# Patient Record
Sex: Male | Born: 1945 | Race: Black or African American | Hispanic: No | Marital: Single | Smoking: Never smoker
Health system: Southern US, Community
[De-identification: ages and names within clinical notes are randomized; demographics above are authoritative.]

## PROBLEM LIST (undated history)

## (undated) DIAGNOSIS — K922 Gastrointestinal hemorrhage, unspecified: Secondary | ICD-10-CM

## (undated) DIAGNOSIS — F0390 Unspecified dementia without behavioral disturbance: Secondary | ICD-10-CM

## (undated) DIAGNOSIS — E785 Hyperlipidemia, unspecified: Secondary | ICD-10-CM

## (undated) DIAGNOSIS — R4701 Aphasia: Secondary | ICD-10-CM

## (undated) DIAGNOSIS — N400 Enlarged prostate without lower urinary tract symptoms: Secondary | ICD-10-CM

## (undated) DIAGNOSIS — G8190 Hemiplegia, unspecified affecting unspecified side: Secondary | ICD-10-CM

## (undated) DIAGNOSIS — I429 Cardiomyopathy, unspecified: Secondary | ICD-10-CM

## (undated) DIAGNOSIS — I639 Cerebral infarction, unspecified: Secondary | ICD-10-CM

## (undated) DIAGNOSIS — D649 Anemia, unspecified: Secondary | ICD-10-CM

## (undated) DIAGNOSIS — R4702 Dysphasia: Secondary | ICD-10-CM

## (undated) DIAGNOSIS — I10 Essential (primary) hypertension: Secondary | ICD-10-CM

## (undated) DIAGNOSIS — K209 Esophagitis, unspecified without bleeding: Secondary | ICD-10-CM

## (undated) DIAGNOSIS — K219 Gastro-esophageal reflux disease without esophagitis: Secondary | ICD-10-CM

## (undated) DIAGNOSIS — Z978 Presence of other specified devices: Secondary | ICD-10-CM

## (undated) DIAGNOSIS — N1330 Unspecified hydronephrosis: Secondary | ICD-10-CM

## (undated) DIAGNOSIS — I509 Heart failure, unspecified: Secondary | ICD-10-CM

## (undated) DIAGNOSIS — Z8719 Personal history of other diseases of the digestive system: Secondary | ICD-10-CM

## (undated) DIAGNOSIS — E119 Type 2 diabetes mellitus without complications: Secondary | ICD-10-CM

## (undated) DIAGNOSIS — I499 Cardiac arrhythmia, unspecified: Secondary | ICD-10-CM

## (undated) DIAGNOSIS — Z960 Presence of urogenital implants: Secondary | ICD-10-CM

## (undated) HISTORY — PX: PEG PLACEMENT: SHX5437

## (undated) HISTORY — PX: OTHER SURGICAL HISTORY: SHX169

---

## 2004-07-20 ENCOUNTER — Emergency Department (HOSPITAL_COMMUNITY): Admission: EM | Admit: 2004-07-20 | Discharge: 2004-07-21 | Payer: Self-pay | Admitting: Emergency Medicine

## 2004-07-22 ENCOUNTER — Inpatient Hospital Stay (HOSPITAL_COMMUNITY): Admission: EM | Admit: 2004-07-22 | Discharge: 2004-08-02 | Payer: Self-pay | Admitting: Emergency Medicine

## 2006-01-24 ENCOUNTER — Ambulatory Visit: Admission: RE | Admit: 2006-01-24 | Discharge: 2006-01-24 | Payer: Self-pay | Admitting: Internal Medicine

## 2006-01-24 ENCOUNTER — Encounter: Payer: Self-pay | Admitting: Vascular Surgery

## 2006-02-25 ENCOUNTER — Encounter: Payer: Self-pay | Admitting: Vascular Surgery

## 2006-02-25 ENCOUNTER — Ambulatory Visit (HOSPITAL_COMMUNITY): Admission: RE | Admit: 2006-02-25 | Discharge: 2006-02-25 | Payer: Self-pay | Admitting: *Deleted

## 2007-10-06 ENCOUNTER — Emergency Department (HOSPITAL_COMMUNITY): Admission: EM | Admit: 2007-10-06 | Discharge: 2007-10-06 | Payer: Self-pay | Admitting: Emergency Medicine

## 2007-10-10 ENCOUNTER — Inpatient Hospital Stay (HOSPITAL_COMMUNITY): Admission: EM | Admit: 2007-10-10 | Discharge: 2007-10-24 | Payer: Self-pay | Admitting: Emergency Medicine

## 2007-10-20 ENCOUNTER — Ambulatory Visit: Payer: Self-pay | Admitting: Internal Medicine

## 2009-12-10 ENCOUNTER — Emergency Department (HOSPITAL_COMMUNITY): Admission: EM | Admit: 2009-12-10 | Discharge: 2009-12-10 | Payer: Self-pay | Admitting: Emergency Medicine

## 2010-01-09 ENCOUNTER — Observation Stay (HOSPITAL_COMMUNITY): Admission: EM | Admit: 2010-01-09 | Discharge: 2010-01-17 | Payer: Self-pay | Admitting: Emergency Medicine

## 2010-01-15 ENCOUNTER — Encounter (INDEPENDENT_AMBULATORY_CARE_PROVIDER_SITE_OTHER): Payer: Self-pay | Admitting: Internal Medicine

## 2010-01-16 ENCOUNTER — Encounter (INDEPENDENT_AMBULATORY_CARE_PROVIDER_SITE_OTHER): Payer: Self-pay | Admitting: Internal Medicine

## 2010-12-04 LAB — CBC
HCT: 24.3 % — ABNORMAL LOW (ref 39.0–52.0)
HCT: 22.8 % — ABNORMAL LOW (ref 39.0–52.0)
HCT: 23.4 % — ABNORMAL LOW (ref 39.0–52.0)
HCT: 24.5 % — ABNORMAL LOW (ref 39.0–52.0)
HCT: 28.3 % — ABNORMAL LOW (ref 39.0–52.0)
Hemoglobin: 7.6 g/dL — ABNORMAL LOW (ref 13.0–17.0)
MCHC: 32.3 g/dL (ref 30.0–36.0)
MCHC: 32.5 g/dL (ref 30.0–36.0)
MCHC: 31.8 g/dL (ref 30.0–36.0)
MCHC: 31.9 g/dL (ref 30.0–36.0)
MCHC: 32.3 g/dL (ref 30.0–36.0)
MCV: 70.8 fL — ABNORMAL LOW (ref 78.0–100.0)
MCV: 68.8 fL — ABNORMAL LOW (ref 78.0–100.0)
MCV: 68.9 fL — ABNORMAL LOW (ref 78.0–100.0)
MCV: 68.9 fL — ABNORMAL LOW (ref 78.0–100.0)
MCV: 69 fL — ABNORMAL LOW (ref 78.0–100.0)
MCV: 69.1 fL — ABNORMAL LOW (ref 78.0–100.0)
MCV: 69.2 fL — ABNORMAL LOW (ref 78.0–100.0)
MCV: 71 fL — ABNORMAL LOW (ref 78.0–100.0)
Platelets: 235 10*3/uL (ref 150–400)
Platelets: 247 10*3/uL (ref 150–400)
Platelets: 264 10*3/uL (ref 150–400)
Platelets: 235 10*3/uL (ref 150–400)
Platelets: 235 10*3/uL (ref 150–400)
RBC: 3.84 MIL/uL — ABNORMAL LOW (ref 4.22–5.81)
RBC: 3.4 MIL/uL — ABNORMAL LOW (ref 4.22–5.81)
RBC: 3.54 MIL/uL — ABNORMAL LOW (ref 4.22–5.81)
RBC: 3.61 MIL/uL — ABNORMAL LOW (ref 4.22–5.81)
RBC: 3.76 MIL/uL — ABNORMAL LOW (ref 4.22–5.81)
RBC: 3.81 MIL/uL — ABNORMAL LOW (ref 4.22–5.81)
RBC: 3.84 MIL/uL — ABNORMAL LOW (ref 4.22–5.81)
RBC: 4.1 MIL/uL — ABNORMAL LOW (ref 4.22–5.81)
RDW: 17.8 % — ABNORMAL HIGH (ref 11.5–15.5)
RDW: 18.5 % — ABNORMAL HIGH (ref 11.5–15.5)
RDW: 15.7 % — ABNORMAL HIGH (ref 11.5–15.5)
RDW: 15.9 % — ABNORMAL HIGH (ref 11.5–15.5)
WBC: 6.4 10*3/uL (ref 4.0–10.5)
WBC: 5.6 10*3/uL (ref 4.0–10.5)
WBC: 5.7 10*3/uL (ref 4.0–10.5)
WBC: 6.1 10*3/uL (ref 4.0–10.5)
WBC: 7.4 10*3/uL (ref 4.0–10.5)
WBC: 9.3 10*3/uL (ref 4.0–10.5)

## 2010-12-04 LAB — BASIC METABOLIC PANEL
BUN: 10 mg/dL (ref 6–23)
CO2: 23 mEq/L (ref 19–32)
CO2: 26 mEq/L (ref 19–32)
Calcium: 8.3 mg/dL — ABNORMAL LOW (ref 8.4–10.5)
Calcium: 8.6 mg/dL (ref 8.4–10.5)
Chloride: 111 mEq/L (ref 96–112)
Creatinine, Ser: 1.11 mg/dL (ref 0.4–1.5)
GFR calc non Af Amer: >60 mL/min (ref >60)
Glucose, Bld: 105 mg/dL — ABNORMAL HIGH (ref 70–99)
Glucose, Bld: 111 mg/dL — ABNORMAL HIGH (ref 70–99)
Potassium: 3.9 mEq/L (ref 3.5–5.1)
Sodium: 141 mEq/L (ref 135–145)

## 2010-12-04 LAB — CROSSMATCH

## 2010-12-04 LAB — ABO/RH: ABO/RH(D): B POS

## 2010-12-04 LAB — COMPREHENSIVE METABOLIC PANEL
ALT: 10 U/L (ref 0–53)
CO2: 23 mEq/L (ref 19–32)
Calcium: 8.9 mg/dL (ref 8.4–10.5)
Chloride: 109 mEq/L (ref 96–112)
Creatinine, Ser: 1.35 mg/dL (ref 0.4–1.5)
GFR calc non Af Amer: 53 mL/min — ABNORMAL LOW (ref >60)
Glucose, Bld: 124 mg/dL — ABNORMAL HIGH (ref 70–99)
Total Bilirubin: 0.3 mg/dL (ref 0.3–1.2)

## 2010-12-04 LAB — DIFFERENTIAL
Basophils Absolute: 0 10*3/uL (ref 0.0–0.1)
Eosinophils Absolute: 0.1 10*3/uL (ref 0.0–0.7)
Lymphocytes Relative: 8 % — ABNORMAL LOW (ref 12–46)
Lymphs Abs: 0.7 10*3/uL (ref 0.7–4.0)
Monocytes Relative: 8 % (ref 3–12)
Neutro Abs: 7.8 10*3/uL — ABNORMAL HIGH (ref 1.7–7.7)

## 2010-12-04 LAB — TYPE AND SCREEN: ABO/RH(D): B POS

## 2010-12-09 LAB — URINALYSIS, ROUTINE W REFLEX MICROSCOPIC
Bilirubin Urine: NEGATIVE
Glucose, UA: NEGATIVE mg/dL
Ketones, ur: NEGATIVE mg/dL
Leukocytes, UA: NEGATIVE
Protein, ur: NEGATIVE mg/dL

## 2010-12-09 LAB — DIFFERENTIAL
Lymphs Abs: 0.9 10*3/uL (ref 0.7–4.0)
Monocytes Relative: 9 % (ref 3–12)
Neutro Abs: 7 10*3/uL (ref 1.7–7.7)
Neutrophils Relative %: 78 % — ABNORMAL HIGH (ref 43–77)

## 2010-12-09 LAB — COMPREHENSIVE METABOLIC PANEL
BUN: 23 mg/dL (ref 6–23)
Calcium: 8.9 mg/dL (ref 8.4–10.5)
Creatinine, Ser: 1.2 mg/dL (ref 0.4–1.5)
GFR calc non Af Amer: >60 mL/min (ref >60)
Glucose, Bld: 115 mg/dL — ABNORMAL HIGH (ref 70–99)
Total Protein: 7.5 g/dL (ref 6.0–8.3)

## 2010-12-09 LAB — CBC
HCT: 34.3 % — ABNORMAL LOW (ref 39.0–52.0)
Hemoglobin: 11.1 g/dL — ABNORMAL LOW (ref 13.0–17.0)
MCHC: 32.3 g/dL (ref 30.0–36.0)
MCV: 71.3 fL — ABNORMAL LOW (ref 78.0–100.0)
RDW: 15 % (ref 11.5–15.5)

## 2010-12-09 LAB — LIPASE, BLOOD: Lipase: 36 U/L (ref 11–59)

## 2011-01-15 ENCOUNTER — Emergency Department (HOSPITAL_COMMUNITY): Payer: Medicare Other

## 2011-01-15 ENCOUNTER — Emergency Department (HOSPITAL_COMMUNITY)
Admission: EM | Admit: 2011-01-15 | Discharge: 2011-01-15 | Disposition: A | Discharge status: Home / Self Care | Payer: Medicare Other | Attending: Emergency Medicine | Admitting: Emergency Medicine

## 2011-01-15 DIAGNOSIS — Z79899 Other long term (current) drug therapy: Secondary | ICD-10-CM | POA: Insufficient documentation

## 2011-01-15 DIAGNOSIS — E785 Hyperlipidemia, unspecified: Secondary | ICD-10-CM | POA: Insufficient documentation

## 2011-01-15 DIAGNOSIS — F068 Other specified mental disorders due to known physiological condition: Secondary | ICD-10-CM | POA: Insufficient documentation

## 2011-01-15 DIAGNOSIS — K219 Gastro-esophageal reflux disease without esophagitis: Secondary | ICD-10-CM | POA: Insufficient documentation

## 2011-01-15 DIAGNOSIS — R1915 Other abnormal bowel sounds: Secondary | ICD-10-CM | POA: Insufficient documentation

## 2011-01-15 DIAGNOSIS — Z8673 Personal history of transient ischemic attack (TIA), and cerebral infarction without residual deficits: Secondary | ICD-10-CM | POA: Insufficient documentation

## 2011-01-15 DIAGNOSIS — R112 Nausea with vomiting, unspecified: Secondary | ICD-10-CM | POA: Insufficient documentation

## 2011-01-15 DIAGNOSIS — Z7982 Long term (current) use of aspirin: Secondary | ICD-10-CM | POA: Insufficient documentation

## 2011-01-15 DIAGNOSIS — R109 Unspecified abdominal pain: Secondary | ICD-10-CM | POA: Insufficient documentation

## 2011-01-15 LAB — URINALYSIS, ROUTINE W REFLEX MICROSCOPIC
Bilirubin Urine: NEGATIVE
Glucose, UA: NEGATIVE mg/dL
Hgb urine dipstick: NEGATIVE
Ketones, ur: NEGATIVE mg/dL
Protein, ur: NEGATIVE mg/dL
pH: 6 (ref 5.0–8.0)

## 2011-01-15 LAB — COMPREHENSIVE METABOLIC PANEL WITH GFR
ALT: 16 U/L (ref 0–53)
AST: 15 U/L (ref 0–37)
Albumin: 3.5 g/dL (ref 3.5–5.2)
Alkaline Phosphatase: 51 U/L (ref 39–117)
BUN: 17 mg/dL (ref 6–23)
CO2: 19 meq/L (ref 19–32)
Calcium: 9.4 mg/dL (ref 8.4–10.5)
Chloride: 110 meq/L (ref 96–112)
Creatinine, Ser: 1.46 mg/dL (ref 0.4–1.5)
GFR calc non Af Amer: 48 mL/min — ABNORMAL LOW
Glucose, Bld: 126 mg/dL — ABNORMAL HIGH (ref 70–99)
Potassium: 4.3 meq/L (ref 3.5–5.1)
Sodium: 137 meq/L (ref 135–145)
Total Bilirubin: 0.3 mg/dL (ref 0.3–1.2)
Total Protein: 7 g/dL (ref 6.0–8.3)

## 2011-01-15 LAB — CBC
HCT: 39.7 % (ref 39.0–52.0)
Hemoglobin: 12.8 g/dL — ABNORMAL LOW (ref 13.0–17.0)
RDW: 17.7 % — ABNORMAL HIGH (ref 11.5–15.5)
WBC: 7.1 10*3/uL (ref 4.0–10.5)

## 2011-01-15 LAB — OCCULT BLOOD, POC DEVICE: Fecal Occult Bld: NEGATIVE

## 2011-01-29 NOTE — Discharge Summary (Signed)
NAME:  [name], [name]              ACCOUNT NO.:  [id number]   MEDICAL RECORD NO.:  [id number]          PATIENT TYPE:  INP   LOCATION:  5526                         FACILITY:  [facility]   PHYSICIAN:  Lonia Blood, M.D.       DATE OF BIRTH:  June 05, 1946   DATE OF ADMISSION:  10/09/2007  DATE OF DISCHARGE:                               DISCHARGE SUMMARY   INTERIM DISCHARGE SUMMARY   PRIMARY CARE PHYSICIAN:  Dr. Jacalyn Lefevre   DISCHARGE DIAGNOSES:  1. Acute pyelonephritis with Proteus mirabilis and Enterococcus      faecalis.  2. Chronic left hydronephrosis.  3. Nausea and vomiting, recurrent and intractable, unclear etiology.  4. Ileus.  5. Hypertension.  6. History of cerebrovascular accident with chronic right hemiparesis.  7. Hypertension.  8. Hyperlipidemia.  9. Gastroesophageal reflux disease.  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  11.Bilateral lower lobe atelectasis.   DISCHARGE MEDICATIONS AND DISPOSITION:  Will be dictated at the time of  the actual discharge of the patient.   PROCEDURES DURING THIS ADMISSION:  1. October 09, 2007, the patient underwent CT scan of abdomen and      pelvis, findings of left-sided hydronephrosis and left renal      atrophy, bladder outlet obstruction with thickening of the bladder      wall.  2. October 11, 2007, abdominal x-ray, findings of nonspecific bowel-      gas pattern.  3. October 12, 2007, a nuclear medicine gastric emptying scan, mildly      delayed.  4. October 13, 2007, abdominal x-ray with findings of severely dilated      stomach.  5. October 15, 2007, placement of a nasogastric tube with relief of      the gastric distention.  6. October 16, 2007, upper endoscopy by Dr. Virginia Rochester, report pending.   CONSULTATIONS DURING THIS ADMISSION:  The patient was seen in  consultation by Dr. Virginia Rochester from gastroenterology and Dr. Marcine Matar  from urology.   HISTORY AND PHYSICAL:  Refer to  the dictated H&P which was done by Dr.  Christella Noa October 09, 2007.   HOSPITAL COURSE:  1. Acute pyelonephritis in a patient with chronic left hydronephrosis.      Mr. Tyler had urine culture which was positive for Proteus as      well as Enterococcus.  The patient was treated initially      empirically with vancomycin and Rocephin and later, once the      results of the urine culture studies were available, he was      switched to Levaquin intravenously.  The patient is still receiving      this antibiotic at the time of my dictation.  His total planned      treatment for intravenous antibiotics is at least 10 days, out of      which he already has completed 6.  2. Recurrent abdominal distention, nausea, vomiting and gastric      distention.  This has been unclear in etiology.  The patient's  potassium has been always within normal limits.  The patient does      not seem to have a bowel obstruction.  The patient underwent upper      endoscopy which did not reveal any pyloric stenosis.  Currently,      Mr. Curro is on Reglan intravenously around-the-clock and we are      awaiting resolution of his symptoms.  If he does not improve, he      will probably require PEJ placement.  3. History of stroke.  This is a chronic problem for Mr. Lewellyn for      which he is in a skilled nursing facility.  4. Bladder outlet obstruction.  Mr. Keetch had a Foley catheter      placed and Dr. Retta Diones is contemplating the right timing of a      TURP procedure.  The patient's PSA level is pending at the time of      my dictation.      Lonia Blood, M.D.  Electronically Signed     SL/MEDQ  D:  10/16/2007  T:  10/17/2007  Job:  [id number]   cc:   [facility]  Georgiana Spinner, M.D.  Bertram Millard. Dahlstedt, M.D.

## 2011-01-29 NOTE — Op Note (Signed)
NAME:  [name], [name] NO.:  [id number]   MEDICAL RECORD NO.:  [id number]          PATIENT TYPE:  INP   LOCATION:  5526                         FACILITY:  [facility]   PHYSICIAN:  Georgiana Spinner, M.D.    DATE OF BIRTH:  1946-08-19   DATE OF PROCEDURE:  10/16/2007  DATE OF DISCHARGE:                               OPERATIVE REPORT   PROCEDURE:  Upper endoscopy.   INDICATIONS:  Vomiting, question of gastroparesis versus gastric outlet  obstruction.   ANESTHESIA:  Fentanyl 75 mcg, Versed 7.5 mg.   PROCEDURE IN DETAIL:  With the patient mildly sedated in the left  lateral decubitus position the Pentax videoscopic endoscope was inserted  in the mouth, passed under direct vision into the esophagus which  appeared normal, into the stomach which appeared dilated.  The fundus,  body and antrum appeared normal as did the prepyloric area.  We were  able then to advance through the pylorus into the duodenal bulb and  second portion of the duodenum, again both which appeared normal.  From  this point the endoscope was then slowly withdrawn taking  circumferential views of duodenal mucosa until the endoscope had been  pulled back into the stomach, placed in retroflexion to view the stomach  from below.  The endoscope was straightened and withdrawn putting  pressure on the NG tube to remain in place as we withdrew the endoscope.  The patient's vital signs, pulse oximeter remained stable.  The patient  tolerated the procedure well without apparent complication.   FINDINGS:  Negative examination indicating that the appearance of  gastric outlet obstruction is most likely due to gastroparesis.   PLAN:  Will get an x-ray to make sure NG tube remains in good position.  Increase dose of metoclopramide and consider further therapies depending  on the patient's clinical response.           ______________________________  Georgiana Spinner, M.D.     GMO/MEDQ  D:  10/16/2007  T:   10/16/2007  Job:  [id number]

## 2011-01-29 NOTE — Discharge Summary (Signed)
NAME:  [name], [name]              ACCOUNT NO.:  [id number]   MEDICAL RECORD NO.:  [id number]          PATIENT TYPE:  INP   LOCATION:  5526                         FACILITY:  [facility]   PHYSICIAN:  Lonia Blood, M.D.      DATE OF BIRTH:  1946/03/12   DATE OF ADMISSION:  10/09/2007  DATE OF DISCHARGE:  10/24/2007                               DISCHARGE SUMMARY   ADDENDUM   PRIMARY CARE PHYSICIAN:  Immunologist Care, Dr. Hyacinth Meeker.   Please refer to dictated discharge summary by Dr. Lonia Blood on October 16, 2007.   DISCHARGE MEDICATIONS:  1. Aspirin 81 mg daily.  2. Chlorpromazine 25 mg p.o. t.i.d.  3. Norvasc 10 mg daily.  4. Labetalol 200 mg p.o. b.i.d.  5. Niacin 500 mg p.o. q.h.s.  6. Resource nutritional supplement 240 mg t.i.d.  7. Zantac 150 mg p.o. b.i.d.   DISPOSITION:  The patient will be transferred to skilled facility today.  He is tolerating diet well.  He has some baseline stool incontinence but  that seems to be chronic.  He is otherwise medically stable and no  complaints.  His ileus was persistent and the patient was deemed to have  been discharged 2 days ago but continued to have food intolerance.  Today, however, he is tolerating a mechanical soft diet which he should  continue with in the nursing home.      Lonia Blood, M.D.  Electronically Signed     LG/MEDQ  D:  10/24/2007  T:  10/24/2007  Job:  [id number]

## 2011-01-29 NOTE — H&P (Signed)
NAME:  [name], [name] NO.:  [id number]   MEDICAL RECORD NO.:  [id number]          PATIENT TYPE:  INP   LOCATION:  5524                         FACILITY:  [facility]   PHYSICIAN:  Herbie Saxon, MDDATE OF BIRTH:  1945/11/03   DATE OF ADMISSION:  10/09/2007  DATE OF DISCHARGE:                              HISTORY & PHYSICAL   PRIMARY CARE PHYSICIAN:  Dr. Jacalyn Lefevre   NURSING HOME PHYSICIAN:  Dr. Corine Shelter   GASTROENTEROLOGIST:  Dr. Virginia Rochester   UROLOGIST:  Dr. Retta Diones   PRIMARY CAREGIVER:  Meriam Sprague  phone number [phone].   The patient is a full code.   PRESENTING COMPLAINT:  Nausea and vomiting 3 days.   HISTORY OF PRESENTING COMPLAINT:  This is a 65 year old African American  male who is a nursing home resident at [facility] who has been  noticing to be having continuous nausea and vomiting in the last 3 days  associated with cough productive of yellowish-greenish phlegm.  The  patient has a complaint of mild urinary discomfort.  He denies any  hematuria but does have dysuria and frequent urination.  The patient  also has lower abdominal pain discomfort.  He denies any diarrhea.  He  is chronically constipated.  No fever or chills.  He has dysarthria  status post his stroke.  The patient denies any shortness of breath or  wheezing or palpitation.  No seizure activity.  No melena stool or  hematemesis.   PAST MEDICAL HISTORY:  1. Cerebrovascular accident.  2. Bowel ileus.  3. Urinary tract infection.  4. Staphylococcus aureus septicemia.  5. Renal insufficiency.  6. Left kidney hydronephrosis.  7. Hypertension.  8. Dyslipidemia.  9. Gastroparesis.  10.Gastroesophageal reflux disease.   FAMILY HISTORY:  Hypertension.   PAST MEDICAL HISTORY:  As stated above.   SOCIAL HISTORY:  Resides at a nursing home.  No history of alcohol or  tobacco abuse.   TWELVE-SYSTEM REVIEW:  Pertinent positives as earlier stated.    MEDICATIONS:  1. Prilosec 20 mg daily.  2. Norvasc 10 mg daily.  3. Aspirin 81 mg daily.  4. Mobic one tablet b.i.d.  5. Lactulose 30 mL b.i.d.  6. Labetalol 200 mg b.i.d.  7. Reglan 10 mg b.i.d.  8. Flomax 0.4 mg q.h.s.  9. Niaspan 1000 mg q.h.s.  10.Chlorhexidine 15 mL rinses t.i.d.  11.Tylenol 650 mg q.6h. p.r.n.   No known drug allergies.   EXAMINATION:  He is an elderly man, chronically-ill-looking.  Temperature is 98, initial temperature was 100.8.  Pulse is 112,  respiratory rate 20, blood pressure 140/90.  Pupils equal, reactive to  light and accommodation.  NECK:  Supple.  HEAD:  Atraumatic, normocephalic.  He has dysarthria.  He has right  hemiplegia.  CHEST:  He has bibasilar rales.  Heart sounds 1 and 2, tachycardic.  ABDOMEN:  Distended.  Suprapubic tenderness, which is mild.  He is alert, oriented x2.  Peripheral pulses present, no edema.   Labs reveal WBC of 6, hematocrit 41, platelet count 204.  Chemistry:  Sodium is 145, potassium 3.8, chloride 114, BUN is  25, creatinine is  1.7.  Urinalysis shows small leukocyte esterase, many bacteria.  The CT  of abdomen and pelvis shows left-sided hydroureteronephrosis with left  renal atrophy, bibasal collapse and consolidation in lower lobes, stable  small pericardial effusion.  The left ureter is dilated into the  anatomic pelvis.  There is marked bladder wall thickening and  accompanying bladder outlet obstruction.  Slightly prominent prostate  gland.   ASSESSMENT:  1. Urinary tract infection.  2. Rule out underlying bibasilar pneumonia.  3. Mild renal insufficiency.  4. Bladder outlet obstruction.   The patient is to be admitted to the medical-surgical floor.  Will get  blood cultures, urine cultures.  Obtain a chest x-ray and EKG,  coagulation parameters.  Obtain a urology evaluation in the morning.  IV  Rocephin and Zithromax.  Continue his home medications.  Reduce his  Niaspan dose to 500 mg daily.  Put him  on Lovenox 30 mg subcu daily and  Protonix 40 mg IV daily with Phenergan 12.5 mg IV q.8h. p.r.n.  alternating with Reglan 10 mg IV q.6h. p.r.n.  IV fluids D5-half normal saline at 40 mL an hour.  Will get PT, speech  therapy and occupational therapy to evaluate him.   Clarify his code status with his primary caregiver, Meriam Sprague, in a.m.      Herbie Saxon, MD  Electronically Signed     MIO/MEDQ  D:  10/09/2007  T:  10/10/2007  Job:  [id number]   cc:   Georgiana Spinner, M.D.  Bertram Millard. Dahlstedt, M.D.  Mina Marble, M.D.  Bertram Millard. Hyacinth Meeker, M.D.

## 2011-02-01 NOTE — Consult Note (Signed)
[name], [name]              ACCOUNT NO.:  [id number]   MEDICAL RECORD NO.:  [id number]          PATIENT TYPE:  INP   LOCATION:  0463                         FACILITY:  [facility]   PHYSICIAN:  Bertram Millard. Dahlstedt, M.D.DATE OF BIRTH:  07/31/1944   DATE OF CONSULTATION:  07/23/2004  DATE OF DISCHARGE:                                   CONSULTATION   UROLOGY CONSULTATION:   REASON FOR CONSULTATION:  Left hydronephrosis.   BRIEF HISTORY:  [age]-year-old male who was admitted a couple of days ago  with abdominal pain and nausea and vomiting.  He presented to the emergency  room with the aforementioned symptoms.  This gentleman is hard to interview  as he has dysarthria secondary to a left CVA experienced several years ago.   According to the patient, as much history as he can give, he denies any  prior urologic history.  He has questionable incontinence at home.  He seems  to insinuate that he does wear a diaper.  He denies having to use a catheter  that much.   In the emergency room evaluation included a urinalysis which revealed  infection.  At this point his culture has grown out Staphylococcus aureus  which is fairly sensitive.  He has an indwelling Foley.   CT abdomen and pelvis was performed without IV contrast, as the patient had  an elevated creatinine.  This showed a normal appearing right kidney but  left hydronephrosis.  He had a hydroureter down to the pelvis.  No pelvic  mass or stone was seen.  The bladder was quite thick walled in a uniform  distribution.  A Foley catheter was in place.   Urologic consultation is requested at this point.   PHYSICAL EXAMINATION:  Pleasant elderly male.  He had difficulty with  speech.  He was in no acute distress.  His abdomen was flat and somewhat  muscular.  He had no masses or megaly.  He had mild left lower quadrant and  left CVA tenderness.  There was no rebound or guarding.  He had no flank  masses.  His bladder was  nonpalpable.  His phallus was circumcised.  There  were no penile plaques.  There were no fibrotic areas along his penis.  His  glans was normal.  Foley catheter was present at his urethral meatus.  His  scrotal skin was unremarkable.  Testicles were atrophic.  They were  descended bilaterally and nontender.  He had normal anal sphincter tone.  Glan was 2+, symmetrical.  He had no nodules or tenderness.  There were no  rectal masses.  Seminal vesicles were nonpalpable.   IMPRESSION:  1.  Staphylococcus aureus urinary tract infection.  Patient is on Zosyn but      his bug is sensitive to most oral antibiotics (i.e., Cipro, Levaquin).  2.  Left hydronephrosis.  He has no evident ureteral stones.  Etiologies      could include a neurogenic, high pressure bladder with resultant      decreased left ureteral draining.  It also could include bladder tumor  or an extra vesicle obstruction.  3.  Acute renal insufficiency, resolving.   RECOMMENDATIONS:  1.  At this point would continue antibiotics for at least 10 days.  I think      it would be appropriate to switch to an oral antibiotic when he can      adequately take p.o. substances.  2.  Patient will need cystoscopy and possible retrograde pyelogram.  I would      not attempt to do this until he has been on his antibiotic several days.      In a perfect world, I would like to wait at least a week, as long as the      patient is responding to antibiotics and not having spiking fevers.  If      he does not get better quickly, I think a percutaneous drainage of that      left renal unit would be appropriate.  3.  I will continue to follow [name] during his hospitalization.  If      he gets better quickly, I think it is worthwhile to let him go home on      oral antibiotic and have him come back for an outpatient evaluation.      SMD/MEDQ  D:  07/23/2004  T:  07/23/2004  Job:  [id number]   cc:   Eino Farber., M.D.  [address] [place]  [place] [address]  Fax: [phone]

## 2011-02-01 NOTE — Discharge Summary (Signed)
NAME:  [name]              ACCOUNT NO.:  [id number]   MEDICAL RECORD NO.:  [id number]          PATIENT TYPE:  INP   LOCATION:  0463                         [facility]ACILITY[facility]  [facility]   PHYSICIAN:  Eino Farber., M.D.DATE OF BIRTH:  07/31/1944   DATE OF ADMISSION:  07/22/2004  DATE OF DISCHARGE:  08/02/2004                                 DISCHARGE SUMMARY   DISCHARGE DIAGNOSES:  1.  Bowel ileus with uncontrolled nausea and vomiting and abdominal pain.  2.  Urinary tract infection with Staphylococcus aureus sensitive to      cephalosporins.  3.  Septicemia with Staphylococcus aureus.  4.  Renal insufficiency, moderate, treated, improved.  5.  Hydronephrosis of the left kidney, etiology undetermined, improved with      treatment for urinary tract infection, evaluation still in progress.  6.  Status post infarct cerebrovascular accident with right weakness.  7.  Hypertensive heart disease requiring medication for control.  8.  Dyslipidemia with severely low HDL of 18.  9.  Dehydration.  10. Gastroparesis.   BRIEF HISTORY:  [name] initially was brought  to the [facility] Emergency Department with the  complaint of recurrent nausea and vomiting with increasing weakness.  He  also had mid and epigastric abdominal pain.  The patient had been declining  in health over approximately five days.  The patient was found in the  emergency department to have elevation in his renal function values as well  as markedly abnormal urinalysis.  His BUN was elevated to 58 mg percent with  a serum creatinine of 2.9 mg percent and his urinalysis revealed several  leukocytes and bacteria with his WBC count in the urine being too numerous  to count with WBC comps.  The patient was admitted for further evaluation.  He has a long history of infarct cerebrovascular accident from over 10 years  ago with right-sided hemiparesis.   PAST MEDICAL  HISTORY:  1.  The patient having the old infarct cerebrovascular accident with right-      sided weakness.  2.  Long history of hypertension.   FAMILY HISTORY:  The patient has a family history also of hypertension and  he had been living with a brother and sister-in-law, but at this time his  condition became too weak for them to care for him.   SOCIAL HISTORY:  As noted above.   REVIEW OF SYSTEMS:  Revealed the head, eyes, ears, nose and throat,  cardiopulmonary, neurovascular system to be as noted.   PERTINENT FINDINGS ON ADMISSION:  Revealed the patient to have a temperature  of 98.2, pulse 119 and regular, respirations 20, and blood pressure 158/92.  Examination of the head, eyes, ears, nose and throat revealed the patient to  be appear weak and dehydrated.  The patients speech is slurred with  dysarthria.  NECK:  Supple with no masses.  LUNGS:  Clear.  HEART:  Regular rhythm with no rubs.  ABDOMEN:  Revealed moderate epigastric tenderness with some left-sided CVA  tenderness.  EXTREMITIES:  Revealed marked right-sided weakness with spasticity.  LABORATORY DATA:  The patients laboratory data on admission was as noted.  The patient received IV fluids and after hydration over several days his BUN  came down to 20 mg percent with a serum creatinine of 1.8 mg percent still  remaining elevated.  He did have a slightly elevated serum glucose of 130 mg  percent, but he is receiving IV fluids with some dextrose.  His renal  failure and comprehensive metabolic status will continue to be monitored as  an outpatient.  The patients urine culture revealed Staphylococcus aureus  sensitive to cephalosporin.  The patient had x-ray findings with a CT scan  that revealed bowel ileus that ended up being more a colonic ileus.  The  patient also was found to have a left hydronephrosis, which slowly resolved.  He had an attempted ultrasound-guided left renal needle aspiration and tube  placement  for a nephrostomy, but the hydronephrosis had improved and was not  able to establish a nephrostomy tube.  His Foley catheter is being continued  and maintained to continue to drain the urine.  This was evaluated by the  Urologist Patsi Sears and will be followed up as an outpatient.  The  patient was also seen on consult by the Gastroenterologist Sabino Gasser, M.D.  for his persistent slowly resolving bowel ileus, and this was managed  conservatively by observation and a rectal tube which has gradually  improved.  He also has some fecal impaction that is improving on medication.  The patient clinically also has gastroparesis that improved with IV Reglan.  The patients nausea and vomiting gradually cleared with the IV Reglan.  The  patient was placed on Zosyn initially as an antibiotic which was also  effective, but after the staph aureus was found to be sensitive to be  cephalosporin, he was changed to Ancef 1 gram IV q.8 hours with continued  improvement in his leukocytosis and the repeat urine culture was sterile.  The patient is being discharged to [facility]  under my care Corine Shelter, Montez Hageman., M.D. for further treatment.  He will  also have a followup as an outpatient with Patsi Sears, M.D. Urologist.  Please schedule an appointment with the Urology Center for further  evaluation in approximately two weeks.  The patient also received deep  venous thrombosis prophylaxis with Lovenox 40 mg subcu daily.  The patient  is presently more mobile and is up in a bedside chair daily.   DISCHARGE MEDICATIONS:  1.  The patient will be restarted on his aspirin 81 mg p.o. daily.  2.  Protonix 40 mg p.o. continued daily.  3.  Lactulose 30 mL q. a.m. and q.h.s. unless loose stools.  4.  Labetalol 200 mg p.o. b.i.d.  5.  Norvasc 10 mg p.o. daily, but hold if systolic BP is below 110.  6.  Reglan 10 mg p.o. q. a.m. and q.h.s. 7.  Keflex 500 mg p.o. q.8 hours  to continue for two weeks.  8.  Tylenol 650 mg p.o. q.4 hours p.r.n. for pain or headache.  9.  Niaspan 500 mg tablet 1 p.o. q.h.s. with applesauce.  The patient did receive his flu vaccine before discharge.  The chest x-ray  revealed some  subsegmental atelectasis during this hospital course in the  bases, but no evidence of active infection.      GRK/MEDQ  D:  08/02/2004  T:  08/02/2004  Job:  [id number]   cc:   Bertram Millard. Dahlstedt, M.D.  [address]  [place]  [place] [address]  Fax: [phone]

## 2011-06-07 LAB — BASIC METABOLIC PANEL
BUN: 14
BUN: 20
BUN: 7
CO2: 21
CO2: 23
CO2: 24
CO2: 22
Calcium: 8 — ABNORMAL LOW
Calcium: 8.5
Calcium: 8.9
Chloride: 109
Chloride: 112
Chloride: 114 — ABNORMAL HIGH
Creatinine, Ser: 1.1
Creatinine, Ser: 1.2
Creatinine, Ser: 1.21
GFR calc Af Amer: >60
GFR calc Af Amer: >60
GFR calc Af Amer: >60
GFR calc non Af Amer: 56 — ABNORMAL LOW
GFR calc non Af Amer: >60
GFR calc non Af Amer: >60
GFR calc non Af Amer: 60 — ABNORMAL LOW
Glucose, Bld: 127 — ABNORMAL HIGH
Glucose, Bld: 143 — ABNORMAL HIGH
Potassium: 3.4 — ABNORMAL LOW
Potassium: 3.8
Potassium: 3.8
Sodium: 138
Sodium: 145
Sodium: 141

## 2011-06-07 LAB — CBC
HCT: 33 — ABNORMAL LOW
HCT: 35.3 — ABNORMAL LOW
HCT: 41.2
HCT: 46.5
HCT: 32.5 — ABNORMAL LOW
HCT: 33 — ABNORMAL LOW
HCT: 34.3 — ABNORMAL LOW
HCT: 35.8 — ABNORMAL LOW
Hemoglobin: 10.7 — ABNORMAL LOW
Hemoglobin: 11.1 — ABNORMAL LOW
Hemoglobin: 11.8 — ABNORMAL LOW
Hemoglobin: 15.4
Hemoglobin: 10.9 — ABNORMAL LOW
Hemoglobin: 10.9 — ABNORMAL LOW
Hemoglobin: 11.3 — ABNORMAL LOW
Hemoglobin: 12.1 — ABNORMAL LOW
Hemoglobin: 12.5 — ABNORMAL LOW
MCHC: 33.2
MCHC: 33.5
MCHC: 33.9
MCHC: 34
MCHC: 34.2
MCHC: 33
MCHC: 33.4
MCHC: 33.5
MCHC: 33.5
MCHC: 33.5
MCHC: 33.8
MCV: 84.6
MCV: 84.9
MCV: 85.3
MCV: 85.4
MCV: 83.9
MCV: 84.1
Platelets: 184
Platelets: 204
Platelets: 326
Platelets: 359
Platelets: 462 — ABNORMAL HIGH
Platelets: 475 — ABNORMAL HIGH
Platelets: 543 — ABNORMAL HIGH
Platelets: 571 — ABNORMAL HIGH
RBC: 3.75 — ABNORMAL LOW
RBC: 3.86 — ABNORMAL LOW
RBC: 3.94 — ABNORMAL LOW
RBC: 5.48
RBC: 3.75 — ABNORMAL LOW
RBC: 3.89 — ABNORMAL LOW
RBC: 4.02 — ABNORMAL LOW
RBC: 4.27
RBC: 4.4
RDW: 12.9
RDW: 13.5
RDW: 13.6
RDW: 13.7
RDW: 13.4
RDW: 13.6
RDW: 13.9
WBC: 6.1
WBC: 8.6
WBC: 10.4
WBC: 6.5
WBC: 8.1
WBC: 9.3

## 2011-06-07 LAB — DIFFERENTIAL
Basophils Absolute: 0
Eosinophils Absolute: 0
Lymphocytes Relative: 8 — ABNORMAL LOW
Lymphs Abs: 0.2 — ABNORMAL LOW
Lymphs Abs: 0.5 — ABNORMAL LOW
Monocytes Relative: 7
Neutro Abs: 5
Neutro Abs: 6.1
Neutrophils Relative %: 81 — ABNORMAL HIGH
Neutrophils Relative %: 89 — ABNORMAL HIGH

## 2011-06-07 LAB — COMPREHENSIVE METABOLIC PANEL
ALT: 12
ALT: 23
ALT: 15
ALT: 15
ALT: 17
AST: 12
AST: 15
Albumin: 2.1 — ABNORMAL LOW
Albumin: 2.3 — ABNORMAL LOW
Albumin: 2.4 — ABNORMAL LOW
Albumin: 2.7 — ABNORMAL LOW
Alkaline Phosphatase: 43
Alkaline Phosphatase: 46
BUN: 22
BUN: 6
BUN: 8
BUN: 9
CO2: 21
CO2: 21
CO2: 23
CO2: 23
Calcium: 7.9 — ABNORMAL LOW
Calcium: 9.4
Calcium: 8.5
Calcium: 8.9
Calcium: 9
Calcium: 9
Calcium: 9.2
Chloride: 108
Chloride: 111
Chloride: 111
Chloride: 112
Creatinine, Ser: 1.39
Creatinine, Ser: 1.35
Creatinine, Ser: 1.37
Creatinine, Ser: 1.44
Creatinine, Ser: 1.45
GFR calc Af Amer: >60
GFR calc Af Amer: >60
GFR calc non Af Amer: 52 — ABNORMAL LOW
GFR calc non Af Amer: 51 — ABNORMAL LOW
GFR calc non Af Amer: 54 — ABNORMAL LOW
Glucose, Bld: 103 — ABNORMAL HIGH
Glucose, Bld: 126 — ABNORMAL HIGH
Glucose, Bld: 105 — ABNORMAL HIGH
Glucose, Bld: 112 — ABNORMAL HIGH
Glucose, Bld: 112 — ABNORMAL HIGH
Glucose, Bld: 97
Potassium: 4.1
Potassium: 4.3
Sodium: 138
Sodium: 144
Sodium: 135
Sodium: 141
Sodium: 142
Total Bilirubin: 0.5
Total Bilirubin: 0.5
Total Protein: 4.9 — ABNORMAL LOW
Total Protein: 8.2
Total Protein: 6.1
Total Protein: 6.1
Total Protein: 6.5

## 2011-06-07 LAB — URINALYSIS, ROUTINE W REFLEX MICROSCOPIC
Glucose, UA: NEGATIVE
Hgb urine dipstick: NEGATIVE
Ketones, ur: 15 — AB
Ketones, ur: NEGATIVE
Nitrite: NEGATIVE
Nitrite: NEGATIVE
Protein, ur: NEGATIVE
Specific Gravity, Urine: 1.027
Urobilinogen, UA: 0.2
Urobilinogen, UA: 1

## 2011-06-07 LAB — LIPASE, BLOOD
Lipase: 31
Lipase: 35

## 2011-06-07 LAB — I-STAT 8, (EC8 V) (CONVERTED LAB)
Acid-base deficit: 2
Bicarbonate: 20.6
Glucose, Bld: 115 — ABNORMAL HIGH
Hemoglobin: 15
Operator id: 294501
Potassium: 3.8
Sodium: 145
TCO2: 21

## 2011-06-07 LAB — POCT I-STAT CREATININE
Creatinine, Ser: 1.7 — ABNORMAL HIGH
Operator id: 294501

## 2011-06-07 LAB — CULTURE, BLOOD (ROUTINE X 2): Culture: NO GROWTH

## 2011-06-07 LAB — URINE CULTURE: Colony Count: NO GROWTH

## 2011-06-07 LAB — PHOSPHORUS
Phosphorus: 2.1 — ABNORMAL LOW
Phosphorus: 3.9

## 2011-06-07 LAB — PROTIME-INR
INR: 1
INR: 1.1
Prothrombin Time: 13.3

## 2011-06-07 LAB — APTT: aPTT: 29

## 2011-06-07 LAB — MAGNESIUM
Magnesium: 1.9
Magnesium: 2

## 2011-06-07 LAB — TSH: TSH: 0.227 — ABNORMAL LOW

## 2011-06-07 LAB — URINE MICROSCOPIC-ADD ON

## 2012-11-10 ENCOUNTER — Inpatient Hospital Stay (HOSPITAL_COMMUNITY)
Admission: EM | Admit: 2012-11-10 | Discharge: 2012-11-20 | DRG: 982 | Disposition: A | Discharge status: Home / Self Care | Payer: Medicare Other | Attending: Family Medicine | Admitting: Family Medicine

## 2012-11-10 ENCOUNTER — Encounter (HOSPITAL_COMMUNITY): Payer: Self-pay | Admitting: Emergency Medicine

## 2012-11-10 ENCOUNTER — Emergency Department (HOSPITAL_COMMUNITY): Payer: Medicare Other

## 2012-11-10 DIAGNOSIS — K2970 Gastritis, unspecified, without bleeding: Secondary | ICD-10-CM | POA: Diagnosis present

## 2012-11-10 DIAGNOSIS — K920 Hematemesis: Secondary | ICD-10-CM | POA: Diagnosis present

## 2012-11-10 DIAGNOSIS — N400 Enlarged prostate without lower urinary tract symptoms: Secondary | ICD-10-CM | POA: Diagnosis present

## 2012-11-10 DIAGNOSIS — I498 Other specified cardiac arrhythmias: Secondary | ICD-10-CM | POA: Diagnosis present

## 2012-11-10 DIAGNOSIS — Y849 Medical procedure, unspecified as the cause of abnormal reaction of the patient, or of later complication, without mention of misadventure at the time of the procedure: Secondary | ICD-10-CM | POA: Diagnosis present

## 2012-11-10 DIAGNOSIS — R4701 Aphasia: Secondary | ICD-10-CM | POA: Diagnosis present

## 2012-11-10 DIAGNOSIS — Z79899 Other long term (current) drug therapy: Secondary | ICD-10-CM

## 2012-11-10 DIAGNOSIS — G8190 Hemiplegia, unspecified affecting unspecified side: Secondary | ICD-10-CM | POA: Diagnosis present

## 2012-11-10 DIAGNOSIS — K449 Diaphragmatic hernia without obstruction or gangrene: Secondary | ICD-10-CM | POA: Diagnosis present

## 2012-11-10 DIAGNOSIS — I129 Hypertensive chronic kidney disease with stage 1 through stage 4 chronic kidney disease, or unspecified chronic kidney disease: Secondary | ICD-10-CM | POA: Diagnosis present

## 2012-11-10 DIAGNOSIS — F0390 Unspecified dementia without behavioral disturbance: Secondary | ICD-10-CM | POA: Diagnosis present

## 2012-11-10 DIAGNOSIS — K567 Ileus, unspecified: Secondary | ICD-10-CM | POA: Diagnosis present

## 2012-11-10 DIAGNOSIS — Z8673 Personal history of transient ischemic attack (TIA), and cerebral infarction without residual deficits: Secondary | ICD-10-CM

## 2012-11-10 DIAGNOSIS — I639 Cerebral infarction, unspecified: Secondary | ICD-10-CM | POA: Diagnosis present

## 2012-11-10 DIAGNOSIS — I472 Ventricular tachycardia: Secondary | ICD-10-CM

## 2012-11-10 DIAGNOSIS — I10 Essential (primary) hypertension: Secondary | ICD-10-CM | POA: Diagnosis present

## 2012-11-10 DIAGNOSIS — N183 Chronic kidney disease, stage 3 unspecified: Secondary | ICD-10-CM | POA: Diagnosis present

## 2012-11-10 DIAGNOSIS — E785 Hyperlipidemia, unspecified: Secondary | ICD-10-CM | POA: Diagnosis present

## 2012-11-10 DIAGNOSIS — I420 Dilated cardiomyopathy: Secondary | ICD-10-CM

## 2012-11-10 DIAGNOSIS — K560 Paralytic ileus: Secondary | ICD-10-CM | POA: Diagnosis present

## 2012-11-10 DIAGNOSIS — K922 Gastrointestinal hemorrhage, unspecified: Secondary | ICD-10-CM | POA: Diagnosis present

## 2012-11-10 DIAGNOSIS — K929 Disease of digestive system, unspecified: Principal | ICD-10-CM | POA: Diagnosis present

## 2012-11-10 DIAGNOSIS — K219 Gastro-esophageal reflux disease without esophagitis: Secondary | ICD-10-CM | POA: Diagnosis present

## 2012-11-10 DIAGNOSIS — I319 Disease of pericardium, unspecified: Secondary | ICD-10-CM | POA: Diagnosis present

## 2012-11-10 DIAGNOSIS — I69351 Hemiplegia and hemiparesis following cerebral infarction affecting right dominant side: Secondary | ICD-10-CM

## 2012-11-10 HISTORY — DX: Cerebral infarction, unspecified: I63.9

## 2012-11-10 HISTORY — DX: Essential (primary) hypertension: I10

## 2012-11-10 HISTORY — DX: Unspecified hydronephrosis: N13.30

## 2012-11-10 HISTORY — DX: Benign prostatic hyperplasia without lower urinary tract symptoms: N40.0

## 2012-11-10 HISTORY — DX: Gastro-esophageal reflux disease without esophagitis: K21.9

## 2012-11-10 HISTORY — DX: Hyperlipidemia, unspecified: E78.5

## 2012-11-10 HISTORY — DX: Unspecified dementia, unspecified severity, without behavioral disturbance, psychotic disturbance, mood disturbance, and anxiety: F03.90

## 2012-11-10 MED ORDER — SODIUM CHLORIDE 0.9 % IV SOLN
8.0000 mg/h | INTRAVENOUS | Status: DC
  Administered 2012-11-11: 8 mg/h via INTRAVENOUS
  Filled 2012-11-10 (×3): qty 80

## 2012-11-10 MED ORDER — SODIUM CHLORIDE 0.9 % IV SOLN
1000.0000 mL | Freq: Once | INTRAVENOUS | Status: AC
  Administered 2012-11-10: 1000 mL via INTRAVENOUS

## 2012-11-10 MED ORDER — PANTOPRAZOLE SODIUM 40 MG IV SOLR
40.0000 mg | Freq: Once | INTRAVENOUS | Status: AC
  Administered 2012-11-10: 40 mg via INTRAVENOUS
  Filled 2012-11-10: qty 40

## 2012-11-10 MED ORDER — ONDANSETRON HCL 4 MG/2ML IJ SOLN
4.0000 mg | Freq: Once | INTRAMUSCULAR | Status: AC
  Administered 2012-11-10: 4 mg via INTRAVENOUS
  Filled 2012-11-10: qty 2

## 2012-11-10 MED ORDER — SODIUM CHLORIDE 0.9 % IV SOLN
1000.0000 mL | INTRAVENOUS | Status: DC
  Administered 2012-11-11: 1000 mL via INTRAVENOUS

## 2012-11-10 NOTE — ED Notes (Signed)
Pt brought to ED via [facility] from [facility] for evaluation of hematemesis for the past 24 hours.  Was given Phenergan IM by Cheyenne Adas prior to EMS arrival.  Pt began having chest pain with last vomiting episode, was given 1 nitro by EMS.  Placed on cardiac monitor upon arrival to ED.  IV in place, sinus tach on monitor.

## 2012-11-10 NOTE — ED Provider Notes (Signed)
History     CSN: [id number]  Arrival date & time 11/10/12  2306   First MD Initiated Contact with Patient 11/10/12 2309      Chief Complaint  Patient presents with  . Hematemesis  . Chest Pain     The history is provided by the patient.   patient presents from the nursing home with nausea vomiting x24 hours.  Nursing home staff reports coffee-ground emesis x2.  History of upper GI bleed before in the past.  Patient denies abdominal pain chest pain or shortness of breath.  History of stroke on aspirin.  No other anticoagulants.  Patient given antinausea medicine in route.  Sinus tach noted by EMS.  No fevers or chills.  The patient is had a stroke before in the past and has right upper extremity weakness and contractures    Past Medical History  Diagnosis Date  . Dementia   . Stroke   . Hydronephrosis   . Hypertension   . Hyperlipemia   . GERD (gastroesophageal reflux disease)   . BPH (benign prostatic hyperplasia)     No past surgical history on file.  No family history on file.  History  Substance Use Topics  . Smoking status: Never Smoker   . Smokeless tobacco: Never Used  . Alcohol Use: No      Review of Systems  All other systems reviewed and are negative.    Allergies  Review of patient's allergies indicates no known allergies.  Home Medications   Current Outpatient Rx  Name  Route  Sig  Dispense  Refill  . amLODipine (NORVASC) 10 MG tablet   Oral   Take 10 mg by mouth daily.         . bisacodyl (BISACODYL) 5 MG EC tablet   Oral   Take 15 mg by mouth at bedtime.         . citalopram (CELEXA) 10 MG tablet   Oral   Take 5 mg by mouth daily.         . cloNIDine (CATAPRES) 0.3 MG tablet   Oral   Take 0.3 mg by mouth 2 (two) times daily.         Marland Kitchen docusate sodium (COLACE) 100 MG capsule   Oral   Take 100 mg by mouth 2 (two) times daily.         . hydrALAZINE (APRESOLINE) 50 MG tablet   Oral   Take 50 mg by mouth 3 (three) times  daily.         Marland Kitchen labetalol (NORMODYNE) 300 MG tablet   Oral   Take 300 mg by mouth 2 (two) times daily.         Marland Kitchen lactulose (CHRONULAC) 10 GM/15ML solution   Oral   Take 30 mLs by mouth 2 (two) times daily.         . memantine (NAMENDA) 10 MG tablet   Oral   Take 10 mg by mouth 2 (two) times daily.         Marland Kitchen omeprazole (PRILOSEC) 20 MG capsule   Oral   Take 20 mg by mouth daily.         . simvastatin (ZOCOR) 40 MG tablet   Oral   Take 40 mg by mouth every evening.         . tamsulosin (FLOMAX) 0.4 MG CAPS   Oral   Take 0.4 mg by mouth daily.           BP 154/130  Pulse 124  Temp(Src) 98.6 F (37 C) (Oral)  Resp 20  SpO2 100%  Physical Exam  Nursing note and vitals reviewed. Constitutional: He appears well-developed and well-nourished.  HENT:  Head: Normocephalic and atraumatic.  Eyes: EOM are normal.  Neck: Normal range of motion.  Cardiovascular: Regular rhythm, normal heart sounds and intact distal pulses.   Tachycardia  Pulmonary/Chest: Effort normal and breath sounds normal. No respiratory distress.  Abdominal: Soft. He exhibits no distension. There is no tenderness.  Genitourinary:  Dark brown stool on rectal exam.  No gross blood  Musculoskeletal: He exhibits no edema and no tenderness.  Neurological: He is alert.  Oriented.  Contractures of his upper extremities  Skin: Skin is warm and dry.  Psychiatric: He has a normal mood and affect. Judgment normal.    ED Course  Procedures (including critical care time)   Date: 11/10/2012  Rate: 124  Rhythm: sinus tachycardia  QRS Axis: normal  Intervals: normal  ST/T Wave abnormalities: normal  Conduction Disutrbances: LBBB  Narrative Interpretation:   Old EKG Reviewed: No significant changes noted     Labs Reviewed  CBC - Abnormal; Notable for the following:    WBC 11.8 (*)    All other components within normal limits  BASIC METABOLIC PANEL - Abnormal; Notable for the following:     Glucose, Bld 136 (*)    BUN 32 (*)    Creatinine, Ser 1.38 (*)    GFR calc non Af Amer 51 (*)    GFR calc Af Amer 60 (*)    All other components within normal limits  OCCULT BLOOD, POC DEVICE - Abnormal; Notable for the following:    Fecal Occult Bld POSITIVE (*)    All other components within normal limits  PROTIME-INR  TROPONIN I  TYPE AND SCREEN   Dg Chest 2 View  11/11/2012  *RADIOLOGY REPORT*  Clinical Data: Chest pain.  CHEST - 2 VIEW  Comparison: 10/23/2007 and prior chest radiographs  Findings: Cardiomegaly and peribronchial thickening again noted. Left basilar scarring/density is unchanged. There is no evidence of pleural effusion, pneumothorax or pulmonary edema. No acute bony abnormalities are identified.  IMPRESSION: Cardiomegaly without evidence of acute cardiopulmonary disease.   Original Report Authenticated By: Harmon Pier, M.D.    Dg Abd 2 Views  11/11/2012  *RADIOLOGY REPORT*  Clinical Data: Abdominal pain and vomiting.  ABDOMEN - 2 VIEW  Comparison: 01/15/2011 radiographs  Findings: Gas filled loops of colon and small bowel identified. No dilated loops of bowel are present. There is no evidence of pneumoperitoneum. No suspicious calcifications are noted. A severe thoracolumbar scoliosis is again identified.  IMPRESSION: Nonspecific nonobstructive bowel gas pattern - no evidence of pneumoperitoneum.   Original Report Authenticated By: Harmon Pier, M.D.    I personally reviewed the imaging tests through PACS system I reviewed available ER/hospitalization records through the EMR   1. Upper GI bleed       MDM  This patient in upper GI bleed.  Hemoglobin 14.  No anticoagulants except for aspirin.  Heart rate remains in the 120s despite IV fluids.  No abdominal tenderness on exam.  No vomiting while in the ER.  Protonic strip.  Admitted to the hospital.  Will bolus another liter of fluid at this time  Hospitalist: Dr Lucila Maine, MD 11/11/12  [phone]

## 2012-11-11 ENCOUNTER — Encounter (HOSPITAL_COMMUNITY)
Admission: EM | Disposition: A | Discharge status: Home / Self Care | Payer: Self-pay | Source: Home / Self Care | Attending: Internal Medicine

## 2012-11-11 ENCOUNTER — Emergency Department (HOSPITAL_COMMUNITY): Payer: Medicare Other

## 2012-11-11 ENCOUNTER — Inpatient Hospital Stay (HOSPITAL_COMMUNITY): Payer: Medicare Other

## 2012-11-11 ENCOUNTER — Encounter (HOSPITAL_COMMUNITY): Payer: Self-pay | Admitting: Gastroenterology

## 2012-11-11 DIAGNOSIS — K219 Gastro-esophageal reflux disease without esophagitis: Secondary | ICD-10-CM | POA: Diagnosis present

## 2012-11-11 DIAGNOSIS — K920 Hematemesis: Secondary | ICD-10-CM | POA: Diagnosis present

## 2012-11-11 DIAGNOSIS — K922 Gastrointestinal hemorrhage, unspecified: Secondary | ICD-10-CM

## 2012-11-11 DIAGNOSIS — I10 Essential (primary) hypertension: Secondary | ICD-10-CM

## 2012-11-11 DIAGNOSIS — I639 Cerebral infarction, unspecified: Secondary | ICD-10-CM | POA: Diagnosis present

## 2012-11-11 DIAGNOSIS — F0390 Unspecified dementia without behavioral disturbance: Secondary | ICD-10-CM | POA: Diagnosis present

## 2012-11-11 HISTORY — PX: ESOPHAGOGASTRODUODENOSCOPY: SHX5428

## 2012-11-11 LAB — CBC
HCT: 41.6 % (ref 39.0–52.0)
Hemoglobin: 12.3 g/dL — ABNORMAL LOW (ref 13.0–17.0)
Hemoglobin: 14.1 g/dL (ref 13.0–17.0)
MCHC: 32.8 g/dL (ref 30.0–36.0)
MCHC: 33.9 g/dL (ref 30.0–36.0)
MCV: 79.8 fL (ref 78.0–100.0)
RBC: 4.65 MIL/uL (ref 4.22–5.81)
RBC: 5.21 MIL/uL (ref 4.22–5.81)
RDW: 14.2 % (ref 11.5–15.5)
WBC: 11.8 10*3/uL — ABNORMAL HIGH (ref 4.0–10.5)
WBC: 8.4 10*3/uL (ref 4.0–10.5)

## 2012-11-11 LAB — BASIC METABOLIC PANEL
Calcium: 9.9 mg/dL (ref 8.4–10.5)
GFR calc Af Amer: 60 mL/min — ABNORMAL LOW (ref >90)
GFR calc non Af Amer: 51 mL/min — ABNORMAL LOW (ref >90)
Potassium: 3.9 mEq/L (ref 3.5–5.1)
Sodium: 138 mEq/L (ref 135–145)

## 2012-11-11 LAB — HEMOGLOBIN AND HEMATOCRIT, BLOOD
HCT: 37 % — ABNORMAL LOW (ref 39.0–52.0)
HCT: 37.6 % — ABNORMAL LOW (ref 39.0–52.0)
HCT: 35 % — ABNORMAL LOW (ref 39.0–52.0)
Hemoglobin: 12.3 g/dL — ABNORMAL LOW (ref 13.0–17.0)
Hemoglobin: 12.5 g/dL — ABNORMAL LOW (ref 13.0–17.0)
Hemoglobin: 11.5 g/dL — ABNORMAL LOW (ref 13.0–17.0)

## 2012-11-11 LAB — HEPATIC FUNCTION PANEL
Albumin: 3.1 g/dL — ABNORMAL LOW (ref 3.5–5.2)
Total Protein: 6.5 g/dL (ref 6.0–8.3)

## 2012-11-11 LAB — MRSA PCR SCREENING: MRSA by PCR: NEGATIVE

## 2012-11-11 LAB — PROTIME-INR: INR: 1.01 (ref 0.00–1.49)

## 2012-11-11 LAB — TYPE AND SCREEN: ABO/RH(D): B POS

## 2012-11-11 SURGERY — EGD (ESOPHAGOGASTRODUODENOSCOPY)
Anesthesia: Moderate Sedation

## 2012-11-11 MED ORDER — HYDRALAZINE HCL 50 MG PO TABS
50.0000 mg | ORAL_TABLET | Freq: Three times a day (TID) | ORAL | Status: DC
  Administered 2012-11-11 – 2012-11-14 (×8): 50 mg via ORAL
  Filled 2012-11-11 (×11): qty 1

## 2012-11-11 MED ORDER — LABETALOL HCL 5 MG/ML IV SOLN
20.0000 mg | Freq: Once | INTRAVENOUS | Status: AC
  Administered 2012-11-11: 20 mg via INTRAVENOUS
  Filled 2012-11-11: qty 4

## 2012-11-11 MED ORDER — PANTOPRAZOLE SODIUM 40 MG IV SOLR
40.0000 mg | Freq: Two times a day (BID) | INTRAVENOUS | Status: DC
  Administered 2012-11-12 – 2012-11-13 (×4): 40 mg via INTRAVENOUS
  Filled 2012-11-11 (×7): qty 40

## 2012-11-11 MED ORDER — LABETALOL HCL 300 MG PO TABS
300.0000 mg | ORAL_TABLET | Freq: Two times a day (BID) | ORAL | Status: DC
  Administered 2012-11-11 – 2012-11-14 (×6): 300 mg via ORAL
  Filled 2012-11-11 (×9): qty 1

## 2012-11-11 MED ORDER — CLONIDINE HCL 0.3 MG PO TABS
0.3000 mg | ORAL_TABLET | Freq: Two times a day (BID) | ORAL | Status: DC
  Administered 2012-11-11 – 2012-11-12 (×2): 0.3 mg via ORAL
  Filled 2012-11-11 (×3): qty 1

## 2012-11-11 MED ORDER — LABETALOL HCL 5 MG/ML IV SOLN
10.0000 mg | INTRAVENOUS | Status: DC | PRN
  Administered 2012-11-11: 10 mg via INTRAVENOUS
  Filled 2012-11-11: qty 4

## 2012-11-11 MED ORDER — FENTANYL CITRATE 0.05 MG/ML IJ SOLN
INTRAMUSCULAR | Status: DC | PRN
  Administered 2012-11-11 (×2): 25 ug via INTRAVENOUS

## 2012-11-11 MED ORDER — DIPHENHYDRAMINE HCL 50 MG/ML IJ SOLN
INTRAMUSCULAR | Status: AC
  Filled 2012-11-11: qty 1

## 2012-11-11 MED ORDER — LACTULOSE 10 GM/15ML PO SOLN
20.0000 g | Freq: Two times a day (BID) | ORAL | Status: DC
  Administered 2012-11-11 – 2012-11-14 (×6): 20 g via ORAL
  Filled 2012-11-11 (×9): qty 30

## 2012-11-11 MED ORDER — MIDAZOLAM HCL 5 MG/ML IJ SOLN
INTRAMUSCULAR | Status: AC
  Filled 2012-11-11: qty 2

## 2012-11-11 MED ORDER — TAMSULOSIN HCL 0.4 MG PO CAPS
0.4000 mg | ORAL_CAPSULE | Freq: Every day | ORAL | Status: DC
  Administered 2012-11-11 – 2012-11-14 (×3): 0.4 mg via ORAL
  Filled 2012-11-11 (×5): qty 1

## 2012-11-11 MED ORDER — CITALOPRAM HYDROBROMIDE 10 MG PO TABS
5.0000 mg | ORAL_TABLET | Freq: Every day | ORAL | Status: DC
  Administered 2012-11-11 – 2012-11-15 (×4): 5 mg via ORAL
  Filled 2012-11-11 (×6): qty 1

## 2012-11-11 MED ORDER — SODIUM CHLORIDE 0.9 % IV BOLUS (SEPSIS)
1000.0000 mL | Freq: Once | INTRAVENOUS | Status: AC
  Administered 2012-11-11: 1000 mL via INTRAVENOUS

## 2012-11-11 MED ORDER — ONDANSETRON HCL 4 MG/2ML IJ SOLN
4.0000 mg | Freq: Four times a day (QID) | INTRAMUSCULAR | Status: DC | PRN
  Administered 2012-11-18: 4 mg via INTRAVENOUS
  Filled 2012-11-11: qty 2

## 2012-11-11 MED ORDER — SODIUM CHLORIDE 0.9 % IV SOLN: INTRAVENOUS | Status: DC

## 2012-11-11 MED ORDER — FENTANYL CITRATE 0.05 MG/ML IJ SOLN
INTRAMUSCULAR | Status: AC
  Filled 2012-11-11: qty 2

## 2012-11-11 MED ORDER — SODIUM CHLORIDE 0.9 % IV SOLN
INTRAVENOUS | Status: AC
  Administered 2012-11-11: 10:00:00 via INTRAVENOUS

## 2012-11-11 MED ORDER — MIDAZOLAM HCL 10 MG/2ML IJ SOLN
INTRAMUSCULAR | Status: DC | PRN
  Administered 2012-11-11 (×2): 2 mg via INTRAVENOUS

## 2012-11-11 MED ORDER — ONDANSETRON HCL 4 MG PO TABS: 4.0000 mg | ORAL_TABLET | Freq: Four times a day (QID) | ORAL | Status: DC | PRN

## 2012-11-11 MED ORDER — CLONIDINE HCL 0.3 MG/24HR TD PTWK
0.3000 mg | MEDICATED_PATCH | TRANSDERMAL | Status: DC
  Administered 2012-11-11: 0.3 mg via TRANSDERMAL
  Filled 2012-11-11: qty 1

## 2012-11-11 MED ORDER — MEMANTINE HCL 10 MG PO TABS
10.0000 mg | ORAL_TABLET | Freq: Two times a day (BID) | ORAL | Status: DC
  Administered 2012-11-11 – 2012-11-15 (×8): 10 mg via ORAL
  Filled 2012-11-11 (×11): qty 1

## 2012-11-11 MED ORDER — AMLODIPINE BESYLATE 10 MG PO TABS
10.0000 mg | ORAL_TABLET | Freq: Every day | ORAL | Status: DC
  Administered 2012-11-11 – 2012-11-12 (×2): 10 mg via ORAL
  Filled 2012-11-11 (×2): qty 1

## 2012-11-11 MED ORDER — SIMVASTATIN 40 MG PO TABS
40.0000 mg | ORAL_TABLET | Freq: Every evening | ORAL | Status: DC
  Administered 2012-11-11 – 2012-11-13 (×3): 40 mg via ORAL
  Filled 2012-11-11 (×5): qty 1

## 2012-11-11 MED ORDER — DOCUSATE SODIUM 100 MG PO CAPS
100.0000 mg | ORAL_CAPSULE | Freq: Two times a day (BID) | ORAL | Status: DC
  Administered 2012-11-11 – 2012-11-14 (×6): 100 mg via ORAL
  Filled 2012-11-11 (×7): qty 1

## 2012-11-11 MED ORDER — BUTAMBEN-TETRACAINE-BENZOCAINE 2-2-14 % EX AERO
INHALATION_SPRAY | CUTANEOUS | Status: DC | PRN
  Administered 2012-11-11: 2 via TOPICAL

## 2012-11-11 NOTE — H&P (Signed)
Chief Complaint:  vomiting  HPI: 67 yo male h/o cva with residual exp aphasia and rt upper ext weakness comes in with several episodes of n/v and coffee ground emesis for over 24 hours.  He denies any fevers.  He has mild expressive aphasia.  No diarrhea.  Some epigastric abd pain.  On asa.  No cp no sob.  Sent from snf for further evaluation.  Review of Systems:  Positive and negative as per HPI otherwise all other systems are negative  Past Medical History: Past Medical History  Diagnosis Date  . Dementia   . Stroke   . Hydronephrosis   . Hypertension   . Hyperlipemia   . GERD (gastroesophageal reflux disease)   . BPH (benign prostatic hyperplasia)    Medications: Prior to Admission medications   Medication Sig Start Date End Date Taking? Authorizing Provider  amLODipine (NORVASC) 10 MG tablet Take 10 mg by mouth daily.   Yes Historical Provider, MD  bisacodyl (BISACODYL) 5 MG EC tablet Take 15 mg by mouth at bedtime.   Yes Historical Provider, MD  citalopram (CELEXA) 10 MG tablet Take 5 mg by mouth daily.   Yes Historical Provider, MD  cloNIDine (CATAPRES) 0.3 MG tablet Take 0.3 mg by mouth 2 (two) times daily.   Yes Historical Provider, MD  docusate sodium (COLACE) 100 MG capsule Take 100 mg by mouth 2 (two) times daily.   Yes Historical Provider, MD  hydrALAZINE (APRESOLINE) 50 MG tablet Take 50 mg by mouth 3 (three) times daily.   Yes Historical Provider, MD  labetalol (NORMODYNE) 300 MG tablet Take 300 mg by mouth 2 (two) times daily.   Yes Historical Provider, MD  lactulose (CHRONULAC) 10 GM/15ML solution Take 30 mLs by mouth 2 (two) times daily.   Yes Historical Provider, MD  memantine (NAMENDA) 10 MG tablet Take 10 mg by mouth 2 (two) times daily.   Yes Historical Provider, MD  omeprazole (PRILOSEC) 20 MG capsule Take 20 mg by mouth daily.   Yes Historical Provider, MD  simvastatin (ZOCOR) 40 MG tablet Take 40 mg by mouth every evening.   Yes Historical Provider, MD   tamsulosin (FLOMAX) 0.4 MG CAPS Take 0.4 mg by mouth daily.   Yes Historical Provider, MD    Allergies:  No Known Allergies  Social History:  reports that he has never smoked. He has never used smokeless tobacco. He reports that he does not drink alcohol or use illicit drugs.  Family History: negatvie  Physical Exam: Filed Vitals:   11/10/12 2306 11/10/12 2322 11/11/12 0135  BP:  154/130 186/111  Pulse:  124 121  Temp:  98.6 F (37 C)   TempSrc:  Oral   Resp:  20 26  SpO2: 98% 100% 95%   General appearance: alert, cooperative and no distress Head: Normocephalic, without obvious abnormality, atraumatic Eyes: negative Nose: Nares normal. Septum midline. Mucosa normal. No drainage or sinus tenderness. Neck: no JVD and supple, symmetrical, trachea midline Lungs: clear to auscultation bilaterally Heart: regular rate and rhythm, S1, S2 normal, no murmur, click, rub or gallop Abdomen: soft, non-tender; bowel sounds normal; no masses,  no organomegaly Extremities: extremities normal, atraumatic, no cyanosis or edema Pulses: 2+ and symmetric Skin: Skin color, texture, turgor normal. No rashes or lesions Neurologic: Mental status: Alert, oriented, thought content appropriate Cranial nerves: normal mild expressive aphasia rue contracted from prev cva    Labs on Admission:   Recent Labs  11/10/12 2341  NA 138  K 3.9  CL 103  CO2 22  GLUCOSE 136*  BUN 32*  CREATININE 1.38*  CALCIUM 9.9    Recent Labs  11/10/12 2341  WBC 11.8*  HGB 14.1  HCT 41.6  MCV 79.8  PLT 275    Recent Labs  11/10/12 2342  TROPONINI <0.30   Radiological Exams on Admission: Dg Chest 2 View  11/11/2012  *RADIOLOGY REPORT*  Clinical Data: Chest pain.  CHEST - 2 VIEW  Comparison: 10/23/2007 and prior chest radiographs  Findings: Cardiomegaly and peribronchial thickening again noted. Left basilar scarring/density is unchanged. There is no evidence of pleural effusion, pneumothorax or  pulmonary edema. No acute bony abnormalities are identified.  IMPRESSION: Cardiomegaly without evidence of acute cardiopulmonary disease.   Original Report Authenticated By: Harmon Pier, M.D.    Dg Abd 2 Views  11/11/2012  *RADIOLOGY REPORT*  Clinical Data: Abdominal pain and vomiting.  ABDOMEN - 2 VIEW  Comparison: 01/15/2011 radiographs  Findings: Gas filled loops of colon and small bowel identified. No dilated loops of bowel are present. There is no evidence of pneumoperitoneum. No suspicious calcifications are noted. A severe thoracolumbar scoliosis is again identified.  IMPRESSION: Nonspecific nonobstructive bowel gas pattern - no evidence of pneumoperitoneum.   Original Report Authenticated By: Harmon Pier, M.D.     Assessment/Plan 67 yo male with ugib  Principal Problem:   Hematemesis/vomiting blood Active Problems:   Dementia   Stroke   Hypertension   GERD (gastroesophageal reflux disease)   UGIB (upper gastrointestinal bleed)   Hemiparesis affecting right side as late effect of stroke   Expressive aphasia  Hold asa.  protonix gtt.  Stepdown.  Ivf.  Serial h/h.  GI will need to be called in the am.  Keep npo for possible EGD.  Vss but is tachycardic sinus.  Presumptive full code.    Zyier Dykema A 11/11/2012, 2:20 AM

## 2012-11-11 NOTE — Interval H&P Note (Signed)
History and Physical Interval Note:  11/11/2012 12:16 PM  [name]  has presented today for surgery, with the diagnosis of GI bleed   The various methods of treatment have been discussed with the patient and family. After consideration of risks, benefits and other options for treatment, the patient has consented to  Procedure(s): ESOPHAGOGASTRODUODENOSCOPY (EGD) (N/A) as a surgical intervention .  The patient's history has been reviewed, patient examined, no change in status, stable for surgery.  I have reviewed the patient's chart and labs.  Questions were answered to the patient's satisfaction.     Tamiko Leopard C.

## 2012-11-11 NOTE — Brief Op Note (Signed)
  No bleeding or blood products seen. Will do abd/pelvis CT as next step to further assess abdominal pain and GI bleed. May need a colonoscopy but unclear if he can drink a colon prep.

## 2012-11-11 NOTE — Care Management Note (Signed)
    Page 1 of 1   11/11/2012     1:33:56 PM   CARE MANAGEMENT NOTE 11/11/2012  Patient:  [name], [name]   Account Number:  [id number]  Date Initiated:  11/11/2012  Documentation initiated by:  Junius Creamer  Subjective/Objective Assessment:   adm w gi bleed, htn     Action/Plan:   lives at nsg facility, sw ref made   Anticipated DC Date:     Anticipated DC Plan:    In-house referral  Clinical Social Worker      DC Planning Services  CM consult      Choice offered to / List presented to:             Status of service:   Medicare Important Message given?   (If response is "NO", the following Medicare IM given date fields will be blank) Date Medicare IM given:   Date Additional Medicare IM given:    Discharge Disposition:    Per UR Regulation:  Reviewed for med. necessity/level of care/duration of stay  If discussed at Long Length of Stay Meetings, dates discussed:    Comments:  2/26 1333 debbie Jacobi Ryant rn,bsn

## 2012-11-11 NOTE — Progress Notes (Signed)
Patient ID: [name]  male  WGN:[id number]    DOB: 05-Dec-1945    DOA: 11/10/2012  PCP: No primary provider on file.  Assessment/Plan: Principal Problem:   Hematemesis/vomiting blood, upper GI bleed - Patient seen earlier this morning, discussed with gastroenterology, Dr. Bosie Clos place him n.p.o. for endoscopy - Patient underwent EGD which did not show any active bleeding, plans for colonoscopy - Placed on clear liquid diet for now, IV PPI  Active Problems:   Hypertension, accelerated - Initially placed on IV labetalol PRN and clonidine patch. Subsequently patient started on oral diet, restart all home medications    Dementia    GERD (gastroesophageal reflux disease): See #1     Hemiparesis affecting right side as late effect of stroke,  Expressive aphasia - Appears to be due to prior stroke,  DVT Prophylaxis: SCDs  Code Status:  Disposition:  Subjective: Some dysarthria, unable to provide review of systems  Objective: Weight change:   Intake/Output Summary (Last 24 hours) at 11/11/12 1519 Last data filed at 11/11/12 1400  Gross per 24 hour  Intake    270 ml  Output      0 ml  Net    270 ml   Blood pressure 133/89, pulse 114, temperature 97.6 F (36.4 C), temperature source Oral, resp. rate 17, SpO2 96.00%.  Physical Exam: General: Alert and awake, dysarthria CVS: S1-S2 clear, no murmur rubs or gallops Chest: clear to auscultation bilaterally, no wheezing, rales or rhonchi Abdomen: soft nontender, nondistended, normal bowel sounds,  Extremities: no cyanosis, clubbing or edema noted bilaterally   Lab Results: Basic Metabolic Panel:  Recent Labs Lab 11/10/12 2341  NA 138  K 3.9  CL 103  CO2 22  GLUCOSE 136*  BUN 32*  CREATININE 1.38*  CALCIUM 9.9   Liver Function Tests: No results found for this basename: AST, ALT, ALKPHOS, BILITOT, PROT, ALBUMIN,  in the last 168 hours No results found for this basename: LIPASE, AMYLASE,  in the last 168  hours No results found for this basename: AMMONIA,  in the last 168 hours CBC:  Recent Labs Lab 11/10/12 2341 11/11/12 0845  WBC 11.8* 8.4  HGB 14.1 12.3*  HCT 41.6 37.5*  MCV 79.8 80.6  PLT 275 213   Cardiac Enzymes:  Recent Labs Lab 11/10/12 2342  TROPONINI <0.30   BNP: No components found with this basename: POCBNP,  CBG: No results found for this basename: GLUCAP,  in the last 168 hours   Micro Results: No results found for this or any previous visit (from the past 240 hour(s)).  Studies/Results: Dg Chest 2 View  11/11/2012  *RADIOLOGY REPORT*  Clinical Data: Chest pain.  CHEST - 2 VIEW  Comparison: 10/23/2007 and prior chest radiographs  Findings: Cardiomegaly and peribronchial thickening again noted. Left basilar scarring/density is unchanged. There is no evidence of pleural effusion, pneumothorax or pulmonary edema. No acute bony abnormalities are identified.  IMPRESSION: Cardiomegaly without evidence of acute cardiopulmonary disease.   Original Report Authenticated By: Harmon Pier, M.D.    Dg Abd 2 Views  11/11/2012  *RADIOLOGY REPORT*  Clinical Data: Abdominal pain and vomiting.  ABDOMEN - 2 VIEW  Comparison: 01/15/2011 radiographs  Findings: Gas filled loops of colon and small bowel identified. No dilated loops of bowel are present. There is no evidence of pneumoperitoneum. No suspicious calcifications are noted. A severe thoracolumbar scoliosis is again identified.  IMPRESSION: Nonspecific nonobstructive bowel gas pattern - no evidence of pneumoperitoneum.   Original Report Authenticated  By: Harmon Pier, M.D.     Medications: Scheduled Meds: . amLODipine  10 mg Oral Daily  . citalopram  5 mg Oral Daily  . cloNIDine  0.3 mg Oral BID  . docusate sodium  100 mg Oral BID  . hydrALAZINE  50 mg Oral TID  . labetalol  300 mg Oral BID  . lactulose  20 g Oral BID  . memantine  10 mg Oral BID  . pantoprazole (PROTONIX) IV  40 mg Intravenous Q12H  . simvastatin  40 mg  Oral QPM  . tamsulosin  0.4 mg Oral Daily      LOS: 1 day   RAI,RIPUDEEP M.D. [facility] 11/11/2012, 3:19 PM Pager: [phone]  If 7PM-7AM, please contact night-coverage [email or web address] Password [email or web address]

## 2012-11-11 NOTE — Op Note (Signed)
[facility] [address] [place], [address]   ENDOSCOPY PROCEDURE REPORT  PATIENT: [name], [name].  MR#: [id number] BIRTHDATE: 1946-03-21 , 66  yrs. old GENDER: Male  ENDOSCOPIST: Charlott Rakes, MD REFERRED [id number] team  PROCEDURE DATE:  11/11/2012 PROCEDURE:   EGD, diagnostic ASA CLASS:   Class III INDICATIONS:Melena.   Hematemesis. MEDICATIONS: Fentanyl 50 mcg IV and Versed 4 mg IV  TOPICAL ANESTHETIC:  DESCRIPTION OF PROCEDURE:   After the risks benefits and alternatives of the procedure were thoroughly explained, informed consent was obtained.  The Pentax Gastroscope [id number]  endoscope was introduced through the mouth and advanced to the second portion of the duodenum , limited by Without limitations.   The instrument was slowly withdrawn as the mucosa was fully examined.     FINDINGS: The endoscope was inserted into the oropharynx and esophagus was intubated.  The esophagus and the gastroesophageal junction was normal in appearance. Endoscope was advanced into the stomach, which revealed a small hiatal hernia and minimal distal erythema but otherwise normal-appearing stomach. The endoscope was advanced to the duodenal bulb and second portion of duodenum which were unremarkable.  The endoscope was withdrawn back into the stomach and retroflexion revealed the small hiatal hernia but otherwise normal proximal stomach. Clear fluid seen on EGD.  COMPLICATIONS: None  ENDOSCOPIC IMPRESSION:     1. Hiatal hernia 2. Minimal antral gastritis 3. No bleeding source seen  RECOMMENDATIONS: Abd/pelvis CT due to abdominal pain and bleeding   REPEAT EXAM: N/A  _______________________________ Charlott Rakes, MD eSigned:  Charlott Rakes, MD 11/11/2012 12:37 PM    CC:  PATIENT NAME:  [name]. MR#: [id number]

## 2012-11-11 NOTE — ED Notes (Signed)
Call received from endoscopy lab stating they will be ready for pt soon. Report given to pre-op RN. They are aware consent has not been obtained yet.

## 2012-11-11 NOTE — Consult Note (Signed)
Referring Provider: Dr. Isidoro Donning Primary Care Physician:  No primary provider on file. Primary Gastroenterologist:  Gentry Fitz  Reason for Consultation:  GI beed  HPI: [name] is a 67 y.o. male presented with acute onset of coffee grounds emesis X 2 with nausea. Epigastric abdominal pain of unclear duration. Reports 2 days of black stools but difficult to obtain history due to expressive aphasia from a previous stroke. Denies diarrhea. On aspirin but denies other NSAIDs. No known history of ulcers but he reports similar episode of bleeding years ago. Hgb 14.1, BUN 32. Vomited once in ER but color of it not known by nurse.   Past Medical History  Diagnosis Date  . Dementia   . Stroke   . Hydronephrosis   . Hypertension   . Hyperlipemia   . GERD (gastroesophageal reflux disease)   . BPH (benign prostatic hyperplasia)     No past surgical history on file.  Prior to Admission medications   Medication Sig Start Date End Date Taking? Authorizing Provider  amLODipine (NORVASC) 10 MG tablet Take 10 mg by mouth daily.   Yes Historical Provider, MD  bisacodyl (BISACODYL) 5 MG EC tablet Take 15 mg by mouth at bedtime.   Yes Historical Provider, MD  citalopram (CELEXA) 10 MG tablet Take 5 mg by mouth daily.   Yes Historical Provider, MD  cloNIDine (CATAPRES) 0.3 MG tablet Take 0.3 mg by mouth 2 (two) times daily.   Yes Historical Provider, MD  docusate sodium (COLACE) 100 MG capsule Take 100 mg by mouth 2 (two) times daily.   Yes Historical Provider, MD  hydrALAZINE (APRESOLINE) 50 MG tablet Take 50 mg by mouth 3 (three) times daily.   Yes Historical Provider, MD  labetalol (NORMODYNE) 300 MG tablet Take 300 mg by mouth 2 (two) times daily.   Yes Historical Provider, MD  lactulose (CHRONULAC) 10 GM/15ML solution Take 30 mLs by mouth 2 (two) times daily.   Yes Historical Provider, MD  memantine (NAMENDA) 10 MG tablet Take 10 mg by mouth 2 (two) times daily.   Yes Historical Provider, MD   omeprazole (PRILOSEC) 20 MG capsule Take 20 mg by mouth daily.   Yes Historical Provider, MD  simvastatin (ZOCOR) 40 MG tablet Take 40 mg by mouth every evening.   Yes Historical Provider, MD  tamsulosin (FLOMAX) 0.4 MG CAPS Take 0.4 mg by mouth daily.   Yes Historical Provider, MD    Scheduled Meds: . cloNIDine  0.3 mg Transdermal Weekly   Continuous Infusions: . sodium chloride 1,000 mL (11/11/12 0246)  . pantoprozole (PROTONIX) infusion 8 mg/hr (11/11/12 0017)   PRN Meds:.  Allergies as of 11/10/2012  . (No Known Allergies)    No family history on file.  History   Social History  . Marital Status: Single    Spouse Name: N/A    Number of Children: N/A  . Years of Education: N/A   Occupational History  . Not on file.   Social History Main Topics  . Smoking status: Never Smoker   . Smokeless tobacco: Never Used  . Alcohol Use: No  . Drug Use: No  . Sexually Active: Not on file   Other Topics Concern  . Not on file   Social History Narrative  . No narrative on file    Review of Systems: Negative from a GI standpoint except as stated above in HPI.  Physical Exam: Vital signs: Filed Vitals:   11/11/12 0800  BP: 176/95  Pulse: 116  Temp: 99.3  Resp: 16     General:   Alert,  Well-developed, well-nourished, pleasant and cooperative in NAD Lungs:  Clear throughout to auscultation.   No wheezes, crackles, or rhonchi. No acute distress. Heart:  Regular rate and rhythm; no murmurs, clicks, rubs,  or gallops. Abdomen: diffuse tenderness with guarding, soft, nondistended  Rectal:  Deferred Ext: no edema  GI:  Lab Results:  Recent Labs  11/10/12 2341  WBC 11.8*  HGB 14.1  HCT 41.6  PLT 275   BMET  Recent Labs  11/10/12 2341  NA 138  K 3.9  CL 103  CO2 22  GLUCOSE 136*  BUN 32*  CREATININE 1.38*  CALCIUM 9.9   LFT No results found for this basename: PROT, ALBUMIN, AST, ALT, ALKPHOS, BILITOT, BILIDIR, IBILI,  in the last 72  hours PT/INR  Recent Labs  11/10/12 2341  LABPROT 13.2  INR 1.01     Studies/Results: Dg Chest 2 View  11/11/2012  *RADIOLOGY REPORT*  Clinical Data: Chest pain.  CHEST - 2 VIEW  Comparison: 10/23/2007 and prior chest radiographs  Findings: Cardiomegaly and peribronchial thickening again noted. Left basilar scarring/density is unchanged. There is no evidence of pleural effusion, pneumothorax or pulmonary edema. No acute bony abnormalities are identified.  IMPRESSION: Cardiomegaly without evidence of acute cardiopulmonary disease.   Original Report Authenticated By: Harmon Pier, M.D.    Dg Abd 2 Views  11/11/2012  *RADIOLOGY REPORT*  Clinical Data: Abdominal pain and vomiting.  ABDOMEN - 2 VIEW  Comparison: 01/15/2011 radiographs  Findings: Gas filled loops of colon and small bowel identified. No dilated loops of bowel are present. There is no evidence of pneumoperitoneum. No suspicious calcifications are noted. A severe thoracolumbar scoliosis is again identified.  IMPRESSION: Nonspecific nonobstructive bowel gas pattern - no evidence of pneumoperitoneum.   Original Report Authenticated By: Harmon Pier, M.D.     Impression/Plan: 67 yo presenting with coffee grounds emesis and melena for 2 days concerning for a peptic ulcer bleed. EGD today. Continue Protonix infusion. NPO. Volume resuscitate.    LOS: 1 day   Lashanda Storlie C.  11/11/2012, 8:24 AM

## 2012-11-12 ENCOUNTER — Encounter (HOSPITAL_COMMUNITY): Payer: Self-pay | Admitting: Gastroenterology

## 2012-11-12 ENCOUNTER — Inpatient Hospital Stay (HOSPITAL_COMMUNITY): Payer: Medicare Other

## 2012-11-12 LAB — HEMOGLOBIN AND HEMATOCRIT, BLOOD
HCT: 32.4 % — ABNORMAL LOW (ref 39.0–52.0)
HCT: 31.8 % — ABNORMAL LOW (ref 39.0–52.0)
Hemoglobin: 10.5 g/dL — ABNORMAL LOW (ref 13.0–17.0)

## 2012-11-12 LAB — BASIC METABOLIC PANEL
Calcium: 8.2 mg/dL — ABNORMAL LOW (ref 8.4–10.5)
GFR calc Af Amer: 53 mL/min — ABNORMAL LOW (ref >90)
GFR calc non Af Amer: 45 mL/min — ABNORMAL LOW (ref >90)
Sodium: 140 mEq/L (ref 135–145)

## 2012-11-12 LAB — PRO B NATRIURETIC PEPTIDE: Pro B Natriuretic peptide (BNP): 818.8 pg/mL — ABNORMAL HIGH (ref 0–125)

## 2012-11-12 MED ORDER — SODIUM CHLORIDE 0.9 % IV BOLUS (SEPSIS)
500.0000 mL | Freq: Once | INTRAVENOUS | Status: AC
  Administered 2012-11-12: 500 mL via INTRAVENOUS

## 2012-11-12 MED ORDER — CLONIDINE HCL 0.1 MG PO TABS
0.1000 mg | ORAL_TABLET | Freq: Two times a day (BID) | ORAL | Status: DC
  Administered 2012-11-13 – 2012-11-14 (×4): 0.1 mg via ORAL
  Filled 2012-11-12 (×7): qty 1

## 2012-11-12 NOTE — Progress Notes (Signed)
2 d-echo performed. Cardiac Sonographer informed me of pts pericardial effusion. Spoke with Dr. Isidoro Donning to let her know, orders to hold transfer for now. Will cont to monitor.

## 2012-11-12 NOTE — Progress Notes (Signed)
Concerned with low blood pressure compared to patient's baseline. Dr. Clearence Ped called and made aware. Ordered to remove Clonidine patch. Will continue to monitor. [name]

## 2012-11-12 NOTE — Consult Note (Addendum)
Admit date: 11/10/2012 Referring Physician  Dr. Isidoro Donning Primary Physician No primary provider on file. Primary Cardiologist  None  Reason for Consultation  pericardial effusion  HPI: 67 year old male admitted with acute GI bleed with history of hypertension, dementia, GERD, Jasmine December with right-sided hemiparesis as well as expressive aphasia who underwent CT of his abdomen which showed cardiac enlargement with pericardial effusion. An echocardiogram was performed which demonstrated a large pericardial effusion with possible inhibition of his right atrial free wall with no evidence of right ventricular collapse, unable to visualize IVC. We were asked to see in consultation based upon this effusion. His ejection fraction also was reduced in the 40% range on echocardiogram. Perhaps hypertensive cardiomyopathy. Perhaps hypertensive cardiomyopathy. Hemoglobin is stable at 10.5.  EGD was done which showed no bleeding source. He reported that he had not smoked, no alcohol, no drugs, no recent weight loss reported. Creatinine was 1.53.  He currently does not report any chest pain, no shortness of breath, no syncope , no dizziness. Recent episode of nausea vomiting coffee-ground emesis.    PMH:   Past Medical History  Diagnosis Date  . Dementia   . Stroke   . Hydronephrosis   . Hypertension   . Hyperlipemia   . GERD (gastroesophageal reflux disease)   . BPH (benign prostatic hyperplasia)     PSH:   Past Surgical History  Procedure Laterality Date  . Esophagogastroduodenoscopy N/A 11/11/2012    Procedure: ESOPHAGOGASTRODUODENOSCOPY (EGD);  Surgeon: [name], MD;  Location: [facility] ENDOSCOPY;  Service: Endoscopy;  Laterality: N/A;   Allergies:  Review of patient's allergies indicates no known allergies. Prior to Admit Meds:   Prescriptions prior to admission  Medication Sig Dispense Refill  . amLODipine (NORVASC) 10 MG tablet Take 10 mg by mouth daily.      . bisacodyl (BISACODYL) 5 MG EC  tablet Take 15 mg by mouth at bedtime.      . citalopram (CELEXA) 10 MG tablet Take 5 mg by mouth daily.      . cloNIDine (CATAPRES) 0.3 MG tablet Take 0.3 mg by mouth 2 (two) times daily.      Marland Kitchen docusate sodium (COLACE) 100 MG capsule Take 100 mg by mouth 2 (two) times daily.      . hydrALAZINE (APRESOLINE) 50 MG tablet Take 50 mg by mouth 3 (three) times daily.      Marland Kitchen labetalol (NORMODYNE) 300 MG tablet Take 300 mg by mouth 2 (two) times daily.      Marland Kitchen lactulose (CHRONULAC) 10 GM/15ML solution Take 30 mLs by mouth 2 (two) times daily.      . memantine (NAMENDA) 10 MG tablet Take 10 mg by mouth 2 (two) times daily.      Marland Kitchen omeprazole (PRILOSEC) 20 MG capsule Take 20 mg by mouth daily.      . simvastatin (ZOCOR) 40 MG tablet Take 40 mg by mouth every evening.      . tamsulosin (FLOMAX) 0.4 MG CAPS Take 0.4 mg by mouth daily.       Fam HX:   History reviewed. No pertinent family history. Social HX:    History   Social History  . Marital Status: Single    Spouse Name: N/A    Number of Children: N/A  . Years of Education: N/A   Occupational History  . Not on file.   Social History Main Topics  . Smoking status: Never Smoker   . Smokeless tobacco: Never Used  . Alcohol Use: No  .  Drug Use: No  . Sexually Active: Not on file   Other Topics Concern  . Not on file   Social History Narrative  . No narrative on file     ROS:  All 11 ROS were addressed and are negative except what is stated in the HPI  Physical Exam: Blood pressure 131/68, pulse 79, temperature 98.6 F (37 C), temperature source Oral, resp. rate 16, weight 93.5 kg (206 lb 2.1 oz), SpO2 100.00%.    General: Well developed, well nourished, in no acute distress, difficulty expressing himself Head: Eyes PERRLA, No xanthomas.   Normal cephalic and atramatic  Lungs:   Clear bilaterally to auscultation and percussion. Normal respiratory effort. No wheezes, no rales. Heart:   HRRR S1 S2 Pulses are 2+ & equal. No  significant murmurs, No carotid bruit. No JVD.  No abdominal bruits. Abdomen: Bowel sounds are positive, abdomen soft and non-tender without masses. No hepatosplenomegaly. Msk:  Back normal.  Extremities:   No clubbing, cyanosis or edema.  DP +1 Neuro: Alert in no distress, muscle weakness noted. GU: Deferred Rectal: Deferred Psych:  Tries to responds appropriately    Labs:   Lab Results  Component Value Date   WBC 8.4 11/11/2012   HGB 10.5* 11/12/2012   HCT 31.8* 11/12/2012   MCV 80.6 11/11/2012   PLT 213 11/11/2012    Recent Labs Lab 11/11/12 1703 11/12/12 0121  NA  --  140  K  --  4.1  CL  --  112  CO2  --  21  BUN  --  23  CREATININE  --  1.53*  CALCIUM  --  8.2*  PROT 6.5  --   BILITOT 0.2*  --   ALKPHOS 46  --   ALT 10  --   AST 17  --   GLUCOSE  --  139*   No results found for this basename: PTT   Lab Results  Component Value Date   INR 1.01 11/10/2012   INR 1.1 10/10/2007   INR 1.0 10/06/2007   Lab Results  Component Value Date   TROPONINI <0.30 11/10/2012   Albumin 3.1. Creatinine 1.5. Troponin 0. 3. TSH pending. On 10/12/07 it was 0.2.   Radiology:  Ct Abdomen Pelvis Wo Contrast  11/11/2012  *RADIOLOGY REPORT*  Clinical Data: Distended abdomen with abdominal pain.  CT ABDOMEN AND PELVIS WITHOUT CONTRAST  Technique:  Multidetector CT imaging of the abdomen and pelvis was performed following the standard protocol without intravenous contrast.  Comparison: 12/10/2009  Findings: Small bilateral pleural effusions with atelectasis or infiltration in both lung bases.  Moderate sized pericardial effusion.  Cardiac enlargement.  Moderate sized esophageal hiatal hernia.  The unenhanced appearance of the liver, spleen, gallbladder, pancreas, adrenal glands, and retroperitoneal lymph nodes is unremarkable.  Mild calcification of the abdominal aorta without aneurysm.  Multiple parenchymal cysts in the right kidney corresponding to the previous study.  Largest is in the upper  pole and measures 1.7 cm.  There is a hyper dense lesion in the upper pole of the right kidney posteriorly measuring 7 mm consistent with a hemorrhagic cyst.  No hydronephrosis of the right kidney.  The left kidney demonstrates marked hydronephrosis and ureterectasis with dilated and tortuous aorta down to the level of the bladder. There is marked parenchymal atrophy in the left kidney suggesting that this represents a longstanding process.  The appearance is similar to the previous study, allowing for technical differences. The stomach, small bowel, and colon are  not abnormally distended. No abdominal ascites.  Pelvis:  There is marked distension of the bladder suggesting possible bladder outlet obstruction versus dysmotility.  The prostate gland is not appear to be enlarged.  The appearance is stable since the previous study.  No free or loculated pelvic fluid collections.  The rectum is decompressed but there is apparent thickening of the wall of the anorectal junction.  Was present previously.  This could represent chronic inflammatory process. Mass lesion can also have this appearance.  The appendix is normal. No diverticulitis.  Lumbar scoliosis convex towards the right.  Degenerative changes in the lumbar spine and hips.  IMPRESSION: Small bilateral pleural effusions with basilar atelectasis or infiltration.  Cardiac enlargement with pericardial effusion. Prominent pyelocaliectasis and ureterectasis of the left kidney with parenchymal atrophy.  Distended bladder without wall thickening.  Thickening of the wall of the anorectal junction. These findings are similar to previous study.  Clinical appearance of abdominal distension is likely to be due to the distended bladder.   Original Report Authenticated By: Burman Nieves, M.D.    Dg Chest 2 View  11/11/2012  *RADIOLOGY REPORT*  Clinical Data: Chest pain.  CHEST - 2 VIEW  Comparison: 10/23/2007 and prior chest radiographs  Findings: Cardiomegaly and  peribronchial thickening again noted. Left basilar scarring/density is unchanged. There is no evidence of pleural effusion, pneumothorax or pulmonary edema. No acute bony abnormalities are identified.  IMPRESSION: Cardiomegaly without evidence of acute cardiopulmonary disease.   Original Report Authenticated By: Harmon Pier, M.D.    Dg Abd 2 Views  11/11/2012  *RADIOLOGY REPORT*  Clinical Data: Abdominal pain and vomiting.  ABDOMEN - 2 VIEW  Comparison: 01/15/2011 radiographs  Findings: Gas filled loops of colon and small bowel identified. No dilated loops of bowel are present. There is no evidence of pneumoperitoneum. No suspicious calcifications are noted. A severe thoracolumbar scoliosis is again identified.  IMPRESSION: Nonspecific nonobstructive bowel gas pattern - no evidence of pneumoperitoneum.   Original Report Authenticated By: Harmon Pier, M.D.    Personally viewed.  EKG:  Sinus tachycardia, left bundle branch block, heart rate 124 beats per minute, left atrial enlargement from 2/26. Personally viewed.   ASSESSMENT/PLAN:   67 year old male with newly discovered large pericardial effusion, recent GI bleed, prior stroke, dementia, hyperlipidemia, difficult to control hypertension as well as newly discovered decreased ejection fraction, cardiomyopathy.  1. Pericardial effusion - there is no overt evidence suggestive of cardiac tamponade from both clinical and echocardiographic evidence. There is minor suggestion of influence of raised intrapericardial pressures on the right atrial free wall. This is not severely inverted nor is the right ventricle influenced. IVC was not visualized. Ejection fraction is 40% range. I will begin by mouth past midnight and consult TCTS for possible pericardial window for both tissue as well as cytology diagnosis. With this large effusion, it is likely that this is been somewhat long-standing or gradually developing. If he becomes unstable, please let us know.  2.  Newly discovered cardiomyopathy-checking TSH, likely hypertensive in etiology. We will continue to follow. If resolution does not occur after adequate medical therapy and hypertension control, one could consider cardiac catheterization as an outpatient.  3. Stroke-has difficulty expressing.  4. Left bundle branch block-noted on EKG. Goes along with cardiomyopathy.  5. Hypertension-medical therapy reviewed. Donato Schultz, MD  11/12/2012  7:09 PM

## 2012-11-12 NOTE — Evaluation (Signed)
SLP reviewed and agree with student findings.   Quadasia Newsham MA, CCC-SLP [phone]    

## 2012-11-12 NOTE — Progress Notes (Signed)
Patient ID: [name], male   DOB: Feb 03, 1946, 67 y.o.   MRN: [id number] [facility] Gastroenterology Progress Note  [name] 67 y.o. 06/14/1946   Subjective: Sitting in chair sleeping but easily arousable. No melena or BMs. Tolerating clears.  Objective: Vital signs: Filed Vitals:   11/12/12 0900  BP: 129/58  Pulse: 92  Temp: 98.3  Resp:     Physical Exam: Gen: no acute distress  Abd: soft, nontender, nondistended  Lab Results:  Recent Labs  11/10/12 2341 11/12/12 0121  NA 138 140  K 3.9 4.1  CL 103 112  CO2 22 21  GLUCOSE 136* 139*  BUN 32* 23  CREATININE 1.38* 1.53*  CALCIUM 9.9 8.2*    Recent Labs  11/11/12 1703  AST 17  ALT 10  ALKPHOS 46  BILITOT 0.2*  PROT 6.5  ALBUMIN 3.1*    Recent Labs  11/10/12 2341 11/11/12 0845  11/12/12 0121 11/12/12 0700  WBC 11.8* 8.4  --   --   --   HGB 14.1 12.3*  < > 10.5* 10.3*  HCT 41.6 37.5*  < > 32.4* 31.5*  MCV 79.8 80.6  --   --   --   PLT 275 213  --   --   --   < > = values in this interval not displayed.    Assessment/Plan: S/P GI bleed - no further bleeding. CT unremarkable for source of abdominal pain. No further GI workup. Defer diet recs to [facility] due to history of stroke unclear to me what level of diet he has tolerated previously. Will sign off. Call if questions.   Kyonna Frier C. 11/12/2012, 10:37 AM

## 2012-11-12 NOTE — Progress Notes (Signed)
Patient ID: [name]  male  AVW:[id number]    DOB: [birth year]    DOA: 11/10/2012  PCP: No primary provider on file.  Assessment/Plan: Principal Problem:   Hematemesis/vomiting blood, upper GI bleed: resolved - EGD done showed hiatal hernia, minimal antral gastritis, no bleeding source - CT abdomen and pelvis was done which showed small bilateral pleural effusions, cardiac enlargement with pericardial effusion - on clear liquid diet for now, IV PPI, patient says that he was on regular diet prior to the admission, will obtain swallow evaluation  Active Problems:   Hypertension, accelerated - now improved, but had low BP overnight, dc norvasc, decrease clonidine - CT abdomen and pelvis showed a cardiac enlargement with pericardial effusion, will obtain 2-D echocardiogram     Dementia    GERD (gastroesophageal reflux disease): See #1    Hemiparesis affecting right side as late effect of stroke,  Expressive aphasia: Appears to be from the prior stroke - patient states that he feels more weaker, will obtain CT head to rule out any new stroke, PT OT eval, swallow evaluation   DVT Prophylaxis: SCDs  Code Status:  Disposition:Hopefully tomorrow if improving . Transfer to telemetry floor for now  Subjective: Sitting up in the chair, tolerating clears, no active bleeding, hemoglobin stable   Objective: Weight change:   Intake/Output Summary (Last 24 hours) at 11/12/12 1358 Last data filed at 11/12/12 0800  Gross per 24 hour  Intake   1745 ml  Output    200 ml  Net   1545 ml   Blood pressure 106/58, pulse 88, temperature 97.9 F (36.6 C), temperature source Oral, resp. rate 22, weight 93.5 kg (206 lb 2.1 oz), SpO2 100.00%.  Physical Exam: General: Alert and awake, dysarthria CVS: S1-S2 clear, no murmur rubs or gallops Chest: clear to auscultation bilaterally, no wheezing, rales or rhonchi Abdomen: soft nontender, nondistended, normal bowel sounds,  Extremities: no  cyanosis, clubbing or edema noted bilaterally Neuro: right-sided weakness (from prior stroke), dysarthria   Lab Results: Basic Metabolic Panel:  Recent Labs Lab 11/10/12 2341 11/12/12 0121  NA 138 140  K 3.9 4.1  CL 103 112  CO2 22 21  GLUCOSE 136* 139*  BUN 32* 23  CREATININE 1.38* 1.53*  CALCIUM 9.9 8.2*   Liver Function Tests:  Recent Labs Lab 11/11/12 1703  AST 17  ALT 10  ALKPHOS 46  BILITOT 0.2*  PROT 6.5  ALBUMIN 3.1*   No results found for this basename: LIPASE, AMYLASE,  in the last 168 hours No results found for this basename: AMMONIA,  in the last 168 hours CBC:  Recent Labs Lab 11/10/12 2341 11/11/12 0845  11/12/12 0700 11/12/12 1259  WBC 11.8* 8.4  --   --   --   HGB 14.1 12.3*  < > 10.3* 10.5*  HCT 41.6 37.5*  < > 31.5* 31.8*  MCV 79.8 80.6  --   --   --   PLT 275 213  --   --   --   < > = values in this interval not displayed. Cardiac Enzymes:  Recent Labs Lab 11/10/12 2342  TROPONINI <0.30   BNP: No components found with this basename: POCBNP,  CBG: No results found for this basename: GLUCAP,  in the last 168 hours   Micro Results: Recent Results (from the past 240 hour(s))  MRSA PCR SCREENING     Status: None   Collection Time    11/11/12  1:24 PM  Result Value Range Status   MRSA by PCR NEGATIVE  NEGATIVE Final   Comment:            The GeneXpert MRSA Assay (FDA     approved for NASAL specimens     only), is one component of a     comprehensive MRSA colonization     surveillance program. It is not     intended to diagnose MRSA     infection nor to guide or     monitor treatment for     MRSA infections.    Studies/Results: Dg Chest 2 View  11/11/2012  *RADIOLOGY REPORT*  Clinical Data: Chest pain.  CHEST - 2 VIEW  Comparison: 10/23/2007 and prior chest radiographs  Findings: Cardiomegaly and peribronchial thickening again noted. Left basilar scarring/density is unchanged. There is no evidence of pleural effusion,  pneumothorax or pulmonary edema. No acute bony abnormalities are identified.  IMPRESSION: Cardiomegaly without evidence of acute cardiopulmonary disease.   Original Report Authenticated By: Harmon Pier, M.D.    Dg Abd 2 Views  11/11/2012  *RADIOLOGY REPORT*  Clinical Data: Abdominal pain and vomiting.  ABDOMEN - 2 VIEW  Comparison: 01/15/2011 radiographs  Findings: Gas filled loops of colon and small bowel identified. No dilated loops of bowel are present. There is no evidence of pneumoperitoneum. No suspicious calcifications are noted. A severe thoracolumbar scoliosis is again identified.  IMPRESSION: Nonspecific nonobstructive bowel gas pattern - no evidence of pneumoperitoneum.   Original Report Authenticated By: Harmon Pier, M.D.     Medications: Scheduled Meds: . citalopram  5 mg Oral Daily  . cloNIDine  0.1 mg Oral BID  . docusate sodium  100 mg Oral BID  . hydrALAZINE  50 mg Oral TID  . labetalol  300 mg Oral BID  . lactulose  20 g Oral BID  . memantine  10 mg Oral BID  . pantoprazole (PROTONIX) IV  40 mg Intravenous Q12H  . simvastatin  40 mg Oral QPM  . tamsulosin  0.4 mg Oral Daily      LOS: 2 days   Landrie Beale M.D. [facility] 11/12/2012, 1:58 PM Pager: [phone]  If 7PM-7AM, please contact night-coverage [email or web address] Password [email or web address]

## 2012-11-12 NOTE — Clinical Social Work Psychosocial (Signed)
     Clinical Social Work Department BRIEF PSYCHOSOCIAL ASSESSMENT 11/12/2012  Patient:  [name], [name]     Account Number:  [id number]     Admit date:  11/10/2012  Clinical Social Worker:  Hulan Fray  Date/Time:  11/12/2012 03:50 PM  Referred by:  Care Management  Date Referred:  11/11/2012 Referred for  SNF Placement   Other Referral:   Interview type:  Patient Other interview type:    PSYCHOSOCIAL DATA Living Status:  FACILITY Admitted from facility:  [facility] Level of care:  Skilled Nursing Facility Primary support name:  Traeton Bordas Primary support relationship to patient:   Degree of support available:    CURRENT CONCERNS Current Concerns  Post-Acute Placement   Other Concerns:    SOCIAL WORK ASSESSMENT / PLAN Clinical Social Worker received referral for patient being admitted from SNF. CSW introduced self and explained reason for visit. Patient was a little difficult to understand during assessment.    Patient reported that he is a long term resident and has been residing at that facility for nine years. Patient also stated that he has two daughers, one in [place] and the other in [place] that he has not seen.    CSW spoke with Marylene Land at [facility] and they confirmed that patient is a long term resident and is willing to accept him back when medically stable.  CSW will complete FL2 for MD's signature and will continue to follow.   Assessment/plan status:  Psychosocial Support/Ongoing Assessment of Needs Other assessment/ plan:   Information/referral to community resources:   Patient is from facility.    PATIENTS/FAMILYS RESPONSE TO PLAN OF CARE: Patient reported that he is a long term resident of [address] and plans to return back at discharge.

## 2012-11-12 NOTE — Progress Notes (Addendum)
  Echocardiogram 2D Echocardiogram has been performed. Informed RN that echo was ordered due to pericardial effusion found on CT.   [name] FRANCES 11/12/2012, 5:38 PM

## 2012-11-12 NOTE — Evaluation (Signed)
Clinical/Bedside Swallow Evaluation Patient Details  Name: [name] MRN: [id number] Date of Birth: 05-11-1946  Today's Date: 11/12/2012 Time: 1478-2956 SLP Time Calculation (min): 25 min  Past Medical History:  Past Medical History  Diagnosis Date  . Dementia   . Stroke   . Hydronephrosis   . Hypertension   . Hyperlipemia   . GERD (gastroesophageal reflux disease)   . BPH (benign prostatic hyperplasia)    Past Surgical History:  Past Surgical History  Procedure Laterality Date  . Esophagogastroduodenoscopy N/A 11/11/2012    Procedure: ESOPHAGOGASTRODUODENOSCOPY (EGD);  Surgeon: [name], MD;  Location: [facility] ENDOSCOPY;  Service: Endoscopy;  Laterality: N/A;   HPI:  [name] is a 67 y.o. male presented 2/25 with acute onset of coffee grounds emesis X 2 with nausea. Epigastric abdominal pain of unclear duration. Reports 2 days of black stools but difficult to obtain history due to expressive aphasia from a previous stroke. Denies diarrhea. Recent endoscopy performed 2/26, CT ordered to rule out any new stroke. Dr. Andres Labrum ordered swallow eval 2/27   Assessment / Plan / Recommendation Clinical Impression  Patient presents with a mild oral phase dysphagia characterized by right buccal pocketing and occassional right anterior loss, however patient does not exhibit overt s/s of aspiration and oral mechanisms functional for independent clearance of oral cavity. SLP provided education for use of compensatory strategies including checking for anterior loss of bolus and utilizing finger and/or linigual sweep and liquid wash to facilitate clearance of bolus from right buccal cavity.  Suspect that swallowing function is currently at baseline from previous CVA. 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 SLP recommends patient upgrade to regular diet with thin liquids with use of compensatory strategies to check  for pocketing and follow bites with liquid wash. SLP communicated recommendations with patient and RN. No f/u needed at this time, however please contact SLP if needed.    Aspiration Risk  Mild    Diet Recommendation Regular;Thin liquid   Liquid Administration via: Cup;Straw Medication Administration: Whole meds with liquid Supervision: Patient able to self feed;Intermittent supervision to cue for compensatory strategies Compensations: Slow rate;Small sips/bites;Follow solids with liquid;Check for pocketing;Check for anterior loss Postural Changes and/or Swallow Maneuvers: Seated upright 90 degrees;Upright 30-60 min after meal    Other  Recommendations Oral Care Recommendations: Oral care BID   Follow Up Recommendations  None    Frequency and Duration        Pertinent Vitals/Pain None reported    SLP Swallow Goals     Swallow Study Prior Functional Status       General Date of Onset: 11/10/12 HPI: [name] is a 68 y.o. male presented 2/25 with acute onset of coffee grounds emesis X 2 with nausea. Epigastric abdominal pain of unclear duration. Reports 2 days of black stools but difficult to obtain history due to expressive aphasia from a previous stroke. Denies diarrhea. Recent endoscopy performed 2/26, CT ordered to rule out any new stroke. Dr. Andres Labrum ordered swallow eval 2/27 Type of Study: Bedside swallow evaluation Previous Swallow Assessment: none found Diet Prior to this Study: Thin liquids Temperature Spikes Noted: No Respiratory Status: Room air History of Recent Intubation: No Behavior/Cognition: Cooperative;Pleasant mood (expressive aphasia) Oral Cavity - Dentition: Missing dentition Self-Feeding Abilities: Able to feed self (R hemiparesis) Patient Positioning: Upright in bed Baseline Vocal Quality: Clear Volitional Cough: Cognitively unable to elicit (apraxic, patient puckered lips when asked  to cough) Volitional Swallow: Able to elicit     Oral/Motor/Sensory Function Overall Oral Motor/Sensory Function: Impaired at baseline Labial ROM: Reduced right Labial Symmetry: Abnormal symmetry right Labial Strength: Within Functional Limits Labial Sensation: Reduced Lingual ROM: Within Functional Limits Lingual Symmetry: Within Functional Limits Lingual Strength: Within Functional Limits Lingual Sensation: Within Functional Limits Facial ROM: Reduced right Facial Symmetry: Within Functional Limits Facial Strength: Reduced Facial Sensation: Within Functional Limits Velum: Within Functional Limits Mandible: Within Functional Limits   Ice Chips Ice chips: Within functional limits Presentation: Cup;Self Fed;Spoon   Thin Liquid Thin Liquid: Within functional limits Presentation: Cup;Self Fed;Straw    Nectar Thick Nectar Thick Liquid: Not tested   Honey Thick Honey Thick Liquid: Not tested   Puree Puree: Within functional limits Presentation: Self Fed;Spoon   Solid   GO   [name] SLP student Solid: Within functional limits Presentation: Self Fed Other Comments: trace oral residue and occassional anterior spillage; at baseline       [name] 11/12/2012,4:41 PM

## 2012-11-13 ENCOUNTER — Inpatient Hospital Stay (HOSPITAL_COMMUNITY): Payer: Medicare Other | Admitting: Certified Registered"

## 2012-11-13 ENCOUNTER — Encounter (HOSPITAL_COMMUNITY): Payer: Self-pay | Admitting: Anesthesiology

## 2012-11-13 ENCOUNTER — Encounter (HOSPITAL_COMMUNITY)
Admission: EM | Disposition: A | Discharge status: Home / Self Care | Payer: Self-pay | Source: Home / Self Care | Attending: Internal Medicine

## 2012-11-13 ENCOUNTER — Encounter (HOSPITAL_COMMUNITY): Payer: Self-pay | Admitting: Certified Registered"

## 2012-11-13 DIAGNOSIS — I313 Pericardial effusion (noninflammatory): Secondary | ICD-10-CM | POA: Diagnosis present

## 2012-11-13 DIAGNOSIS — I309 Acute pericarditis, unspecified: Secondary | ICD-10-CM

## 2012-11-13 HISTORY — PX: SUBXYPHOID PERICARDIAL WINDOW: SHX5075

## 2012-11-13 LAB — CBC
HCT: 31.4 % — ABNORMAL LOW (ref 39.0–52.0)
Hemoglobin: 10.2 g/dL — ABNORMAL LOW (ref 13.0–17.0)
MCH: 26.4 pg (ref 26.0–34.0)
MCV: 81.1 fL (ref 78.0–100.0)
Platelets: 187 10*3/uL (ref 150–400)
RBC: 3.87 MIL/uL — ABNORMAL LOW (ref 4.22–5.81)

## 2012-11-13 LAB — BASIC METABOLIC PANEL
BUN: 22 mg/dL (ref 6–23)
CO2: 21 mEq/L (ref 19–32)
Calcium: 8.3 mg/dL — ABNORMAL LOW (ref 8.4–10.5)
Chloride: 111 mEq/L (ref 96–112)
Creatinine, Ser: 1.62 mg/dL — ABNORMAL HIGH (ref 0.50–1.35)
Glucose, Bld: 121 mg/dL — ABNORMAL HIGH (ref 70–99)

## 2012-11-13 LAB — LACTATE DEHYDROGENASE, PLEURAL OR PERITONEAL FLUID: LD, Fluid: 165 U/L — ABNORMAL HIGH (ref 3–23)

## 2012-11-13 LAB — GLUCOSE, SEROUS FLUID: Glucose, Fluid: 116 mg/dL

## 2012-11-13 LAB — BODY FLUID CELL COUNT WITH DIFFERENTIAL: Lymphs, Fluid: 75 %

## 2012-11-13 LAB — PROTEIN, BODY FLUID: Total protein, fluid: 3.9 g/dL

## 2012-11-13 SURGERY — CREATION, PERICARDIAL WINDOW, SUBXIPHOID APPROACH
Anesthesia: General | Site: Chest | Wound class: Clean

## 2012-11-13 MED ORDER — LIDOCAINE HCL (CARDIAC) 20 MG/ML IV SOLN
INTRAVENOUS | Status: DC | PRN
  Administered 2012-11-13: 100 mg via INTRAVENOUS

## 2012-11-13 MED ORDER — HYDROMORPHONE HCL PF 1 MG/ML IJ SOLN: 0.2500 mg | INTRAMUSCULAR | Status: DC | PRN

## 2012-11-13 MED ORDER — ACETAMINOPHEN 325 MG PO TABS
650.0000 mg | ORAL_TABLET | ORAL | Status: DC | PRN
  Administered 2012-11-15: 650 mg via ORAL
  Filled 2012-11-13: qty 2

## 2012-11-13 MED ORDER — HYDROMORPHONE HCL PF 1 MG/ML IJ SOLN: 1.0000 mg | INTRAMUSCULAR | Status: DC | PRN

## 2012-11-13 MED ORDER — OXYCODONE-ACETAMINOPHEN 5-325 MG PO TABS
1.0000 | ORAL_TABLET | ORAL | Status: DC | PRN
  Administered 2012-11-13 – 2012-11-14 (×2): 1 via ORAL
  Filled 2012-11-13 (×2): qty 1

## 2012-11-13 MED ORDER — DEXTROSE 5 % IV SOLN
INTRAVENOUS | Status: AC
  Filled 2012-11-13: qty 50

## 2012-11-13 MED ORDER — CEFUROXIME SODIUM 1.5 G IJ SOLR
INTRAMUSCULAR | Status: AC
  Filled 2012-11-13: qty 1.5

## 2012-11-13 MED ORDER — PROPOFOL 10 MG/ML IV BOLUS
INTRAVENOUS | Status: DC | PRN
  Administered 2012-11-13: 150 mg via INTRAVENOUS

## 2012-11-13 MED ORDER — NEOSTIGMINE METHYLSULFATE 1 MG/ML IJ SOLN
INTRAMUSCULAR | Status: DC | PRN
  Administered 2012-11-13: 4 mg via INTRAVENOUS

## 2012-11-13 MED ORDER — PROMETHAZINE HCL 25 MG/ML IJ SOLN: 6.2500 mg | INTRAMUSCULAR | Status: DC | PRN

## 2012-11-13 MED ORDER — MEPERIDINE HCL 25 MG/ML IJ SOLN: 6.2500 mg | INTRAMUSCULAR | Status: DC | PRN

## 2012-11-13 MED ORDER — ROCURONIUM BROMIDE 100 MG/10ML IV SOLN
INTRAVENOUS | Status: DC | PRN
  Administered 2012-11-13: 10 mg via INTRAVENOUS
  Administered 2012-11-13: 40 mg via INTRAVENOUS

## 2012-11-13 MED ORDER — LACTATED RINGERS IV SOLN: INTRAVENOUS | Status: DC

## 2012-11-13 MED ORDER — PHENYLEPHRINE HCL 10 MG/ML IJ SOLN
10.0000 mg | INTRAVENOUS | Status: DC | PRN
  Administered 2012-11-13: 25 ug/min via INTRAVENOUS

## 2012-11-13 MED ORDER — ONDANSETRON HCL 4 MG/2ML IJ SOLN
INTRAMUSCULAR | Status: DC | PRN
  Administered 2012-11-13: 4 mg via INTRAVENOUS

## 2012-11-13 MED ORDER — OXYCODONE HCL 5 MG/5ML PO SOLN: 5.0000 mg | Freq: Once | ORAL | Status: DC | PRN

## 2012-11-13 MED ORDER — OXYCODONE HCL 5 MG PO TABS: 5.0000 mg | ORAL_TABLET | Freq: Once | ORAL | Status: DC | PRN

## 2012-11-13 MED ORDER — LACTATED RINGERS IV SOLN
INTRAVENOUS | Status: DC | PRN
  Administered 2012-11-13: 11:00:00 via INTRAVENOUS

## 2012-11-13 MED ORDER — FENTANYL CITRATE 0.05 MG/ML IJ SOLN
INTRAMUSCULAR | Status: DC | PRN
  Administered 2012-11-13 (×2): 50 ug via INTRAVENOUS

## 2012-11-13 MED ORDER — GLYCOPYRROLATE 0.2 MG/ML IJ SOLN
INTRAMUSCULAR | Status: DC | PRN
  Administered 2012-11-13: 0.6 mg via INTRAVENOUS

## 2012-11-13 MED ORDER — DEXTROSE 5 % IV SOLN
1.5000 g | INTRAVENOUS | Status: AC
  Administered 2012-11-13: 1.5 g via INTRAVENOUS
  Filled 2012-11-13: qty 1.5

## 2012-11-13 MED ORDER — 0.9 % SODIUM CHLORIDE (POUR BTL) OPTIME
TOPICAL | Status: DC | PRN
  Administered 2012-11-13: 1000 mL

## 2012-11-13 MED ORDER — LABETALOL HCL 5 MG/ML IV SOLN
10.0000 mg | INTRAVENOUS | Status: DC | PRN
  Filled 2012-11-13: qty 4

## 2012-11-13 SURGICAL SUPPLY — 54 items
ADH SKN CLS APL DERMABOND .7 (GAUZE/BANDAGES/DRESSINGS) ×1
APL SKNCLS STERI-STRIP NONHPOA (GAUZE/BANDAGES/DRESSINGS)
BENZOIN TINCTURE PRP APPL 2/3 (GAUZE/BANDAGES/DRESSINGS) IMPLANT
CANISTER SUCTION 2500CC (MISCELLANEOUS) ×2 IMPLANT
CATH THORACIC 28FR (CATHETERS) IMPLANT
CATH THORACIC 28FR RT ANG (CATHETERS) IMPLANT
CATH THORACIC 36FR (CATHETERS) IMPLANT
CATH THORACIC 36FR RT ANG (CATHETERS) IMPLANT
CLOTH BEACON ORANGE TIMEOUT ST (SAFETY) ×2 IMPLANT
CONN ST 1/4X3/8  BEN (MISCELLANEOUS) ×1
CONN ST 1/4X3/8 BEN (MISCELLANEOUS) ×1 IMPLANT
CONT SPEC 4OZ CLIKSEAL STRL BL (MISCELLANEOUS) ×4 IMPLANT
COVER SURGICAL LIGHT HANDLE (MISCELLANEOUS) ×2 IMPLANT
DERMABOND ADVANCED (GAUZE/BANDAGES/DRESSINGS) ×1
DERMABOND ADVANCED .7 DNX12 (GAUZE/BANDAGES/DRESSINGS) ×1 IMPLANT
DRAIN CHANNEL 28F RND 3/8 FF (WOUND CARE) ×2 IMPLANT
DRAPE LAPAROSCOPIC ABDOMINAL (DRAPES) ×2 IMPLANT
ELECT BLADE 4.0 EZ CLEAN MEGAD (MISCELLANEOUS) ×2
ELECT REM PT RETURN 9FT ADLT (ELECTROSURGICAL) ×2
ELECTRODE BLDE 4.0 EZ CLN MEGD (MISCELLANEOUS) IMPLANT
ELECTRODE REM PT RTRN 9FT ADLT (ELECTROSURGICAL) ×1 IMPLANT
GLOVE BIO SURGEON STRL SZ 6 (GLOVE) ×1 IMPLANT
GLOVE BIO SURGEON STRL SZ 6.5 (GLOVE) ×5 IMPLANT
GLOVE BIOGEL PI IND STRL 7.0 (GLOVE) IMPLANT
GLOVE BIOGEL PI INDICATOR 7.0 (GLOVE) ×3
GOWN DECONTAM LG NS DISP (GOWN DISPOSABLE) ×3 IMPLANT
HEMOSTAT POWDER SURGIFOAM 1G (HEMOSTASIS) IMPLANT
KIT BASIN OR (CUSTOM PROCEDURE TRAY) ×2 IMPLANT
KIT ROOM TURNOVER OR (KITS) ×2 IMPLANT
NS IRRIG 1000ML POUR BTL (IV SOLUTION) ×4 IMPLANT
PACK CHEST (CUSTOM PROCEDURE TRAY) ×2 IMPLANT
PAD ARMBOARD 7.5X6 YLW CONV (MISCELLANEOUS) ×4 IMPLANT
PAD ELECT DEFIB RADIOL ZOLL (MISCELLANEOUS) ×2 IMPLANT
SPONGE GAUZE 4X4 12PLY (GAUZE/BANDAGES/DRESSINGS) ×1 IMPLANT
STRIP CLOSURE SKIN 1/2X4 (GAUZE/BANDAGES/DRESSINGS) ×2 IMPLANT
SUT SILK  1 MH (SUTURE) ×1
SUT SILK 1 MH (SUTURE) IMPLANT
SUT VIC AB 0 CTX 18 (SUTURE) ×2 IMPLANT
SUT VIC AB 1 CTX 18 (SUTURE) ×1 IMPLANT
SUT VIC AB 2-0 CT1 18 (SUTURE) ×1 IMPLANT
SUT VIC AB 2-0 CTX 27 (SUTURE) ×3 IMPLANT
SUT VIC AB 3-0 X1 27 (SUTURE) ×2 IMPLANT
SWAB COLLECTION DEVICE MRSA (MISCELLANEOUS) IMPLANT
SYR 50ML SLIP (SYRINGE) IMPLANT
SYRINGE 10CC LL (SYRINGE) IMPLANT
SYSTEM SAHARA CHEST DRAIN ATS (WOUND CARE) ×2 IMPLANT
TAPE CLOTH SURG 4X10 WHT LF (GAUZE/BANDAGES/DRESSINGS) ×1 IMPLANT
TOWEL OR 17X24 6PK STRL BLUE (TOWEL DISPOSABLE) ×2 IMPLANT
TOWEL OR 17X26 10 PK STRL BLUE (TOWEL DISPOSABLE) ×2 IMPLANT
TRAP SPECIMEN MUCOUS 40CC (MISCELLANEOUS) ×6 IMPLANT
TRAY FOLEY CATH 14FRSI W/METER (CATHETERS) ×2 IMPLANT
TUBE ANAEROBIC SPECIMEN COL (MISCELLANEOUS) IMPLANT
TUBE CONNECTING 12X1/4 (SUCTIONS) ×1 IMPLANT
WATER STERILE IRR 1000ML POUR (IV SOLUTION) ×4 IMPLANT

## 2012-11-13 NOTE — Brief Op Note (Signed)
11/10/2012 - 11/13/2012  12:14 PM  PATIENT:  Kris Mouton Pippenger  67 y.o. male  PRE-OPERATIVE DIAGNOSIS:  pericardial effusion  POST-OPERATIVE DIAGNOSIS:  pericardial effusion  PROCEDURE:  Procedure(s): SUBXYPHOID PERICARDIAL WINDOW (N/A)  SURGEON:  Surgeon(s) and Role:    * Delight Ovens, MD - Primary  PHYSICIAN ASSISTANT: Erin Barrett PA-C   ANESTHESIA:   general  EBL:  Total I/O In: -  Out: 1750 [Urine:1750]  BLOOD ADMINISTERED:none  DRAINS: 28 Bard drain in Pericardial Space   LOCAL MEDICATIONS USED:  NONE  SPECIMEN:  Source of Specimen:  Pericardial Fluid, Pericardial Space  DISPOSITION OF SPECIMEN:  Pathology, Cytology  COUNTS:  YES  TOURNIQUET:  * No tourniquets in log *  DICTATION: .Dragon Dictation  PLAN OF CARE: Admit to inpatient   PATIENT DISPOSITION:  PACU - hemodynamically stable.   Delay start of Pharmacological VTE agent (>24hrs) due to surgical blood loss or risk of bleeding: yes

## 2012-11-13 NOTE — Anesthesia Preprocedure Evaluation (Addendum)
Anesthesia Evaluation  Patient identified by MRN, date of birth, ID band Patient awake    Reviewed: Allergy & Precautions, H&P , NPO status , Patient's Chart, lab work & pertinent test results, reviewed documented beta blocker date and time   Airway Mallampati: II      Dental  (+) Dental Advisory Given, Missing and Poor Dentition   Pulmonary    + decreased breath sounds      Cardiovascular hypertension, Pt. on home beta blockers Rhythm:Regular Rate:Normal  Large effusion   Neuro/Psych dementia CVA, Residual Symptoms    GI/Hepatic GERD-  Controlled,  Endo/Other    Renal/GU Renal disease     Musculoskeletal   Abdominal   Peds  Hematology   Anesthesia Other Findings   Reproductive/Obstetrics                         Anesthesia Physical Anesthesia Plan  ASA: III  Anesthesia Plan: General   Post-op Pain Management:    Induction: Intravenous  Airway Management Planned: Oral ETT  Additional Equipment: Arterial line  Intra-op Plan:   Post-operative Plan: Extubation in OR  Informed Consent:   Plan Discussed with: Anesthesiologist and Surgeon  Anesthesia Plan Comments:         Anesthesia Quick Evaluation

## 2012-11-13 NOTE — Evaluation (Signed)
Physical Therapy Evaluation Patient Details Name: [name] MRN: [id number] DOB: [birth year] Today's Date: 11/13/2012 Time: 1191-4782 PT Time Calculation (min): 12 min  PT Assessment / Plan / Recommendation Clinical Impression  Pt admitted from [facility] with upper GIB and pericardial effusion. Pt very pleasant and very excited to get up and keeps stating "I walk" contacted facility for confirmation of PLOF. Will follow acutely to return pt to PLOF of short distance ambulation and transfers of 1 person assist. Recommend OOB to chair daily with nursing transferring toward left .     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 Restrictions Weight Bearing Restrictions: No   Pertinent Vitals/Pain No pain Vitals stable      Mobility  Bed Mobility Bed Mobility: Rolling Right;Right Sidelying to Sit Rolling Right: 4: Min assist Right Sidelying to Sit: 4: Min assist;HOB flat;With rails Details for Bed Mobility Assistance: assist to bring RLE to EOB and slight assist and cueing to fully elevate trunk and stabilize in sitting Transfers Transfers: Sit to Stand;Stand Pivot Transfers;Stand to Sit Sit to Stand: 1: +2 Total assist Sit to Stand: Patient Percentage: 50% Stand to Sit: 1: +2 Total assist Stand to Sit: Patient Percentage: 50% Stand Pivot Transfers: 1: +2 Total assist Stand Pivot Transfers: Patient Percentage: 50% Details for Transfer Assistance: Pt cued to uncross his legs prior to standing and able to do so and reaching for chair for transfer. Pt wanted chair placed to  his right despite cueing for moving it to the left but pt didn't want it moved. Pt stood with significant posterior lean and pressing legs into bed to stand with assist for stabilty and to rotate pelvis to chair with decreased control of descent Ambulation/Gait Ambulation/Gait Assistance: Not tested (comment) Wheelchair Mobility Wheelchair Mobility: Yes Wheelchair Assistance: 5: Supervision Distance: pt at baseline propels WC on his own. Pt in recliner today and able to propel recliner with brakes unlocked on his own 8'    Exercises     PT Diagnosis: Difficulty walking;Hemiplegia non-dominant side  PT Problem List: Decreased activity tolerance PT Treatment Interventions: Gait training;DME instruction;Functional mobility training;Therapeutic activities;Patient/family education   PT Goals Acute Rehab PT Goals PT Goal Formulation: Patient unable to participate in goal setting Time For Goal Achievement: 11/27/12 Potential to Achieve Goals: Fair Pt will go Supine/Side to Sit: with min assist;with HOB 0 degrees PT Goal: Supine/Side to Sit - Progress: Goal set today Pt will go Sit to Stand: with mod assist PT Goal: Sit to Stand - Progress: Goal set today Pt will go Stand to Sit: with mod assist PT Goal: Stand to Sit - Progress: Goal set today Pt will Transfer Bed to Chair/Chair to Bed: with mod assist PT Transfer Goal: Bed to Chair/Chair to Bed - Progress: Goal set today Pt will Ambulate: 1 - 15 feet;with mod assist;with least restrictive assistive device PT Goal: Ambulate - Progress: Goal set today  Visit Information  Last PT Received On: 11/13/12 Assistance Needed: +2    Subjective Data  Subjective: "I walk every day"  Prior Functioning  Home Living Lives With: Other (Comment) Type of Home: Skilled Nursing Facility Home Access: Level entry Prior Function Level of Independence: Needs assistance Needs Assistance: Bathing;Dressing;Meal Prep;Light  Housekeeping;Transfers;Feeding;Gait Bath: Total Dressing: Total Feeding: Supervision/set-up Meal Prep: Total Light Housekeeping: Total Gait Assistance: mod assist grossly 15' per staff Transfer Assistance: +1 assist to transfer to chair Able to Take Stairs?: No Driving: No Vocation: Unemployed Communication Communication: Expressive difficulties    Cognition  Cognition Overall Cognitive Status: Difficult to assess Difficult to assess due to: Other (comment) (expressive aphasia) Arousal/Alertness: Awake/alert Orientation Level: Disoriented to;Place Behavior During Session: WFL for tasks performed    Extremity/Trunk Assessment Right Upper Extremity Assessment RUE ROM/Strength/Tone: Deficits RUE ROM/Strength/Tone Deficits: flexion contracture  Left Upper Extremity Assessment LUE ROM/Strength/Tone: WFL for tasks assessed;Unable to fully assess;Due to impaired cognition Right Lower Extremity Assessment RLE ROM/Strength/Tone: Deficits RLE ROM/Strength/Tone Deficits: pt with hemiparesis at baseline grossly 2/5 with tendency toward scissoring Left Lower Extremity Assessment LLE ROM/Strength/Tone: [facility] for tasks assessed;Unable to fully assess;Due to impaired cognition   Balance Static Sitting Balance Static Sitting - Balance Support: Feet supported;No upper extremity supported Static Sitting - Level of Assistance: 5: Stand by assistance Static Sitting - Comment/# of Minutes: 3  End of Session PT - End of Session Equipment Utilized During Treatment: Gait belt Activity Tolerance: Patient tolerated treatment well Patient left: in chair;with call bell/phone within reach Nurse Communication: Mobility status  GP     [name] 11/13/2012, 9:26 AM  [name], PT [phone]

## 2012-11-13 NOTE — Preoperative (Signed)
Beta Blockers   Reason not to administer Beta Blockers:Labetalol given 0003

## 2012-11-13 NOTE — Transfer of Care (Signed)
Immediate Anesthesia Transfer of Care Note  Patient: [name]  Procedure(s) Performed: Procedure(s): SUBXYPHOID PERICARDIAL WINDOW (N/A)  Patient Location: PACU  Anesthesia Type:General  Level of Consciousness: awake and patient cooperative  Airway & Oxygen Therapy: Patient Spontanous Breathing and Patient connected to nasal cannula oxygen  Post-op Assessment: Report given to PACU RN and Post -op Vital signs reviewed and stable  Post vital signs: Reviewed and stable  Complications: No apparent anesthesia complications

## 2012-11-13 NOTE — Anesthesia Postprocedure Evaluation (Signed)
  Anesthesia Post-op Note  Patient: [name]  Procedure(s) Performed: Procedure(s): SUBXYPHOID PERICARDIAL WINDOW (N/A)  Patient Location: PACU  Anesthesia Type:General  Level of Consciousness: awake  Airway and Oxygen Therapy: Patient Spontanous Breathing  Post-op Pain: mild  Post-op Assessment: Post-op Vital signs reviewed  Post-op Vital Signs: stable  Complications: No apparent anesthesia complications

## 2012-11-13 NOTE — Progress Notes (Signed)
Subjective:  No complaints this morning, resting comfortably in bed. Denies any shortness of breath, no chest pain. Difficult communication with expressive aphasia.  Objective:  Vital Signs in the last 24 hours: Temp:  [97.4 F (36.3 C)-98.7 F (37.1 C)] 98.2 F (36.8 C) (02/28 0743) Pulse Rate:  [77-99] 94 (02/28 0743) Resp:  [14-22] 21 (02/28 0743) BP: (106-152)/(58-77) 146/77 mmHg (02/28 0743) SpO2:  [98 %-100 %] 99 % (02/28 0743) Weight:  [93.5 kg (206 lb 2.1 oz)] 93.5 kg (206 lb 2.1 oz) (02/28 0459)  Intake/Output from previous day: 02/27 0701 - 02/28 0700 In: 360 [P.O.:360] Out: 150 [Urine:150]   Physical Exam: General: Well developed, well nourished, in no acute distress.  Head:  Normocephalic and atraumatic. Lungs: Clear to auscultation and percussion. Heart: Normal S1 and S2.  No murmur, rubs or gallops. No appreciable JVD. Abdomen: soft, non-tender, positive bowel sounds. Extremities: No clubbing or cyanosis. No edema. Neurologic: Alert right-sided hemiparesis noted, expressive aphasia.   Lab Results:  Recent Labs  11/11/12 0845  11/12/12 1259 11/13/12 0430  WBC 8.4  --   --  6.7  HGB 12.3*  < > 10.5* 10.2*  PLT 213  --   --  187  < > = values in this interval not displayed.  Recent Labs  11/12/12 0121 11/13/12 0430  NA 140 140  K 4.1 4.1  CL 112 111  CO2 21 21  GLUCOSE 139* 121*  BUN 23 22  CREATININE 1.53* 1.62*    Recent Labs  11/10/12 2342  TROPONINI <0.30   Hepatic Function Panel  Recent Labs  11/11/12 1703  PROT 6.5  ALBUMIN 3.1*  AST 17  ALT 10  ALKPHOS 46  BILITOT 0.2*  BILIDIR <0.1  IBILI NOT CALCULATED   Imaging: Ct Abdomen Pelvis Wo Contrast  11/11/2012  *RADIOLOGY REPORT*  Clinical Data: Distended abdomen with abdominal pain.  CT ABDOMEN AND PELVIS WITHOUT CONTRAST  Technique:  Multidetector CT imaging of the abdomen and pelvis was performed following the standard protocol without intravenous contrast.  Comparison:  12/10/2009  Findings: Small bilateral pleural effusions with atelectasis or infiltration in both lung bases.  Moderate sized pericardial effusion.  Cardiac enlargement.  Moderate sized esophageal hiatal hernia.  The unenhanced appearance of the liver, spleen, gallbladder, pancreas, adrenal glands, and retroperitoneal lymph nodes is unremarkable.  Mild calcification of the abdominal aorta without aneurysm.  Multiple parenchymal cysts in the right kidney corresponding to the previous study.  Largest is in the upper pole and measures 1.7 cm.  There is a hyper dense lesion in the upper pole of the right kidney posteriorly measuring 7 mm consistent with a hemorrhagic cyst.  No hydronephrosis of the right kidney.  The left kidney demonstrates marked hydronephrosis and ureterectasis with dilated and tortuous aorta down to the level of the bladder. There is marked parenchymal atrophy in the left kidney suggesting that this represents a longstanding process.  The appearance is similar to the previous study, allowing for technical differences. The stomach, small bowel, and colon are not abnormally distended. No abdominal ascites.  Pelvis:  There is marked distension of the bladder suggesting possible bladder outlet obstruction versus dysmotility.  The prostate gland is not appear to be enlarged.  The appearance is stable since the previous study.  No free or loculated pelvic fluid collections.  The rectum is decompressed but there is apparent thickening of the wall of the anorectal junction.  Was present previously.  This could represent chronic inflammatory process.  Mass lesion can also have this appearance.  The appendix is normal. No diverticulitis.  Lumbar scoliosis convex towards the right.  Degenerative changes in the lumbar spine and hips.  IMPRESSION: Small bilateral pleural effusions with basilar atelectasis or infiltration.  Cardiac enlargement with pericardial effusion. Prominent pyelocaliectasis and ureterectasis  of the left kidney with parenchymal atrophy.  Distended bladder without wall thickening.  Thickening of the wall of the anorectal junction. These findings are similar to previous study.  Clinical appearance of abdominal distension is likely to be due to the distended bladder.   Original Report Authenticated By: Burman Nieves, M.D.    Ct Head Wo Contrast  11/12/2012  *RADIOLOGY REPORT*  Clinical Data: 67 year old male with right hemipareses.  History of dementia, prior infarct  CT HEAD WITHOUT CONTRAST  Technique:  Contiguous axial images were obtained from the base of the skull through the vertex without contrast.  Comparison: None  Findings: Left hemispheric encephalomalacia noted. Chronic small vessel white matter ischemic changes are present. A 1.7 cm calcified area in the left periventricular region is likely chronic. No acute intracranial abnormalities are identified, including mass effect, hydrocephalus, extra-axial fluid collection, midline shift, hemorrhage, or acute infarction.  Left frontotemporal craniectomy noted.  IMPRESSION: No evidence of acute intracranial abnormality.  Left hemispheric encephalomalacia and chronic small vessel white matter ischemic changes.   Original Report Authenticated By: Harmon Pier, M.D.    Personally viewed.   Telemetry: Left bundle branch block, sinus rhythm Personally viewed.    Cardiac Studies:  EF 40%, large pericardial effusion, no RV collapse, possible mild inhibition of right atrial free wall.  Assessment/Plan:  Principal Problem:   Pericardial effusion Active Problems:   Dementia   Stroke   GERD (gastroesophageal reflux disease)   Hematemesis/vomiting blood   UGIB (upper gastrointestinal bleed)   Hemiparesis affecting right side as late effect of stroke   Expressive aphasia   Hypertension, accelerated  67 year old with prior stroke, admitted with nausea vomiting, hematemesis, hemoglobin now stable, no further bleeding, EGD unremarkable with  incidental finding of large pericardial effusion on abdominal CT scan with subsequent echocardiogram demonstrating large circumferential pericardial effusion, hemodynamically stable, Cardiomyopathy, EF 35-40%, hypertension, left bundle branch block.   - Appreciate TCTS consultation for pericardial window. Cytology, tissue. Question underlying etiology. Sed rate 22, not very elevated, TSH 1.0-normal, rheumatoid factor less than 10. No recent cough, unexplained weight loss. Chest x-ray with cardiomegaly and no evidence of acute cardiopulmonary disease. Currently hemodynamically stable. N.p.o.  - At some point, we will transition him to carvedilol. I will not add an ACE inhibitor at this point do to creatinine. He may benefit from hydralazine/isosorbide. No diuretic at this point. We will continue to monitor.   Jacari Iannello 11/13/2012, 8:02 AM

## 2012-11-13 NOTE — Anesthesia Procedure Notes (Signed)
Procedure Name: Intubation Date/Time: 11/13/2012 11:12 AM Performed by: Sherie Don Pre-anesthesia Checklist: Patient identified, Emergency Drugs available, Suction available, Patient being monitored and Timeout performed Patient Re-evaluated:Patient Re-evaluated prior to inductionOxygen Delivery Method: Circle system utilized Preoxygenation: Pre-oxygenation with 100% oxygen Intubation Type: IV induction Ventilation: Mask ventilation without difficulty and Oral airway inserted - appropriate to patient size Laryngoscope Size: Mac and 3 Grade View: Grade II Tube type: Oral Tube size: 8.0 mm Number of attempts: 1 Airway Equipment and Method: Stylet Placement Confirmation: ETT inserted through vocal cords under direct vision,  positive ETCO2 and breath sounds checked- equal and bilateral Secured at: 23 cm Tube secured with: Tape Dental Injury: Teeth and Oropharynx as per pre-operative assessment

## 2012-11-13 NOTE — Consult Note (Signed)
[address]          [place] [address]       [id number]       [name] [facility] Medical Record #[id number] Date of Birth: Apr 14, 1946  Referring: Dr Anne Fu Primary Care: Dr Isidoro Donning  Chief Complaint:    Chief Complaint  Patient presents with  . Hematemesis  . Chest Pain    History of Present Illness:        Current Activity/ Functional Status: Patient is not independent with mobility/ambulation, transfers, ADL's, IADL's.   Zubrod Score: At the time of surgery this patient's most appropriate activity status/level should be described as: []  Normal activity, no symptoms []  Symptoms, fully ambulatory []  Symptoms, in bed less than or equal to 50% of the time []  Symptoms, in bed greater than 50% of the time but less than 100% []  Bedridden []  Moribund  Past Medical History  Diagnosis Date  . Dementia   . Stroke   . Hydronephrosis   . Hypertension   . Hyperlipemia   . GERD (gastroesophageal reflux disease)   . BPH (benign prostatic hyperplasia)     Past Surgical History  Procedure Laterality Date  . Esophagogastroduodenoscopy N/A 11/11/2012    Procedure: ESOPHAGOGASTRODUODENOSCOPY (EGD);  Surgeon: [name], MD;  Location: [facility] ENDOSCOPY;  Service: Endoscopy;  Laterality: N/A;    History  Smoking status  . Never Smoker   Smokeless tobacco  . Never Used    History  Alcohol Use No    History   Social History  . Marital Status: Single    Spouse Name: N/A    Number of Children: N/A  . Years of Education: N/A   Occupational History  . Not on file.   Social History Main Topics  . Smoking status: Never Smoker   . Smokeless tobacco: Never Used  . Alcohol Use: No  . Drug Use: No  . Sexually Active: Not on file   Other Topics Concern  . Not on file   Social History Narrative  . No narrative on file    No Known Allergies  Current Facility-Administered Medications  Medication Dose Route Frequency  Provider Last Rate Last Dose  . citalopram (CELEXA) tablet 5 mg  5 mg Oral Daily Ripudeep Jenna Luo, MD   5 mg at 11/12/12 0948  . cloNIDine (CATAPRES) tablet 0.1 mg  0.1 mg Oral BID Ripudeep K Rai, MD   0.1 mg at 11/13/12 0002  . docusate sodium (COLACE) capsule 100 mg  100 mg Oral BID Ripudeep Jenna Luo, MD   100 mg at 11/13/12 0002  . hydrALAZINE (APRESOLINE) tablet 50 mg  50 mg Oral TID Ripudeep Jenna Luo, MD   50 mg at 11/13/12 0002  . labetalol (NORMODYNE) tablet 300 mg  300 mg Oral BID Ripudeep Jenna Luo, MD   300 mg at 11/13/12 0003  . labetalol (NORMODYNE,TRANDATE) injection 10 mg  10 mg Intravenous Q4H PRN Ripudeep K Rai, MD      . lactulose (CHRONULAC) 10 GM/15ML solution 20 g  20 g Oral BID Ripudeep K Rai, MD   20 g at 11/13/12 0003  . memantine (NAMENDA) tablet 10 mg  10 mg Oral BID Ripudeep Jenna Luo, MD   10 mg at 11/13/12 0002  . ondansetron (ZOFRAN) tablet 4 mg  4 mg Oral Q6H PRN Tarry Kos, MD  Or  . ondansetron (ZOFRAN) injection 4 mg  4 mg Intravenous Q6H PRN Tarry Kos, MD      . pantoprazole (PROTONIX) injection 40 mg  40 mg Intravenous Q12H [name], MD   40 mg at 11/13/12 0002  . simvastatin (ZOCOR) tablet 40 mg  40 mg Oral QPM Ripudeep K Rai, MD   40 mg at 11/12/12 1832  . tamsulosin (FLOMAX) capsule 0.4 mg  0.4 mg Oral Daily Ripudeep Jenna Luo, MD   0.4 mg at 11/12/12 1914    Prescriptions prior to admission  Medication Sig Dispense Refill  . amLODipine (NORVASC) 10 MG tablet Take 10 mg by mouth daily.      . bisacodyl (BISACODYL) 5 MG EC tablet Take 15 mg by mouth at bedtime.      . citalopram (CELEXA) 10 MG tablet Take 5 mg by mouth daily.      . cloNIDine (CATAPRES) 0.3 MG tablet Take 0.3 mg by mouth 2 (two) times daily.      Marland Kitchen docusate sodium (COLACE) 100 MG capsule Take 100 mg by mouth 2 (two) times daily.      . hydrALAZINE (APRESOLINE) 50 MG tablet Take 50 mg by mouth 3 (three) times daily.      Marland Kitchen labetalol (NORMODYNE) 300 MG tablet Take 300 mg by mouth 2 (two)  times daily.      Marland Kitchen lactulose (CHRONULAC) 10 GM/15ML solution Take 30 mLs by mouth 2 (two) times daily.      . memantine (NAMENDA) 10 MG tablet Take 10 mg by mouth 2 (two) times daily.      Marland Kitchen omeprazole (PRILOSEC) 20 MG capsule Take 20 mg by mouth daily.      . simvastatin (ZOCOR) 40 MG tablet Take 40 mg by mouth every evening.      . tamsulosin (FLOMAX) 0.4 MG CAPS Take 0.4 mg by mouth daily.        History reviewed. No pertinent family history.   Review of Systems:     Cardiac Review of Systems: Y or N  Chest Pain [    ]  Resting SOB [   ] Exertional SOB  [  ]  Orthopnea [  ]   Pedal Edema [   ]    Palpitations [  ] Syncope  [  ]   Presyncope [   ]  General Review of Systems: [Y] = yes [  ]=no Constitional: recent weight change [  ]; anorexia [  ]; fatigue [  ]; nausea [  ]; night sweats [  ]; fever [  ]; or chills [  ]                                                               Dental: poor dentition[  ]; Last Dentist visit:   Eye : blurred vision [  ]; diplopia [   ]; vision changes [  ];  Amaurosis fugax[  ]; Resp: cough [  ];  wheezing[  ];  hemoptysis[  ]; shortness of breath[  ]; paroxysmal nocturnal dyspnea[  ]; dyspnea on exertion[  ]; or orthopnea[  ];  GI:  gallstones[  ], vomiting[  ];  dysphagia[  ]; melena[  ];  hematochezia [  ]; heartburn[  ];  Hx of  Colonoscopy[  ]; GU: kidney stones [  ]; hematuria[  ];   dysuria [  ];  nocturia[  ];  history of     obstruction [  ]; urinary frequency [  ]             Skin: rash, swelling[  ];, hair loss[  ];  peripheral edema[  ];  or itching[  ]; Musculosketetal: myalgias[  ];  joint swelling[  ];  joint erythema[  ];  joint pain[  ];  back pain[  ];  Heme/Lymph: bruising[  ];  bleeding[  ];  anemia[  ];  Neuro: TIA[  ];  headaches[  ];  stroke[  ];  vertigo[  ];  seizures[  ];   paresthesias[  ];  difficulty walking[  ];  Psych:depression[  ]; anxiety[  ];  Endocrine: diabetes[  ];  thyroid dysfunction[  ];  Immunizations: Flu  [  ]; Pneumococcal[  ];  Other:  Physical Exam: BP 146/77  Pulse 94  Temp(Src) 98.2 F (36.8 C) (Oral)  Resp 21  Wt 206 lb 2.1 oz (93.5 kg)  SpO2 98%  General appearance: alert, cooperative, slowed mentation and difficulity with speach, but appears to understand Neurologic: intact and rt side sever weakness Heart: regular rate and rhythm, S1, S2 normal, no murmur, click, rub or gallop Lungs: diminished breath sounds bibasilar Abdomen: soft, non-tender; bowel sounds normal; no masses,  no organomegaly Extremities: rt arm and leak weak    Diagnostic Studies & Laboratory data:     Recent Radiology Findings:   Ct Abdomen Pelvis Wo Contrast  11/11/2012  *RADIOLOGY REPORT*  Clinical Data: Distended abdomen with abdominal pain.  CT ABDOMEN AND PELVIS WITHOUT CONTRAST  Technique:  Multidetector CT imaging of the abdomen and pelvis was performed following the standard protocol without intravenous contrast.  Comparison: 12/10/2009  Findings: Small bilateral pleural effusions with atelectasis or infiltration in both lung bases.  Moderate sized pericardial effusion.  Cardiac enlargement.  Moderate sized esophageal hiatal hernia.  The unenhanced appearance of the liver, spleen, gallbladder, pancreas, adrenal glands, and retroperitoneal lymph nodes is unremarkable.  Mild calcification of the abdominal aorta without aneurysm.  Multiple parenchymal cysts in the right kidney corresponding to the previous study.  Largest is in the upper pole and measures 1.7 cm.  There is a hyper dense lesion in the upper pole of the right kidney posteriorly measuring 7 mm consistent with a hemorrhagic cyst.  No hydronephrosis of the right kidney.  The left kidney demonstrates marked hydronephrosis and ureterectasis with dilated and tortuous aorta down to the level of the bladder. There is marked parenchymal atrophy in the left kidney suggesting that this represents a longstanding process.  The appearance is similar to the  previous study, allowing for technical differences. The stomach, small bowel, and colon are not abnormally distended. No abdominal ascites.  Pelvis:  There is marked distension of the bladder suggesting possible bladder outlet obstruction versus dysmotility.  The prostate gland is not appear to be enlarged.  The appearance is stable since the previous study.  No free or loculated pelvic fluid collections.  The rectum is decompressed but there is apparent thickening of the wall of the anorectal junction.  Was present previously.  This could represent chronic inflammatory process. Mass lesion can also have this appearance.  The appendix is normal. No diverticulitis.  Lumbar scoliosis convex towards the right.  Degenerative changes in the lumbar spine and hips.  IMPRESSION: Small  bilateral pleural effusions with basilar atelectasis or infiltration.  Cardiac enlargement with pericardial effusion. Prominent pyelocaliectasis and ureterectasis of the left kidney with parenchymal atrophy.  Distended bladder without wall thickening.  Thickening of the wall of the anorectal junction. These findings are similar to previous study.  Clinical appearance of abdominal distension is likely to be due to the distended bladder.   Original Report Authenticated By: Burman Nieves, M.D.    Ct Head Wo Contrast  11/12/2012  *RADIOLOGY REPORT*  Clinical Data: 67 year old male with right hemipareses.  History of dementia, prior infarct  CT HEAD WITHOUT CONTRAST  Technique:  Contiguous axial images were obtained from the base of the skull through the vertex without contrast.  Comparison: None  Findings: Left hemispheric encephalomalacia noted. Chronic small vessel white matter ischemic changes are present. A 1.7 cm calcified area in the left periventricular region is likely chronic. No acute intracranial abnormalities are identified, including mass effect, hydrocephalus, extra-axial fluid collection, midline shift, hemorrhage, or acute  infarction.  Left frontotemporal craniectomy noted.  IMPRESSION: No evidence of acute intracranial abnormality.  Left hemispheric encephalomalacia and chronic small vessel white matter ischemic changes.   Original Report Authenticated By: Harmon Pier, M.D.       Recent Lab Findings: Lab Results  Component Value Date   WBC 6.7 11/13/2012   HGB 10.2* 11/13/2012   HCT 31.4* 11/13/2012   PLT 187 11/13/2012   GLUCOSE 121* 11/13/2012   ALT 10 11/11/2012   AST 17 11/11/2012   NA 140 11/13/2012   K 4.1 11/13/2012   CL 111 11/13/2012   CREATININE 1.62* 11/13/2012   BUN 22 11/13/2012   CO2 21 11/13/2012   TSH 1.022 11/12/2012   INR 1.01 11/10/2012      Assessment / Plan:   Larger pericardial effusion, with mild ra collapse, recommend drainage for therapeutic and dx reasons   Discussed with patient and his bother on the phone The goals risks and alternatives of the planned surgical procedure pericardial drainage  have been discussed with the patient in detail. The risks of the procedure including death, infection, stroke, myocardial infarction, bleeding, blood transfusion have all been discussed specifically.  I have quoted [name] a 5% of perioperative mortality and a complication rate as high as 15 %. The patient's questions have been answered.EL PILE is willing  to proceed with the planned procedure.      Delight Ovens MD  Beeper [phone] Office [phone] 11/13/2012 10:03 AM

## 2012-11-13 NOTE — Progress Notes (Signed)
Patient ID: PEARLY APACHITO  male  ZOX:[id number]    DOB: [birth year]    DOA: 11/10/2012  PCP: No primary provider on file.  Assessment/Plan: Principal Problem:  Pericardial effusion: - ECHO done yesterday showed large free flowing pericardial effusion, no tamponade physiology, EF 35-40%, Possible hypokinesis of the mid-distalinferoseptal myocardium. D/w Dr Anne Fu last night, highly appreciate recommendations and assistance. NPO for CT surgery eval for possible window, biopsy today.     Active problems   Hematemesis/vomiting blood, upper GI bleed: resolved - EGD done showed hiatal hernia, minimal antral gastritis, no bleeding source - CT abdomen and pelvis was done which showed small bilateral pleural effusions, cardiac enlargement with pericardial effusion - npo for now, IV PPI - swallow eval done-> regular diet, tolerated last night    Hypertension, accelerated - now improved, cont current management. I had DC'ed amlodipine and dec clonidine due to hypotensive episodes, will titrate if needed.     Dementia: cont namenda    GERD (gastroesophageal reflux disease): on PPI, change to oral when able to eat    Hemiparesis affecting right side as late effect of stroke,  Expressive aphasia: Appears to be from the prior stroke - CT head did not show any acute CVA, PT eval pending   DVT Prophylaxis: SCDs  Code Status:  Disposition:   Subjective: no active bleeding, hemoglobin stable. NPO for CT surgery eval   Objective: Weight change: 0 kg (0 lb)  Intake/Output Summary (Last 24 hours) at 11/13/12 0758 Last data filed at 11/13/12 0400  Gross per 24 hour  Intake    360 ml  Output    150 ml  Net    210 ml   Blood pressure 146/77, pulse 94, temperature 98.2 F (36.8 C), temperature source Oral, resp. rate 21, weight 93.5 kg (206 lb 2.1 oz), SpO2 99.00%.  Physical Exam: General: Alert and awake, oriented dysarthria CVS: S1-S2 clear Chest: CTAB Abdomen: soft NT, NBS, ND   Extremities: no c/c/e bilaterally Neuro: right-sided weakness (from prior stroke), dysarthria   Lab Results: Basic Metabolic Panel:  Recent Labs Lab 11/12/12 0121 11/13/12 0430  NA 140 140  K 4.1 4.1  CL 112 111  CO2 21 21  GLUCOSE 139* 121*  BUN 23 22  CREATININE 1.53* 1.62*  CALCIUM 8.2* 8.3*   Liver Function Tests:  Recent Labs Lab 11/11/12 1703  AST 17  ALT 10  ALKPHOS 46  BILITOT 0.2*  PROT 6.5  ALBUMIN 3.1*   CBC:  Recent Labs Lab 11/11/12 0845  11/12/12 1259 11/13/12 0430  WBC 8.4  --   --  6.7  HGB 12.3*  < > 10.5* 10.2*  HCT 37.5*  < > 31.8* 31.4*  MCV 80.6  --   --  81.1  PLT 213  --   --  187  < > = values in this interval not displayed. Cardiac Enzymes:  Recent Labs Lab 11/10/12 2342  TROPONINI <0.30     Micro Results: Recent Results (from the past 240 hour(s))  MRSA PCR SCREENING     Status: None   Collection Time    11/11/12  1:24 PM      Result Value Range Status   MRSA by PCR NEGATIVE  NEGATIVE Final   Comment:            The GeneXpert MRSA Assay (FDA     approved for NASAL specimens     only), is one component of a     comprehensive MRSA  colonization     surveillance program. It is not     intended to diagnose MRSA     infection nor to guide or     monitor treatment for     MRSA infections.    Studies/Results: Dg Chest 2 View  11/11/2012  *RADIOLOGY REPORT*  Clinical Data: Chest pain.  CHEST - 2 VIEW  Comparison: 10/23/2007 and prior chest radiographs  Findings: Cardiomegaly and peribronchial thickening again noted. Left basilar scarring/density is unchanged. There is no evidence of pleural effusion, pneumothorax or pulmonary edema. No acute bony abnormalities are identified.  IMPRESSION: Cardiomegaly without evidence of acute cardiopulmonary disease.   Original Report Authenticated By: Harmon Pier, M.D.    Dg Abd 2 Views  11/11/2012  *RADIOLOGY REPORT*  Clinical Data: Abdominal pain and vomiting.  ABDOMEN - 2 VIEW   Comparison: 01/15/2011 radiographs  Findings: Gas filled loops of colon and small bowel identified. No dilated loops of bowel are present. There is no evidence of pneumoperitoneum. No suspicious calcifications are noted. A severe thoracolumbar scoliosis is again identified.  IMPRESSION: Nonspecific nonobstructive bowel gas pattern - no evidence of pneumoperitoneum.   Original Report Authenticated By: Harmon Pier, M.D.     Medications: Scheduled Meds: . citalopram  5 mg Oral Daily  . cloNIDine  0.1 mg Oral BID  . docusate sodium  100 mg Oral BID  . hydrALAZINE  50 mg Oral TID  . labetalol  300 mg Oral BID  . lactulose  20 g Oral BID  . memantine  10 mg Oral BID  . pantoprazole (PROTONIX) IV  40 mg Intravenous Q12H  . simvastatin  40 mg Oral QPM  . tamsulosin  0.4 mg Oral Daily      LOS: 3 days   Antoney Biven M.D. [facility] 11/13/2012, 7:58 AM Pager: [phone]  If 7PM-7AM, please contact night-coverage [email or web address] Password [email or web address]

## 2012-11-13 NOTE — Progress Notes (Signed)
TCTS BRIEF SICU PROGRESS NOTE  Day of Surgery  S/P Procedure(s) (LRB): SUBXYPHOID PERICARDIAL WINDOW (N/A)   Stable postop NSR w/ stable BP O2 sats 98% on 2 L/min Chest tube output low  Plan: Per medical team.  Will leave tube in at least 24-48 hours.  [name] H 11/13/2012 9:50 PM

## 2012-11-13 NOTE — Evaluation (Signed)
Occupational Therapy Evaluation Patient Details Name: DSHAUN REPPUCCI MRN: [id number] DOB: [birth year] Today's Date: 11/13/2012 Time: 5784-6962 OT Time Calculation (min): 18 min  OT Assessment / Plan / Recommendation Clinical Impression  This 67 yo male admitted with GIB and pericardial effusion presents to acute OT with problems below. Will benefit from acute OT.    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 cVA Restrictions Weight Bearing Restrictions: No       ADL  Eating/Feeding: Simulated;Supervision/safety;Set up (could get spoon and cup to mouth without issues) Where Assessed - Eating/Feeding: Chair Grooming: Performed;Wash/dry face;Supervision/safety;Set up Where Assessed - Grooming: Supported sitting Upper Body Bathing: Simulated;Minimal assistance Where Assessed - Upper Body Bathing: Supported sitting Lower Body Bathing: Simulated;Maximal assistance Where Assessed - Lower Body Bathing: Supported sit to stand (with additional +1 for to maintain standing) Upper Body Dressing: +1 Total assistance;Simulated Where Assessed - Upper Body Dressing: Supported sitting Lower Body Dressing: +1 Total assistance;Simulated (with additional +1 to maintain standing) Where Assessed - Lower Body Dressing: Supported sit to Pharmacist, hospital: Chief of Staff: Patient Percentage: 30% Statistician Method: Surveyor, minerals:  (recliner to bed going to pt's left) Toileting - Clothing Manipulation and Hygiene: Simulated;+1 Total assistance (with additional +1 to maintain standing) Where Assessed - Toileting Clothing Manipulation and Hygiene: Standing Equipment Used:  Gait belt Transfers/Ambulation Related to ADLs: total A+2 (pt= 30%) for all    OT Diagnosis: Hemiplegia dominant side  OT Problem List: Impaired balance (sitting and/or standing);Impaired UE functional use;Impaired tone OT Treatment Interventions: Self-care/ADL training;Patient/family education;Balance training   OT Goals Acute Rehab OT Goals OT Goal Formulation: With patient Time For Goal Achievement: 11/27/12 Potential to Achieve Goals: Good ADL Goals Pt Will Transfer to Toilet: with mod assist;Stand pivot transfer;Regular height toilet;Comfort height toilet;Grab bars ADL Goal: Toilet Transfer - Progress: Goal set today Miscellaneous OT Goals Miscellaneous OT Goal #1: Pt will be able to get up to EOB and back to supine with S (HOB flat and use of rail) for BADLs. OT Goal: Miscellaneous Goal #1 - Progress: Goal set today Miscellaneous OT Goal #2: Pt will be able to maintain standing with Mod A for peri care OT Goal: Miscellaneous Goal #2 - Progress: Goal set today  Visit Information  Last OT Received On: 11/13/12 Assistance Needed: +2    Subjective Data  Subjective: I walk every day   Prior Functioning     Home Living Lives With: Other (Comment) Available Help at Discharge: Skilled Nursing Facility Type of Home: Skilled Nursing Facility Home Access: Level entry Prior Function Level of Independence: Needs assistance Needs Assistance: Bathing;Dressing;Meal Prep;Light Housekeeping;Transfers;Feeding;Gait;Toileting Bath: Total Dressing: Total Feeding: Supervision/set-up Toileting: Moderate Meal Prep: Total Light Housekeeping: Total Gait Assistance: mod assist grossly 15' per staff Transfer Assistance: +1 assist to transfer to chair Able to Take Stairs?: No Driving: No Vocation: Unemployed Communication Communication: Expressive difficulties Dominant Hand: Left (ws right handed prior to stroke)            Cognition  Cognition Overall Cognitive Status:  Impaired Area of Impairment: Problem solving Difficult to assess due to: Impaired communication Arousal/Alertness: Awake/alert Orientation Level: Disoriented to;Situation;Place Behavior During Session: [facility] for tasks performed  Problem Solving: of how to scoot to edge of chair    Extremity/Trunk Assessment Right Upper Extremity Assessment RUE ROM/Strength/Tone: Deficits RUE ROM/Strength/Tone Deficits: flexion 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 No AROM at all Left Upper Extremity Assessment LUE ROM/Strength/Tone: [facility] for tasks assessed Right Lower Extremity Assessment RLE ROM/Strength/Tone: Deficits RLE ROM/Strength/Tone Deficits: pt with hemiparesis at baseline grossly 2/5 with tendency toward scissoring Left Lower Extremity Assessment LLE ROM/Strength/Tone: [facility] for tasks assessed;Unable to fully assess;Due to impaired cognition     Mobility Bed Mobility Bed Mobility: Sit to Supine Rolling Right: 4: Min assist Right Sidelying to Sit: 4: Min assist;HOB flat;With rails Sit to Supine: 4: Min assist;HOB flat Details for Bed Mobility Assistance: assist to bring RLE to EOB and slight assist and cueing to fully elevate trunk and stabilize in sitting Transfers Transfers: Sit to Stand;Stand to Sit Sit to Stand: 1: +2 Total assist Sit to Stand: Patient Percentage: 30% Stand to Sit: 1: +2 Total assist Stand to Sit: Patient Percentage: 30% Details for Transfer Assistance: Pt cued to uncross his legs prior to standing and able to do so and reaching for chair for transfer. Pt wanted chair placed to his right despite cueing for moving it to the left but pt didn't want it moved. Pt stood with significant posterior lean and pressing legs into bed to stand with assist for stabilty and to rotate pelvis to chair with decreased control of descent           End of Session OT - End of Session Equipment Utilized During Treatment: Gait belt Activity Tolerance: Patient tolerated treatment  well Patient left: in bed Nurse Communication:  (Nurse assisted me with getting pt back to bed)       Evette Georges [phone] 11/13/2012, 10:47 AM

## 2012-11-14 ENCOUNTER — Inpatient Hospital Stay (HOSPITAL_COMMUNITY): Payer: Medicare Other

## 2012-11-14 LAB — CBC
Hemoglobin: 11.3 g/dL — ABNORMAL LOW (ref 13.0–17.0)
MCV: 81.7 fL (ref 78.0–100.0)
Platelets: 209 10*3/uL (ref 150–400)
RBC: 4.31 MIL/uL (ref 4.22–5.81)
WBC: 9.4 10*3/uL (ref 4.0–10.5)

## 2012-11-14 LAB — BASIC METABOLIC PANEL
CO2: 21 mEq/L (ref 19–32)
Chloride: 109 mEq/L (ref 96–112)
Creatinine, Ser: 1.51 mg/dL — ABNORMAL HIGH (ref 0.50–1.35)
Glucose, Bld: 118 mg/dL — ABNORMAL HIGH (ref 70–99)
Sodium: 142 mEq/L (ref 135–145)

## 2012-11-14 MED ORDER — PANTOPRAZOLE SODIUM 40 MG PO TBEC
40.0000 mg | DELAYED_RELEASE_TABLET | Freq: Two times a day (BID) | ORAL | Status: DC
  Administered 2012-11-14 (×2): 40 mg via ORAL
  Filled 2012-11-14 (×2): qty 1

## 2012-11-14 MED ORDER — ATORVASTATIN CALCIUM 20 MG PO TABS
20.0000 mg | ORAL_TABLET | Freq: Every day | ORAL | Status: DC
  Filled 2012-11-14 (×2): qty 1

## 2012-11-14 MED ORDER — AMLODIPINE BESYLATE 10 MG PO TABS
10.0000 mg | ORAL_TABLET | Freq: Every day | ORAL | Status: DC
  Administered 2012-11-14: 10 mg via ORAL
  Filled 2012-11-14 (×2): qty 1

## 2012-11-14 MED ORDER — PANTOPRAZOLE SODIUM 40 MG IV SOLR
40.0000 mg | Freq: Two times a day (BID) | INTRAVENOUS | Status: DC
  Administered 2012-11-14 – 2012-11-18 (×9): 40 mg via INTRAVENOUS
  Filled 2012-11-14 (×12): qty 40

## 2012-11-14 NOTE — Progress Notes (Signed)
Patient ID: [name]  male  ZOX:[id number]    DOB: 04/29/1946    DOA: 11/10/2012  PCP: No primary provider on file.  Assessment/Plan: Principal Problem:  Pericardial effusion: s/p pericardial window, pod#1 - ECHO :showed large free flowing pericardial effusion, no tamponade physiology, EF 35-40%, Possible hypokinesis of the mid-distalinferoseptal myocardium. - Appreciate CT surgery assistance, s/p Subxiphoid pericardial window and drainage of pericardial effusion. Fluid cultures pending, will follow. Mgmt per CT surgery.   Active problems   Hematemesis/vomiting blood, upper GI bleed: resolved - EGD done showed hiatal hernia, minimal antral gastritis, no bleeding source - CT abdomen and pelvis was done which showed small bilateral pleural effusions, cardiac enlargement with pericardial effusion - Continue PPI, change to oral, GI signed off - H/H stable    Hypertension, accelerated: BP improved but now tachycardiac, likely sec to pain  - On amlodipine, clonidine, hydralazine and labetalol    Dementia: cont namenda    GERD (gastroesophageal reflux disease): on PPI    Hemiparesis affecting right side as late effect of stroke,  Expressive aphasia: Appears to be from the prior stroke - CT head did not show any acute CVA, PT eval -> will return to SNF when medically clear   DVT Prophylaxis: SCDs  Code Status:  Disposition:   Subjective: C/o pain, has residual dysarthria from previous stroke, diet advanced to solids today     Objective: Weight change: -0.8 kg (-1 lb 12.2 oz)  Intake/Output Summary (Last 24 hours) at 11/14/12 0902 Last data filed at 11/14/12 0700  Gross per 24 hour  Intake   1160 ml  Output   3215 ml  Net  -2055 ml   Blood pressure 157/94, pulse 108, temperature 98.1 F (36.7 C), temperature source Oral, resp. rate 27, weight 92.7 kg (204 lb 5.9 oz), SpO2 99.00%.  Physical Exam: General: Alert and awake, oriented dysarthria CVS: S1-S2 clear Chest:  CTAB, chest tube + Abdomen: soft NT, NBS, ND  Extremities: no c/c/e bilaterally Neuro: right-sided weakness (from prior stroke), dysarthria   Lab Results: Basic Metabolic Panel:  Recent Labs Lab 11/13/12 0430 11/14/12 0501  NA 140 142  K 4.1 4.3  CL 111 109  CO2 21 21  GLUCOSE 121* 118*  BUN 22 19  CREATININE 1.62* 1.51*  CALCIUM 8.3* 8.6   Liver Function Tests:  Recent Labs Lab 11/11/12 1703  AST 17  ALT 10  ALKPHOS 46  BILITOT 0.2*  PROT 6.5  ALBUMIN 3.1*   CBC:  Recent Labs Lab 11/13/12 0430 11/14/12 0501  WBC 6.7 9.4  HGB 10.2* 11.3*  HCT 31.4* 35.2*  MCV 81.1 81.7  PLT 187 209   Cardiac Enzymes:  Recent Labs Lab 11/10/12 2342  TROPONINI <0.30     Micro Results: Recent Results (from the past 240 hour(s))  MRSA PCR SCREENING     Status: None   Collection Time    11/11/12  1:24 PM      Result Value Range Status   MRSA by PCR NEGATIVE  NEGATIVE Final   Comment:            The GeneXpert MRSA Assay (FDA     approved for NASAL specimens     only), is one component of a     comprehensive MRSA colonization     surveillance program. It is not     intended to diagnose MRSA     infection nor to guide or     monitor treatment for  MRSA infections.  BODY FLUID CULTURE     Status: None   Collection Time    11/13/12 12:00 PM      Result Value Range Status   Specimen Description FLUID PERICARDIAL   Final   Special Requests NONE   Final   Gram Stain     Final   Value: NO WBC SEEN     NO ORGANISMS SEEN   Culture PENDING   Incomplete   Report Status PENDING   Incomplete    Studies/Results: Dg Chest 2 View  11/11/2012  *RADIOLOGY REPORT*  Clinical Data: Chest pain.  CHEST - 2 VIEW  Comparison: 10/23/2007 and prior chest radiographs  Findings: Cardiomegaly and peribronchial thickening again noted. Left basilar scarring/density is unchanged. There is no evidence of pleural effusion, pneumothorax or pulmonary edema. No acute bony abnormalities  are identified.  IMPRESSION: Cardiomegaly without evidence of acute cardiopulmonary disease.   Original Report Authenticated By: Harmon Pier, M.D.    Dg Abd 2 Views  11/11/2012  *RADIOLOGY REPORT*  Clinical Data: Abdominal pain and vomiting.  ABDOMEN - 2 VIEW  Comparison: 01/15/2011 radiographs  Findings: Gas filled loops of colon and small bowel identified. No dilated loops of bowel are present. There is no evidence of pneumoperitoneum. No suspicious calcifications are noted. A severe thoracolumbar scoliosis is again identified.  IMPRESSION: Nonspecific nonobstructive bowel gas pattern - no evidence of pneumoperitoneum.   Original Report Authenticated By: Harmon Pier, M.D.     Medications: Scheduled Meds: . citalopram  5 mg Oral Daily  . cloNIDine  0.1 mg Oral BID  . docusate sodium  100 mg Oral BID  . hydrALAZINE  50 mg Oral TID  . labetalol  300 mg Oral BID  . lactulose  20 g Oral BID  . memantine  10 mg Oral BID  . pantoprazole (PROTONIX) IV  40 mg Intravenous Q12H  . simvastatin  40 mg Oral QPM  . tamsulosin  0.4 mg Oral Daily      LOS: 4 days   Jearld Hemp M.D. [facility] 11/14/2012, 9:02 AM Pager: [phone]  If 7PM-7AM, please contact night-coverage [email or web address] Password [email or web address]

## 2012-11-14 NOTE — Progress Notes (Signed)
SUBJECTIVE:  sleeping  OBJECTIVE:   Vitals:   Filed Vitals:   11/14/12 0600 11/14/12 0700 11/14/12 0737 11/14/12 0800  BP: 148/76 149/88  157/94  Pulse: 100 106  108  Temp:   98.1 F (36.7 C)   TempSrc:   Oral   Resp:    27  Weight:      SpO2: 99% 99%  99%   I&O's:   Intake/Output Summary (Last 24 hours) at 11/14/12 1610 Last data filed at 11/14/12 0700  Gross per 24 hour  Intake   1160 ml  Output   3215 ml  Net  -2055 ml   TELEMETRY: Reviewed telemetry pt in NSR:     PHYSICAL EXAM General: Well developed, well nourished, in no acute distress Head: Eyes PERRLA, No xanthomas.   Normal cephalic and atramatic  Lungs:   Clear bilaterally to auscultation and percussion. Heart:   HRRR S1 S2 Pulses are 2+ & equal. Abdomen: Bowel sounds are positive, abdomen soft and non-tender without masses Extremities:   No clubbing, cyanosis or edema.  DP +1 Neuro: Alert and oriented X 3. Psych:  Good affect, responds appropriately   LABS: Basic Metabolic Panel:  Recent Labs  96/04/54 0430 11/14/12 0501  NA 140 142  K 4.1 4.3  CL 111 109  CO2 21 21  GLUCOSE 121* 118*  BUN 22 19  CREATININE 1.62* 1.51*  CALCIUM 8.3* 8.6   Liver Function Tests:  Recent Labs  11/11/12 1703  AST 17  ALT 10  ALKPHOS 46  BILITOT 0.2*  PROT 6.5  ALBUMIN 3.1*   No results found for this basename: LIPASE, AMYLASE,  in the last 72 hours CBC:  Recent Labs  11/13/12 0430 11/14/12 0501  WBC 6.7 9.4  HGB 10.2* 11.3*  HCT 31.4* 35.2*  MCV 81.1 81.7  PLT 187 209   Cardiac Enzymes: No results found for this basename: CKTOTAL, CKMB, CKMBINDEX, TROPONINI,  in the last 72 hours BNP: No components found with this basename: POCBNP,  D-Dimer: No results found for this basename: DDIMER,  in the last 72 hours Hemoglobin A1C: No results found for this basename: HGBA1C,  in the last 72 hours Fasting Lipid Panel: No results found for this basename: CHOL, HDL, LDLCALC, TRIG, CHOLHDL, LDLDIRECT,   in the last 72 hours Thyroid Function Tests:  Recent Labs  11/12/12 2000  TSH 1.022   Anemia Panel: No results found for this basename: VITAMINB12, FOLATE, FERRITIN, TIBC, IRON, RETICCTPCT,  in the last 72 hours Coag Panel:   Lab Results  Component Value Date   INR 1.01 11/10/2012   INR 1.1 10/10/2007   INR 1.0 10/06/2007    RADIOLOGY: Ct Abdomen Pelvis Wo Contrast  11/11/2012  *RADIOLOGY REPORT*  Clinical Data: Distended abdomen with abdominal pain.  CT ABDOMEN AND PELVIS WITHOUT CONTRAST  Technique:  Multidetector CT imaging of the abdomen and pelvis was performed following the standard protocol without intravenous contrast.  Comparison: 12/10/2009  Findings: Small bilateral pleural effusions with atelectasis or infiltration in both lung bases.  Moderate sized pericardial effusion.  Cardiac enlargement.  Moderate sized esophageal hiatal hernia.  The unenhanced appearance of the liver, spleen, gallbladder, pancreas, adrenal glands, and retroperitoneal lymph nodes is unremarkable.  Mild calcification of the abdominal aorta without aneurysm.  Multiple parenchymal cysts in the right kidney corresponding to the previous study.  Largest is in the upper pole and measures 1.7 cm.  There is a hyper dense lesion in the upper pole of the  right kidney posteriorly measuring 7 mm consistent with a hemorrhagic cyst.  No hydronephrosis of the right kidney.  The left kidney demonstrates marked hydronephrosis and ureterectasis with dilated and tortuous aorta down to the level of the bladder. There is marked parenchymal atrophy in the left kidney suggesting that this represents a longstanding process.  The appearance is similar to the previous study, allowing for technical differences. The stomach, small bowel, and colon are not abnormally distended. No abdominal ascites.  Pelvis:  There is marked distension of the bladder suggesting possible bladder outlet obstruction versus dysmotility.  The prostate gland is not  appear to be enlarged.  The appearance is stable since the previous study.  No free or loculated pelvic fluid collections.  The rectum is decompressed but there is apparent thickening of the wall of the anorectal junction.  Was present previously.  This could represent chronic inflammatory process. Mass lesion can also have this appearance.  The appendix is normal. No diverticulitis.  Lumbar scoliosis convex towards the right.  Degenerative changes in the lumbar spine and hips.  IMPRESSION: Small bilateral pleural effusions with basilar atelectasis or infiltration.  Cardiac enlargement with pericardial effusion. Prominent pyelocaliectasis and ureterectasis of the left kidney with parenchymal atrophy.  Distended bladder without wall thickening.  Thickening of the wall of the anorectal junction. These findings are similar to previous study.  Clinical appearance of abdominal distension is likely to be due to the distended bladder.   Original Report Authenticated By: Burman Nieves, M.D.    Dg Chest 2 View  11/11/2012  *RADIOLOGY REPORT*  Clinical Data: Chest pain.  CHEST - 2 VIEW  Comparison: 10/23/2007 and prior chest radiographs  Findings: Cardiomegaly and peribronchial thickening again noted. Left basilar scarring/density is unchanged. There is no evidence of pleural effusion, pneumothorax or pulmonary edema. No acute bony abnormalities are identified.  IMPRESSION: Cardiomegaly without evidence of acute cardiopulmonary disease.   Original Report Authenticated By: Harmon Pier, M.D.    Ct Head Wo Contrast  11/12/2012  *RADIOLOGY REPORT*  Clinical Data: 67 year old male with right hemipareses.  History of dementia, prior infarct  CT HEAD WITHOUT CONTRAST  Technique:  Contiguous axial images were obtained from the base of the skull through the vertex without contrast.  Comparison: None  Findings: Left hemispheric encephalomalacia noted. Chronic small vessel white matter ischemic changes are present. A 1.7 cm  calcified area in the left periventricular region is likely chronic. No acute intracranial abnormalities are identified, including mass effect, hydrocephalus, extra-axial fluid collection, midline shift, hemorrhage, or acute infarction.  Left frontotemporal craniectomy noted.  IMPRESSION: No evidence of acute intracranial abnormality.  Left hemispheric encephalomalacia and chronic small vessel white matter ischemic changes.   Original Report Authenticated By: Harmon Pier, M.D.    Dg Abd 2 Views  11/11/2012  *RADIOLOGY REPORT*  Clinical Data: Abdominal pain and vomiting.  ABDOMEN - 2 VIEW  Comparison: 01/15/2011 radiographs  Findings: Gas filled loops of colon and small bowel identified. No dilated loops of bowel are present. There is no evidence of pneumoperitoneum. No suspicious calcifications are noted. A severe thoracolumbar scoliosis is again identified.  IMPRESSION: Nonspecific nonobstructive bowel gas pattern - no evidence of pneumoperitoneum.   Original Report Authenticated By: Harmon Pier, M.D.    Assessment/Plan:  Principal Problem:  Pericardial effusion  Active Problems:  Dementia  Stroke  GERD (gastroesophageal reflux disease)  Hematemesis/vomiting blood  UGIB (upper gastrointestinal bleed)  Hemiparesis affecting right side as late effect of stroke  Expressive aphasia  Hypertension,  accelerated   67 year old with prior stroke, admitted with nausea vomiting, hematemesis, hemoglobin now stable, no further bleeding, EGD unremarkable with incidental finding of large pericardial effusion on abdominal CT scan with subsequent echocardiogram demonstrating large circumferential pericardial effusion, hemodynamically stable, Cardiomyopathy, EF 35-40%, hypertension, left bundle branch block.  - Appreciate TCTS consultation for pericardial window. Cytology, tissue. Question underlying etiology. Sed rate 22, not very elevated, TSH 1.0-normal, rheumatoid factor less than 10. No recent cough, unexplained  weight loss. Chest x-ray with cardiomegaly and no evidence of acute cardiopulmonary disease. Now s/p Pericardial drainage and window with 200-250cc of fluid removed.  LDH elevated.  Cultures and cytology pending. -  Continue beta blocker/amlodipine/clonidine and hydralazine for BP control     Ricky Reichert, MD  11/14/2012  9:06 AM

## 2012-11-14 NOTE — Op Note (Signed)
NAMEMarland Snyder  ALONTAE, [name]:  [id number]  MEDICAL RECORD NO.:  [id number]  LOCATION:  2308                         FACILITY:  [facility]  PHYSICIAN:  Sheliah Plane, MD    DATE OF BIRTH:  26-Mar-1946  DATE OF PROCEDURE:  11/13/2012 DATE OF DISCHARGE:                              OPERATIVE REPORT   PREOPERATIVE DIAGNOSIS:  Moderate pericardial effusion.  POSTOPERATIVE DIAGNOSIS:  Moderate pericardial effusion.  PROCEDURE:  Subxiphoid pericardial window and drainage of pericardial effusion.  SURGEON:  Sheliah Plane, MD  FIRST ASSISTANT:  Pauline Good, PA  BRIEF HISTORY:  The patient is a 67 year old male who has a history of stroke, hemiparesis involving the right side and expressive aphasia and dementia.  He lives in a nursing home and was brought to the [facility] because of hematemesis and with GI bleed.  While here, he has undergone upper GI endoscopy and colonoscopy, which were unrevealing. In addition, a CT scan of the abdomen was performed, which found incidental finding of moderate to large pericardial effusion without hemodynamic consequence.  He was seen in consultation by Cardiology, Dr. Anne Fu, who had recommended surgical drainage of his pericardial effusion,  both for control of potential tamponade symptoms and also diagnostically to see the cause of the effusion.  The patient although has expressive aphasia.  The procedure was explained to him and he agreed.  In addition, we contacted his brother, who is noted as his next of kin, and discussed the planned procedure.  DESCRIPTION OF PROCEDURE:  The patient was taken to the operating room, hemodynamically stable.  He underwent general endotracheal anesthesia without incident.  A TEE probe was placed by Dr. Jacklynn Bue, which confirmed a moderate size pericardial effusion and approximately 30% ejection fraction.  There was some slight collapse of the right atrium. The skin of the chest was prepped  with Betadine and draped in sterile manner.  Incision was made over the suprasternal notch and dissection carried down to the upper abdomen and lower pericardium.  The anterior pericardium was identified and opened and approximately 200-250 mL of straw-colored pericardial fluid was drained.  It is likely high in protein as it coagulated fairly rapidly.  Fluid was obtained for cytology and for cultures. It is likely that the fluid congealed before it could be placed in tubes for cell count, LDH, protein, and glucose. With the fluid totally removed a jet drain was left in the inferior aspect of the pericardium brought out through a separate site just to the right of the incision.  The incision was then closed with interrupted 0 Vicryl, running 3-0 Vicryl subcutaneous tissue, and Dermabond placed.  The patient tolerated the procedure without obvious complication.  He was extubated in the operating room and transferred to the recovery room for further postoperative care.     Sheliah Plane, MD     EG/MEDQ  D:  11/13/2012  T:  11/14/2012  Job:  [id number]  cc:   Jake Bathe, MD

## 2012-11-14 NOTE — Progress Notes (Signed)
   CARDIOTHORACIC SURGERY PROGRESS NOTE  1 Day Post-Op  S/P Procedure(s) (LRB): SUBXYPHOID PERICARDIAL WINDOW (N/A)  Subjective: No complaints except mild soreness in epigastric region from surgery and tube  Objective: Vital signs in last 24 hours: Temp:  [97.1 F (36.2 C)-98.6 F (37 C)] 98.1 F (36.7 C) (03/01 0737) Pulse Rate:  [61-110] 108 (03/01 0800) Cardiac Rhythm:  [-] Sinus tachycardia (03/01 0400) Resp:  [16-34] 27 (03/01 0800) BP: (90-157)/(42-94) 157/94 mmHg (03/01 0800) SpO2:  [93 %-100 %] 99 % (03/01 0800) Arterial Line BP: (129-140)/(76-88) 140/76 mmHg (02/28 1315) Weight:  [92.7 kg (204 lb 5.9 oz)] 92.7 kg (204 lb 5.9 oz) (03/01 0500)  Physical Exam:  Rhythm:   sinus  Breath sounds: clear  Heart sounds:  RRR  Incisions:  Dressing dry, intact  Abdomen:  soft  Extremities:  warm  Chest tube(s):  Trivial output   Intake/Output from previous day: 02/28 0701 - 03/01 0700 In: 1160 [I.V.:1160] Out: 3215 [Urine:2765; Chest Tube:450] Intake/Output this shift:    Lab Results:  Recent Labs  11/13/12 0430 11/14/12 0501  WBC 6.7 9.4  HGB 10.2* 11.3*  HCT 31.4* 35.2*  PLT 187 209   BMET:  Recent Labs  11/13/12 0430 11/14/12 0501  NA 140 142  K 4.1 4.3  CL 111 109  CO2 21 21  GLUCOSE 121* 118*  BUN 22 19  CREATININE 1.62* 1.51*  CALCIUM 8.3* 8.6    CBG (last 3)  No results found for this basename: GLUCAP,  in the last 72 hours  CXR:  pending  Assessment/Plan: S/P Procedure(s) (LRB): SUBXYPHOID PERICARDIAL WINDOW (N/A)  Doing well s/p pericardial window Trivial chest tube output Will d/c tube today Mobilize as much as possible Consider removal of foley catheter - defer to primary team Remainder of care per primary team  [name],CLARENCE H 11/14/2012 10:35 AM

## 2012-11-14 NOTE — Progress Notes (Signed)
Dr. Isidoro Donning notified of patient's coffee ground emesis and results of KUB. Orders received.

## 2012-11-14 NOTE — Progress Notes (Signed)
Dr. Isidoro Donning notified of patient's decreased urine output of 15 ml/hr x 3 hours and abdominal distention. Lungs essentially clear with decreased bases. RR 35-40. Bowel sounds hypoactive but present. Sound slightly tympanic. Orders received for Portable KUB. Will leave in SICU today.

## 2012-11-14 NOTE — Op Note (Signed)
NAME:  DENNARD, VEZINA NO.:  [id number]  MEDICAL RECORD NO.:  [id number]  LOCATION:  2308                         FACILITY:  [facility]  PHYSICIAN:  Burna Forts, M.D.DATE OF BIRTH:  26-Aug-1946  DATE OF PROCEDURE:  11/13/2012 DATE OF DISCHARGE:                              OPERATIVE REPORT   INDICATION FOR PROCEDURE:  Mr. Macera is a 67 year old black male, who presents to the OR for subxiphoid window for drainage of a pericardial effusion.  Procedures to be performed by Dr. Sheliah Plane.  We have been asked to place a TEE probe for pre and post drainage of this procedure.  The patient was taken to the OR and after routine induction of general anesthesia, the probe is prepared and then passed oropharyngeally into the stomach, then slightly withdrawn for imaging of the cardiac structures.  This is not a complete TEE exam for monitoring purposes only.  initially, the left ventricular chamber is seen initially in a short axis view.  With this view, there is a moderate sized pericardial effusion appreciated that extends anteriorly and anterolaterally around the heart.  Both long and short axis views are obtained.  Again, this is a moderate-sized infusion.  In the short-axis view, there is indication of the moderately depressed left ventricular function.  The overall ejection fraction is estimated to be only around 35%.  Again, multiple views were obtained of the left ventricular chamber.  On a four-chamber view, we are able to demonstrate only trivial mitral regurgitant flow across the valve.  Right atrium was able to be visualized with ease and there was only mild collapse during systole and diastole cycle.  The right ventricle was then looked at closely.  There some mild compression from a very small amount of fluid, also along the right ventricular wall.  Overall, there was no significant compression of the right ventricular function.  The procedure was  carried out following by immediate drainage of the pericardial infusion and good appearance of that drainage was well seen on the echo, again showing a left ventricular chamber and short axis view now in the pericardial sac with the absence of that fluid.  Again, multiple images are obtained. Pericardial effusion was effectively drained.  The patient was awake and extubated and transferred to the PACU.          ______________________________ Burna Forts, M.D.     JTM/MEDQ  D:  11/13/2012  T:  11/14/2012  Job:  [id number]

## 2012-11-14 NOTE — Plan of Care (Signed)
Problem: Phase I Progression Outcomes Goal: Tubes/drains patent Outcome: Completed/Met Date Met:  11/14/12 Pericardial drain discontinued

## 2012-11-14 NOTE — Plan of Care (Signed)
Problem: Phase I Progression Outcomes Goal: OOB as tolerated unless otherwise ordered Outcome: Completed/Met Date Met:  11/14/12 Heavy 2 person pivot and use gait belt

## 2012-11-15 ENCOUNTER — Inpatient Hospital Stay (HOSPITAL_COMMUNITY): Payer: Medicare Other

## 2012-11-15 DIAGNOSIS — K560 Paralytic ileus: Secondary | ICD-10-CM

## 2012-11-15 DIAGNOSIS — R1013 Epigastric pain: Secondary | ICD-10-CM

## 2012-11-15 LAB — CBC
HCT: 30.6 % — ABNORMAL LOW (ref 39.0–52.0)
MCH: 26.5 pg (ref 26.0–34.0)
MCV: 81.2 fL (ref 78.0–100.0)
RBC: 3.77 MIL/uL — ABNORMAL LOW (ref 4.22–5.81)
WBC: 7.9 10*3/uL (ref 4.0–10.5)

## 2012-11-15 LAB — BASIC METABOLIC PANEL
BUN: 18 mg/dL (ref 6–23)
CO2: 22 mEq/L (ref 19–32)
Chloride: 112 mEq/L (ref 96–112)
Creatinine, Ser: 1.41 mg/dL — ABNORMAL HIGH (ref 0.50–1.35)

## 2012-11-15 MED ORDER — LACTULOSE 10 GM/15ML PO SOLN
20.0000 g | Freq: Three times a day (TID) | ORAL | Status: DC
  Filled 2012-11-15 (×3): qty 30

## 2012-11-15 MED ORDER — BIOTENE DRY MOUTH MT LIQD
15.0000 mL | Freq: Two times a day (BID) | OROMUCOSAL | Status: DC
  Administered 2012-11-15 – 2012-11-20 (×10): 15 mL via OROMUCOSAL

## 2012-11-15 MED ORDER — CHLORHEXIDINE GLUCONATE 0.12 % MT SOLN
15.0000 mL | Freq: Two times a day (BID) | OROMUCOSAL | Status: DC
  Administered 2012-11-15 – 2012-11-20 (×10): 15 mL via OROMUCOSAL
  Filled 2012-11-15 (×13): qty 15

## 2012-11-15 MED ORDER — METOPROLOL TARTRATE 1 MG/ML IV SOLN
5.0000 mg | Freq: Four times a day (QID) | INTRAVENOUS | Status: DC
  Administered 2012-11-15 – 2012-11-17 (×9): 5 mg via INTRAVENOUS
  Filled 2012-11-15 (×15): qty 5

## 2012-11-15 MED ORDER — DEXTROSE-NACL 5-0.45 % IV SOLN
INTRAVENOUS | Status: DC
  Administered 2012-11-15: 08:00:00 via INTRAVENOUS

## 2012-11-15 MED ORDER — LACTULOSE 10 GM/15ML PO SOLN
20.0000 g | Freq: Three times a day (TID) | ORAL | Status: DC
  Administered 2012-11-15 – 2012-11-16 (×3): 20 g via ORAL
  Filled 2012-11-15 (×5): qty 30

## 2012-11-15 MED ORDER — ACETAMINOPHEN 650 MG RE SUPP: 650.0000 mg | RECTAL | Status: DC | PRN

## 2012-11-15 MED ORDER — BISACODYL 10 MG RE SUPP
10.0000 mg | Freq: Every day | RECTAL | Status: DC
  Administered 2012-11-15 – 2012-11-18 (×4): 10 mg via RECTAL
  Filled 2012-11-15 (×4): qty 1

## 2012-11-15 MED ORDER — LACTULOSE 10 GM/15ML PO SOLN
30.0000 g | Freq: Once | ORAL | Status: AC
  Administered 2012-11-15: 30 g via ORAL
  Filled 2012-11-15: qty 45

## 2012-11-15 NOTE — Consult Note (Signed)
[name] 11-Mar-1946  [id number].    Requesting MD: [name] Chief Complaint/Reason for Consult: abdominal pain, N/V, post op ileus HPI:  67 y/o male POD #2 after a pericardial window for pericardial effusion by Dr. [name].  Yesterday he developed epigastric abdominal pain and distension, nausea and vomiting.  The patient is not able to give me an accurate history or current complaints.  His vomitus was described as coffee grounds.  He recently had a EGD done on 2/26 which showed no active bleeding, but minimal antral gastritis and a hiatal hernia.  He is currently on Protonix.  He is currently NPO and with NG tube per medicine service.  Pt currently complaining of epigastric abdominal pain and distension but notes its better since the NG was put in.  Pt is not currently having BM's or passing much gas.     History reviewed. No pertinent family history.  Past Medical History  Diagnosis Date  . Dementia   . Stroke   . Hydronephrosis   . Hypertension   . Hyperlipemia   . GERD (gastroesophageal reflux disease)   . BPH (benign prostatic hyperplasia)     Past Surgical History  Procedure Laterality Date  . Esophagogastroduodenoscopy N/A 11/11/2012    Procedure: ESOPHAGOGASTRODUODENOSCOPY (EGD);  Surgeon: [name], MD;  Location: [facility];  Service: Endoscopy;  Laterality: N/A;    Social History:  reports that he has never smoked. He has never used smokeless tobacco. He reports that he does not drink alcohol or use illicit drugs. -Ricky asking the patient he states he smokes 1ppd, drinks and has done "dope" - "you ask me and I've done it"  Allergies: No Known Allergies  Medications Prior to Admission  Medication Sig Dispense Refill  . amLODipine (NORVASC) 10 MG tablet Take 10 mg by mouth daily.      . bisacodyl (BISACODYL) 5 MG EC tablet Take 15 mg by mouth at bedtime.      . citalopram (CELEXA) 10 MG tablet Take 5 mg by mouth daily.      . cloNIDine  (CATAPRES) 0.3 MG tablet Take 0.3 mg by mouth 2 (two) times daily.      Marland Kitchen docusate sodium (COLACE) 100 MG capsule Take 100 mg by mouth 2 (two) times daily.      . hydrALAZINE (APRESOLINE) 50 MG tablet Take 50 mg by mouth 3 (three) times daily.      Marland Kitchen labetalol (NORMODYNE) 300 MG tablet Take 300 mg by mouth 2 (two) times daily.      Marland Kitchen lactulose (CHRONULAC) 10 GM/15ML solution Take 30 mLs by mouth 2 (two) times daily.      . memantine (NAMENDA) 10 MG tablet Take 10 mg by mouth 2 (two) times daily.      Marland Kitchen omeprazole (PRILOSEC) 20 MG capsule Take 20 mg by mouth daily.      . simvastatin (ZOCOR) 40 MG tablet Take 40 mg by mouth every evening.      . tamsulosin (FLOMAX) 0.4 MG CAPS Take 0.4 mg by mouth daily.        Blood pressure 131/76, pulse 104, temperature 98.6 F (37 C), temperature source Oral, resp. rate 17, weight 203 lb 11.3 oz (92.4 kg), SpO2 100.00%. Physical Exam: General: pleasant, WD/WN AA male who is laying in bed in NAD HEENT: head is normocephalic, atraumatic.  Sclera are noninjected.  PERRL.  Ears and nose without any masses or lesions.  Mouth is pink and moist Heart: regular, rate,  and rhythm.  No obvious murmurs, gallops, or rubs noted.  Palpable pedal pulses bilaterally Lungs: CTAB, no wheezes, rhonchi, or rales noted.  Respiratory effort nonlabored Abd: soft, mild distension, moderate tenderness in epigastrium, diminished BS, no masses, hernias, or organomegaly; bandage over right upper abdomen MS: all 4 extremities are symmetrical with no cyanosis, clubbing, or edema. Skin: warm and dry with no masses, lesions, or rashes Psych: A&Ox3 with an appropriate affect.  Results for orders placed during the hospital encounter of 11/10/12 (from the past 48 hour(s))  AFB CULTURE WITH SMEAR     Status: None   Collection Time    11/13/12 12:00 PM      Result Value Range   Specimen Description FLUID PERICARDIAL     Special Requests NONE     ACID FAST SMEAR NO ACID FAST BACILLI SEEN      Culture       Value: CULTURE WILL BE EXAMINED FOR 6 WEEKS BEFORE ISSUING A FINAL REPORT   Report Status PENDING    FUNGUS CULTURE W SMEAR     Status: None   Collection Time    11/13/12 12:00 PM      Result Value Range   Specimen Description FLUID PERICARDIAL     Special Requests NONE     Fungal Smear NO YEAST OR FUNGAL ELEMENTS SEEN     Culture CULTURE IN PROGRESS FOR FOUR WEEKS     Report Status PENDING    BODY FLUID CULTURE     Status: None   Collection Time    11/13/12 12:00 PM      Result Value Range   Specimen Description FLUID PERICARDIAL     Special Requests NONE     Gram Stain       Value: NO WBC SEEN     NO ORGANISMS SEEN   Culture NO GROWTH 1 DAY     Report Status PENDING    BODY FLUID CELL COUNT WITH DIFFERENTIAL     Status: Abnormal   Collection Time    11/13/12 12:02 PM      Result Value Range   Fluid Type-FCT PERICARDIAL     Color, Fluid AMBER (*) YELLOW   Appearance, Fluid HAZY (*) CLEAR   WBC, Fluid 77  0 - 1000 cu mm   Neutrophil Count, Fluid 21  0 - 25 %   Lymphs, Fluid 75     Monocyte-Macrophage-Serous Fluid 4 (*) 50 - 90 %   Eos, Fluid 0    LACTATE DEHYDROGENASE, BODY FLUID     Status: Abnormal   Collection Time    11/13/12 12:02 PM      Result Value Range   LD, Fluid 165 (*) 3 - 23 U/L   Fluid Type-FLDH PERICARDIAL    GLUCOSE, SEROUS FLUID     Status: None   Collection Time    11/13/12 12:02 PM      Result Value Range   Glucose, Fluid 116     Comment:            FLUID GLUCOSE LEVELS OF <60     mg/dL OR VALUES OF 40 mg/dL     LESS THAN A SIMULTANEOUS     SERUM LEVEL ARE CONSIDERED     DECREASED.   Fluid Type-FGLU PERICARDIAL    PROTEIN, BODY FLUID     Status: None   Collection Time    11/13/12 12:02 PM      Result Value Range   Total protein, fluid  3.9     Comment: NO NORMAL RANGE ESTABLISHED FOR THIS TEST   Fluid Type-FTP PERICARDIAL    CBC     Status: Abnormal   Collection Time    11/14/12  5:01 AM      Result Value Range   WBC  9.4  4.0 - 10.5 K/uL   RBC 4.31  4.22 - 5.81 MIL/uL   Hemoglobin 11.3 (*) 13.0 - 17.0 g/dL   HCT 16.1 (*) 09.6 - 04.5 %   MCV 81.7  78.0 - 100.0 fL   MCH 26.2  26.0 - 34.0 pg   MCHC 32.1  30.0 - 36.0 g/dL   RDW 40.9  81.1 - 91.4 %   Platelets 209  150 - 400 K/uL  BASIC METABOLIC PANEL     Status: Abnormal   Collection Time    11/14/12  5:01 AM      Result Value Range   Sodium 142  135 - 145 mEq/L   Potassium 4.3  3.5 - 5.1 mEq/L   Chloride 109  96 - 112 mEq/L   CO2 21  19 - 32 mEq/L   Glucose, Bld 118 (*) 70 - 99 mg/dL   BUN 19  6 - 23 mg/dL   Creatinine, Ser 7.82 (*) 0.50 - 1.35 mg/dL   Calcium 8.6  8.4 - 95.6 mg/dL   GFR calc non Af Amer 46 (*) >90 mL/min   GFR calc Af Amer 53 (*) >90 mL/min   Comment:            The eGFR has been calculated     using the CKD EPI equation.     This calculation has not been     validated in all clinical     situations.     eGFR's persistently     <90 mL/min signify     possible Chronic Kidney Disease.  CBC     Status: Abnormal   Collection Time    11/15/12  5:50 AM      Result Value Range   WBC 7.9  4.0 - 10.5 K/uL   RBC 3.77 (*) 4.22 - 5.81 MIL/uL   Hemoglobin 10.0 (*) 13.0 - 17.0 g/dL   HCT 21.3 (*) 08.6 - 57.8 %   MCV 81.2  78.0 - 100.0 fL   MCH 26.5  26.0 - 34.0 pg   MCHC 32.7  30.0 - 36.0 g/dL   RDW 46.9  62.9 - 52.8 %   Platelets 203  150 - 400 K/uL  BASIC METABOLIC PANEL     Status: Abnormal   Collection Time    11/15/12  5:50 AM      Result Value Range   Sodium 143  135 - 145 mEq/L   Potassium 4.1  3.5 - 5.1 mEq/L   Chloride 112  96 - 112 mEq/L   CO2 22  19 - 32 mEq/L   Glucose, Bld 117 (*) 70 - 99 mg/dL   BUN 18  6 - 23 mg/dL   Creatinine, Ser 4.13 (*) 0.50 - 1.35 mg/dL   Calcium 8.3 (*) 8.4 - 10.5 mg/dL   GFR calc non Af Amer 50 (*) >90 mL/min   GFR calc Af Amer 58 (*) >90 mL/min   Comment:            The eGFR has been calculated     using the CKD EPI equation.     This calculation has not been     validated  in  all clinical     situations.     eGFR's persistently     <90 mL/min signify     possible Chronic Kidney Disease.   Dg Chest Port 1 View  11/14/2012  *RADIOLOGY REPORT*  Clinical Data: Shortness of breath.  PORTABLE CHEST - 1 VIEW  Comparison: 11/10/2012  Findings: Very low lung volumes.  There is cardiomegaly with vascular congestion and bibasilar atelectasis.  Lung volumes have decreased.  No acute bony abnormality or visible effusions.  IMPRESSION: Very low lung volumes.  Cardiomegaly with vascular congestion and bibasilar atelectasis.   Original Report Authenticated By: Charlett Nose, M.D.    Dg Abd Portable 1v  11/15/2012  *RADIOLOGY REPORT*  Clinical Data: Evaluate for free air.  PORTABLE ABDOMEN - 1 VIEW  Comparison: Supine abdominal image 11/15/2012.  Findings: Decubitus view demonstrates continued mild diffuse gaseous distention of bowel.  No free air on decubitus view.  IMPRESSION: No free air.   Original Report Authenticated By: Charlett Nose, M.D.    Dg Abd Portable 1v  11/15/2012  *RADIOLOGY REPORT*  Clinical Data: Ileus  PORTABLE ABDOMEN - 1 VIEW  Comparison: 11/14/2012  Findings: Gas distended large and small bowel.  Findings may represent an ileus without significant change from prior examination. Distal obstructing lesion not excluded.  Bowel walls well visualized which may be related to approximation of gas distended loops.  Free air cannot be excluded.  Decubitus view may be considered.  Nasogastric tube may be in place projecting to the right of midline.  Significant scoliosis.  IMPRESSION: Persistent gas distended bowel which may reflect ileus.  Distal obstructing lesion not excluded.  Free air not excluded and decubitus view recommended for further delineation.  Critical Value/emergent results were called by telephone at the time of interpretation on 11/15/2012 at 8:05 a.m. to [facility] the patient's nurse , who verbally acknowledged these results.   Original Report Authenticated By: Lacy Duverney, M.D.    Dg Abd Portable 1v  11/14/2012  *RADIOLOGY REPORT*  Clinical Data: Ileus  PORTABLE ABDOMEN - 1 VIEW  Comparison: CT abdomen pelvis dated 11/11/2012  Findings: Gaseous distention of small and large bowel, suggesting diffuse adynamic ileus.  Thoracolumbar scoliosis.  IMPRESSION: Suspected diffuse adynamic ileus.   Original Report Authenticated By: Charline Bills, M.D.        Assessment/Plan Post-operative Ileus POD #2 s/p Pericardial window for effusion 1.  Cont conservative mgt (IVF, pain control, NPO, and NG tube 2.  Ambulate and IS when able 3.  Will likely resolve in a few days; when BS return, passing gas, and/or having BM's can d/c NG and start to advance diet 4.  Repeat KUB tomorrow AM  Hematemesis - cont protonix, watch H&H, GI consult?  Aris Georgia 11/15/2012, 11:35 AM Pager: [phone]

## 2012-11-15 NOTE — Progress Notes (Signed)
Patient ID: [name]  male  ONG:[id number]    DOB: [birth year]    DOA: 11/10/2012  PCP: No primary provider on file.  Assessment/Plan: Principal Problem:  Postop ileus: Abdominal distention slightly down today, still hypoactive  - DC all oral meds, NPO, gentle hydration on lactulose, dulcolax supp, placed on NGT suction yesterday, mobilize, stopped all narcotics. - KUB this morning shows no free air but no significant improvement - Repeat KUB in the morning tomorrow, d/w surgery (Dr Harlon Flor) rec'ed cont current management. CTAbd was done on 2/26 which did not show obstruction lesion or abd pathology  Pericardial effusion: s/p pericardial window, pod#2 - ECHO :showed large free flowing pericardial effusion, no tamponade physiology, EF 35-40%, Possible hypokinesis of the mid-distalinferoseptal myocardium. - Appreciate CT surgery assistance, s/p Subxiphoid pericardial window and drainage of pericardial effusion. - Fluid cultures negative so far, tube out last night.   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 rate 17, weight 92.4 kg (203 lb 11.3 oz), SpO2 100.00%.  Physical Exam: General: Alert and awake, oriented, dysarthria, NGT suctioning CVS: S1-S2 clear Chest: CTAB, Abdomen: soft, hypoactive bowel sounds, distended  Extremities: no c/c/e bilaterally Neuro: right-sided weakness (from prior stroke), dysarthria   Lab Results: Basic Metabolic Panel:  Recent Labs Lab 11/14/12 0501 11/15/12 0550  NA 142 143  K 4.3 4.1  CL 109 112  CO2 21 22  GLUCOSE 118* 117*  BUN 19 18  CREATININE 1.51* 1.41*  CALCIUM 8.6 8.3*   Liver Function Tests:  Recent Labs Lab 11/11/12 1703  AST 17  ALT 10  ALKPHOS 46  BILITOT 0.2*  PROT 6.5  ALBUMIN 3.1*   CBC:  Recent Labs Lab 11/14/12 0501 11/15/12 0550  WBC 9.4 7.9  HGB 11.3* 10.0*  HCT 35.2* 30.6*  MCV 81.7 81.2  PLT 209 203   Cardiac Enzymes:  Recent Labs Lab 11/10/12 2342  TROPONINI <0.30     Micro Results: Recent Results (from the past 240 hour(s))  MRSA PCR SCREENING     Status: None   Collection Time    11/11/12  1:24 PM      Result Value Range Status   MRSA by PCR NEGATIVE  NEGATIVE Final   Comment:            The  GeneXpert MRSA Assay (FDA     approved for NASAL specimens     only), is one component of a     comprehensive MRSA colonization     surveillance program. It is not     intended to diagnose MRSA     infection nor to guide or     monitor treatment for     MRSA infections.  AFB CULTURE WITH SMEAR     Status: None   Collection Time    11/13/12 12:00 PM      Result Value Range Status   Specimen Description FLUID PERICARDIAL   Final   Special Requests NONE   Final   ACID FAST SMEAR  NO ACID FAST BACILLI SEEN   Final   Culture     Final   Value: CULTURE WILL BE EXAMINED FOR 6 WEEKS BEFORE ISSUING A FINAL REPORT   Report Status PENDING   Incomplete  FUNGUS CULTURE W SMEAR     Status: None   Collection Time    11/13/12 12:00 PM      Result Value Range Status   Specimen Description FLUID PERICARDIAL   Final   Special Requests NONE   Final   Fungal Smear NO YEAST OR FUNGAL ELEMENTS SEEN   Final   Culture CULTURE IN PROGRESS FOR FOUR WEEKS   Final   Report Status PENDING   Incomplete  BODY FLUID CULTURE     Status: None   Collection Time    11/13/12 12:00 PM      Result Value Range Status   Specimen Description FLUID PERICARDIAL   Final   Special Requests NONE   Final   Gram Stain     Final   Value: NO WBC SEEN     NO ORGANISMS SEEN   Culture NO GROWTH 1 DAY   Final   Report Status PENDING   Incomplete    Studies/Results: Dg Chest 2 View  11/11/2012  *RADIOLOGY REPORT*  Clinical Data: Chest pain.  CHEST - 2 VIEW  Comparison: 10/23/2007 and prior chest radiographs  Findings: Cardiomegaly and peribronchial thickening again noted. Left basilar scarring/density is unchanged. There is no evidence of pleural effusion, pneumothorax or pulmonary edema. No acute bony abnormalities are identified.  IMPRESSION: Cardiomegaly without evidence of acute cardiopulmonary disease.   Original Report Authenticated By: Harmon Pier, M.D.    Dg Abd 2 Views  11/11/2012  *RADIOLOGY REPORT*  Clinical Data: Abdominal pain and vomiting.  ABDOMEN - 2 VIEW  Comparison: 01/15/2011 radiographs  Findings: Gas filled loops of colon and small bowel identified. No dilated loops of bowel are present. There is no evidence of pneumoperitoneum. No suspicious calcifications are noted. A severe thoracolumbar scoliosis is again identified.  IMPRESSION: Nonspecific nonobstructive bowel gas pattern - no evidence of pneumoperitoneum.   Original Report Authenticated By: Harmon Pier, M.D.     Medications: Scheduled  Meds: . bisacodyl  10 mg Rectal Daily  . citalopram  5 mg Oral Daily  . lactulose  20 g Oral TID  . memantine  10 mg Oral BID  . metoprolol  5 mg Intravenous Q6H  . pantoprazole (PROTONIX) IV  40 mg Intravenous Q12H      LOS: 5 days   RAI,RIPUDEEP M.D. [facility] 11/15/2012, 10:05 AM Pager: [phone]  If 7PM-7AM, please contact night-coverage [email or web address] Password [email or web address]

## 2012-11-15 NOTE — Consult Note (Signed)
Agree with above. Adynamic ileus which has been present since admission.  Maintain normal electrolytes. NG suction. Bowel rest.  Will follow. No acute surgical indications.  Wilmon Arms. Corliss Skains, MD, [facility]  11/15/2012 12:15 PM

## 2012-11-15 NOTE — Progress Notes (Addendum)
SUBJECTIVE:  No compliants of chest pain but had vomiting with coffee ground emesis last PM and now with NGT in place  OBJECTIVE:   Vitals:   Filed Vitals:   11/15/12 0500 11/15/12 0600 11/15/12 0700 11/15/12 0800  BP: 131/62 140/65 126/68 131/76  Pulse: 105 104 106 104  Temp:      TempSrc:      Resp: 19 16 21 17   Weight:  92.4 kg (203 lb 11.3 oz)    SpO2: 100% 100% 98% 100%   I&O's:   Intake/Output Summary (Last 24 hours) at 11/15/12 4098 Last data filed at 11/15/12 0800  Gross per 24 hour  Intake 881.33 ml  Output   1256 ml  Net -374.67 ml   TELEMETRY: Reviewed telemetry pt in NSR-sinus tachcyardia     PHYSICAL EXAM General: Well developed, well nourished, in no acute distress Head: Eyes PERRLA, No xanthomas.   Normal cephalic and atramatic  Lungs:   Clear bilaterally to auscultation and percussion. Heart:   HRRR S1 S2 Pulses are 2+ & equal. Abdomen: distended abdomen but soft Extremities:   No clubbing, cyanosis or edema.  DP +1 Neuro: Alert and oriented X 3. Psych:  Good affect, responds appropriately   LABS: Basic Metabolic Panel:  Recent Labs  11/91/47 0501 11/15/12 0550  NA 142 143  K 4.3 4.1  CL 109 112  CO2 21 22  GLUCOSE 118* 117*  BUN 19 18  CREATININE 1.51* 1.41*  CALCIUM 8.6 8.3*   Liver Function Tests: No results found for this basename: AST, ALT, ALKPHOS, BILITOT, PROT, ALBUMIN,  in the last 72 hours No results found for this basename: LIPASE, AMYLASE,  in the last 72 hours CBC:  Recent Labs  11/14/12 0501 11/15/12 0550  WBC 9.4 7.9  HGB 11.3* 10.0*  HCT 35.2* 30.6*  MCV 81.7 81.2  PLT 209 203   Cardiac Enzymes: No results found for this basename: CKTOTAL, CKMB, CKMBINDEX, TROPONINI,  in the last 72 hours BNP: No components found with this basename: POCBNP,  D-Dimer: No results found for this basename: DDIMER,  in the last 72 hours Hemoglobin A1C: No results found for this basename: HGBA1C,  in the last 72 hours Fasting Lipid  Panel: No results found for this basename: CHOL, HDL, LDLCALC, TRIG, CHOLHDL, LDLDIRECT,  in the last 72 hours Thyroid Function Tests:  Recent Labs  11/12/12 2000  TSH 1.022   Anemia Panel: No results found for this basename: VITAMINB12, FOLATE, FERRITIN, TIBC, IRON, RETICCTPCT,  in the last 72 hours Coag Panel:   Lab Results  Component Value Date   INR 1.01 11/10/2012   INR 1.1 10/10/2007   INR 1.0 10/06/2007    RADIOLOGY: Ct Abdomen Pelvis Wo Contrast  11/11/2012  *RADIOLOGY REPORT*  Clinical Data: Distended abdomen with abdominal pain.  CT ABDOMEN AND PELVIS WITHOUT CONTRAST  Technique:  Multidetector CT imaging of the abdomen and pelvis was performed following the standard protocol without intravenous contrast.  Comparison: 12/10/2009  Findings: Small bilateral pleural effusions with atelectasis or infiltration in both lung bases.  Moderate sized pericardial effusion.  Cardiac enlargement.  Moderate sized esophageal hiatal hernia.  The unenhanced appearance of the liver, spleen, gallbladder, pancreas, adrenal glands, and retroperitoneal lymph nodes is unremarkable.  Mild calcification of the abdominal aorta without aneurysm.  Multiple parenchymal cysts in the right kidney corresponding to the previous study.  Largest is in the upper pole and measures 1.7 cm.  There is a hyper dense lesion in  the upper pole of the right kidney posteriorly measuring 7 mm consistent with a hemorrhagic cyst.  No hydronephrosis of the right kidney.  The left kidney demonstrates marked hydronephrosis and ureterectasis with dilated and tortuous aorta down to the level of the bladder. There is marked parenchymal atrophy in the left kidney suggesting that this represents a longstanding process.  The appearance is similar to the previous study, allowing for technical differences. The stomach, small bowel, and colon are not abnormally distended. No abdominal ascites.  Pelvis:  There is marked distension of the bladder  suggesting possible bladder outlet obstruction versus dysmotility.  The prostate gland is not appear to be enlarged.  The appearance is stable since the previous study.  No free or loculated pelvic fluid collections.  The rectum is decompressed but there is apparent thickening of the wall of the anorectal junction.  Was present previously.  This could represent chronic inflammatory process. Mass lesion can also have this appearance.  The appendix is normal. No diverticulitis.  Lumbar scoliosis convex towards the right.  Degenerative changes in the lumbar spine and hips.  IMPRESSION: Small bilateral pleural effusions with basilar atelectasis or infiltration.  Cardiac enlargement with pericardial effusion. Prominent pyelocaliectasis and ureterectasis of the left kidney with parenchymal atrophy.  Distended bladder without wall thickening.  Thickening of the wall of the anorectal junction. These findings are similar to previous study.  Clinical appearance of abdominal distension is likely to be due to the distended bladder.   Original Report Authenticated By: Burman Nieves, M.D.    Dg Chest 2 View  11/11/2012  *RADIOLOGY REPORT*  Clinical Data: Chest pain.  CHEST - 2 VIEW  Comparison: 10/23/2007 and prior chest radiographs  Findings: Cardiomegaly and peribronchial thickening again noted. Left basilar scarring/density is unchanged. There is no evidence of pleural effusion, pneumothorax or pulmonary edema. No acute bony abnormalities are identified.  IMPRESSION: Cardiomegaly without evidence of acute cardiopulmonary disease.   Original Report Authenticated By: Harmon Pier, M.D.    Ct Head Wo Contrast  11/12/2012  *RADIOLOGY REPORT*  Clinical Data: 67 year old male with right hemipareses.  History of dementia, prior infarct  CT HEAD WITHOUT CONTRAST  Technique:  Contiguous axial images were obtained from the base of the skull through the vertex without contrast.  Comparison: None  Findings: Left hemispheric  encephalomalacia noted. Chronic small vessel white matter ischemic changes are present. A 1.7 cm calcified area in the left periventricular region is likely chronic. No acute intracranial abnormalities are identified, including mass effect, hydrocephalus, extra-axial fluid collection, midline shift, hemorrhage, or acute infarction.  Left frontotemporal craniectomy noted.  IMPRESSION: No evidence of acute intracranial abnormality.  Left hemispheric encephalomalacia and chronic small vessel white matter ischemic changes.   Original Report Authenticated By: Harmon Pier, M.D.    Dg Chest Port 1 View  11/14/2012  *RADIOLOGY REPORT*  Clinical Data: Shortness of breath.  PORTABLE CHEST - 1 VIEW  Comparison: 11/10/2012  Findings: Very low lung volumes.  There is cardiomegaly with vascular congestion and bibasilar atelectasis.  Lung volumes have decreased.  No acute bony abnormality or visible effusions.  IMPRESSION: Very low lung volumes.  Cardiomegaly with vascular congestion and bibasilar atelectasis.   Original Report Authenticated By: Charlett Nose, M.D.    Dg Abd 2 Views  11/11/2012  *RADIOLOGY REPORT*  Clinical Data: Abdominal pain and vomiting.  ABDOMEN - 2 VIEW  Comparison: 01/15/2011 radiographs  Findings: Gas filled loops of colon and small bowel identified. No dilated loops of bowel are  present. There is no evidence of pneumoperitoneum. No suspicious calcifications are noted. A severe thoracolumbar scoliosis is again identified.  IMPRESSION: Nonspecific nonobstructive bowel gas pattern - no evidence of pneumoperitoneum.   Original Report Authenticated By: Harmon Pier, M.D.    Dg Abd Portable 1v  11/15/2012  *RADIOLOGY REPORT*  Clinical Data: Evaluate for free air.  PORTABLE ABDOMEN - 1 VIEW  Comparison: Supine abdominal image 11/15/2012.  Findings: Decubitus view demonstrates continued mild diffuse gaseous distention of bowel.  No free air on decubitus view.  IMPRESSION: No free air.   Original Report  Authenticated By: Charlett Nose, M.D.    Dg Abd Portable 1v  11/15/2012  *RADIOLOGY REPORT*  Clinical Data: Ileus  PORTABLE ABDOMEN - 1 VIEW  Comparison: 11/14/2012  Findings: Gas distended large and small bowel.  Findings may represent an ileus without significant change from prior examination. Distal obstructing lesion not excluded.  Bowel walls well visualized which may be related to approximation of gas distended loops.  Free air cannot be excluded.  Decubitus view may be considered.  Nasogastric tube may be in place projecting to the right of midline.  Significant scoliosis.  IMPRESSION: Persistent gas distended bowel which may reflect ileus.  Distal obstructing lesion not excluded.  Free air not excluded and decubitus view recommended for further delineation.  Critical Value/emergent results were called by telephone at the time of interpretation on 11/15/2012 at 8:05 a.m. to [facility] the patient's nurse , who verbally acknowledged these results.   Original Report Authenticated By: Lacy Duverney, M.D.    Dg Abd Portable 1v  11/14/2012  *RADIOLOGY REPORT*  Clinical Data: Ileus  PORTABLE ABDOMEN - 1 VIEW  Comparison: CT abdomen pelvis dated 11/11/2012  Findings: Gaseous distention of small and large bowel, suggesting diffuse adynamic ileus.  Thoracolumbar scoliosis.  IMPRESSION: Suspected diffuse adynamic ileus.   Original Report Authenticated By: Charline Bills, M.D.    Assessment/Plan:  Principal Problem:  Pericardial effusion  Active Problems:  Dementia  Stroke  GERD (gastroesophageal reflux disease)  Hematemesis/vomiting blood  UGIB (upper gastrointestinal bleed)  Hemiparesis affecting right side as late effect of stroke  Expressive aphasia  Hypertension, accelerated  67 year old with prior stroke, admitted with nausea vomiting, hematemesis, hemoglobin now stable, no further bleeding, EGD unremarkable with incidental finding of large pericardial effusion on abdominal CT scan with subsequent  echocardiogram demonstrating large circumferential pericardial effusion, hemodynamically stable, Cardiomyopathy, EF 35-40%, hypertension, left bundle branch block.  - Appreciate TCTS consultation for pericardial window. Cytology, tissue. Question underlying etiology. Sed rate 22, not very elevated, TSH 1.0-normal, rheumatoid factor less than 10. No recent cough, unexplained weight loss. Chest x-ray with cardiomegaly and no evidence of acute cardiopulmonary disease. Now s/p Pericardial drainage and window with 200-250cc of fluid removed. LDH elevated. Cultures and cytology pending.  - Now with NGT in place due to recurrent vomiting and distended abdomen and coffee ground emesis     Quintella Reichert, MD  11/15/2012  9:21 AM

## 2012-11-15 NOTE — Progress Notes (Signed)
   CARDIOTHORACIC SURGERY PROGRESS NOTE   R2 Days Post-Op Procedure(s) (LRB): SUBXYPHOID PERICARDIAL WINDOW (N/A)  Subjective: Denies SOB.  Coffee ground emesis last night, NG tube now in place.  Denies chest pain.  Some mild epigastric pain.  Objective: Vital signs: BP Readings from Last 1 Encounters:  11/15/12 131/76   Pulse Readings from Last 1 Encounters:  11/15/12 104   Resp Readings from Last 1 Encounters:  11/15/12 17   Temp Readings from Last 1 Encounters:  11/15/12 98.6 F (37 C)     Hemodynamics:    Physical Exam:  Rhythm:   Sinus tach  Breath sounds: Diminished at bases  Heart sounds:  Distant, RRR  Incisions:  Clean and dry  Abdomen:  Mildly distended but soft  Extremities:  warm   Intake/Output from previous day: 03/01 0701 - 03/02 0700 In: 940 [P.O.:340; I.V.:480; NG/GT:120] Out: 1341 [Urine:970; Emesis/NG output:351; Chest Tube:20] Intake/Output this shift: Total I/O In: 41.3 [I.V.:11.3; NG/GT:30] Out: 125 [Urine:125]  Lab Results:  Recent Labs  11/14/12 0501 11/15/12 0550  WBC 9.4 7.9  HGB 11.3* 10.0*  HCT 35.2* 30.6*  PLT 209 203   BMET:  Recent Labs  11/14/12 0501 11/15/12 0550  NA 142 143  K 4.3 4.1  CL 109 112  CO2 21 22  GLUCOSE 118* 117*  BUN 19 18  CREATININE 1.51* 1.41*  CALCIUM 8.6 8.3*    CBG (last 3)  No results found for this basename: GLUCAP,  in the last 72 hours ABG    Component Value Date/Time   HCO3 20.6 10/09/2007 1729   TCO2 21 10/09/2007 1729   ACIDBASEDEF 2.0 10/09/2007 1729   CXR: *RADIOLOGY REPORT*  Clinical Data: Shortness of breath.  PORTABLE CHEST - 1 VIEW  Comparison: 11/10/2012  Findings: Very low lung volumes. There is cardiomegaly with  vascular congestion and bibasilar atelectasis. Lung volumes have  decreased. No acute bony abnormality or visible effusions.  IMPRESSION:  Very low lung volumes. Cardiomegaly with vascular congestion and  bibasilar atelectasis.  Original Report  Authenticated By: Charlett Nose, M.D.   Assessment/Plan: S/P Procedure(s) (LRB): SUBXYPHOID PERICARDIAL WINDOW (N/A)  Stable from standpoint of pericardial window Plan per medical teams  OWEN,CLARENCE H 11/15/2012 10:14 AM

## 2012-11-16 ENCOUNTER — Inpatient Hospital Stay (HOSPITAL_COMMUNITY): Payer: Medicare Other

## 2012-11-16 ENCOUNTER — Encounter (HOSPITAL_COMMUNITY): Payer: Self-pay | Admitting: Cardiothoracic Surgery

## 2012-11-16 LAB — BODY FLUID CULTURE

## 2012-11-16 LAB — PATHOLOGIST SMEAR REVIEW

## 2012-11-16 MED ORDER — LACTULOSE 10 GM/15ML PO SOLN
20.0000 g | Freq: Two times a day (BID) | ORAL | Status: DC
  Administered 2012-11-16: 20 g via ORAL
  Filled 2012-11-16 (×3): qty 30

## 2012-11-16 NOTE — Progress Notes (Signed)
Patient Name: [name] Date of Encounter: 11/16/2012    SUBJECTIVE: No cardiac complaints. He denies dyspnea.  TELEMETRY:  Sinus tachycardia: Filed Vitals:   11/16/12 1100 11/16/12 1131 11/16/12 1200 11/16/12 1300  BP: 160/87  156/95 148/87  Pulse: 118  109 118  Temp:  98.3 F (36.8 C)    TempSrc:  Oral    Resp: 24  27   Weight:      SpO2: 100%  100% 100%    Intake/Output Summary (Last 24 hours) at 11/16/12 1347 Last data filed at 11/16/12 1300  Gross per 24 hour  Intake   1070 ml  Output   2445 ml  Net  -1375 ml    LABS: Basic Metabolic Panel:  Recent Labs  21/30/86 0501 11/15/12 0550  NA 142 143  K 4.3 4.1  CL 109 112  CO2 21 22  GLUCOSE 118* 117*  BUN 19 18  CREATININE 1.51* 1.41*  CALCIUM 8.6 8.3*   CBC:  Recent Labs  11/14/12 0501 11/15/12 0550  WBC 9.4 7.9  HGB 11.3* 10.0*  HCT 35.2* 30.6*  MCV 81.7 81.2  PLT 209 203    Radiology/Studies:  No new data  Physical Exam: Blood pressure 148/87, pulse 118, temperature 98.3 F (36.8 C), temperature source Oral, resp. rate 27, weight 92.4 kg (203 lb 11.3 oz), SpO2 100.00%. Weight change:    No obvious rub is heard.  Chest is clear anteriorly.  Expressive aphasia.  ASSESSMENT:  1. Pericardial disease, rule out malignant effusion  2. Sinus tachycardia  Plan:  1. Await cytology.  2. Monitor heart rate and rhythm  Signed, Lesleigh Noe 11/16/2012, 1:47 PM

## 2012-11-16 NOTE — Progress Notes (Signed)
Patient ID: [name]  male  ZOX:[id number]    DOB: Jan 22, 1946    DOA: 11/10/2012  PCP: No primary Aleiyah Halpin on file.  Assessment/Plan: Principal Problem:  Postop ileus: Abdominal distention improving but still put out 175cc from NG, had 2 BM's yesterday  - DC'd all oral meds, NPO, cont hydration, NGT suction, mobilize - cont lactulose, dulcolax supp - surgery following - KUB this am shows slight decrease in ileus pattern  Pericardial effusion: s/p pericardial window, pod#3 - ECHO :showed large free flowing pericardial effusion, no tamponade physiology, EF 35-40%, Possible hypokinesis of the mid-distalinferoseptal myocardium. - Appreciate CT surgery assistance, s/p Subxiphoid pericardial window and drainage of pericardial effusion. - Fluid cultures negative so far, tube out 3/1.   Hematemesis/vomiting blood, upper GI bleed: restarted with ileus - EGD done on 2/26 showed hiatal hernia, minimal antral gastritis, no bleeding source - CT abdomen and pelvis was done which showed small bilateral pleural effusions, cardiac enlargement with pericardial effusion - Continue PPI, H/H stable    Hypertension, accelerated with tachycardia: - Hold all antihypertensives, placed on scheduled IV Lopressor and PRN labetlaol - will place on clonidine patch if BP starts trending up    Dementia: cont namenda    GERD (gastroesophageal reflux disease): on PPI    Hemiparesis affecting right side as late effect of stroke,  Expressive aphasia: Appears to be from the prior stroke - CT head did not show any acute CVA, PT eval -> will return to SNF when medically clear   DVT Prophylaxis: SCDs  Code Status:  Disposition:   Subjective: Abdominal distention slightly better  - still hypoactive bowel sounds, 2 BM's yesterday  Objective: Weight change:   Intake/Output Summary (Last 24 hours) at 11/16/12 1100 Last data filed at 11/16/12 0700  Gross per 24 hour  Intake    920 ml  Output   2450 ml   Net  -1530 ml   Blood pressure 170/69, pulse 102, temperature 98.2 F (36.8 C), temperature source Oral, resp. rate 21, weight 92.4 kg (203 lb 11.3 oz), SpO2 99.00%.  Physical Exam: General: AXOX3, dysarthria, NGT suctioning CVS: S1-S2 clear Chest: CTAB, Abdomen: soft, hypoactive bowel sounds, distended  Extremities: no c/c/e bilaterally Neuro: right-sided weakness (from prior stroke), dysarthria   Lab Results: Basic Metabolic Panel:  Recent Labs Lab 11/14/12 0501 11/15/12 0550  NA 142 143  K 4.3 4.1  CL 109 112  CO2 21 22  GLUCOSE 118* 117*  BUN 19 18  CREATININE 1.51* 1.41*  CALCIUM 8.6 8.3*   Liver Function Tests:  Recent Labs Lab 11/11/12 1703  AST 17  ALT 10  ALKPHOS 46  BILITOT 0.2*  PROT 6.5  ALBUMIN 3.1*   CBC:  Recent Labs Lab 11/14/12 0501 11/15/12 0550  WBC 9.4 7.9  HGB 11.3* 10.0*  HCT 35.2* 30.6*  MCV 81.7 81.2  PLT 209 203   Cardiac Enzymes:  Recent Labs Lab 11/10/12 2342  TROPONINI <0.30     Micro Results: Recent Results (from the past 240 hour(s))  MRSA PCR SCREENING     Status: None   Collection Time    11/11/12  1:24 PM      Result Value Range Status   MRSA by PCR NEGATIVE  NEGATIVE Final   Comment:            The GeneXpert MRSA Assay (FDA     approved for NASAL specimens     only), is one component of a  comprehensive MRSA colonization     surveillance program. It is not     intended to diagnose MRSA     infection nor to guide or     monitor treatment for     MRSA infections.  AFB CULTURE WITH SMEAR     Status: None   Collection Time    11/13/12 12:00 PM      Result Value Range Status   Specimen Description FLUID PERICARDIAL   Final   Special Requests NONE   Final   ACID FAST SMEAR NO ACID FAST BACILLI SEEN   Final   Culture     Final   Value: CULTURE WILL BE EXAMINED FOR 6 WEEKS BEFORE ISSUING A FINAL REPORT   Report Status PENDING   Incomplete  FUNGUS CULTURE W SMEAR     Status: None   Collection Time     11/13/12 12:00 PM      Result Value Range Status   Specimen Description FLUID PERICARDIAL   Final   Special Requests NONE   Final   Fungal Smear NO YEAST OR FUNGAL ELEMENTS SEEN   Final   Culture CULTURE IN PROGRESS FOR FOUR WEEKS   Final   Report Status PENDING   Incomplete  BODY FLUID CULTURE     Status: None   Collection Time    11/13/12 12:00 PM      Result Value Range Status   Specimen Description FLUID PERICARDIAL   Final   Special Requests NONE   Final   Gram Stain     Final   Value: NO WBC SEEN     NO ORGANISMS SEEN   Culture NO GROWTH 2 DAYS   Final   Report Status PENDING   Incomplete    Studies/Results: Dg Chest 2 View  11/11/2012  *RADIOLOGY REPORT*  Clinical Data: Chest pain.  CHEST - 2 VIEW  Comparison: 10/23/2007 and prior chest radiographs  Findings: Cardiomegaly and peribronchial thickening again noted. Left basilar scarring/density is unchanged. There is no evidence of pleural effusion, pneumothorax or pulmonary edema. No acute bony abnormalities are identified.  IMPRESSION: Cardiomegaly without evidence of acute cardiopulmonary disease.   Original Report Authenticated By: Harmon Pier, M.D.    Dg Abd 2 Views  11/11/2012  *RADIOLOGY REPORT*  Clinical Data: Abdominal pain and vomiting.  ABDOMEN - 2 VIEW  Comparison: 01/15/2011 radiographs  Findings: Gas filled loops of colon and small bowel identified. No dilated loops of bowel are present. There is no evidence of pneumoperitoneum. No suspicious calcifications are noted. A severe thoracolumbar scoliosis is again identified.  IMPRESSION: Nonspecific nonobstructive bowel gas pattern - no evidence of pneumoperitoneum.   Original Report Authenticated By: Harmon Pier, M.D.     Medications: Scheduled Meds: . antiseptic oral rinse  15 mL Mouth Rinse q12n4p  . bisacodyl  10 mg Rectal Daily  . chlorhexidine  15 mL Mouth Rinse BID  . citalopram  5 mg Oral Daily  . lactulose  20 g Oral TID  . memantine  10 mg Oral BID  .  metoprolol  5 mg Intravenous Q6H  . pantoprazole (PROTONIX) IV  40 mg Intravenous Q12H      LOS: 6 days   RAI,RIPUDEEP M.D. [facility] 11/16/2012, 11:00 AM Pager: [phone]  If 7PM-7AM, please contact night-coverage [email or web address] Password [email or web address]

## 2012-11-16 NOTE — Op Note (Signed)
noted 

## 2012-11-16 NOTE — Progress Notes (Signed)
Patient interviewed and examined, agree with PA note above.  Nurse reports two BMs yesterday, none today. Abdomen non tender, x-rays with slight improvement.  Appears to be making slow progress  Mariella Saa MD, FACS  11/16/2012 11:21 AM

## 2012-11-16 NOTE — Clinical Social Work Note (Signed)
[facility] CSW will sign off, and [address] [facility] CSW, Theresia Lo will continue to follow for discharge planning needs.   Rozetta Nunnery MSW, [employer] [phone]

## 2012-11-16 NOTE — Progress Notes (Signed)
Patient ID: [name], male   DOB: December 23, 1945, 67 y.o.   MRN: [id number] 3 Days Post-Op  Subjective: Pt not passing flatus or having BMs.  No nausea with NGT in place.  Objective: Vital signs in last 24 hours: Temp:  [98 F (36.7 C)-100.3 F (37.9 C)] 98.2 F (36.8 C) (03/03 0749) Pulse Rate:  [97-119] 102 (03/03 0700) Resp:  [11-34] 21 (03/03 0700) BP: (129-174)/(63-94) 170/69 mmHg (03/03 0700) SpO2:  [96 %-100 %] 99 % (03/03 0700) Last BM Date: 11/15/12  Intake/Output from previous day: 03/02 0701 - 03/03 0700 In: 1201.3 [I.V.:941.3; NG/GT:260] Out: 2775 [Urine:1425; Emesis/NG output:1350] Intake/Output this shift:    PE: Abd: soft, hypoactive BS, NT, NGT with bilious output Ht: tachy Lungs: CTAB  Lab Results:   Recent Labs  11/14/12 0501 11/15/12 0550  WBC 9.4 7.9  HGB 11.3* 10.0*  HCT 35.2* 30.6*  PLT 209 203   BMET  Recent Labs  11/14/12 0501 11/15/12 0550  NA 142 143  K 4.3 4.1  CL 109 112  CO2 21 22  GLUCOSE 118* 117*  BUN 19 18  CREATININE 1.51* 1.41*  CALCIUM 8.6 8.3*   PT/INR No results found for this basename: LABPROT, INR,  in the last 72 hours CMP     Component Value Date/Time   NA 143 11/15/2012 0550   K 4.1 11/15/2012 0550   CL 112 11/15/2012 0550   CO2 22 11/15/2012 0550   GLUCOSE 117* 11/15/2012 0550   BUN 18 11/15/2012 0550   CREATININE 1.41* 11/15/2012 0550   CALCIUM 8.3* 11/15/2012 0550   PROT 6.5 11/11/2012 1703   ALBUMIN 3.1* 11/11/2012 1703   AST 17 11/11/2012 1703   ALT 10 11/11/2012 1703   ALKPHOS 46 11/11/2012 1703   BILITOT 0.2* 11/11/2012 1703   GFRNONAA 50* 11/15/2012 0550   GFRAA 58* 11/15/2012 0550   Lipase     Component Value Date/Time   LIPASE 25 01/09/2010 1522       Studies/Results: Dg Abd 1 View  11/16/2012  *RADIOLOGY REPORT*  Clinical Data: With ileus follow up  ABDOMEN - 1 VIEW  Comparison: Prior abdominal radiographs 11/15/2012  Findings: Single frontal view the abdomen demonstrates a nasogastric tube with the  tip in the prepyloric gastric antrum versus proximal duodenum. Slightly decreased gaseous distension of multiple loops of bowel in the left hemiabdomen.  No large free air identified.  Stable advanced dextroconvex scoliosis of the thoracolumbar junction with multilevel degenerative changes throughout the spine.  IMPRESSION:  1.  Slight interval decrease in the underlying ileus pattern. 2.  The tip of nasogastric tube is in the prepyloric antrum or proximal duodenum.   Original Report Authenticated By: Malachy Moan, M.D.    Dg Chest Port 1 View  11/14/2012  *RADIOLOGY REPORT*  Clinical Data: Shortness of breath.  PORTABLE CHEST - 1 VIEW  Comparison: 11/10/2012  Findings: Very low lung volumes.  There is cardiomegaly with vascular congestion and bibasilar atelectasis.  Lung volumes have decreased.  No acute bony abnormality or visible effusions.  IMPRESSION: Very low lung volumes.  Cardiomegaly with vascular congestion and bibasilar atelectasis.   Original Report Authenticated By: Charlett Nose, M.D.    Dg Abd Portable 1v  11/15/2012  *RADIOLOGY REPORT*  Clinical Data: Evaluate for free air.  PORTABLE ABDOMEN - 1 VIEW  Comparison: Supine abdominal image 11/15/2012.  Findings: Decubitus view demonstrates continued mild diffuse gaseous distention of bowel.  No free air on decubitus view.  IMPRESSION: No free  air.   Original Report Authenticated By: Charlett Nose, M.D.    Dg Abd Portable 1v  11/15/2012  *RADIOLOGY REPORT*  Clinical Data: Ileus  PORTABLE ABDOMEN - 1 VIEW  Comparison: 11/14/2012  Findings: Gas distended large and small bowel.  Findings may represent an ileus without significant change from prior examination. Distal obstructing lesion not excluded.  Bowel walls well visualized which may be related to approximation of gas distended loops.  Free air cannot be excluded.  Decubitus view may be considered.  Nasogastric tube may be in place projecting to the right of midline.  Significant scoliosis.   IMPRESSION: Persistent gas distended bowel which may reflect ileus.  Distal obstructing lesion not excluded.  Free air not excluded and decubitus view recommended for further delineation.  Critical Value/emergent results were called by telephone at the time of interpretation on 11/15/2012 at 8:05 a.m. to [facility] the patient's nurse , who verbally acknowledged these results.   Original Report Authenticated By: Lacy Duverney, M.D.    Dg Abd Portable 1v  11/14/2012  *RADIOLOGY REPORT*  Clinical Data: Ileus  PORTABLE ABDOMEN - 1 VIEW  Comparison: CT abdomen pelvis dated 11/11/2012  Findings: Gaseous distention of small and large bowel, suggesting diffuse adynamic ileus.  Thoracolumbar scoliosis.  IMPRESSION: Suspected diffuse adynamic ileus.   Original Report Authenticated By: Charline Bills, M.D.     Anti-infectives: Anti-infectives   Start     Dose/Rate Route Frequency Ordered Stop   11/13/12 1100  cefUROXime (ZINACEF) 1.5 g in dextrose 5 % 50 mL IVPB     1.5 g 100 mL/hr over 30 Minutes Intravenous 60 min pre-op 11/13/12 1008 11/13/12 1115   11/13/12 1053  cefUROXime (ZINACEF) 1.5 G injection    Comments:  [name]: cabinet override      11/13/12 1053 11/13/12 2259       Assessment/Plan  1. Post op ileus, s/p pericardial window  Plan: 1. Cont NGT and NPO until bowel function returns.   LOS: 6 days    [name] 11/16/2012, 9:49 AM Pager: [phone]

## 2012-11-16 NOTE — Progress Notes (Signed)
Utilization Review Completed. 11/16/2012  

## 2012-11-17 ENCOUNTER — Inpatient Hospital Stay (HOSPITAL_COMMUNITY): Payer: Medicare Other

## 2012-11-17 LAB — CBC
HCT: 36 % — ABNORMAL LOW (ref 39.0–52.0)
Hemoglobin: 11.7 g/dL — ABNORMAL LOW (ref 13.0–17.0)
MCH: 26.2 pg (ref 26.0–34.0)
MCHC: 32.5 g/dL (ref 30.0–36.0)
MCV: 80.7 fL (ref 78.0–100.0)
RBC: 4.46 MIL/uL (ref 4.22–5.81)

## 2012-11-17 LAB — BASIC METABOLIC PANEL
BUN: 12 mg/dL (ref 6–23)
CO2: 23 mEq/L (ref 19–32)
Calcium: 9.3 mg/dL (ref 8.4–10.5)
Creatinine, Ser: 1.32 mg/dL (ref 0.50–1.35)
GFR calc non Af Amer: 54 mL/min — ABNORMAL LOW (ref >90)
Glucose, Bld: 121 mg/dL — ABNORMAL HIGH (ref 70–99)

## 2012-11-17 MED ORDER — KCL IN DEXTROSE-NACL 20-5-0.45 MEQ/L-%-% IV SOLN
INTRAVENOUS | Status: DC
  Administered 2012-11-17 – 2012-11-18 (×2): via INTRAVENOUS
  Filled 2012-11-17 (×3): qty 1000

## 2012-11-17 MED ORDER — BLISTEX EX OINT
1.0000 "application " | TOPICAL_OINTMENT | Freq: Two times a day (BID) | CUTANEOUS | Status: DC
  Administered 2012-11-17 – 2012-11-20 (×7): 1 via TOPICAL
  Filled 2012-11-17: qty 10

## 2012-11-17 MED ORDER — LIP MEDEX EX OINT: 1.0000 "application " | TOPICAL_OINTMENT | Freq: Two times a day (BID) | CUTANEOUS | Status: DC

## 2012-11-17 MED ORDER — METOPROLOL TARTRATE 1 MG/ML IV SOLN
10.0000 mg | Freq: Four times a day (QID) | INTRAVENOUS | Status: DC
  Administered 2012-11-17 – 2012-11-18 (×5): 10 mg via INTRAVENOUS
  Filled 2012-11-17: qty 5
  Filled 2012-11-17 (×5): qty 10

## 2012-11-17 MED ORDER — FUROSEMIDE 10 MG/ML IJ SOLN
INTRAMUSCULAR | Status: AC
  Filled 2012-11-17: qty 2

## 2012-11-17 MED ORDER — ALUM & MAG HYDROXIDE-SIMETH 200-200-20 MG/5ML PO SUSP: 30.0000 mL | Freq: Four times a day (QID) | ORAL | Status: DC | PRN

## 2012-11-17 MED ORDER — MENTHOL 3 MG MT LOZG: 1.0000 | LOZENGE | OROMUCOSAL | Status: DC | PRN

## 2012-11-17 MED ORDER — PHENOL 1.4 % MT LIQD: 2.0000 | OROMUCOSAL | Status: DC | PRN

## 2012-11-17 MED ORDER — MAGIC MOUTHWASH
15.0000 mL | Freq: Four times a day (QID) | ORAL | Status: DC | PRN
  Filled 2012-11-17: qty 15

## 2012-11-17 NOTE — Progress Notes (Signed)
Subjective:  No complaints, wants water. No CP. No SOB Objective:  Vital Signs in the last 24 hours: Temp:  [98.3 F (36.8 C)-99.5 F (37.5 C)] 98.3 F (36.8 C) (03/04 0803) Pulse Rate:  [101-130] 111 (03/04 0800) Resp:  [17-28] 18 (03/04 0800) BP: (132-178)/(69-106) 164/82 mmHg (03/04 0800) SpO2:  [99 %-100 %] 100 % (03/04 0800) Weight:  [92.6 kg (204 lb 2.3 oz)] 92.6 kg (204 lb 2.3 oz) (03/04 0600)  Intake/Output from previous day: 03/03 0701 - 03/04 0700 In: 960 [I.V.:960] Out: 1925 [Urine:725; Emesis/NG output:1200]   Physical Exam: General: NGT in place Head:  Normocephalic and atraumatic. Lungs: Clear to auscultation and percussion. Heart: Tachy RR, no rubs .  Abdomen: distended. Extremities: No clubbing or cyanosis. No edema. Neurologic: Alert and oriented x 3. Aphasia    Lab Results:  Recent Labs  11/15/12 0550 11/17/12 0915  WBC 7.9 7.1  HGB 10.0* 11.7*  PLT 203 268    Recent Labs  11/15/12 0550  NA 143  K 4.1  CL 112  CO2 22  GLUCOSE 117*  BUN 18  CREATININE 1.41*    Imaging: Dg Abd 1 View  11/16/2012  *RADIOLOGY REPORT*  Clinical Data: With ileus follow up  ABDOMEN - 1 VIEW  Comparison: Prior abdominal radiographs 11/15/2012  Findings: Single frontal view the abdomen demonstrates a nasogastric tube with the tip in the prepyloric gastric antrum versus proximal duodenum. Slightly decreased gaseous distension of multiple loops of bowel in the left hemiabdomen.  No large free air identified.  Stable advanced dextroconvex scoliosis of the thoracolumbar junction with multilevel degenerative changes throughout the spine.  IMPRESSION:  1.  Slight interval decrease in the underlying ileus pattern. 2.  The tip of nasogastric tube is in the prepyloric antrum or proximal duodenum.   Original Report Authenticated By: Malachy Moan, M.D.      Telemetry: sinus tach 122.  Personally viewed.    Assessment/Plan:  Principal Problem:   Ileus,  postoperative Active Problems:   Dementia   Stroke   GERD (gastroesophageal reflux disease)   Hematemesis/vomiting blood   UGIB (upper gastrointestinal bleed)   Hemiparesis affecting right side as late effect of stroke   Expressive aphasia   Hypertension, accelerated   Pericardial effusion  Pericardial effusion - drain out. Await cytology. Tachycardia - Increased metoprolol to 10mg  IV Q6. Cardiomyopathy - likely HTN. Could be CAD. If no improvement with meds, will need cath in future.    SKAINS, MARK 11/17/2012, 9:54 AM

## 2012-11-17 NOTE — Progress Notes (Signed)
Patient ID: [name]  male  WGN:[id number]    DOB: [birth year]    DOA: 11/10/2012  PCP: No primary provider on file.  Brief Interim summary HPI: 67 yo male h/o cva with residual exp aphasia and rt upper ext weakness comes in with several episodes of n/v and coffee ground emesis for over 24 hours. He denies any fevers. He has mild expressive aphasia. No diarrhea. Some epigastric abd pain. On asa. No cp no sob. Sent from snf for further evaluation.  GI was consulted (Dr Bosie Clos) and patient underwent EGD which showed hiatal hernia with minimal antral gastritis but no bleeding source. Patient underwent CT abdomen and pelvis which was negative for any acute intra-abdominal pathology however did show large pericardial effusion. 2-D echo confirmed large free-flowing pericardial effusion with no temp no physiology likely chronic with possible hypokinesis of the mid to distal inferoseptal myocardium. Cardiology has been following the patient, patient underwent a pericardial window on 11/13/12, pericardial fluid cultures has been negative so far. Drain was removed on 3/1. Subsequently patient has developed a postop ileus. He is currently n.p.o., on NGT suction, [facility] has been following the patient. - Transfer the patient to telemetry floor today    Consults: GI: Dr Bosie Clos, signed off Cardiothoracic: Dr Cornelius Moras Cardiology: Dr Anne Fu [facility]CS- Dr Michaell Cowing     Assessment/Plan: Principal Problem:  Postop ileus: Abdominal distention improving, large BM per RN   - DC'd all oral meds, NPO, cont hydration, NGT suction, mobilize - cont lactulose, dulcolax supp - surgery following, ordered KUB this AM, pending   Pericardial effusion: s/p pericardial window on 2/28,  - ECHO :showed large free flowing pericardial effusion, no tamponade physiology, EF 35-40%, Possible hypokinesis of the mid-distalinferoseptal myocardium. - Appreciate CT surgery assistance, s/p Subxiphoid pericardial window and drainage of  pericardial effusion. - Fluid cultures negative so far, tube out 3/1.   Hematemesis/vomiting blood, upper GI bleed: restarted with ileus but improved now - EGD done on 2/26 showed hiatal hernia, minimal antral gastritis, no bleeding source. GI has signed off.  - CT abdomen and pelvis was done which showed small bilateral pleural effusions, cardiac enlargement with pericardial effusion - Continue PPI, H/H stable    Hypertension, accelerated with tachycardia: - Hold all antihypertensives, on scheduled IV Lopressor, increased today to 10mg q6 and PRN labetlaol - Cardiology following, may need further ischemia workup    Dementia: cont namenda    GERD (gastroesophageal reflux disease): on PPI    Hemiparesis affecting right side as late effect of stroke,  Expressive aphasia: Appears to be from the prior stroke - CT head did not show any acute CVA, PT eval -> will return to SNF when medically clear   DVT Prophylaxis: SCDs  Code Status:  Disposition: Will transfer to telemetry floor today  Subjective: Abdominal distention improving, large bowel movement today  Objective: Weight change:   Intake/Output Summary (Last 24 hours) at 11/17/12 1031 Last data filed at 11/17/12 1000  Gross per 24 hour  Intake   1200 ml  Output   2250 ml  Net  -1050 ml   Blood pressure 176/90, pulse 114, temperature 98.3 F (36.8 C), temperature source Oral, resp. rate 25, weight 92.6 kg (204 lb 2.3 oz), SpO2 100.00%.  Physical Exam: General: AxOx3 dysarthria, NGT suctioning, sitting up CVS: S1-S2 clear Chest: CTAB, Abdomen: soft, BS+, distension improved Extremities: no c/c/e bilaterally Neuro: right-sided weakness (from prior stroke), dysarthria   Lab Results: Basic Metabolic Panel:  Recent Labs Lab 11/15/12 0550  11/17/12 0915  NA 143 147*  K 4.1 3.4*  CL 112 111  CO2 22 23  GLUCOSE 117* 121*  BUN 18 12  CREATININE 1.41* 1.32  CALCIUM 8.3* 9.3   Liver Function Tests:  Recent  Labs Lab 11/11/12 1703  AST 17  ALT 10  ALKPHOS 46  BILITOT 0.2*  PROT 6.5  ALBUMIN 3.1*   CBC:  Recent Labs Lab 11/15/12 0550 11/17/12 0915  WBC 7.9 7.1  HGB 10.0* 11.7*  HCT 30.6* 36.0*  MCV 81.2 80.7  PLT 203 268   Cardiac Enzymes:  Recent Labs Lab 11/10/12 2342  TROPONINI <0.30     Micro Results: Recent Results (from the past 240 hour(s))  MRSA PCR SCREENING     Status: None   Collection Time    11/11/12  1:24 PM      Result Value Range Status   MRSA by PCR NEGATIVE  NEGATIVE Final   Comment:            The GeneXpert MRSA Assay (FDA     approved for NASAL specimens     only), is one component of a     comprehensive MRSA colonization     surveillance program. It is not     intended to diagnose MRSA     infection nor to guide or     monitor treatment for     MRSA infections.  AFB CULTURE WITH SMEAR     Status: None   Collection Time    11/13/12 12:00 PM      Result Value Range Status   Specimen Description FLUID PERICARDIAL   Final   Special Requests NONE   Final   ACID FAST SMEAR NO ACID FAST BACILLI SEEN   Final   Culture     Final   Value: CULTURE WILL BE EXAMINED FOR 6 WEEKS BEFORE ISSUING A FINAL REPORT   Report Status PENDING   Incomplete  FUNGUS CULTURE W SMEAR     Status: None   Collection Time    11/13/12 12:00 PM      Result Value Range Status   Specimen Description FLUID PERICARDIAL   Final   Special Requests NONE   Final   Fungal Smear NO YEAST OR FUNGAL ELEMENTS SEEN   Final   Culture CULTURE IN PROGRESS FOR FOUR WEEKS   Final   Report Status PENDING   Incomplete  BODY FLUID CULTURE     Status: None   Collection Time    11/13/12 12:00 PM      Result Value Range Status   Specimen Description FLUID PERICARDIAL   Final   Special Requests NONE   Final   Gram Stain     Final   Value: NO WBC SEEN     NO ORGANISMS SEEN   Culture NO GROWTH 3 DAYS   Final   Report Status 11/16/2012 FINAL   Final    Studies/Results: Dg Chest 2  View  11/11/2012  *RADIOLOGY REPORT*  Clinical Data: Chest pain.  CHEST - 2 VIEW  Comparison: 10/23/2007 and prior chest radiographs  Findings: Cardiomegaly and peribronchial thickening again noted. Left basilar scarring/density is unchanged. There is no evidence of pleural effusion, pneumothorax or pulmonary edema. No acute bony abnormalities are identified.  IMPRESSION: Cardiomegaly without evidence of acute cardiopulmonary disease.   Original Report Authenticated By: Harmon Pier, M.D.    Dg Abd 2 Views  11/11/2012  *RADIOLOGY REPORT*  Clinical Data: Abdominal pain and vomiting.  ABDOMEN - 2 VIEW  Comparison: 01/15/2011 radiographs  Findings: Gas filled loops of colon and small bowel identified. No dilated loops of bowel are present. There is no evidence of pneumoperitoneum. No suspicious calcifications are noted. A severe thoracolumbar scoliosis is again identified.  IMPRESSION: Nonspecific nonobstructive bowel gas pattern - no evidence of pneumoperitoneum.   Original Report Authenticated By: Harmon Pier, M.D.     Medications: Scheduled Meds: . antiseptic oral rinse  15 mL Mouth Rinse q12n4p  . bisacodyl  10 mg Rectal Daily  . chlorhexidine  15 mL Mouth Rinse BID  . lip balm  1 application Topical BID  . metoprolol  10 mg Intravenous Q6H  . pantoprazole (PROTONIX) IV  40 mg Intravenous Q12H      LOS: 7 days   RAI,RIPUDEEP M.D. [facility] 11/17/2012, 10:31 AM Pager: [phone]  If 7PM-7AM, please contact night-coverage [email or web address] Password [email or web address]

## 2012-11-17 NOTE — Progress Notes (Addendum)
[name] [id number] 04-Feb-1946   Subjective  Large BM per Rn - not in system yet No other events   Objective:  Vital signs:  Filed Vitals:   11/17/12 0400 11/17/12 0500 11/17/12 0600 11/17/12 0700  BP: 137/94 141/83 155/69 163/81  Pulse: 103 113 115 106  Temp: 98.6 F (37 C)     TempSrc: Oral     Resp: 17 19 22 23   Weight:   204 lb 2.3 oz (92.6 kg)   SpO2: 100% 100% 99% 100%    Last BM Date: 11/16/12  Intake/Output   Yesterday:  03/03 0701 - 03/04 0700 In: 960 [I.V.:960] Out: 1925 [Urine:725; Emesis/NG output:1200] - thin bilious.  NO PO  This shift:     Bowel function:  Flatus: n  BM: y  Physical Exam:  General: Pt awake/alert/oriented x4 in no acute distress Eyes: PERRL, normal EOM.  Sclera clear.  No icterus Neuro: CN II-XII intact w/o focal sensory/motor deficits. Lymph: No head/neck/groin lymphadenopathy Psych:  No delerium/psychosis/paranoia.  Mild dementia stable HENT: Normocephalic, Mucus membranes moist.  No thrush Neck: Supple, No tracheal deviation Chest: +chest wall pain at incision w good excursion CV:  Pulses intact.  Regular rhythm MS: Normal AROM mjr joints.  No obvious deformity Abdomen: Soft.  Nondistended.  Nontender.  No incarcerated hernias. Ext:  SCDs BLE.  No mjr edema.  No cyanosis Skin: No petechiae / purpurae  Problem List:  Principal Problem:   Ileus, postoperative Active Problems:   Dementia   Stroke   GERD (gastroesophageal reflux disease)   Hematemesis/vomiting blood   UGIB (upper gastrointestinal bleed)   Hemiparesis affecting right side as late effect of stroke   Expressive aphasia   Hypertension, accelerated   Pericardial effusion   Assessment  [name]  67 y.o. male  4 Days Post-Op  Procedure(s): SUBXYPHOID PERICARDIAL WINDOW  Ileus ?resolving  Plan:  -enema -Xray -ARF resolviong -NGT - hold on clamping trials w high output - prob tomorrow. -no oral laxatives!  PR only -VTE  prophylaxis- SCDs, etc -mobilize as tolerated to help recovery -?transfer to tele/floor to help w mobility  The patient is stable.  There is no evidence of peritonitis, acute abdomen, nor shock.  There is no strong evidence of failure of improvement nor decline with current non-operative management.  There is no need for surgery at the present moment.  We will continue to follow.   Ardeth Sportsman, M.D., F.A.C.S. [facility]astrointestinal [facility] [address], [address] [place], [place] [address] [phone] Main / Paging [phone] Voice Mail   11/17/2012  CARE TEAM:  PCP: No primary provider on file.  Outpatient Care Team: Patient has no care team.  Inpatient Treatment Team: Treatment Team: Attending Provider: Cathren Harsh, MD; Rounding Team: Loel Ro, MD; Consulting Physician: [name], MD; Consulting Physician: Donato Schultz, MD; Consulting Physician: Delight Ovens, MD; Technician: Florene Route, NT; Consulting Physician: Bishop Limbo, MD; Registered Nurse: Elvin So, RN; Physical Therapist: Barb Merino, PT; Occupational Therapist: Lorinda Creed Ward, OT   Results:   Labs: No results found for this or any previous visit (from the past 48 hour(s)).  Imaging / Studies: Dg Abd 1 View  11/16/2012  *RADIOLOGY REPORT*  Clinical Data: With ileus follow up  ABDOMEN - 1 VIEW  Comparison: Prior abdominal radiographs 11/15/2012  Findings: Single frontal view the abdomen demonstrates a nasogastric tube with the tip in the prepyloric gastric  antrum versus proximal duodenum. Slightly decreased gaseous distension of multiple loops of bowel in the left hemiabdomen.  No large free air identified.  Stable advanced dextroconvex scoliosis of the thoracolumbar junction with multilevel degenerative changes throughout the spine.  IMPRESSION:  1.  Slight interval decrease in the underlying ileus pattern. 2.  The tip of nasogastric  tube is in the prepyloric antrum or proximal duodenum.   Original Report Authenticated By: Malachy Moan, M.D.    Dg Abd Portable 1v  11/15/2012  *RADIOLOGY REPORT*  Clinical Data: Evaluate for free air.  PORTABLE ABDOMEN - 1 VIEW  Comparison: Supine abdominal image 11/15/2012.  Findings: Decubitus view demonstrates continued mild diffuse gaseous distention of bowel.  No free air on decubitus view.  IMPRESSION: No free air.   Original Report Authenticated By: Charlett Nose, M.D.     Medications / Allergies: per chart  Antibiotics: Anti-infectives   Start     Dose/Rate Route Frequency Ordered Stop   11/13/12 1100  cefUROXime (ZINACEF) 1.5 g in dextrose 5 % 50 mL IVPB     1.5 g 100 mL/hr over 30 Minutes Intravenous 60 min pre-op 11/13/12 1008 11/13/12 1115   11/13/12 1053  cefUROXime (ZINACEF) 1.5 G injection    Comments:  Honor Loh B: cabinet override      11/13/12 1053 11/13/12 2259

## 2012-11-17 NOTE — Progress Notes (Signed)
OT Cancellation Note  Patient Details Name: [name] MRN: [id number] DOB: [birth year]   Cancelled Treatment:    Reason Eval/Treat Not Completed: Other (comment) Pt from SNF and will return to SNF when medically ready. Will defer all OT needs to SNF. OT signing off.  [facility] Ward, OTR/L  [phone] 11/17/2012 11/17/2012, 10:54 AM

## 2012-11-17 NOTE — Progress Notes (Signed)
Physical Therapy Treatment Patient Details Name: [name] MRN: [id number] DOB: Mar 23, 1946 Today's Date: 11/17/2012 Time: 7829-5621 PT Time Calculation (min): 44 min  PT Assessment / Plan / Recommendation Comments on Treatment Session  pt happy with getting up today.  Enthusiastic about getting on his feet.  Emphasis on w/shifting and standing tolerance, but took a few steps.  Also work on normalizing tone and getting R wrist positioned.    Follow Up Recommendations  SNF     Does the patient have the potential to tolerate intense rehabilitation     Barriers to Discharge        Equipment Recommendations  None recommended by PT    Recommendations for Other Services    Frequency Min 2X/week   Plan Discharge plan remains appropriate;Frequency remains appropriate    Precautions / Restrictions Precautions Precautions: Fall Precaution Comments: R HP from prior CVA's, R UE may need splinting. Restrictions Weight Bearing Restrictions: No   Pertinent Vitals/Pain     Mobility  Bed Mobility Bed Mobility: Supine to Sit;Sitting - Scoot to Edge of Bed;Sit to Supine Supine to Sit: 3: Mod assist;HOB flat Sitting - Scoot to Edge of Bed: 3: Mod assist Sit to Supine: 4: Min assist;HOB flat Details for Bed Mobility Assistance: vc's for technique, demo. and tc with mod assist to scoot to EOB--Left side comes forward only Transfers Transfers: Sit to Stand;Stand to Sit Sit to Stand: 1: +2 Total assist;With upper extremity assist;From bed Sit to Stand: Patient Percentage: 50% Stand to Sit: 1: +2 Total assist;To bed Stand to Sit: Patient Percentage: 40% Details for Transfer Assistance: vc's for set up and hand placement; lifting assist, help coming forward Ambulation/Gait Ambulation/Gait Assistance: 1: +2 Total assist Ambulation/Gait: Patient Percentage: 40% Ambulation Distance (Feet): 6 Feet (forward and 3 ft back to bed) Assistive device: Other (Comment) (rolled chair  forward) Ambulation/Gait Assistance Details: R HP gait with need for w/shift assist; degraded gait as he progress and walked backward Gait Pattern: Step-to pattern (HP gait) Stairs: No Wheelchair Mobility Wheelchair Mobility: No    Exercises     PT Diagnosis:    PT Problem List:   PT Treatment Interventions:     PT Goals Acute Rehab PT Goals Time For Goal Achievement: 11/27/12 Potential to Achieve Goals: Fair Pt will go Supine/Side to Sit: with min assist;with HOB 0 degrees PT Goal: Supine/Side to Sit - Progress: Progressing toward goal Pt will go Sit to Stand: with mod assist PT Goal: Sit to Stand - Progress: Progressing toward goal Pt will go Stand to Sit: with mod assist PT Goal: Stand to Sit - Progress: Progressing toward goal Pt will Transfer Bed to Chair/Chair to Bed: with mod assist PT Transfer Goal: Bed to Chair/Chair to Bed - Progress: Progressing toward goal Pt will Ambulate: 1 - 15 feet;with mod assist;with least restrictive assistive device PT Goal: Ambulate - Progress: Progressing toward goal  Visit Information  Last PT Received On: 11/17/12 Assistance Needed: +2    Subjective Data  Subjective: "I walk every day"   Cognition  Cognition Overall Cognitive Status: Difficult to assess Difficult to assess due to: Impaired communication Arousal/Alertness: Awake/alert Behavior During Session: [facility] for tasks performed    Balance  Balance Balance Assessed: Yes Static Sitting Balance Static Sitting - Balance Support: Feet supported;No upper extremity supported Static Sitting - Level of Assistance: 5: Stand by assistance Static Standing Balance Static Standing - Balance Support: Left upper extremity supported;During functional activity Static Standing - Level of Assistance: 3:  Mod assist  End of Session PT - End of Session Equipment Utilized During Treatment: Gait belt Activity Tolerance: Patient tolerated treatment well Patient left: in bed;in CPM;with  family/visitor present;Other (comment) (pt going to another floor) Nurse Communication: Mobility status   GP     Deangela Randleman, Eliseo Gum 11/17/2012, 4:20 PM  11/17/2012  [place] Bing, PT [phone] (pager)

## 2012-11-18 LAB — MAGNESIUM: Magnesium: 1.5 mg/dL (ref 1.5–2.5)

## 2012-11-18 MED ORDER — POTASSIUM CHLORIDE CRYS ER 20 MEQ PO TBCR
40.0000 meq | EXTENDED_RELEASE_TABLET | Freq: Every day | ORAL | Status: DC
  Administered 2012-11-18 – 2012-11-20 (×3): 40 meq via ORAL
  Filled 2012-11-18 (×3): qty 2
  Filled 2012-11-18: qty 1

## 2012-11-18 MED ORDER — TAMSULOSIN HCL 0.4 MG PO CAPS
0.4000 mg | ORAL_CAPSULE | Freq: Every day | ORAL | Status: DC
  Administered 2012-11-18 – 2012-11-20 (×3): 0.4 mg via ORAL
  Filled 2012-11-18 (×3): qty 1

## 2012-11-18 MED ORDER — LABETALOL HCL 300 MG PO TABS
300.0000 mg | ORAL_TABLET | Freq: Two times a day (BID) | ORAL | Status: DC
  Administered 2012-11-18: 300 mg via ORAL
  Filled 2012-11-18 (×3): qty 1

## 2012-11-18 MED ORDER — CITALOPRAM HYDROBROMIDE 10 MG PO TABS
5.0000 mg | ORAL_TABLET | Freq: Every day | ORAL | Status: DC
  Administered 2012-11-18 – 2012-11-20 (×3): 5 mg via ORAL
  Filled 2012-11-18 (×3): qty 1

## 2012-11-18 MED ORDER — AMLODIPINE BESYLATE 10 MG PO TABS
10.0000 mg | ORAL_TABLET | Freq: Every day | ORAL | Status: DC
  Administered 2012-11-18: 10 mg via ORAL
  Filled 2012-11-18 (×2): qty 1

## 2012-11-18 MED ORDER — MEMANTINE HCL 10 MG PO TABS
10.0000 mg | ORAL_TABLET | Freq: Two times a day (BID) | ORAL | Status: DC
  Administered 2012-11-18 – 2012-11-20 (×4): 10 mg via ORAL
  Filled 2012-11-18 (×5): qty 1

## 2012-11-18 NOTE — Progress Notes (Signed)
[name] [id number] 1945-11-30   Subjective  Large BM per pt Denies pain Transferred to floor No other events   Objective:  Vital signs:  Filed Vitals:   11/17/12 1800 11/17/12 2032 11/18/12 0059 11/18/12 0500  BP: 157/82 173/87 161/82 157/87  Pulse: 93 104 109 100  Temp:  97.6 F (36.4 C)  98.1 F (36.7 C)  TempSrc:  Axillary  Oral  Resp: 22     Weight:  190 lb (86.183 kg)  188 lb 11.2 oz (85.594 kg)  SpO2: 100% 100%  100%    Last BM Date: 11/17/12  Intake/Output   Yesterday:  03/04 0701 - 03/05 0700 In: 1005 [P.O.:565; I.V.:440] Out: 1110 [Urine:610; Emesis/NG output:500] - thin bilious.  NO PO  This shift:     Bowel function:  Flatus: ?Yes BM: y  Physical Exam:  General: Pt awake/alert/oriented x4 in no acute distress Eyes: PERRL, normal EOM.  Sclera clear.  No icterus Neuro: CN II-XII intact w/o focal sensory/motor deficits. Lymph: No head/neck/groin lymphadenopathy Psych:  No delerium/psychosis/paranoia.  Mild dementia stable HENT: Normocephalic, Mucus membranes moist.  No thrush Neck: Supple, No tracheal deviation Chest: +chest wall pain at incision w good excursion CV:  Pulses intact.  Regular rhythm MS: Normal AROM mjr joints.  No obvious deformity Abdomen: Soft.  Nondistended.  Nontender.  No incarcerated hernias. Ext:  SCDs BLE.  No mjr edema.  No cyanosis Skin: No petechiae / purpurae  Problem List:  Principal Problem:   Ileus, postoperative Active Problems:   Dementia   Stroke   GERD (gastroesophageal reflux disease)   Hematemesis/vomiting blood   UGIB (upper gastrointestinal bleed)   Hemiparesis affecting right side as late effect of stroke   Expressive aphasia   Hypertension, accelerated   Pericardial effusion   Assessment  [name]  67 y.o. male  5 Days Post-Op  Procedure(s): SUBXYPHOID PERICARDIAL WINDOW  Ileus resolving  Plan:  -d/c NGT -try liquids PO -ARF resolving -NGT - hold on clamping trials  w high output - prob tomorrow. -no oral laxatives until tol solid foods  PR only -VTE prophylaxis- SCDs, etc -mobilize as tolerated to help recovery   The patient is stable.  There is no evidence of peritonitis, acute abdomen, nor shock.  There is no strong evidence of failure of improvement nor decline with current non-operative management.  There is no need for surgery at the present moment.  We will continue to follow.   Ardeth Sportsman, M.D., F.A.C.S. Gastrointestinal and Minimally Invasive Surgery [facility] [address], [address] [place], [place] [address] [phone] Main / Paging [phone] Voice Mail   11/18/2012  CARE TEAM:  PCP: No primary provider on file.  Outpatient Care Team: Patient has no care team.  Inpatient Treatment Team: Treatment Team: Attending Provider: Rhetta Mura, MD; Rounding Team: Loel Ro, MD; Consulting Physician: [name], MD; Consulting Physician: Donato Schultz, MD; Consulting Physician: Delight Ovens, MD; Technician: Florene Route, NT; Consulting Physician: Bishop Limbo, MD   Results:   Labs: Results for orders placed during the hospital encounter of 11/10/12 (from the past 48 hour(s))  CBC     Status: Abnormal   Collection Time    11/17/12  9:15 AM      Result Value Range   WBC 7.1  4.0 - 10.5 K/uL   RBC 4.46  4.22 - 5.81 MIL/uL   Hemoglobin 11.7 (*) 13.0 - 17.0 g/dL   HCT 95.2 (*)  39.0 - 52.0 %   MCV 80.7  78.0 - 100.0 fL   MCH 26.2  26.0 - 34.0 pg   MCHC 32.5  30.0 - 36.0 g/dL   RDW 16.1  09.6 - 04.5 %   Platelets 268  150 - 400 K/uL  BASIC METABOLIC PANEL     Status: Abnormal   Collection Time    11/17/12  9:15 AM      Result Value Range   Sodium 147 (*) 135 - 145 mEq/L   Potassium 3.4 (*) 3.5 - 5.1 mEq/L   Chloride 111  96 - 112 mEq/L   CO2 23  19 - 32 mEq/L   Glucose, Bld 121 (*) 70 - 99 mg/dL   BUN 12  6 - 23 mg/dL   Creatinine, Ser 4.09  0.50 - 1.35 mg/dL   Calcium  9.3  8.4 - 81.1 mg/dL   GFR calc non Af Amer 54 (*) >90 mL/min   GFR calc Af Amer 63 (*) >90 mL/min   Comment:            The eGFR has been calculated     using the CKD EPI equation.     This calculation has not been     validated in all clinical     situations.     eGFR's persistently     <90 mL/min signify     possible Chronic Kidney Disease.  CLOSTRIDIUM DIFFICILE BY PCR     Status: None   Collection Time    11/17/12  1:20 PM      Result Value Range   C difficile by pcr NEGATIVE  NEGATIVE  MAGNESIUM     Status: None   Collection Time    11/18/12  1:11 AM      Result Value Range   Magnesium 1.5  1.5 - 2.5 mg/dL    Imaging / Studies: Dg Abd Portable 2v  11/17/2012  *RADIOLOGY REPORT*  Clinical Data: Ileus versus small bowel obstruction, follow-up  PORTABLE ABDOMEN - 2 VIEW  Comparison: Portable exam 12/07 hours compared to 11/16/2012  Findings: Nasogastric tube projects over stomach. Abdominal deformity due to the thoracolumbar scoliosis. Air filled nondistended loops of large and small bowel in abdomen. No definite bowel dilatation, bowel wall thickening, or free intraperitoneal air. Degenerative changes thoracolumbar spine. Left pelvic phleboliths.  IMPRESSION: Nonobstructive bowel gas pattern. No bowel dilatation currently identified.   Original Report Authenticated By: Ulyses Southward, M.D.     Medications / Allergies: per chart  Antibiotics: Anti-infectives   Start     Dose/Rate Route Frequency Ordered Stop   11/13/12 1100  cefUROXime (ZINACEF) 1.5 g in dextrose 5 % 50 mL IVPB     1.5 g 100 mL/hr over 30 Minutes Intravenous 60 min pre-op 11/13/12 1008 11/13/12 1115   11/13/12 1053  cefUROXime (ZINACEF) 1.5 G injection    Comments:  Honor Loh B: cabinet override      11/13/12 1053 11/13/12 2259

## 2012-11-18 NOTE — Progress Notes (Signed)
MD notified of pt having bigeminy pvcs. Pt asymptomatic and asleep. New orders given for MG. Will continue to monitor the pt. Sanda Linger

## 2012-11-18 NOTE — Progress Notes (Signed)
No change in d/c plan. Patient will return to [facility] when medically stable per MD.  Clovis Cao is on chart for MD's signature.  Facility will accept back when stable.  Above discussed with Sherald Barge, LCSWA- CSW for 3W.  Lorri Frederick. [place]  [phone] (CSW assisting).

## 2012-11-18 NOTE — Progress Notes (Signed)
Subjective: No SOB, no CP, laying flat. Would like to get into chair.   Objective:  Vital Signs in the last 24 hours: Temp:  [97.6 F (36.4 C)-98.1 F (36.7 C)] 98.1 F (36.7 C) (03/05 0500) Pulse Rate:  [56-128] 100 (03/05 0500) Resp:  [13-25] 22 (03/04 1800) BP: (131-176)/(60-95) 157/87 mmHg (03/05 0500) SpO2:  [100 %] 100 % (03/05 0500) Weight:  [85.594 kg (188 lb 11.2 oz)-86.183 kg (190 lb)] 85.594 kg (188 lb 11.2 oz) (03/05 0500)  Intake/Output from previous day: 03/04 0701 - 03/05 0700 In: 1005 [P.O.:565; I.V.:440] Out: 1110 [Urine:610; Emesis/NG output:500]   Physical Exam: General: In bed, NAD Head:  Normocephalic and atraumatic. Lungs: Clear to auscultation and percussion. Heart: Normal S1 and S2 (borderline tachy).  No murmur, rubs or gallops.  Abdomen:mildly distended Extremities: No clubbing or cyanosis. No edema. Neurologic: Alert and oriented x 3.    Lab Results:  Recent Labs  11/17/12 0915  WBC 7.1  HGB 11.7*  PLT 268    Recent Labs  11/17/12 0915  NA 147*  K 3.4*  CL 111  CO2 23  GLUCOSE 121*  BUN 12  CREATININE 1.32  Imaging: Dg Abd Portable 2v  11/17/2012  *RADIOLOGY REPORT*  Clinical Data: Ileus versus small bowel obstruction, follow-up  PORTABLE ABDOMEN - 2 VIEW  Comparison: Portable exam 12/07 hours compared to 11/16/2012  Findings: Nasogastric tube projects over stomach. Abdominal deformity due to the thoracolumbar scoliosis. Air filled nondistended loops of large and small bowel in abdomen. No definite bowel dilatation, bowel wall thickening, or free intraperitoneal air. Degenerative changes thoracolumbar spine. Left pelvic phleboliths.  IMPRESSION: Nonobstructive bowel gas pattern. No bowel dilatation currently identified.   Original Report Authenticated By: Ulyses Southward, M.D.    Personally viewed.   Telemetry: Sinus/ sinus tachy 100.  Personally viewed.   Cardiac Studies:  EF 35%  Assessment/Plan:  Principal Problem:   Ileus,  postoperative Active Problems:   Dementia   Stroke   GERD (gastroesophageal reflux disease)   Hematemesis/vomiting blood   UGIB (upper gastrointestinal bleed)   Hemiparesis affecting right side as late effect of stroke   Expressive aphasia   Hypertension, accelerated   Pericardial effusion  Cardiomyopathy - On metoprolol. Change to PO succinate when able. All IV medications now. Add ACE-I when PO able. Consider hydral as well.   Tachycardia - improved today. No rub.   Pericardial effusion - CYTOLOGY negative. Reassuring.    SKAINS, MARK 11/18/2012, 8:23 AM

## 2012-11-18 NOTE — Progress Notes (Signed)
Patient ID: [name]  male  ZOX:[id number]    DOB: [birth year]    DOA: 11/10/2012  PCP: No primary Miliani Deike on file.  Brief Interim summary HPI: 67 yo male h/o cva with residual exp aphasia and rt upper ext weakness comes in with several episodes of n/v and coffee ground emesis for over 24 hours. He denies any fevers. He has mild expressive aphasia. No diarrhea. Some epigastric abd pain. On asa. No cp no sob. Sent from snf for further evaluation.  GI was consulted (Dr Bosie Clos) and patient underwent EGD which showed hiatal hernia with minimal antral gastritis but no bleeding source. Patient underwent CT abdomen and pelvis which was negative for any acute intra-abdominal pathology however did show large pericardial effusion. 2-D echo confirmed large free-flowing pericardial effusion with no temp no physiology likely chronic with possible hypokinesis of the mid to distal inferoseptal myocardium. Cardiology has been following the patient, patient underwent a pericardial window on 11/13/12, pericardial fluid cultures has been negative so far. Drain was removed on 3/1. Subsequently patient has developed a postop ileus. He was.o., on NGT suction, CCS has been following the patient-subsequently was cleared for clear diet liquids and this was graduated as he passed a large - Transfer the patient to telemetry floor 3.4.14   Consults: GI: Dr Bosie Clos, signed off Cardiothoracic: Dr Cornelius Moras Cardiology: Dr Anne Fu CCS- Dr Michaell Cowing     Assessment/Plan: Principal Problem:  Postop ileus: Abdominal distention improving, large BM per RN   - General surgery following-recommended liquids by mouth, recommended to mobilize. -graduate diet as tolerated -Last portable abdominal x-ray done 11/17/2012 showed no residual ileus  Pericardial effusion: s/p pericardial window on 2/28  - ECHO :showed large free flowing pericardial effusion, no tamponade physiology, EF 35-40%, Possible hypokinesis of the  mid-distalinferoseptal myocardium. - Appreciate CT surgery assistance, s/p Subxiphoid pericardial window and drainage of pericardial effusion. - Fluid cultures negative since 11/16/2012-tube out 3/1  Hematemesis/vomiting blood, upper GI bleed: restarted with ileus but improved now - EGD done on 2/26 showed hiatal hernia, minimal antral gastritis, no bleeding source. GI has signed off.  - CT abdomen and pelvis was done which showed small bilateral pleural effusions, cardiac enlargement with pericardial effusion - Continue PPI, H/H stable  Hypertension, accelerated with tachycardia: - Converted back from metoprolol IV to amlodipine 10 mg daily, labetalol 300 twice a day - Cardiology following-appreciate input-unclear if would benefit from further workup of hypokinesis as shown on echo -We'll add further medications as per home MAR as needed  Dementia: cont namenda 10 mg, added back 11/18/2012  GERD (gastroesophageal reflux disease): on PPI  Hemiparesis affecting right side as late effect of stroke,  Expressive aphasia: Appears to be from the prior stroke - CT head did not show any acute CVA, PT eval -> will return to SNF when medically clear -Home medications do not include aspirin or Plavix therefore will need to investigate the same-potentially stopped because of GI bleeding  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  AM labs   DVT Prophylaxis: SCDs  Code Status:  Disposition: Will transfer to telemetry floor today  Subjective: Abdominal distention improving-expressive aphasia continues with patient trying to express himself but visibly frustrated.  Objective: Weight change: -6.417 kg (-14 lb 2.3 oz)  Intake/Output Summary (Last 24 hours) at 11/18/12 1519 Last data filed at 11/18/12 1000  Gross per 24 hour  Intake    565 ml  Output    560 ml  Net  5 ml   Blood pressure 149/85, pulse 101, temperature 99.4 F (37.4 C), temperature source Oral,  resp. rate 22, weight 85.594 kg (188 lb 11.2 oz), SpO2 95.00%.  Physical Exam: General: AxOx3 dysarthria, NGT suctioning, sitting up CVS: S1-S2 clear Chest: CTAB, Abdomen: soft, BS+, distension improved Extremities: no c/c/e bilaterally Neuro: right-sided weakness (from prior stroke), dysarthria   Lab Results: Basic Metabolic Panel:  Recent Labs Lab 11/15/12 0550 11/17/12 0915 11/18/12 0111  NA 143 147*  --   K 4.1 3.4*  --   CL 112 111  --   CO2 22 23  --   GLUCOSE 117* 121*  --   BUN 18 12  --   CREATININE 1.41* 1.32  --   CALCIUM 8.3* 9.3  --   MG  --   --  1.5   Liver Function Tests:  Recent Labs Lab 11/11/12 1703  AST 17  ALT 10  ALKPHOS 46  BILITOT 0.2*  PROT 6.5  ALBUMIN 3.1*   CBC:  Recent Labs Lab 11/15/12 0550 11/17/12 0915  WBC 7.9 7.1  HGB 10.0* 11.7*  HCT 30.6* 36.0*  MCV 81.2 80.7  PLT 203 268   Cardiac Enzymes: No results found for this basename: CKTOTAL, CKMB, CKMBINDEX, TROPONINI,  in the last 168 hours   Micro Results: Recent Results (from the past 240 hour(s))  MRSA PCR SCREENING     Status: None   Collection Time    11/11/12  1:24 PM      Result Value Range Status   MRSA by PCR NEGATIVE  NEGATIVE Final   Comment:            The GeneXpert MRSA Assay (FDA     approved for NASAL specimens     only), is one component of a     comprehensive MRSA colonization     surveillance program. It is not     intended to diagnose MRSA     infection nor to guide or     monitor treatment for     MRSA infections.  AFB CULTURE WITH SMEAR     Status: None   Collection Time    11/13/12 12:00 PM      Result Value Range Status   Specimen Description FLUID PERICARDIAL   Final   Special Requests NONE   Final   ACID FAST SMEAR NO ACID FAST BACILLI SEEN   Final   Culture     Final   Value: CULTURE WILL BE EXAMINED FOR 6 WEEKS BEFORE ISSUING A FINAL REPORT   Report Status PENDING   Incomplete  FUNGUS CULTURE W SMEAR     Status: None    Collection Time    11/13/12 12:00 PM      Result Value Range Status   Specimen Description FLUID PERICARDIAL   Final   Special Requests NONE   Final   Fungal Smear NO YEAST OR FUNGAL ELEMENTS SEEN   Final   Culture CULTURE IN PROGRESS FOR FOUR WEEKS   Final   Report Status PENDING   Incomplete  BODY FLUID CULTURE     Status: None   Collection Time    11/13/12 12:00 PM      Result Value Range Status   Specimen Description FLUID PERICARDIAL   Final   Special Requests NONE   Final   Gram Stain     Final   Value: NO WBC SEEN     NO ORGANISMS SEEN   Culture NO  GROWTH 3 DAYS   Final   Report Status 11/16/2012 FINAL   Final  CLOSTRIDIUM DIFFICILE BY PCR     Status: None   Collection Time    11/17/12  1:20 PM      Result Value Range Status   C difficile by pcr NEGATIVE  NEGATIVE Final    Studies/Results: Dg Chest 2 View  11/11/2012  *RADIOLOGY REPORT*  Clinical Data: Chest pain.  CHEST - 2 VIEW  Comparison: 10/23/2007 and prior chest radiographs  Findings: Cardiomegaly and peribronchial thickening again noted. Left basilar scarring/density is unchanged. There is no evidence of pleural effusion, pneumothorax or pulmonary edema. No acute bony abnormalities are identified.  IMPRESSION: Cardiomegaly without evidence of acute cardiopulmonary disease.   Original Report Authenticated By: Harmon Pier, M.D.    Dg Abd 2 Views  11/11/2012  *RADIOLOGY REPORT*  Clinical Data: Abdominal pain and vomiting.  ABDOMEN - 2 VIEW  Comparison: 01/15/2011 radiographs  Findings: Gas filled loops of colon and small bowel identified. No dilated loops of bowel are present. There is no evidence of pneumoperitoneum. No suspicious calcifications are noted. A severe thoracolumbar scoliosis is again identified.  IMPRESSION: Nonspecific nonobstructive bowel gas pattern - no evidence of pneumoperitoneum.   Original Report Authenticated By: Harmon Pier, M.D.     Medications: Scheduled Meds: . antiseptic oral rinse  15 mL  Mouth Rinse q12n4p  . bisacodyl  10 mg Rectal Daily  . chlorhexidine  15 mL Mouth Rinse BID  . lip balm  1 application Topical BID  . metoprolol  10 mg Intravenous Q6H  . pantoprazole (PROTONIX) IV  40 mg Intravenous Q12H  . potassium chloride  40 mEq Oral Daily      LOS: 8 days   Rhetta Mura M.D. [facility] 11/18/2012, 3:19 PM Pager: [phone]  If 7PM-7AM, please contact night-coverage [email or web address] Password [email or web address]

## 2012-11-19 LAB — MAGNESIUM: Magnesium: 1.6 mg/dL (ref 1.5–2.5)

## 2012-11-19 MED ORDER — METOPROLOL SUCCINATE ER 100 MG PO TB24
200.0000 mg | ORAL_TABLET | Freq: Every day | ORAL | Status: DC
  Administered 2012-11-19 – 2012-11-20 (×2): 200 mg via ORAL
  Filled 2012-11-19 (×2): qty 2

## 2012-11-19 MED ORDER — ACETAMINOPHEN 325 MG PO TABS
650.0000 mg | ORAL_TABLET | ORAL | Status: DC | PRN
  Administered 2012-11-19: 650 mg via ORAL

## 2012-11-19 MED ORDER — BISACODYL 10 MG RE SUPP: 10.0000 mg | Freq: Two times a day (BID) | RECTAL | Status: DC | PRN

## 2012-11-19 MED ORDER — PANTOPRAZOLE SODIUM 40 MG PO TBEC
40.0000 mg | DELAYED_RELEASE_TABLET | Freq: Two times a day (BID) | ORAL | Status: DC
  Administered 2012-11-19 – 2012-11-20 (×4): 40 mg via ORAL
  Filled 2012-11-19 (×4): qty 1

## 2012-11-19 MED ORDER — SACCHAROMYCES BOULARDII 250 MG PO CAPS
250.0000 mg | ORAL_CAPSULE | Freq: Two times a day (BID) | ORAL | Status: DC
  Administered 2012-11-19 – 2012-11-20 (×3): 250 mg via ORAL
  Filled 2012-11-19 (×4): qty 1

## 2012-11-19 MED ORDER — LABETALOL HCL 300 MG PO TABS
450.0000 mg | ORAL_TABLET | Freq: Two times a day (BID) | ORAL | Status: DC
  Filled 2012-11-19 (×2): qty 1.5

## 2012-11-19 MED ORDER — PSYLLIUM 95 % PO PACK
1.0000 | PACK | Freq: Two times a day (BID) | ORAL | Status: DC
  Administered 2012-11-19 (×2): 1 via ORAL
  Administered 2012-11-20: 12:00:00 via ORAL
  Filled 2012-11-19 (×4): qty 1

## 2012-11-19 NOTE — Progress Notes (Signed)
Subjective:  No complaints. No CP, no SOB. Had about 20 beats of NSVT asymptomatic at 730 am.   Objective:  Vital Signs in the last 24 hours: Temp:  [97.9 F (36.6 C)-99.4 F (37.4 C)] 98.5 F (36.9 C) (03/06 0454) Pulse Rate:  [95-113] 95 (03/06 0454) Resp:  [22] 22 (03/05 1317) BP: (118-173)/(67-92) 118/67 mmHg (03/06 0454) SpO2:  [95 %-100 %] 97 % (03/06 0454) Weight:  [86.818 kg (191 lb 6.4 oz)] 86.818 kg (191 lb 6.4 oz) (03/06 0454)  Intake/Output from previous day: 03/05 0701 - 03/06 0700 In: 1035 [P.O.:600; I.V.:435] Out: 56 [Urine:50; Stool:1]   Physical Exam: General: In NAD Head:  Normocephalic and atraumatic. Lungs: Clear to auscultation and percussion. Heart: Normal S1 and S2.  No murmur, rubs or gallops.  Abdomen: soft, non-tender, positive bowel sounds. Extremities: No clubbing or cyanosis. No edema.  Neurologic: Alert and oriented x 3. Prior stroke noted    Lab Results:  Recent Labs  11/17/12 0915  WBC 7.1  HGB 11.7*  PLT 268    Recent Labs  11/17/12 0915  NA 147*  K 3.4*  CL 111  CO2 23  GLUCOSE 121*  BUN 12  CREATININE 1.32   Imaging: Dg Abd Portable 2v  11/17/2012  *RADIOLOGY REPORT*  Clinical Data: Ileus versus small bowel obstruction, follow-up  PORTABLE ABDOMEN - 2 VIEW  Comparison: Portable exam 12/07 hours compared to 11/16/2012  Findings: Nasogastric tube projects over stomach. Abdominal deformity due to the thoracolumbar scoliosis. Air filled nondistended loops of large and small bowel in abdomen. No definite bowel dilatation, bowel wall thickening, or free intraperitoneal air. Degenerative changes thoracolumbar spine. Left pelvic phleboliths.  IMPRESSION: Nonobstructive bowel gas pattern. No bowel dilatation currently identified.   Original Report Authenticated By: Ulyses Southward, M.D.    Personally viewed.   Telemetry: 20 beats NSVT Personally viewed.  Cardiac Studies:  EF 35%  Assessment/Plan:  Principal Problem:   Ileus,  postoperative Active Problems:   Dementia   Stroke   GERD (gastroesophageal reflux disease)   Hematemesis/vomiting blood   UGIB (upper gastrointestinal bleed)   Hemiparesis affecting right side as late effect of stroke   Expressive aphasia   Hypertension, accelerated   Pericardial effusion  Cardiomyopathy -Changed to metoprolol ER 200mg  QD. Will add ACE-I when able. Consider hydral as well.   Ventricular Tachycardia - icardiomyopathy noted. Increased Bb.  No rub.   Pericardial effusion - CYTOLOGY negative. Reassuring.     SKAINS, MARK 11/19/2012, 9:25 AM

## 2012-11-19 NOTE — Progress Notes (Signed)
[name] [id number] Nov 08, 1945   Subjective  Daily BM Denies pain Tol fulls  Objective:  Vital signs:  Filed Vitals:   11/18/12 1317 11/18/12 1700 11/18/12 2103 11/19/12 0454  BP: 149/85 173/92 136/79 118/67  Pulse: 101 100 113 95  Temp: 99.4 F (37.4 C)  97.9 F (36.6 C) 98.5 F (36.9 C)  TempSrc: Oral  Axillary Oral  Resp: 22     Weight:    191 lb 6.4 oz (86.818 kg)  SpO2: 95%  100% 97%    Last BM Date: 11/18/12  Intake/Output   Yesterday:  03/05 0701 - 03/06 0700 In: 1035 [P.O.:600; I.V.:435] Out: 51 [Urine:50; Stool:1] - thin bilious.  NO PO  This shift:     Bowel function:  Flatus: Yes BM: Yes, large  Physical Exam:  General: Pt awake/alert/oriented x4 in no acute distress Eyes: PERRL, normal EOM.  Sclera clear.  No icterus Neuro: CN II-XII intact w/o focal sensory/motor deficits. Lymph: No head/neck/groin lymphadenopathy Psych:  No delerium/psychosis/paranoia.  Mild dementia stable HENT: Normocephalic, Mucus membranes moist.  No thrush Neck: Supple, No tracheal deviation Chest: +chest wall pain at incision w good excursion CV:  Pulses intact.  Regular rhythm MS: Normal AROM mjr joints.  No obvious deformity Abdomen: Soft.  Obese.  Nondistended.  Nontender.  No incarcerated hernias. Ext:  SCDs BLE.  No mjr edema.  No cyanosis Skin: No petechiae / purpurae  Problem List:  Principal Problem:   Ileus, postoperative Active Problems:   Dementia   Stroke   GERD (gastroesophageal reflux disease)   Hematemesis/vomiting blood   UGIB (upper gastrointestinal bleed)   Hemiparesis affecting right side as late effect of stroke   Expressive aphasia   Hypertension, accelerated   Pericardial effusion   Assessment  [name]  67 y.o. male  6 Days Post-Op  Procedure(s): SUBXYPHOID PERICARDIAL WINDOW  Ileus resolving  Plan:  --adv to solids -ARF resolving -Bowel regimen w fiber -VTE prophylaxis- SCDs, etc -mobilize as tolerated to  help recovery  Will sign off - call w questions The patient is stable.  There is no evidence of peritonitis, acute abdomen, nor shock.  There is no strong evidence of failure of improvement nor decline with current non-operative management.  There is no need for surgery at the present moment.  We will continue to follow.   Ardeth Sportsman, M.D., F.A.C.S. Gastrointestinal and Minimally Invasive Surgery [facility] [address] [place], [place] [address] [phone] Main / Paging [phone] Voice Mail   11/19/2012  CARE TEAM:  PCP: No primary Trevaun Rendleman on file.  Outpatient Care Team: Patient has no care team.  Inpatient Treatment Team: Treatment Team: Attending Glendel Jaggers: Rhetta Mura, MD; Rounding Team: Loel Ro, MD; Consulting Physician: [name], MD; Consulting Physician: Donato Schultz, MD; Consulting Physician: Delight Ovens, MD; Consulting Physician: Bishop Limbo, MD; Social Worker: Drue Flirt, LCSW   Results:   Labs: Results for orders placed during the hospital encounter of 11/10/12 (from the past 48 hour(s))  CBC     Status: Abnormal   Collection Time    11/17/12  9:15 AM      Result Value Range   WBC 7.1  4.0 - 10.5 K/uL   RBC 4.46  4.22 - 5.81 MIL/uL   Hemoglobin 11.7 (*) 13.0 - 17.0 g/dL   HCT 41.3 (*) 24.4 - 01.0 %   MCV 80.7  78.0 - 100.0 fL   MCH 26.2  26.0 - 34.0 pg   MCHC 32.5  30.0 - 36.0 g/dL   RDW 21.3  08.6 - 57.8 %   Platelets 268  150 - 400 K/uL  BASIC METABOLIC PANEL     Status: Abnormal   Collection Time    11/17/12  9:15 AM      Result Value Range   Sodium 147 (*) 135 - 145 mEq/L   Potassium 3.4 (*) 3.5 - 5.1 mEq/L   Chloride 111  96 - 112 mEq/L   CO2 23  19 - 32 mEq/L   Glucose, Bld 121 (*) 70 - 99 mg/dL   BUN 12  6 - 23 mg/dL   Creatinine, Ser 4.69  0.50 - 1.35 mg/dL   Calcium 9.3  8.4 - 62.9 mg/dL   GFR calc non Af Amer 54 (*) >90 mL/min   GFR calc Af Amer 63 (*) >90 mL/min    Comment:            The eGFR has been calculated     using the CKD EPI equation.     This calculation has not been     validated in all clinical     situations.     eGFR's persistently     <90 mL/min signify     possible Chronic Kidney Disease.  CLOSTRIDIUM DIFFICILE BY PCR     Status: None   Collection Time    11/17/12  1:20 PM      Result Value Range   C difficile by pcr NEGATIVE  NEGATIVE  MAGNESIUM     Status: None   Collection Time    11/18/12  1:11 AM      Result Value Range   Magnesium 1.5  1.5 - 2.5 mg/dL    Imaging / Studies: Dg Abd Portable 2v  11/17/2012  *RADIOLOGY REPORT*  Clinical Data: Ileus versus small bowel obstruction, follow-up  PORTABLE ABDOMEN - 2 VIEW  Comparison: Portable exam 12/07 hours compared to 11/16/2012  Findings: Nasogastric tube projects over stomach. Abdominal deformity due to the thoracolumbar scoliosis. Air filled nondistended loops of large and small bowel in abdomen. No definite bowel dilatation, bowel wall thickening, or free intraperitoneal air. Degenerative changes thoracolumbar spine. Left pelvic phleboliths.  IMPRESSION: Nonobstructive bowel gas pattern. No bowel dilatation currently identified.   Original Report Authenticated By: Ulyses Southward, M.D.     Medications / Allergies: per chart  Antibiotics: Anti-infectives   Start     Dose/Rate Route Frequency Ordered Stop   11/13/12 1100  cefUROXime (ZINACEF) 1.5 g in dextrose 5 % 50 mL IVPB     1.5 g 100 mL/hr over 30 Minutes Intravenous 60 min pre-op 11/13/12 1008 11/13/12 1115   11/13/12 1053  cefUROXime (ZINACEF) 1.5 G injection    Comments:  Honor Loh B: cabinet override      11/13/12 1053 11/13/12 2259

## 2012-11-19 NOTE — Progress Notes (Signed)
UR chart review completed.  

## 2012-11-19 NOTE — Progress Notes (Signed)
Patient ID: [name]  male  WUJ:[id number]    DOB: 1946-06-21    DOA: 11/10/2012  PCP: No primary Aloha Bartok on file.  Brief Interim summary HPI: 67 yo male h/o cva with residual exp aphasia and rt upper ext weakness comes in with several episodes of n/v and coffee ground emesis for over 24 hours. He denies any fevers. He has mild expressive aphasia. No diarrhea. Some epigastric abd pain. On asa. No cp no sob. Sent from snf for further evaluation.  GI was consulted (Dr Bosie Clos) and patient underwent EGD which showed hiatal hernia with minimal antral gastritis but no bleeding source. Patient underwent CT abdomen and pelvis which was negative for any acute intra-abdominal pathology however did show large pericardial effusion. 2-D echo confirmed large free-flowing pericardial effusion with no temp no physiology likely chronic with possible hypokinesis of the mid to distal inferoseptal myocardium. Cardiology has been following the patient, patient underwent a pericardial window on 11/13/12, pericardial fluid cultures has been negative so far. Drain was removed on 3/1. Subsequently patient has developed a postop ileus. He was.o., on NGT suction, [facility]CS has been following the patient-subsequently was cleared for clear diet liquids and this was graduated as he passed a large stool. He was transitioned to by mouth medications but then developed some arrhythmias with maybe 50 beat run of V. tach on 11/20/2038 and medications were adjusted further.  Consults: GI: Dr Bosie Clos, signed off Cardiothoracic: Dr Cornelius Moras Cardiology: Dr Anne Fu CCS- Dr Michaell Cowing     Assessment/Plan: Principal Problem:  Postop ileus: Abdominal distention improving, large BM per RN   - General surgery following-recommended liquids--> plan that by mouth, recommended to mobilize. Signed off as of 11/19/2012 input greatly appreciated -Last portable abdominal x-ray done 11/17/2012 showed no residual ileus  Pericardial effusion: s/p  pericardial window on 2/28  - ECHO :showed large free flowing pericardial effusion, no tamponade physiology, EF 35-40%, Possible hypokinesis of the mid-distalinferoseptal myocardium-unclear if would benefit from further workup by cardiology - Appreciate CT surgery assistance, s/p Subxiphoid pericardial window and drainage of pericardial effusion. - Fluid cultures negative since 11/16/2012-tube out 3/1  Hematemesis/vomiting blood, upper GI bleed: restarted with ileus but improved now - EGD done on 2/26 showed hiatal hernia, minimal antral gastritis, no bleeding source. GI has signed off.  - CT abdomen and pelvis was done which showed small bilateral pleural effusions, cardiac enlargement with pericardial effusion - Continue PPI, H/H stable -Home medications do not include aspirin or Plavix therefore will need to investigate the same-potentially stopped because of GI bleeding -Potentially will need aspirin/Plavix in about 8 weeks, will need gastroenterology input prior to re administering this  Hypertension, accelerated with tachycardia: - Converted back from metoprolol IV to regimen -Developed runs of sustained V. tach 50 beats 11/19/12 a.m. 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  AM labs   DVT Prophylaxis: SCDs  Code Status: Full  Disposition: Likely transfer to skilled nursing facility if no tachycardia/no other issues  Subjective: Abdominal distention improving-expressive aphasia  continues with patient trying to express himself but visibly frustrated. States he is doing fairly otherwise. Nursing reports patient did not tolerate full diet well yesterday.  Objective: Weight change: 0.635 kg (1 lb 6.4 oz)  Intake/Output Summary (Last 24 hours) at 11/19/12 0825 Last data filed at 11/19/12 0511  Gross per 24 hour  Intake   1035 ml  Output     51 ml  Net    984 ml   Blood pressure 118/67, pulse 95, temperature 98.5 F (36.9 C), temperature source Oral, resp. rate 22, weight 86.818 kg (191 lb 6.4 oz), SpO2 97.00%.  Physical Exam: General: AxOx3 dysarthria, NGT suctioning, sitting up CVS: S1-S2 clear Chest: CTAB, Abdomen: soft, BS+, distension improved Extremities: no c/c/e bilaterally Neuro: right-sided weakness (from prior stroke), dysarthria   Lab Results: Basic Metabolic Panel:  Recent Labs Lab 11/15/12 0550 11/17/12 0915 11/18/12 0111  NA 143 147*  --   K 4.1 3.4*  --   CL 112 111  --   CO2 22 23  --   GLUCOSE 117* 121*  --   BUN 18 12  --   CREATININE 1.41* 1.32  --   CALCIUM 8.3* 9.3  --   MG  --   --  1.5   Liver Function Tests: No results found for this basename: AST, ALT, ALKPHOS, BILITOT, PROT, ALBUMIN,  in the last 168 hours CBC:  Recent Labs Lab 11/15/12 0550 11/17/12 0915  WBC 7.9 7.1  HGB 10.0* 11.7*  HCT 30.6* 36.0*  MCV 81.2 80.7  PLT 203 268   Cardiac Enzymes: No results found for this basename: CKTOTAL, CKMB, CKMBINDEX, TROPONINI,  in the last 168 hours   Micro Results: Recent Results (from the past 240 hour(s))  MRSA PCR SCREENING     Status: None   Collection Time    11/11/12  1:24 PM      Result Value Range Status   MRSA by PCR NEGATIVE  NEGATIVE Final   Comment:            The GeneXpert MRSA Assay (FDA     approved for NASAL specimens     only), is one component of a     comprehensive MRSA colonization     surveillance program. It is not     intended to diagnose MRSA     infection nor to guide or      monitor treatment for     MRSA infections.  AFB CULTURE WITH SMEAR     Status: None   Collection Time    11/13/12 12:00 PM      Result Value Range Status   Specimen Description FLUID PERICARDIAL   Final   Special Requests NONE   Final   ACID FAST SMEAR NO ACID FAST BACILLI SEEN   Final   Culture     Final   Value: CULTURE WILL BE EXAMINED FOR 6 WEEKS BEFORE ISSUING A FINAL REPORT   Report Status PENDING   Incomplete  FUNGUS CULTURE W SMEAR     Status: None   Collection Time    11/13/12 12:00 PM      Result Value Range Status   Specimen Description FLUID PERICARDIAL   Final   Special Requests NONE   Final   Fungal Smear NO YEAST OR FUNGAL ELEMENTS SEEN   Final   Culture CULTURE IN PROGRESS FOR FOUR WEEKS   Final   Report Status PENDING   Incomplete  BODY FLUID CULTURE  Status: None   Collection Time    11/13/12 12:00 PM      Result Value Range Status   Specimen Description FLUID PERICARDIAL   Final   Special Requests NONE   Final   Gram Stain     Final   Value: NO WBC SEEN     NO ORGANISMS SEEN   Culture NO GROWTH 3 DAYS   Final   Report Status 11/16/2012 FINAL   Final  CLOSTRIDIUM DIFFICILE BY PCR     Status: None   Collection Time    11/17/12  1:20 PM      Result Value Range Status   C difficile by pcr NEGATIVE  NEGATIVE Final    Studies/Results: Dg Chest 2 View  11/11/2012  *RADIOLOGY REPORT*  Clinical Data: Chest pain.  CHEST - 2 VIEW  Comparison: 10/23/2007 and prior chest radiographs  Findings: Cardiomegaly and peribronchial thickening again noted. Left basilar scarring/density is unchanged. There is no evidence of pleural effusion, pneumothorax or pulmonary edema. No acute bony abnormalities are identified.  IMPRESSION: Cardiomegaly without evidence of acute cardiopulmonary disease.   Original Report Authenticated By: Harmon Pier, M.D.    Dg Abd 2 Views  11/11/2012  *RADIOLOGY REPORT*  Clinical Data: Abdominal pain and vomiting.  ABDOMEN - 2 VIEW  Comparison:  01/15/2011 radiographs  Findings: Gas filled loops of colon and small bowel identified. No dilated loops of bowel are present. There is no evidence of pneumoperitoneum. No suspicious calcifications are noted. A severe thoracolumbar scoliosis is again identified.  IMPRESSION: Nonspecific nonobstructive bowel gas pattern - no evidence of pneumoperitoneum.   Original Report Authenticated By: Harmon Pier, M.D.     Medications: Scheduled Meds: . amLODipine  10 mg Oral Daily  . antiseptic oral rinse  15 mL Mouth Rinse q12n4p  . chlorhexidine  15 mL Mouth Rinse BID  . citalopram  5 mg Oral Daily  . labetalol  300 mg Oral BID  . lip balm  1 application Topical BID  . memantine  10 mg Oral BID  . pantoprazole (PROTONIX) IV  40 mg Intravenous Q12H  . potassium chloride  40 mEq Oral Daily  . psyllium  1 packet Oral BID  . saccharomyces boulardii  250 mg Oral BID  . tamsulosin  0.4 mg Oral Daily      LOS: 9 days   Rhetta Mura M.D. [facility] 11/19/2012, 8:25 AM Pager: [phone]  If 7PM-7AM, please contact night-coverage [email or web address] Password [email or web address]

## 2012-11-20 LAB — BASIC METABOLIC PANEL
BUN: 11 mg/dL (ref 6–23)
Calcium: 8.7 mg/dL (ref 8.4–10.5)
GFR calc Af Amer: 60 mL/min — ABNORMAL LOW (ref >90)
GFR calc non Af Amer: 51 mL/min — ABNORMAL LOW (ref >90)
Glucose, Bld: 105 mg/dL — ABNORMAL HIGH (ref 70–99)
Potassium: 3.9 mEq/L (ref 3.5–5.1)

## 2012-11-20 LAB — CBC
Hemoglobin: 11.2 g/dL — ABNORMAL LOW (ref 13.0–17.0)
MCH: 25.6 pg — ABNORMAL LOW (ref 26.0–34.0)
MCHC: 32.7 g/dL (ref 30.0–36.0)
Platelets: 275 10*3/uL (ref 150–400)
RDW: 14.4 % (ref 11.5–15.5)

## 2012-11-20 MED ORDER — LISINOPRIL 10 MG PO TABS: 10.0000 mg | ORAL_TABLET | Freq: Every day | ORAL | Status: DC

## 2012-11-20 MED ORDER — METOPROLOL TARTRATE 50 MG PO TABS: 50.0000 mg | ORAL_TABLET | Freq: Every evening | ORAL | Status: DC

## 2012-11-20 MED ORDER — METOPROLOL SUCCINATE ER 200 MG PO TB24: 200.0000 mg | ORAL_TABLET | Freq: Every day | ORAL | Status: DC

## 2012-11-20 MED ORDER — LISINOPRIL 10 MG PO TABS
10.0000 mg | ORAL_TABLET | Freq: Every day | ORAL | Status: DC
  Administered 2012-11-20: 10 mg via ORAL
  Filled 2012-11-20: qty 1

## 2012-11-20 NOTE — Progress Notes (Signed)
Physical Therapy Treatment Patient Details Name: [name] MRN: [id number] DOB: 04-16-1946 Today's Date: 11/20/2012 Time: 4098-1191 PT Time Calculation (min): 23 min  PT Assessment / Plan / Recommendation Comments on Treatment Session  Pt admitted with GIB and pericardial effusion from SNF progressing with mobility and happy to walk today. Pt very pleasant and eager to move but fatigued end of session. Pt continues to lack awareness of right side in space and does not position extremities without assist prior to transfers.     Follow Up Recommendations  SNF     Does the patient have the potential to tolerate intense rehabilitation     Barriers to Discharge        Equipment Recommendations       Recommendations for Other Services    Frequency     Plan Discharge plan remains appropriate;Frequency remains appropriate    Precautions / Restrictions Precautions Precautions: Fall Precaution Comments: R HP from prior CVA   Pertinent Vitals/Pain No pain    Mobility  Bed Mobility Bed Mobility: Not assessed Transfers Transfers: Sit to Stand;Stand to Sit;Stand Pivot Transfers Sit to Stand: 1: +2 Total assist;From chair/3-in-1;With armrests Sit to Stand: Patient Percentage: 50% Stand to Sit: 1: +2 Total assist;To chair/3-in-1;With armrests Stand to Sit: Patient Percentage: 50% Stand Pivot Transfers: 1: +2 Total assist Stand Pivot Transfers: Patient Percentage: 50% Details for Transfer Assistance: Pt stood from chair with assist secondary to posterior lean and pivoted to and from [facility] all with 2 person assist pushing from armrest with assist to uncross feet with each trial and max cueing and assist for safety. After transfers pt then stood x 2 from chair using bed rail as support to pull up on with decreased assist needed to stand but max assist and cueing for safety to sit due to lack of control. Ambulation/Gait Ambulation/Gait Assistance: 2: Max assist (+1 for chair and  safety) Ambulation Distance (Feet): 15 Feet Assistive device: Other (Comment) Ambulation/Gait Assistance Details: Pt held onto bed rail entire length of bed on left with bed height elevated and walked down length of bed twice and backward once prior to fatigue and chair pulled to him.  Gait Pattern: Step-to pattern Gait velocity: decreased Stairs: No Wheelchair Mobility Wheelchair Mobility: Yes Wheelchair Assistance: 4: Administrator, sports Details (indicate cue type and reason): pt propelled recliner 15 ft to door with min assist to steer due to lack of WC wheel to direct chair and pt propelling with left foot    Exercises     PT Diagnosis:    PT Problem List:   PT Treatment Interventions:     PT Goals Acute Rehab PT Goals PT Goal: Sit to Stand - Progress: Progressing toward goal PT Goal: Stand to Sit - Progress: Progressing toward goal PT Transfer Goal: Bed to Chair/Chair to Bed - Progress: Progressing toward goal PT Goal: Ambulate - Progress: Progressing toward goal  Visit Information  Last PT Received On: 11/20/12 Assistance Needed: +2    Subjective Data  Subjective: " I walk"   Cognition  Cognition Overall Cognitive Status: Difficult to assess Area of Impairment: Problem solving;Safety/judgement Difficult to assess due to: Impaired communication Arousal/Alertness: Awake/alert Behavior During Session: [facility] for tasks performed Safety/Judgement: Decreased safety judgement for tasks assessed    Balance     End of Session PT - End of Session Equipment Utilized During Treatment: Gait belt Activity Tolerance: Patient tolerated treatment well Patient left: in chair;with call bell/phone within reach;with chair alarm set Nurse  Communication: Mobility status   GP     [name] 11/20/2012, 10:53 AM [name], PT [phone]

## 2012-11-20 NOTE — Discharge Summary (Signed)
Physician Discharge Summary  [name] ZOX:[id number] DOB: 13-Mar-1946 DOA: 11/10/2012  PCP: No primary provider on file.  Admit date: 11/10/2012 Discharge date: 11/20/2012  Time spent: 40 min  Recommendations for Outpatient Follow-up:  1. Recommend close outpatient followup with cardiologist for tachycardia/hypokinesis of heart seen on echo-may need outpatient stratification and invasive ischemic study 2. Consider repeat endoscopy in 6-8 weeks. Probably will need to be on either aspirin/Plavix in the future.  3. Repeat basic metabolic panel and CBC in about a week  4. Patient recommended to be discharged to skilled nursing facility for further therapies as an outpatient  Discharge Diagnoses:  Principal Problem:   Ileus, postoperative Active Problems:   Dementia   Stroke   GERD (gastroesophageal reflux disease)   Hematemesis/vomiting blood   UGIB (upper gastrointestinal bleed)   Hemiparesis affecting right side as late effect of stroke   Expressive aphasia   Hypertension, accelerated   Pericardial effusion   Discharge Condition: Fair  Diet recommendation: Heart healthy low-salt  Filed Weights   11/18/12 0500 11/19/12 0454 11/20/12 0500  Weight: 85.594 kg (188 lb 11.2 oz) 86.818 kg (191 lb 6.4 oz) 86.365 kg (190 lb 6.4 oz)    History of present illness:  67 yo male h/o cva with residual exp aphasia and rt upper ext weakness comes in with several episodes of n/v and coffee ground emesis for over 24 hours. He denies any fevers. He has mild expressive aphasia. No diarrhea. Some epigastric abd pain. On asa. No cp no sob. Sent from snf for further evaluation.  GI was consulted (Dr Bosie Clos) and patient underwent EGD which showed hiatal hernia with minimal antral gastritis but no bleeding source. Patient underwent CT abdomen and pelvis which was negative for any acute intra-abdominal pathology however did show large pericardial effusion. 2-D echo confirmed large free-flowing  pericardial effusion with no temp no physiology likely chronic with possible hypokinesis of the mid to distal inferoseptal myocardium. Cardiology has been following the patient, patient underwent a pericardial window on 11/13/12, pericardial fluid cultures has been negative so far. Drain was removed on 3/1. Subsequently patient has developed a postop ileus. He was on NG suction, CCS has been following the patient-subsequently was cleared for clear diet liquids and this was graduated as he passed a large stool.  He was transitioned to by mouth medications but then developed some arrhythmias with maybe 50 beat run of V. tach on 11/20/2038 and medications were adjusted further.    Hospital Course:   Postop ileus, after pericardial window: Abdominal distention improving - General surgery followed-recommended to gradually diet. Signed off as of 11/19/2012 input greatly appreciated  -Last portable abdominal x-ray done 11/17/2012 showed no residual ileus  -Passing regular stool now.  Pericardial effusion: s/p pericardial window on 2/28  - ECHO :showed large free flowing pericardial effusion, no tamponade physiology, EF 35-40%, Possible hypokinesis of the mid-distalinferoseptal myocardium-unclear if would benefit from further workup by cardiology-this was discussed with cardiologist on 11/19/12 and a recommended outpatient and not immediate further management - Appreciate CT surgery assistance, s/p Subxiphoid pericardial window and drainage of pericardial effusion.  - Fluid cultures negative since 11/16/2012-tube out 3/1.   Hematemesis/vomiting blood, upper GI bleed: restarted with ileus but improved now  - EGD done on 2/26 showed hiatal hernia, minimal antral gastritis, no bleeding source. GI has signed off.  - CT abdomen and pelvis was done which showed small bilateral pleural effusions, cardiac enlargement with pericardial effusion  - Continue PPI, H/H stable  -  Home medications do not include aspirin or  Plavix therefore will need to investigate the same-potentially stopped because of GI bleeding  -Potentially will need aspirin/Plavix in about 8 weeks, will need gastroenterology input prior to re administering this.   Hypertension, accelerated with tachycardia:  - Converted back from metoprolol IV to regimen  -Developed runs of sustained V. tach 50 beats 11/19/12 a.m. and medications further adjusted to Toprol-XL 200 mg in the morning. Patient still tachycardic 11/20/12 therefore Toprol 50 mg short acting was added the evening dosage of medications. Patient will need further management. -He has been started back on discharge on his amlodipine 10 mg in hydralazine 50 mg 3 times a day -Suspect component of rebound hypertension/tachycardia secondary to withdrawal of clonidine the patient had been on in the past -Given patient is on Celexa which can prolong QT interval, magnesium/phosphorus checked and were normal  Dementia: cont namenda 10 mg, added back 11/18/2012 .  GERD (gastroesophageal reflux disease): on PPI.   Hemiparesis affecting right side as late effect of stroke, Expressive aphasia: Appears to be from the prior stroke  - CT head did not show any acute CVA, PT eval -> will return to SNF when medically clear.   CKD stage 2-3  -improving steadily, d/c IVF and encourage PO intake.  His baseline seems to be 1.5-1.3 and he was discharged with BUN/creatinine 11/1.38   Discharge Exam: Filed Vitals:   11/19/12 1140 11/19/12 1323 11/19/12 2057 11/20/12 0500  BP: 148/85 147/84 154/79 165/83  Pulse: 106 105 85 93  Temp:  97.7 F (36.5 C) 98.1 F (36.7 C) 98 F (36.7 C)  TempSrc:  Oral Oral Oral  Resp:  18 18 18   Weight:    86.365 kg (190 lb 6.4 oz)  SpO2:  96% 98% 96%    Alert pleasant and was having above no specific complaints no chest pain no nausea. States he wished to go  General: Alert Cardiovascular: S1-S2 slightly tachycardic no murmur noted midline pericardial window  scar Respiratory: Clinically clear  Discharge Instructions  Follow-up Information   Follow up with Melvia Heaps, MD In 4 weeks. (? need scope)    Contact information:   [address] [place] [address] [phone]       Follow up with Donato Schultz, MD In 3 weeks. (needs potential ischemic work-up)    Contact information:   [address] [place] [address] [phone]        Discharge Orders   Future Orders Complete By Expires     Call MD for:  hives  As directed     Call MD for:  persistant dizziness or light-headedness  As directed     Call MD for:  persistant nausea and vomiting  As directed     Call MD for:  redness, tenderness, or signs of infection (pain, swelling, redness, odor or green/yellow discharge around incision site)  As directed     Call MD for:  severe uncontrolled pain  As directed     Diet - low sodium heart healthy  As directed     Increase activity slowly  As directed         Medication List    STOP taking these medications       cloNIDine 0.3 MG tablet  Commonly known as:  CATAPRES     labetalol 300 MG tablet  Commonly known as:  NORMODYNE      TAKE these medications  amLODipine 10 MG tablet  Commonly known as:  NORVASC  Take 10 mg by mouth daily.     bisacodyl 5 MG EC tablet  Generic drug:  bisacodyl  Take 15 mg by mouth at bedtime.     citalopram 10 MG tablet  Commonly known as:  CELEXA  Take 5 mg by mouth daily.     docusate sodium 100 MG capsule  Commonly known as:  COLACE  Take 100 mg by mouth 2 (two) times daily.     hydrALAZINE 50 MG tablet  Commonly known as:  APRESOLINE  Take 50 mg by mouth 3 (three) times daily.     lactulose 10 GM/15ML solution  Commonly known as:  CHRONULAC  Take 30 mLs by mouth 2 (two) times daily.     lisinopril 10 MG tablet  Commonly known as:  PRINIVIL,ZESTRIL  Take 1 tablet (10 mg total) by mouth daily.     memantine 10 MG tablet  Commonly known as:  NAMENDA   Take 10 mg by mouth 2 (two) times daily.     metoprolol 200 MG 24 hr tablet  Commonly known as:  TOPROL-XL  Take 1 tablet (200 mg total) by mouth daily. Take with or immediately following a meal.     metoprolol 50 MG tablet  Commonly known as:  LOPRESSOR  Take 1 tablet (50 mg total) by mouth every evening.     omeprazole 20 MG capsule  Commonly known as:  PRILOSEC  Take 20 mg by mouth daily.     simvastatin 40 MG tablet  Commonly known as:  ZOCOR  Take 40 mg by mouth every evening.     tamsulosin 0.4 MG Caps  Commonly known as:  FLOMAX  Take 0.4 mg by mouth daily.          The results of significant diagnostics from this hospitalization (including imaging, microbiology, ancillary and laboratory) are listed below for reference.    Significant Diagnostic Studies: Ct Abdomen Pelvis Wo Contrast  11/11/2012  *RADIOLOGY REPORT*  Clinical Data: Distended abdomen with abdominal pain.  CT ABDOMEN AND PELVIS WITHOUT CONTRAST  Technique:  Multidetector CT imaging of the abdomen and pelvis was performed following the standard protocol without intravenous contrast.  Comparison: 12/10/2009  Findings: Small bilateral pleural effusions with atelectasis or infiltration in both lung bases.  Moderate sized pericardial effusion.  Cardiac enlargement.  Moderate sized esophageal hiatal hernia.  The unenhanced appearance of the liver, spleen, gallbladder, pancreas, adrenal glands, and retroperitoneal lymph nodes is unremarkable.  Mild calcification of the abdominal aorta without aneurysm.  Multiple parenchymal cysts in the right kidney corresponding to the previous study.  Largest is in the upper pole and measures 1.7 cm.  There is a hyper dense lesion in the upper pole of the right kidney posteriorly measuring 7 mm consistent with a hemorrhagic cyst.  No hydronephrosis of the right kidney.  The left kidney demonstrates marked hydronephrosis and ureterectasis with dilated and tortuous aorta down to the  level of the bladder. There is marked parenchymal atrophy in the left kidney suggesting that this represents a longstanding process.  The appearance is similar to the previous study, allowing for technical differences. The stomach, small bowel, and colon are not abnormally distended. No abdominal ascites.  Pelvis:  There is marked distension of the bladder suggesting possible bladder outlet obstruction versus dysmotility.  The prostate gland is not appear to be enlarged.  The appearance is stable since the previous study.  No free or  loculated pelvic fluid collections.  The rectum is decompressed but there is apparent thickening of the wall of the anorectal junction.  Was present previously.  This could represent chronic inflammatory process. Mass lesion can also have this appearance.  The appendix is normal. No diverticulitis.  Lumbar scoliosis convex towards the right.  Degenerative changes in the lumbar spine and hips.  IMPRESSION: Small bilateral pleural effusions with basilar atelectasis or infiltration.  Cardiac enlargement with pericardial effusion. Prominent pyelocaliectasis and ureterectasis of the left kidney with parenchymal atrophy.  Distended bladder without wall thickening.  Thickening of the wall of the anorectal junction. These findings are similar to previous study.  Clinical appearance of abdominal distension is likely to be due to the distended bladder.   Original Report Authenticated By: Burman Nieves, M.D.    Dg Chest 2 View  11/11/2012  *RADIOLOGY REPORT*  Clinical Data: Chest pain.  CHEST - 2 VIEW  Comparison: 10/23/2007 and prior chest radiographs  Findings: Cardiomegaly and peribronchial thickening again noted. Left basilar scarring/density is unchanged. There is no evidence of pleural effusion, pneumothorax or pulmonary edema. No acute bony abnormalities are identified.  IMPRESSION: Cardiomegaly without evidence of acute cardiopulmonary disease.   Original Report Authenticated By:  Harmon Pier, M.D.    Dg Abd 1 View  11/16/2012  *RADIOLOGY REPORT*  Clinical Data: With ileus follow up  ABDOMEN - 1 VIEW  Comparison: Prior abdominal radiographs 11/15/2012  Findings: Single frontal view the abdomen demonstrates a nasogastric tube with the tip in the prepyloric gastric antrum versus proximal duodenum. Slightly decreased gaseous distension of multiple loops of bowel in the left hemiabdomen.  No large free air identified.  Stable advanced dextroconvex scoliosis of the thoracolumbar junction with multilevel degenerative changes throughout the spine.  IMPRESSION:  1.  Slight interval decrease in the underlying ileus pattern. 2.  The tip of nasogastric tube is in the prepyloric antrum or proximal duodenum.   Original Report Authenticated By: Malachy Moan, M.D.    Ct Head Wo Contrast  11/12/2012  *RADIOLOGY REPORT*  Clinical Data: 67 year old male with right hemipareses.  History of dementia, prior infarct  CT HEAD WITHOUT CONTRAST  Technique:  Contiguous axial images were obtained from the base of the skull through the vertex without contrast.  Comparison: None  Findings: Left hemispheric encephalomalacia noted. Chronic small vessel white matter ischemic changes are present. A 1.7 cm calcified area in the left periventricular region is likely chronic. No acute intracranial abnormalities are identified, including mass effect, hydrocephalus, extra-axial fluid collection, midline shift, hemorrhage, or acute infarction.  Left frontotemporal craniectomy noted.  IMPRESSION: No evidence of acute intracranial abnormality.  Left hemispheric encephalomalacia and chronic small vessel white matter ischemic changes.   Original Report Authenticated By: Harmon Pier, M.D.    Dg Chest Port 1 View  11/14/2012  *RADIOLOGY REPORT*  Clinical Data: Shortness of breath.  PORTABLE CHEST - 1 VIEW  Comparison: 11/10/2012  Findings: Very low lung volumes.  There is cardiomegaly with vascular congestion and bibasilar  atelectasis.  Lung volumes have decreased.  No acute bony abnormality or visible effusions.  IMPRESSION: Very low lung volumes.  Cardiomegaly with vascular congestion and bibasilar atelectasis.   Original Report Authenticated By: Charlett Nose, M.D.    Dg Abd 2 Views  11/11/2012  *RADIOLOGY REPORT*  Clinical Data: Abdominal pain and vomiting.  ABDOMEN - 2 VIEW  Comparison: 01/15/2011 radiographs  Findings: Gas filled loops of colon and small bowel identified. No dilated loops of bowel are present. There  is no evidence of pneumoperitoneum. No suspicious calcifications are noted. A severe thoracolumbar scoliosis is again identified.  IMPRESSION: Nonspecific nonobstructive bowel gas pattern - no evidence of pneumoperitoneum.   Original Report Authenticated By: Harmon Pier, M.D.    Dg Abd Portable 1v  11/15/2012  *RADIOLOGY REPORT*  Clinical Data: Evaluate for free air.  PORTABLE ABDOMEN - 1 VIEW  Comparison: Supine abdominal image 11/15/2012.  Findings: Decubitus view demonstrates continued mild diffuse gaseous distention of bowel.  No free air on decubitus view.  IMPRESSION: No free air.   Original Report Authenticated By: Charlett Nose, M.D.    Dg Abd Portable 1v  11/15/2012  *RADIOLOGY REPORT*  Clinical Data: Ileus  PORTABLE ABDOMEN - 1 VIEW  Comparison: 11/14/2012  Findings: Gas distended large and small bowel.  Findings may represent an ileus without significant change from prior examination. Distal obstructing lesion not excluded.  Bowel walls well visualized which may be related to approximation of gas distended loops.  Free air cannot be excluded.  Decubitus view may be considered.  Nasogastric tube may be in place projecting to the right of midline.  Significant scoliosis.  IMPRESSION: Persistent gas distended bowel which may reflect ileus.  Distal obstructing lesion not excluded.  Free air not excluded and decubitus view recommended for further delineation.  Critical Value/emergent results were called by  telephone at the time of interpretation on 11/15/2012 at 8:05 a.m. to [place] Day the patient's nurse , who verbally acknowledged these results.   Original Report Authenticated By: Lacy Duverney, M.D.    Dg Abd Portable 1v  11/14/2012  *RADIOLOGY REPORT*  Clinical Data: Ileus  PORTABLE ABDOMEN - 1 VIEW  Comparison: CT abdomen pelvis dated 11/11/2012  Findings: Gaseous distention of small and large bowel, suggesting diffuse adynamic ileus.  Thoracolumbar scoliosis.  IMPRESSION: Suspected diffuse adynamic ileus.   Original Report Authenticated By: Charline Bills, M.D.    Dg Abd Portable 2v  11/17/2012  *RADIOLOGY REPORT*  Clinical Data: Ileus versus small bowel obstruction, follow-up  PORTABLE ABDOMEN - 2 VIEW  Comparison: Portable exam 12/07 hours compared to 11/16/2012  Findings: Nasogastric tube projects over stomach. Abdominal deformity due to the thoracolumbar scoliosis. Air filled nondistended loops of large and small bowel in abdomen. No definite bowel dilatation, bowel wall thickening, or free intraperitoneal air. Degenerative changes thoracolumbar spine. Left pelvic phleboliths.  IMPRESSION: Nonobstructive bowel gas pattern. No bowel dilatation currently identified.   Original Report Authenticated By: Ulyses Southward, M.D.     Microbiology: Recent Results (from the past 240 hour(s))  MRSA PCR SCREENING     Status: None   Collection Time    11/11/12  1:24 PM      Result Value Range Status   MRSA by PCR NEGATIVE  NEGATIVE Final   Comment:            The GeneXpert MRSA Assay (FDA     approved for NASAL specimens     only), is one component of a     comprehensive MRSA colonization     surveillance program. It is not     intended to diagnose MRSA     infection nor to guide or     monitor treatment for     MRSA infections.  AFB CULTURE WITH SMEAR     Status: None   Collection Time    11/13/12 12:00 PM      Result Value Range Status   Specimen Description FLUID PERICARDIAL   Final   Special  Requests NONE  Final   ACID FAST SMEAR NO ACID FAST BACILLI SEEN   Final   Culture     Final   Value: CULTURE WILL BE EXAMINED FOR 6 WEEKS BEFORE ISSUING A FINAL REPORT   Report Status PENDING   Incomplete  FUNGUS CULTURE W SMEAR     Status: None   Collection Time    11/13/12 12:00 PM      Result Value Range Status   Specimen Description FLUID PERICARDIAL   Final   Special Requests NONE   Final   Fungal Smear NO YEAST OR FUNGAL ELEMENTS SEEN   Final   Culture CULTURE IN PROGRESS FOR FOUR WEEKS   Final   Report Status PENDING   Incomplete  BODY FLUID CULTURE     Status: None   Collection Time    11/13/12 12:00 PM      Result Value Range Status   Specimen Description FLUID PERICARDIAL   Final   Special Requests NONE   Final   Gram Stain     Final   Value: NO WBC SEEN     NO ORGANISMS SEEN   Culture NO GROWTH 3 DAYS   Final   Report Status 11/16/2012 FINAL   Final  CLOSTRIDIUM DIFFICILE BY PCR     Status: None   Collection Time    11/17/12  1:20 PM      Result Value Range Status   C difficile by pcr NEGATIVE  NEGATIVE Final     Labs: Basic Metabolic Panel:  Recent Labs Lab 11/14/12 0501 11/15/12 0550 11/17/12 0915 11/18/12 0111 11/19/12 0830 11/20/12 0705  NA 142 143 147*  --   --  142  K 4.3 4.1 3.4*  --   --  3.9  CL 109 112 111  --   --  109  CO2 21 22 23   --   --  22  GLUCOSE 118* 117* 121*  --   --  105*  BUN 19 18 12   --   --  11  CREATININE 1.51* 1.41* 1.32  --   --  1.38*  CALCIUM 8.6 8.3* 9.3  --   --  8.7  MG  --   --   --  1.5 1.6  --   PHOS  --   --   --   --  2.7  --    Liver Function Tests: No results found for this basename: AST, ALT, ALKPHOS, BILITOT, PROT, ALBUMIN,  in the last 168 hours No results found for this basename: LIPASE, AMYLASE,  in the last 168 hours No results found for this basename: AMMONIA,  in the last 168 hours CBC:  Recent Labs Lab 11/14/12 0501 11/15/12 0550 11/17/12 0915 11/20/12 0705  WBC 9.4 7.9 7.1 4.8  HGB  11.3* 10.0* 11.7* 11.2*  HCT 35.2* 30.6* 36.0* 34.3*  MCV 81.7 81.2 80.7 78.5  PLT 209 203 268 275   Cardiac Enzymes: No results found for this basename: CKTOTAL, CKMB, CKMBINDEX, TROPONINI,  in the last 168 hours BNP: BNP (last 3 results)  Recent Labs  11/12/12 2000  PROBNP 818.8*   CBG: No results found for this basename: GLUCAP,  in the last 168 hours     Signed:  Rhetta Mura  [facility] 11/20/2012, 10:40 AM

## 2012-11-20 NOTE — Progress Notes (Signed)
CSW made arrangements for the pt to return back to [facility]. Ambulance was called to transport pt. Full d/c package is complete. No other needs at this time.   Sherald Barge, LCSW-A Clinical Social Worker [phone]

## 2012-11-22 ENCOUNTER — Encounter (HOSPITAL_COMMUNITY): Payer: Self-pay | Admitting: Emergency Medicine

## 2012-11-22 ENCOUNTER — Emergency Department (HOSPITAL_COMMUNITY): Payer: Medicare Other

## 2012-11-22 ENCOUNTER — Emergency Department (HOSPITAL_COMMUNITY)
Admission: EM | Admit: 2012-11-22 | Discharge: 2012-11-22 | Disposition: A | Discharge status: Skilled Nursing Facility | Payer: Medicare Other | Source: Home / Self Care | Attending: Emergency Medicine | Admitting: Emergency Medicine

## 2012-11-22 DIAGNOSIS — Z87448 Personal history of other diseases of urinary system: Secondary | ICD-10-CM | POA: Insufficient documentation

## 2012-11-22 DIAGNOSIS — I10 Essential (primary) hypertension: Secondary | ICD-10-CM | POA: Insufficient documentation

## 2012-11-22 DIAGNOSIS — R109 Unspecified abdominal pain: Secondary | ICD-10-CM | POA: Insufficient documentation

## 2012-11-22 DIAGNOSIS — F0390 Unspecified dementia without behavioral disturbance: Secondary | ICD-10-CM | POA: Insufficient documentation

## 2012-11-22 DIAGNOSIS — E785 Hyperlipidemia, unspecified: Secondary | ICD-10-CM | POA: Insufficient documentation

## 2012-11-22 DIAGNOSIS — Z79899 Other long term (current) drug therapy: Secondary | ICD-10-CM | POA: Insufficient documentation

## 2012-11-22 DIAGNOSIS — Z8673 Personal history of transient ischemic attack (TIA), and cerebral infarction without residual deficits: Secondary | ICD-10-CM | POA: Insufficient documentation

## 2012-11-22 DIAGNOSIS — K219 Gastro-esophageal reflux disease without esophagitis: Secondary | ICD-10-CM | POA: Insufficient documentation

## 2012-11-22 LAB — CBC WITH DIFFERENTIAL/PLATELET
Basophils Absolute: 0 10*3/uL (ref 0.0–0.1)
Eosinophils Relative: 2 % (ref 0–5)
Lymphocytes Relative: 10 % — ABNORMAL LOW (ref 12–46)
Lymphs Abs: 0.8 10*3/uL (ref 0.7–4.0)
MCV: 80.2 fL (ref 78.0–100.0)
Neutro Abs: 6.5 10*3/uL (ref 1.7–7.7)
Neutrophils Relative %: 79 % — ABNORMAL HIGH (ref 43–77)
Platelets: 352 10*3/uL (ref 150–400)
RBC: 4.7 MIL/uL (ref 4.22–5.81)
RDW: 14.5 % (ref 11.5–15.5)
WBC: 8.3 10*3/uL (ref 4.0–10.5)

## 2012-11-22 LAB — URINALYSIS, ROUTINE W REFLEX MICROSCOPIC
Bilirubin Urine: NEGATIVE
Glucose, UA: NEGATIVE mg/dL
Protein, ur: NEGATIVE mg/dL
Specific Gravity, Urine: 1.018 (ref 1.005–1.030)

## 2012-11-22 LAB — COMPREHENSIVE METABOLIC PANEL
ALT: 12 U/L (ref 0–53)
AST: 18 U/L (ref 0–37)
Alkaline Phosphatase: 59 U/L (ref 39–117)
CO2: 20 mEq/L (ref 19–32)
Calcium: 10.1 mg/dL (ref 8.4–10.5)
GFR calc Af Amer: 39 mL/min — ABNORMAL LOW (ref >90)
GFR calc non Af Amer: 33 mL/min — ABNORMAL LOW (ref >90)
Glucose, Bld: 114 mg/dL — ABNORMAL HIGH (ref 70–99)
Potassium: 4.8 mEq/L (ref 3.5–5.1)
Sodium: 142 mEq/L (ref 135–145)

## 2012-11-22 LAB — POCT I-STAT TROPONIN I

## 2012-11-22 LAB — TROPONIN I: Troponin I: <0.3 ng/mL (ref <0.30)

## 2012-11-22 LAB — URINE MICROSCOPIC-ADD ON

## 2012-11-22 MED ORDER — METOPROLOL TARTRATE 25 MG PO TABS
50.0000 mg | ORAL_TABLET | Freq: Once | ORAL | Status: AC
  Administered 2012-11-22: 50 mg via ORAL
  Filled 2012-11-22: qty 2

## 2012-11-22 MED ORDER — SODIUM CHLORIDE 0.9 % IV BOLUS (SEPSIS)
500.0000 mL | INTRAVENOUS | Status: AC
  Administered 2012-11-22: 500 mL via INTRAVENOUS

## 2012-11-22 NOTE — ED Provider Notes (Addendum)
Medical screening examination/treatment/procedure(s) were conducted as a shared visit with non-physician practitioner(s) and myself.  I personally evaluated the patient during the encounter Patient with multiple medical problems including stroke, pericardial effusion with a recent pericardial window with a history of hypertension and tachycardia. Also was recently admitted approximately 2-1/2 weeks ago for possible GI bleed. He had an EGD that showed mild antral gastritis without any sign of ulcers or active bleeding. He is a CT done at that time which showed no signs of active bleeding. He was sent by his nursing home today for one episode of what looked like coffee ground emesis and 2 large stools. His stool color is normal in appearance despite being Hemoccult positive. He is currently not taking any aspirin products which was DC'd during his last hospitalization. Patient has no abdominal tenderness on exam and when asked where he hurts he points to his scar from the pericardial window. Patient's hemoglobin is within normal limits today in improved from prior. CMP with mild elevation of creatinine but otherwise normal. Patient was discussed with internal medicine who did not have any further recommendations. His chest x-ray showed no worsening in his pericardial effusion from prior which was thought to be due from chronic cardiomyopathy. Prior to discharge patient came tachycardic from 110-120. EKG was done that showed sinus tachycardia without any acute changes. He is to give metoprolol 50 mg at night which he had not had his evening dose. He was given his evening dose in his heart rate improved to 100. Nursing home is notified and he will have repeat labs done on Wednesday he was discharged in stable condition. He was able T. applesauce and a sandwich here without any vomiting  Gwyneth Sprout, MD 11/22/12 7829  Gwyneth Sprout, MD 11/22/12 2321

## 2012-11-22 NOTE — ED Notes (Signed)
Per EMS - pt coming from [facility] c/o abd pain. Staff reports pt has had 2 large loose stools then vomited X 1 that was dark coffee brown emesis. Pt tender to LUQ. LBBB on monitor. BP 148/92 HR 130 RR 16. Pt has hx of stroke with right sided weakness/parlysis. Pt has fresh scar on abd, approx 5inches long. Pt a&ox4.

## 2012-11-22 NOTE — ED Notes (Signed)
[facility] PAGED FOR TRANSPORT

## 2012-11-22 NOTE — ED Notes (Signed)
Attempted to collect urine, pt unable to void.  

## 2012-11-22 NOTE — ED Notes (Signed)
Attempted IV start x 2.  Unable to gain access, but was able to draw approximately 3 mL of blood for lab.  Having a 2nd RN assess for an IV.

## 2012-11-22 NOTE — ED Provider Notes (Signed)
History     CSN: [id number]  Arrival date & time 11/22/12  1508   First MD Initiated Contact with Patient 11/22/12 1515      Chief Complaint  Patient presents with  . Abdominal Pain    (Consider location/radiation/quality/duration/timing/severity/associated sxs/prior treatment) HPI Comments: This is a 67 year old male, past medical history remarkable for dementia, stroke, hydronephrosis, hypertension, hyperlipidemia, GERD, who presents emergency department with chief complaint of abdominal pain. Patient is a poor historian due to dementia, recently seen for similar, but no active bleeding was seen on endoscopies.  Patient states that he feels nauseated, and complains of upper abdominal pain.  The pain is moderate in severity.  It does not radiate.  He has a history of stroke with right sided weakness/paralysis.  Recent pericardial window.  Patient states that he is hungry and bedside.    The history is provided by the patient. No language interpreter was used.    Past Medical History  Diagnosis Date  . Dementia   . Stroke   . Hydronephrosis   . Hypertension   . Hyperlipemia   . GERD (gastroesophageal reflux disease)   . BPH (benign prostatic hyperplasia)     Past Surgical History  Procedure Laterality Date  . Esophagogastroduodenoscopy N/A 11/11/2012    Procedure: ESOPHAGOGASTRODUODENOSCOPY (EGD);  Surgeon: [name], MD;  Location: [facility] ENDOSCOPY;  Service: Endoscopy;  Laterality: N/A;  . Subxyphoid pericardial window N/A 11/13/2012    Procedure: SUBXYPHOID PERICARDIAL WINDOW;  Surgeon: Delight Ovens, MD;  Location: [facility] OR;  Service: Thoracic;  Laterality: N/A;    No family history on file.  History  Substance Use Topics  . Smoking status: Never Smoker   . Smokeless tobacco: Never Used  . Alcohol Use: No      Review of Systems  All other systems reviewed and are negative.    Allergies  Review of patient's allergies indicates no known allergies.  Home  Medications   Current Outpatient Rx  Name  Route  Sig  Dispense  Refill  . amLODipine (NORVASC) 10 MG tablet   Oral   Take 10 mg by mouth daily.         . bisacodyl (BISACODYL) 5 MG EC tablet   Oral   Take 15 mg by mouth at bedtime.         . citalopram (CELEXA) 10 MG tablet   Oral   Take 5 mg by mouth daily.         Marland Kitchen docusate sodium (COLACE) 100 MG capsule   Oral   Take 100 mg by mouth 2 (two) times daily.         . hydrALAZINE (APRESOLINE) 50 MG tablet   Oral   Take 50 mg by mouth 3 (three) times daily.         Marland Kitchen lactulose (CHRONULAC) 10 GM/15ML solution   Oral   Take 30 mLs by mouth 2 (two) times daily.         Marland Kitchen lisinopril (PRINIVIL,ZESTRIL) 10 MG tablet   Oral   Take 1 tablet (10 mg total) by mouth daily.         . memantine (NAMENDA) 10 MG tablet   Oral   Take 10 mg by mouth 2 (two) times daily.         . metoprolol (LOPRESSOR) 50 MG tablet   Oral   Take 50 mg by mouth every evening.         . metoprolol succinate (TOPROL-XL) 200 MG  24 hr tablet   Oral   Take 1 tablet (200 mg total) by mouth daily. Take with or immediately following a meal.   30 tablet   0   . omeprazole (PRILOSEC) 20 MG capsule   Oral   Take 20 mg by mouth daily.         . simvastatin (ZOCOR) 40 MG tablet   Oral   Take 40 mg by mouth every evening.         . tamsulosin (FLOMAX) 0.4 MG CAPS   Oral   Take 0.4 mg by mouth daily.           BP 154/88  Pulse 107  Temp(Src) 97.6 F (36.4 C) (Oral)  Resp 20  SpO2 96%  Physical Exam  Nursing note and vitals reviewed. Constitutional: He is oriented to person, place, and time. He appears well-developed and well-nourished. No distress.  HENT:  Head: Normocephalic and atraumatic.  Eyes: Conjunctivae and EOM are normal. Pupils are equal, round, and reactive to light. Right eye exhibits no discharge. Left eye exhibits no discharge. No scleral icterus.  Neck: Normal range of motion. Neck supple. No JVD present.   Cardiovascular: Normal rate, regular rhythm, normal heart sounds and intact distal pulses.  Exam reveals no gallop and no friction rub.   No murmur heard. Pulmonary/Chest: Effort normal and breath sounds normal. No respiratory distress. He has no wheezes. He has no rales. He exhibits no tenderness.  Abdominal: Soft. Bowel sounds are normal. He exhibits no distension and no mass. There is no tenderness. There is no rebound and no guarding.  Scar on upper abdomen from recent pericardial window, no other focal abdominal tenderness.  Genitourinary: Guaiac positive stool.  Normal rectum on exam, no internal or external hemorrhoids, no fissures, no abscesses, no gross blood, soft brown stool in the rectal vault however guaiac is positive  Musculoskeletal: Normal range of motion. He exhibits no edema and no tenderness.  Right-sided weakness/paralysis, unable to move upper or lower extremities, this is not new  Neurological: He is alert and oriented to person, place, and time. He has normal reflexes.  Skin: Skin is warm and dry.  Psychiatric: He has a normal mood and affect. His behavior is normal. Judgment and thought content normal.    ED Course  Procedures (including critical care time)  Labs Reviewed  OCCULT BLOOD X 1 CARD TO LAB, STOOL  CBC WITH DIFFERENTIAL  COMPREHENSIVE METABOLIC PANEL  OCCULT BLOOD GASTRIC / DUODENUM (SPECIMEN CUP)   No results found.  ED ECG REPORT  I personally interpreted this EKG   Date: 11/22/2012   Rate: 122  Rhythm: sinus tachycardia  QRS Axis: indeterminate  Intervals: normal  ST/T Wave abnormalities: normal  Conduction Disutrbances:none  Narrative Interpretation:   Old EKG Reviewed: none available    1. Abdominal  pain, other specified site       MDM    4:00 PM Patient seen by and discussed with Dr. Anitra Lauth.  Patient is not in any apparent distress.  He is sitting up in bed, and requesting food.  As there was no bleeding found on prior  hospital stay, doubt serious GI bleed with "coffee ground emesis," as reported by care home staff.  4:32 PM FOBT is positive.  Notified Dr. Anitra Lauth.  VSS.  Will wait for labs to result, and likely admit for GI bleed.  4:52 PM Elevated I-stat troponin, Dr. Anitra Lauth notified, will order regular troponin.  Patient is not having chest pain,  diaphoresis, neck pain, or SOB.   10:02 PM Patient discussed at length with Dr. Anitra Lauth, who also saw the patient and discussed him with the hospitalists.  It was determined that as the patient had no active bleeding on his last admission, he is well-appearing here, and his hemoglobin is improved, the patient may be discharged back to his care facility.  Will have the patient repeat labs on Wednesday to ensure that his H/H remains normal.  Patient had some tachycardia, but attribute this to the patient having not received his metoprolol.  The tachycardia went back to baseline with metoprolol.   Patient is stable and ready for discharge.  Dr. Anitra Lauth has been heavily involved in the MDM on this patient and agrees with the plan.       Roxy Horseman, PA-C 11/22/12 2206

## 2012-11-23 ENCOUNTER — Emergency Department (HOSPITAL_COMMUNITY): Payer: Medicare Other

## 2012-11-23 ENCOUNTER — Inpatient Hospital Stay (HOSPITAL_COMMUNITY)
Admission: EM | Admit: 2012-11-23 | Discharge: 2012-11-27 | DRG: 378 | Disposition: A | Discharge status: Skilled Nursing Facility | Payer: Medicare Other | Attending: Internal Medicine | Admitting: Internal Medicine

## 2012-11-23 DIAGNOSIS — K920 Hematemesis: Principal | ICD-10-CM

## 2012-11-23 DIAGNOSIS — I129 Hypertensive chronic kidney disease with stage 1 through stage 4 chronic kidney disease, or unspecified chronic kidney disease: Secondary | ICD-10-CM | POA: Diagnosis present

## 2012-11-23 DIAGNOSIS — F0390 Unspecified dementia without behavioral disturbance: Secondary | ICD-10-CM

## 2012-11-23 DIAGNOSIS — K3189 Other diseases of stomach and duodenum: Secondary | ICD-10-CM | POA: Diagnosis present

## 2012-11-23 DIAGNOSIS — I69959 Hemiplegia and hemiparesis following unspecified cerebrovascular disease affecting unspecified side: Secondary | ICD-10-CM

## 2012-11-23 DIAGNOSIS — N189 Chronic kidney disease, unspecified: Secondary | ICD-10-CM | POA: Diagnosis present

## 2012-11-23 DIAGNOSIS — I313 Pericardial effusion (noninflammatory): Secondary | ICD-10-CM | POA: Diagnosis present

## 2012-11-23 DIAGNOSIS — I498 Other specified cardiac arrhythmias: Secondary | ICD-10-CM | POA: Diagnosis present

## 2012-11-23 DIAGNOSIS — Z79899 Other long term (current) drug therapy: Secondary | ICD-10-CM

## 2012-11-23 DIAGNOSIS — R112 Nausea with vomiting, unspecified: Secondary | ICD-10-CM

## 2012-11-23 DIAGNOSIS — K219 Gastro-esophageal reflux disease without esophagitis: Secondary | ICD-10-CM | POA: Diagnosis present

## 2012-11-23 DIAGNOSIS — E785 Hyperlipidemia, unspecified: Secondary | ICD-10-CM | POA: Diagnosis present

## 2012-11-23 DIAGNOSIS — D638 Anemia in other chronic diseases classified elsewhere: Secondary | ICD-10-CM | POA: Diagnosis present

## 2012-11-23 DIAGNOSIS — I69351 Hemiplegia and hemiparesis following cerebral infarction affecting right dominant side: Secondary | ICD-10-CM

## 2012-11-23 DIAGNOSIS — I10 Essential (primary) hypertension: Secondary | ICD-10-CM | POA: Diagnosis present

## 2012-11-23 DIAGNOSIS — N183 Chronic kidney disease, stage 3 unspecified: Secondary | ICD-10-CM | POA: Diagnosis present

## 2012-11-23 DIAGNOSIS — K922 Gastrointestinal hemorrhage, unspecified: Secondary | ICD-10-CM

## 2012-11-23 DIAGNOSIS — R4701 Aphasia: Secondary | ICD-10-CM

## 2012-11-23 DIAGNOSIS — R1013 Epigastric pain: Secondary | ICD-10-CM | POA: Diagnosis present

## 2012-11-23 DIAGNOSIS — N179 Acute kidney failure, unspecified: Secondary | ICD-10-CM

## 2012-11-23 DIAGNOSIS — K208 Other esophagitis without bleeding: Secondary | ICD-10-CM | POA: Diagnosis present

## 2012-11-23 DIAGNOSIS — N400 Enlarged prostate without lower urinary tract symptoms: Secondary | ICD-10-CM | POA: Diagnosis present

## 2012-11-23 DIAGNOSIS — I639 Cerebral infarction, unspecified: Secondary | ICD-10-CM | POA: Diagnosis present

## 2012-11-23 MED ORDER — ONDANSETRON HCL 4 MG/2ML IJ SOLN
4.0000 mg | Freq: Once | INTRAMUSCULAR | Status: AC
  Administered 2012-11-23: 4 mg via INTRAVENOUS
  Filled 2012-11-23: qty 2

## 2012-11-23 MED ORDER — SODIUM CHLORIDE 0.9 % IV SOLN
INTRAVENOUS | Status: DC
Start: 2012-11-23 — End: 2012-11-24
  Administered 2012-11-24: 01:00:00 via INTRAVENOUS

## 2012-11-23 MED ORDER — FENTANYL CITRATE 0.05 MG/ML IJ SOLN
50.0000 ug | Freq: Once | INTRAMUSCULAR | Status: AC
  Administered 2012-11-23: 50 ug via INTRAVENOUS
  Filled 2012-11-23: qty 2

## 2012-11-23 NOTE — ED Notes (Signed)
Per EMS: pt coming from [facility] nursing home with c/o chest, abdominal pain, n/v. Pt has vomited x5. Pt is A&O to his norm, skin warm and dry, respirations equal and unlabored.

## 2012-11-24 ENCOUNTER — Inpatient Hospital Stay (HOSPITAL_COMMUNITY): Payer: Medicare Other

## 2012-11-24 ENCOUNTER — Encounter (HOSPITAL_COMMUNITY): Payer: Self-pay | Admitting: *Deleted

## 2012-11-24 DIAGNOSIS — I69959 Hemiplegia and hemiparesis following unspecified cerebrovascular disease affecting unspecified side: Secondary | ICD-10-CM

## 2012-11-24 DIAGNOSIS — N179 Acute kidney failure, unspecified: Secondary | ICD-10-CM | POA: Diagnosis present

## 2012-11-24 DIAGNOSIS — R4701 Aphasia: Secondary | ICD-10-CM

## 2012-11-24 LAB — COMPREHENSIVE METABOLIC PANEL
ALT: 10 U/L (ref 0–53)
Albumin: 3.1 g/dL — ABNORMAL LOW (ref 3.5–5.2)
Alkaline Phosphatase: 48 U/L (ref 39–117)
Potassium: 4 mEq/L (ref 3.5–5.1)
Sodium: 141 mEq/L (ref 135–145)
Total Protein: 6.8 g/dL (ref 6.0–8.3)

## 2012-11-24 LAB — BASIC METABOLIC PANEL
Calcium: 8.9 mg/dL (ref 8.4–10.5)
Creatinine, Ser: 2.12 mg/dL — ABNORMAL HIGH (ref 0.50–1.35)
GFR calc non Af Amer: 31 mL/min — ABNORMAL LOW (ref >90)
Glucose, Bld: 139 mg/dL — ABNORMAL HIGH (ref 70–99)
Sodium: 144 mEq/L (ref 135–145)

## 2012-11-24 LAB — OCCULT BLOOD, POC DEVICE: Fecal Occult Bld: POSITIVE — AB

## 2012-11-24 LAB — POCT I-STAT, CHEM 8
BUN: 36 mg/dL — ABNORMAL HIGH (ref 6–23)
Creatinine, Ser: 1.9 mg/dL — ABNORMAL HIGH (ref 0.50–1.35)
Hemoglobin: 11.2 g/dL — ABNORMAL LOW (ref 13.0–17.0)
Potassium: 4 mEq/L (ref 3.5–5.1)
Sodium: 143 mEq/L (ref 135–145)

## 2012-11-24 LAB — HEMOGLOBIN AND HEMATOCRIT, BLOOD
HCT: 30.7 % — ABNORMAL LOW (ref 39.0–52.0)
HCT: 30.7 % — ABNORMAL LOW (ref 39.0–52.0)
Hemoglobin: 9.6 g/dL — ABNORMAL LOW (ref 13.0–17.0)

## 2012-11-24 LAB — CG4 I-STAT (LACTIC ACID): Lactic Acid, Venous: 0.88 mmol/L (ref 0.5–2.2)

## 2012-11-24 LAB — CBC
MCHC: 32.9 g/dL (ref 30.0–36.0)
Platelets: 384 10*3/uL (ref 150–400)
RDW: 14.7 % (ref 11.5–15.5)

## 2012-11-24 LAB — TYPE AND SCREEN: ABO/RH(D): B POS

## 2012-11-24 MED ORDER — ONDANSETRON HCL 4 MG PO TABS: 4.0000 mg | ORAL_TABLET | Freq: Four times a day (QID) | ORAL | Status: DC | PRN

## 2012-11-24 MED ORDER — SODIUM CHLORIDE 0.9 % IV SOLN
8.0000 mg/h | INTRAVENOUS | Status: DC
  Administered 2012-11-24 (×2): 8 mg/h via INTRAVENOUS
  Filled 2012-11-24 (×6): qty 80

## 2012-11-24 MED ORDER — SODIUM CHLORIDE 0.9 % IV SOLN: INTRAVENOUS | Status: DC

## 2012-11-24 MED ORDER — SODIUM CHLORIDE 0.9 % IJ SOLN
3.0000 mL | Freq: Two times a day (BID) | INTRAMUSCULAR | Status: DC
  Administered 2012-11-26: 3 mL via INTRAVENOUS

## 2012-11-24 MED ORDER — IOHEXOL 300 MG/ML  SOLN: 20.0000 mL | INTRAMUSCULAR | Status: DC

## 2012-11-24 MED ORDER — PANTOPRAZOLE SODIUM 40 MG IV SOLR
40.0000 mg | Freq: Once | INTRAVENOUS | Status: AC
  Administered 2012-11-24: 40 mg via INTRAVENOUS
  Filled 2012-11-24: qty 40

## 2012-11-24 MED ORDER — SODIUM CHLORIDE 0.9 % IV SOLN: INTRAVENOUS | Status: AC

## 2012-11-24 MED ORDER — ONDANSETRON HCL 4 MG/2ML IJ SOLN
INTRAMUSCULAR | Status: AC
  Administered 2012-11-24: 4 mg via INTRAVENOUS
  Filled 2012-11-24: qty 2

## 2012-11-24 MED ORDER — ONDANSETRON HCL 4 MG/2ML IJ SOLN: 4.0000 mg | Freq: Once | INTRAMUSCULAR | Status: AC

## 2012-11-24 MED ORDER — ONDANSETRON HCL 4 MG/2ML IJ SOLN: 4.0000 mg | Freq: Four times a day (QID) | INTRAMUSCULAR | Status: DC | PRN

## 2012-11-24 NOTE — ED Provider Notes (Signed)
History     CSN: [id number]  Arrival date & time 11/23/12  2305   First MD Initiated Contact with Patient 11/23/12 2315      Chief Complaint  Patient presents with  . Chest Pain  . Emesis  . Abdominal Pain    (Consider location/radiation/quality/duration/timing/severity/associated sxs/prior treatment) HPI HX per PT, nursing home report and old records - h/o dementia and CVA, recent admit UGIB, from [facility] with persistent CP and N/V coffee ground emesis, phenergan PTA and zofran in route with persistent emesis in the ED, no gross blood.  PT aslo c/o CP and ABD pain, no rectal bleeding reported and PT unable to say if he has any. PT evaluated here yesterday with similar complaints and discharged back to [facility]. PT also had recent pericardial window for pericardial effusion  Past Medical History  Diagnosis Date  . Dementia   . Stroke   . Hydronephrosis   . Hypertension   . Hyperlipemia   . GERD (gastroesophageal reflux disease)   . BPH (benign prostatic hyperplasia)     Past Surgical History  Procedure Laterality Date  . Esophagogastroduodenoscopy N/A 11/11/2012    Procedure: ESOPHAGOGASTRODUODENOSCOPY (EGD);  Surgeon: [name], MD;  Location: [facility] ENDOSCOPY;  Service: Endoscopy;  Laterality: N/A;  . Subxyphoid pericardial window N/A 11/13/2012    Procedure: SUBXYPHOID PERICARDIAL WINDOW;  Surgeon: Delight Ovens, MD;  Location: [facility] OR;  Service: Thoracic;  Laterality: N/A;    No family history on file.  History  Substance Use Topics  . Smoking status: Never Smoker   . Smokeless tobacco: Never Used  . Alcohol Use: No      Review of Systems  Constitutional: Positive for fever.  HENT: Negative for neck pain and neck stiffness.   Eyes: Negative for visual disturbance.  Respiratory: Positive for shortness of breath.   Cardiovascular: Positive for chest pain.  Gastrointestinal: Positive for nausea, vomiting and abdominal pain. Negative for blood in stool.   Genitourinary: Negative for dysuria.  Musculoskeletal: Negative for back pain.  Skin: Negative for rash.  Neurological: Negative for headaches.  All other systems reviewed and are negative.    Allergies  Review of patient's allergies indicates no known allergies.  Home Medications   Current Outpatient Rx  Name  Route  Sig  Dispense  Refill  . amLODipine (NORVASC) 10 MG tablet   Oral   Take 10 mg by mouth daily.         . bisacodyl (BISACODYL) 5 MG EC tablet   Oral   Take 15 mg by mouth at bedtime.         . citalopram (CELEXA) 10 MG tablet   Oral   Take 5 mg by mouth daily.         Marland Kitchen docusate sodium (COLACE) 100 MG capsule   Oral   Take 100 mg by mouth 2 (two) times daily.         . hydrALAZINE (APRESOLINE) 50 MG tablet   Oral   Take 50 mg by mouth 3 (three) times daily.         Marland Kitchen lactulose (CHRONULAC) 10 GM/15ML solution   Oral   Take 30 mLs by mouth 2 (two) times daily.         Marland Kitchen lisinopril (PRINIVIL,ZESTRIL) 10 MG tablet   Oral   Take 1 tablet (10 mg total) by mouth daily.         . memantine (NAMENDA) 10 MG tablet   Oral   Take  10 mg by mouth 2 (two) times daily.         . metoprolol (LOPRESSOR) 50 MG tablet   Oral   Take 50 mg by mouth every evening.         . metoprolol succinate (TOPROL-XL) 200 MG 24 hr tablet   Oral   Take 1 tablet (200 mg total) by mouth daily. Take with or immediately following a meal.   30 tablet   0   . omeprazole (PRILOSEC) 20 MG capsule   Oral   Take 20 mg by mouth daily.         . ondansetron (ZOFRAN) 4 MG tablet   Oral   Take 4 mg by mouth once.         . simvastatin (ZOCOR) 40 MG tablet   Oral   Take 40 mg by mouth every evening.         . tamsulosin (FLOMAX) 0.4 MG CAPS   Oral   Take 0.4 mg by mouth daily.           BP 155/79  Pulse 120  Temp(Src) 100.3 F (37.9 C)  Resp 26  SpO2 100%  Physical Exam  Constitutional: He appears well-developed and well-nourished.  HENT:   Head: Normocephalic and atraumatic.  Eyes: EOM are normal. Pupils are equal, round, and reactive to light.  Neck: Neck supple.  Cardiovascular: Regular rhythm and intact distal pulses.   tachycardic  Pulmonary/Chest: Effort normal and breath sounds normal. No stridor. No respiratory distress. He has no wheezes. He has no rales.  Abdominal: Soft. Bowel sounds are normal. He exhibits no distension. There is no tenderness. There is no rebound.  Musculoskeletal: Normal range of motion. He exhibits no edema.  Lymphadenopathy:    He has no cervical adenopathy.  Neurological:  Awake, alert, baseline stroke deficits  Skin: Skin is warm and dry.    ED Course  CENTRAL LINE Date/Time: 11/24/2012 12:01 AM Performed by: Sunnie Nielsen Authorized by: Sunnie Nielsen Consent: Verbal consent obtained. written consent obtained. Risks and benefits: risks, benefits and alternatives were discussed Consent given by: patient Patient understanding: patient states understanding of the procedure being performed Patient consent: the patient's understanding of the procedure matches consent given Procedure consent: procedure consent matches procedure scheduled Required items: required blood products, implants, devices, and special equipment available Patient identity confirmed: arm band Time out: Immediately prior to procedure a "time out" was called to verify the correct patient, procedure, equipment, support staff and site/side marked as required. Indications: vascular access Anesthesia: local infiltration Local anesthetic: lidocaine 1% without epinephrine Anesthetic total: 2 ml Preparation: skin prepped with 2% chlorhexidine Skin prep agent dried: skin prep agent completely dried prior to procedure Sterile barriers: all five maximum sterile barriers used - cap, mask, sterile gown, sterile gloves, and large sterile sheet Hand hygiene: hand hygiene performed prior to central venous catheter insertion Location  details: right femoral Site selection rationale: vomiting and prior stroke  Patient position: flat Catheter type: triple lumen Pre-procedure: landmarks identified Ultrasound guidance: yes Number of attempts: 1 Successful placement: yes Post-procedure: line sutured and dressing applied Assessment: blood return through all ports Patient tolerance: Patient tolerated the procedure well with no immediate complications.   (including critical care time)  Labs Reviewed  CBC - Abnormal; Notable for the following:    WBC 11.2 (*)    RBC 4.09 (*)    Hemoglobin 10.6 (*)    HCT 32.2 (*)    MCH 25.9 (*)  All other components within normal limits  POCT I-STAT, CHEM 8 - Abnormal; Notable for the following:    BUN 36 (*)    Creatinine, Ser 1.90 (*)    Glucose, Bld 162 (*)    Hemoglobin 11.2 (*)    HCT 33.0 (*)    All other components within normal limits  OCCULT BLOOD, POC DEVICE - Abnormal; Notable for the following:    Fecal Occult Bld POSITIVE (*)    All other components within normal limits  COMPREHENSIVE METABOLIC PANEL  LIPASE, BLOOD  TROPONIN I  CG4 I-STAT (LACTIC ACID)   Dg Abd Acute W/chest  11/22/2012  *RADIOLOGY REPORT*  Clinical Data: Abdominal pain.  Possible GI bleed.  ACUTE ABDOMEN SERIES (ABDOMEN 2 VIEW & CHEST 1 VIEW)  Comparison: PA and lateral chest 11/10/2012.  Two views of the abdomen 11/17/2012. CT abdomen and pelvis 11/11/2012.  Findings: There is marked enlargement cardiopericardial silhouette. Note is made the patient had a large pericardial effusion on the comparison CT scan.  Left basilar atelectasis is noted.  Right lung is clear.  Two views the abdomen show no free intraperitoneal air.  There is gaseous distention of the colon throughout.  Severe convex right scoliosis is noted.  IMPRESSION:  1.  Enlarged cardiopericardial silhouette is stable in appearance. Note is made the patient pericardial effusion and on the comparison CT. 2.  Gaseous distention of the colon  compatible with ileus. Negative for free air.   Original Report Authenticated By: Holley Dexter, M.D.     Date: 11/24/2012  Rate: 129  Rhythm: sinus tachycardia  QRS Axis: right  Intervals: normal  ST/T Wave abnormalities: nonspecific ST/T changes  Conduction Disutrbances:left bundle branch block  Narrative Interpretation:   Old EKG Reviewed: unchanged   IVFs, IV Fentanyl, protonix drip  Medicine consult obtained and discussed with Dr. Onalee Hua - NG tube placed and plan medical admission.   MDM  Persistent vomiting coffee-ground emesis with history of recent upper GI bleed.  Guaiac positive stools. Vomiting despite multiple rounds of antibiotics, placed on Protonix drip.  Evaluated with EKG, imaging and labs reviewed as above  Central line placed for no peripheral IV access      Sunnie Nielsen, MD 11/24/12 [phone]

## 2012-11-24 NOTE — Progress Notes (Signed)
3:27 PM I agree with HPI/GPe and A/P per Dr. Onalee Hua  I know this patient well from recent admission He has been seen x 2 in ED and was admitted last pm with recurrent apparent GIB        Patient Active Problem List  Diagnosis  . Dementia  . Stroke  . Hypertension  . GERD (gastroesophageal reflux disease)  . Hematemesis/vomiting blood  . UGIB (upper gastrointestinal bleed)  . 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 Expressive aphasia  . Hypertension, accelerated  . Pericardial effusion  . Ileus, postoperative  . Acute-on-chronic renal failure   I have contacted Dr. Randa Evens of GI to see this patient and the POC is to get a rpt Endoscpy, however there is no way of getting in touch with family to get consent-called nursing home and got Family phone numbers [phone]  I will change his Blood draws to q12 hourly given it has been difficult to get a sample and his relative stability Continue IVF given NPO  Blood sugars stable 114-139, start insulin only if above 200  Pleas Koch, MD [facility] [phone]

## 2012-11-24 NOTE — Consult Note (Addendum)
[facility] Reason for consult: 67 year old African-American male who is in the hospital last month. He had a complicated course that included. pericarditis. He had a pericardial window. He has a history of GERD and had hematemesis. This resulted in EGD by Dr. Bosie Clos which hiatal hernia with no other significant findings. It was felt the patient may need a repeat EGD for unclear reasons. He is readmitted with hematemesis. His other problems are history of stroke, dementia expressive aphasia hypertension. Patient currently denies any abdominal pain. He was on omeprazole and apparently was taking that on the submission. Hemoglobin is 10.8 Referring Physician: [facility]     Past Medical History  Diagnosis Date  . Dementia   . Stroke   . Hydronephrosis   . Hypertension   . Hyperlipemia   . GERD (gastroesophageal reflux disease)   . BPH (benign prostatic hyperplasia)     Past Surgical History  Procedure Laterality Date  . Esophagogastroduodenoscopy N/A 11/11/2012    Procedure: ESOPHAGOGASTRODUODENOSCOPY (EGD);  Surgeon: [name], MD;  Location: [facility] ENDOSCOPY;  Service: Endoscopy;  Laterality: N/A;  . Subxyphoid pericardial window N/A 11/13/2012    Procedure: SUBXYPHOID PERICARDIAL WINDOW;  Surgeon: Delight Ovens, MD;  Location: [facility] OR;  Service: Thoracic;  Laterality: N/A;    History reviewed. No pertinent family history.  Social History:  reports that he has never smoked. He has never used smokeless tobacco. He reports that he does not drink alcohol or use illicit drugs.  Allergies: No Known Allergies  Medications; . sodium chloride  3 mL Intravenous Q12H   PRN Meds ondansetron (ZOFRAN) IV, ondansetron Results for orders placed during the hospital encounter of 11/23/12 (from the past 48 hour(s))  CBC     Status: Abnormal   Collection Time    11/24/12 12:04 AM      Result Value Range   WBC 11.2 (*) 4.0 - 10.5 K/uL   RBC 4.09 (*) 4.22 -  5.81 MIL/uL   Hemoglobin 10.6 (*) 13.0 - 17.0 g/dL   HCT 96.0 (*) 45.4 - 09.8 %   MCV 78.7  78.0 - 100.0 fL   MCH 25.9 (*) 26.0 - 34.0 pg   MCHC 32.9  30.0 - 36.0 g/dL   RDW 11.9  14.7 - 82.9 %   Platelets 384  150 - 400 K/uL  COMPREHENSIVE METABOLIC PANEL     Status: Abnormal   Collection Time    11/24/12 12:04 AM      Result Value Range   Sodium 141  135 - 145 mEq/L   Potassium 4.0  3.5 - 5.1 mEq/L   Chloride 105  96 - 112 mEq/L   CO2 24  19 - 32 mEq/L   Glucose, Bld 163 (*) 70 - 99 mg/dL   BUN 37 (*) 6 - 23 mg/dL   Creatinine, Ser 5.62 (*) 0.50 - 1.35 mg/dL   Calcium 9.3  8.4 - 13.0 mg/dL   Total Protein 6.8  6.0 - 8.3 g/dL   Albumin 3.1 (*) 3.5 - 5.2 g/dL   AST 18  0 - 37 U/L   ALT 10  0 - 53 U/L   Alkaline Phosphatase 48  39 - 117 U/L   Total Bilirubin 0.1 (*) 0.3 - 1.2 mg/dL   GFR calc non Af Amer 33 (*) >90 mL/min   GFR calc Af Amer 38 (*) >90 mL/min   Comment:            The eGFR has been calculated  using the CKD EPI equation.     This calculation has not been     validated in all clinical     situations.     eGFR's persistently     <90 mL/min signify     possible Chronic Kidney Disease.  LIPASE, BLOOD     Status: None   Collection Time    11/24/12 12:04 AM      Result Value Range   Lipase 35  11 - 59 U/L  TROPONIN I     Status: None   Collection Time    11/24/12 12:04 AM      Result Value Range   Troponin I <0.30  <0.30 ng/mL   Comment:            Due to the release kinetics of cTnI,     a negative result within the first hours     of the onset of symptoms does not rule out     myocardial infarction with certainty.     If myocardial infarction is still suspected,     repeat the test at appropriate intervals.  CG4 I-STAT (LACTIC ACID)     Status: None   Collection Time    11/24/12 12:15 AM      Result Value Range   Lactic Acid, Venous 0.88  0.5 - 2.2 mmol/L  POCT I-STAT, CHEM 8     Status: Abnormal   Collection Time    11/24/12 12:15 AM       Result Value Range   Sodium 143  135 - 145 mEq/L   Potassium 4.0  3.5 - 5.1 mEq/L   Chloride 109  96 - 112 mEq/L   BUN 36 (*) 6 - 23 mg/dL   Creatinine, Ser 0.98 (*) 0.50 - 1.35 mg/dL   Glucose, Bld 119 (*) 70 - 99 mg/dL   Calcium, Ion 1.47  8.29 - 1.30 mmol/L   TCO2 27  0 - 100 mmol/L   Hemoglobin 11.2 (*) 13.0 - 17.0 g/dL   HCT 56.2 (*) 13.0 - 86.5 %  OCCULT BLOOD, POC DEVICE     Status: Abnormal   Collection Time    11/24/12 12:20 AM      Result Value Range   Fecal Occult Bld POSITIVE (*) NEGATIVE  TYPE AND SCREEN     Status: None   Collection Time    11/24/12 12:55 AM      Result Value Range   ABO/RH(D) B POS     Antibody Screen NEG     Sample Expiration 11/27/2012    HEMOGLOBIN AND HEMATOCRIT, BLOOD     Status: Abnormal   Collection Time    11/24/12  3:39 AM      Result Value Range   Hemoglobin 10.0 (*) 13.0 - 17.0 g/dL   HCT 78.4 (*) 69.6 - 29.5 %  BASIC METABOLIC PANEL     Status: Abnormal   Collection Time    11/24/12  3:52 AM      Result Value Range   Sodium 144  135 - 145 mEq/L   Potassium 4.4  3.5 - 5.1 mEq/L   Chloride 110  96 - 112 mEq/L   CO2 25  19 - 32 mEq/L   Glucose, Bld 139 (*) 70 - 99 mg/dL   BUN 38 (*) 6 - 23 mg/dL   Creatinine, Ser 2.84 (*) 0.50 - 1.35 mg/dL   Calcium 8.9  8.4 - 13.2 mg/dL   GFR calc non Af Amer 31 (*) >  90 mL/min   GFR calc Af Amer 35 (*) >90 mL/min   Comment:            The eGFR has been calculated     using the CKD EPI equation.     This calculation has not been     validated in all clinical     situations.     eGFR's persistently     <90 mL/min signify     possible Chronic Kidney Disease.  HEMOGLOBIN AND HEMATOCRIT, BLOOD     Status: Abnormal   Collection Time    11/24/12  9:35 AM      Result Value Range   Hemoglobin 10.8 (*) 13.0 - 17.0 g/dL   HCT 16.1 (*) 09.6 - 04.5 %    Ct Abdomen Pelvis Wo Contrast  11/24/2012  *RADIOLOGY REPORT*  Clinical Data: Abdominal pain  CT ABDOMEN AND PELVIS WITHOUT CONTRAST   Technique:  Multidetector CT imaging of the abdomen and pelvis was performed following the standard protocol without intravenous contrast.  Comparison: 11/22/2012 radiographs, 11/10/2012 CT  Findings: Hiatal hernia.  NG tube terminates at the level of the hiatus.  Mild bibasilar opacities.  Heart size upper normal to mildly enlarged. Pericardial effusion has decreased in size, with minimal residual.  There is a small amount of mediastinal air. Correlate clinically for interval pericardiocentesis.  Organ abnormality/lesion detection is limited in the absence of intravenous contrast. Within this limitation, unremarkable liver, spleen, pancreas, adrenal glands, biliary system.  Lobular renal contours with incompletely characterized lesions, similar to recent prior.  The hydroureteronephrosis has decreased/resolved on the left.  No CT evidence for colitis. Nonspecific rectal wall thickening, similar to prior.  Small bowel loops are relatively decompressed.  Normal appendix. No free intraperitoneal air or fluid.  No lymphadenopathy.  There is scattered atherosclerotic calcification of the aorta and its branches. No aneurysmal dilatation.  Thin-walled bladder.  Multilevel degenerative changes of the imaged spine. No acute or aggressive appearing osseous lesion.  Thoracolumbar curvature is unchanged.  There is a right femoral approach central venous catheter with tip near the right common iliac confluence.  IMPRESSION: Interval decrease in size of the pericardial effusion with development of a small amount of mediastinal air.  Correlate clinically for interval drainage.  Hiatal hernia.  NG tube tip just below the hiatus.  Bowel loops are relatively decompressed, without evidence for obstruction.  Interval decreased dilatation of the left renal collecting system, with resolution of the hydroureteronephrosis.  Bilateral incompletely characterized renal lesions.  Nonspecific rectal wall thickening, similar to prior.    Original Report Authenticated By: Jearld Lesch, M.D.    Dg Chest Portable 1 View  11/24/2012  *RADIOLOGY REPORT*  Clinical Data: Chest pain  PORTABLE CHEST - 1 VIEW  Comparison: 11/22/2012 radiograph, 11/11/2012 CT.  Findings: Hypoaeration.  Prominent cardiomediastinal contours, similar to prior.  Retrocardiac process and small effusions not excluded.  No pneumothorax.  No interval osseous change.  IMPRESSION: Prominent cardiomediastinal contours, similar to prior. A pericardial effusion was noted on prior CT.  Retrocardiac opacity not excluded.   Original Report Authenticated By: Jearld Lesch, M.D.    Dg Abd Acute W/chest  11/22/2012  *RADIOLOGY REPORT*  Clinical Data: Abdominal pain.  Possible GI bleed.  ACUTE ABDOMEN SERIES (ABDOMEN 2 VIEW & CHEST 1 VIEW)  Comparison: PA and lateral chest 11/10/2012.  Two views of the abdomen 11/17/2012. CT abdomen and pelvis 11/11/2012.  Findings: There is marked enlargement cardiopericardial silhouette. Note is made the patient  had a large pericardial effusion on the comparison CT scan.  Left basilar atelectasis is noted.  Right lung is clear.  Two views the abdomen show no free intraperitoneal air.  There is gaseous distention of the colon throughout.  Severe convex right scoliosis is noted.  IMPRESSION:  1.  Enlarged cardiopericardial silhouette is stable in appearance. Note is made the patient pericardial effusion and on the comparison CT. 2.  Gaseous distention of the colon compatible with ileus. Negative for free air.   Original Report Authenticated By: Holley Dexter, M.D.               Blood pressure 138/79, pulse 98, temperature 98.1 F (36.7 C), temperature source Oral, resp. rate 20, height 6' (1.829 m), weight 86.1 kg (189 lb 13.1 oz), SpO2 100.00%.  Physical exam:   General -- African-American male no distress. His speeches unintelligible. NG tube is not currently training any obvious blood. Eyes -- sclera nonicteric Lungs --  clear Heart -- regular rate and rhythm without murmurs are gallops Abdomen -- soft and nontender  Assessment: 1. Hematemesis. He just had recent EGD revealing only some gastritis hiatal hernia. 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 We probably should go ahead and repeat his EGD and hope for an early discharge.  Plan: will tentatively plan on EGD in the morning. This is if we are able to get consent from the patient's family. In the interim, to continue on PPI therapy.   EDWARDS JR,JAMES L 11/24/2012, 3:14 PM     Family phone numbers [phone]

## 2012-11-24 NOTE — H&P (Signed)
Chief Complaint:  n/v  HPI: 67 yo male cva with rt sided hemiparesis, dementia, just [facility] for [facility] and found to have a large pericardial effusion for which he had a pericardial window, had egd and suffered from postop ileus.  D/c yesterday.  Sent back today from [facility] for vomiting blood several episodes.  No fevers.  No diarrhea reported.  All history obtained from ED staff.  ngt has been placed which has less than 50cc of bloody material present.  Pt denies any abd pain.    Review of Systems:  Positive and negative as per HPI otherwise all other systems are negative  Past Medical History: Past Medical History  Diagnosis Date  . Dementia   . Stroke   . Hydronephrosis   . Hypertension   . Hyperlipemia   . GERD (gastroesophageal reflux disease)   . BPH (benign prostatic hyperplasia)    Past Surgical History  Procedure Laterality Date  . Esophagogastroduodenoscopy N/A 11/11/2012    Procedure: ESOPHAGOGASTRODUODENOSCOPY (EGD);  Surgeon: [name], MD;  Location: [facility] ENDOSCOPY;  Service: Endoscopy;  Laterality: N/A;  . Subxyphoid pericardial window N/A 11/13/2012    Procedure: SUBXYPHOID PERICARDIAL WINDOW;  Surgeon: Delight Ovens, MD;  Location: [facility] OR;  Service: Thoracic;  Laterality: N/A;    Medications: Prior to Admission medications   Medication Sig Start Date End Date Taking? Authorizing Provider  amLODipine (NORVASC) 10 MG tablet Take 10 mg by mouth daily.   Yes Historical Provider, MD  bisacodyl (BISACODYL) 5 MG EC tablet Take 15 mg by mouth at bedtime.   Yes Historical Provider, MD  citalopram (CELEXA) 10 MG tablet Take 5 mg by mouth daily.   Yes Historical Provider, MD  docusate sodium (COLACE) 100 MG capsule Take 100 mg by mouth 2 (two) times daily.   Yes Historical Provider, MD  hydrALAZINE (APRESOLINE) 50 MG tablet Take 50 mg by mouth 3 (three) times daily.   Yes Historical Provider, MD  lactulose (CHRONULAC) 10 GM/15ML solution Take 30  mLs by mouth 2 (two) times daily.   Yes Historical Provider, MD  lisinopril (PRINIVIL,ZESTRIL) 10 MG tablet Take 1 tablet (10 mg total) by mouth daily. 11/20/12  Yes Rhetta Mura, MD  memantine (NAMENDA) 10 MG tablet Take 10 mg by mouth 2 (two) times daily.   Yes Historical Provider, MD  metoprolol (LOPRESSOR) 50 MG tablet Take 50 mg by mouth every evening.   Yes Historical Provider, MD  metoprolol succinate (TOPROL-XL) 200 MG 24 hr tablet Take 1 tablet (200 mg total) by mouth daily. Take with or immediately following a meal. 11/20/12  Yes Rhetta Mura, MD  omeprazole (PRILOSEC) 20 MG capsule Take 20 mg by mouth daily.   Yes Historical Provider, MD  ondansetron (ZOFRAN) 4 MG tablet Take 4 mg by mouth once.   Yes Historical Provider, MD  simvastatin (ZOCOR) 40 MG tablet Take 40 mg by mouth every evening.   Yes Historical Provider, MD  tamsulosin (FLOMAX) 0.4 MG CAPS Take 0.4 mg by mouth daily.   Yes Historical Provider, MD    Allergies:  No Known Allergies  Social History:  reports that he has never smoked. He has never used smokeless tobacco. He reports that he does not drink alcohol or use illicit drugs.  Family History: History reviewed. No pertinent family history.  Physical Exam: Filed Vitals:   11/23/12 2312 11/23/12 2317 11/24/12 0032  BP:  146/80 155/79  Pulse:  130 120  Temp:   100.3 F (  37.9 C)  Resp:  26 26  SpO2: 96% 97% 100%   General appearance: alert, no distress and slowed mentation Head: Normocephalic, without obvious abnormality, atraumatic Eyes: negative Nose: Nares normal. Septum midline. Mucosa normal. No drainage or sinus tenderness. Neck: no JVD and supple, symmetrical, trachea midline Lungs: clear to auscultation bilaterally Heart: regular rate and rhythm, S1, S2 normal, no murmur, click, rub or gallop Abdomen: soft, non-tender; bowel sounds normal; no masses,  no organomegaly Extremities: extremities normal, atraumatic, no cyanosis or  edema Pulses: 2+ and symmetric Skin: Skin color, texture, turgor normal. No rashes or lesions Neurologic: Cranial nerves: normal  Rt upper ext contracted     Labs on Admission:   Recent Labs  11/22/12 1558 11/24/12 0004 11/24/12 0015  NA 142 141 143  K 4.8 4.0 4.0  CL 107 105 109  CO2 20 24  --   GLUCOSE 114* 163* 162*  BUN 26* 37* 36*  CREATININE 1.97* 2.00* 1.90*  CALCIUM 10.1 9.3  --     Recent Labs  11/22/12 1558 11/24/12 0004  AST 18 18  ALT 12 10  ALKPHOS 59 48  BILITOT 0.2* 0.1*  PROT 8.0 6.8  ALBUMIN 3.5 3.1*    Recent Labs  11/22/12 1558 11/24/12 0004  LIPASE 42 35    Recent Labs  11/22/12 1558 11/24/12 0004 11/24/12 0015  WBC 8.3 11.2*  --   NEUTROABS 6.5  --   --   HGB 12.5* 10.6* 11.2*  HCT 37.7* 32.2* 33.0*  MCV 80.2 78.7  --   PLT 352 384  --     Recent Labs  11/22/12 1652 11/24/12 0004  TROPONINI <0.30 <0.30   Radiological Exams on Admission:  Dg Chest Portable 1 View  11/24/2012  *RADIOLOGY REPORT*  Clinical Data: Chest pain  PORTABLE CHEST - 1 VIEW  Comparison: 11/22/2012 radiograph, 11/11/2012 CT.  Findings: Hypoaeration.  Prominent cardiomediastinal contours, similar to prior.  Retrocardiac process and small effusions not excluded.  No pneumothorax.  No interval osseous change.  IMPRESSION: Prominent cardiomediastinal contours, similar to prior. A pericardial effusion was noted on prior CT.  Retrocardiac opacity not excluded.   Original Report Authenticated By: Jearld Lesch, M.D.    Dg Abd Acute W/chest  11/22/2012  *RADIOLOGY REPORT*  Clinical Data: Abdominal pain.  Possible GI bleed.  ACUTE ABDOMEN SERIES (ABDOMEN 2 VIEW & CHEST 1 VIEW)  Comparison: PA and lateral chest 11/10/2012.  Two views of the abdomen 11/17/2012. CT abdomen and pelvis 11/11/2012.  Findings: There is marked enlargement cardiopericardial silhouette. Note is made the patient had a large pericardial effusion on the comparison CT scan.  Left basilar  atelectasis is noted.  Right lung is clear.  Two views the abdomen show no free intraperitoneal air.  There is gaseous distention of the colon throughout.  Severe convex right scoliosis is noted.  IMPRESSION:  1.  Enlarged cardiopericardial silhouette is stable in appearance. Note is made the patient pericardial effusion and on the comparison CT. 2.  Gaseous distention of the colon compatible with ileus. Negative for free air.   Original Report Authenticated By: Holley Dexter, M.D.     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  Place on protonix gtt.  Ivf.  Hold all asa products.  Serial h/h q 6hour.  Rectal exam per EDP brown normal caliber stool but heme positive.  Presumptive full code.  Code status does not appear to have been addressed during hospitalization last week and per discharge summary from yesterday also not addressed.  Needs to be clarified.  Admit to tele bed.  Jairus Tonne A 11/24/2012, 1:19 AM

## 2012-11-24 NOTE — Progress Notes (Signed)
Attempted to contact family members via numbers listed by Dr. Mahala Menghini and Dr. Randa Evens. Unable to get contact anyone due to numbers either being out of service or a fast busy signal.  Verified these numbers with [facility] myself and these are the same numbers. Attempted these numbers twice as did Dr. Randa Evens who was also unable to get through.

## 2012-11-25 ENCOUNTER — Encounter (HOSPITAL_COMMUNITY): Payer: Self-pay | Admitting: *Deleted

## 2012-11-25 ENCOUNTER — Encounter (HOSPITAL_COMMUNITY)
Admission: EM | Disposition: A | Discharge status: Skilled Nursing Facility | Payer: Self-pay | Source: Home / Self Care | Attending: Internal Medicine

## 2012-11-25 DIAGNOSIS — K920 Hematemesis: Principal | ICD-10-CM

## 2012-11-25 DIAGNOSIS — F0390 Unspecified dementia without behavioral disturbance: Secondary | ICD-10-CM

## 2012-11-25 DIAGNOSIS — I10 Essential (primary) hypertension: Secondary | ICD-10-CM

## 2012-11-25 HISTORY — PX: ESOPHAGOGASTRODUODENOSCOPY: SHX5428

## 2012-11-25 LAB — BASIC METABOLIC PANEL
BUN: 23 mg/dL (ref 6–23)
GFR calc Af Amer: 49 mL/min — ABNORMAL LOW (ref >90)
GFR calc non Af Amer: 42 mL/min — ABNORMAL LOW (ref >90)
Potassium: 4.5 mEq/L (ref 3.5–5.1)
Sodium: 146 mEq/L — ABNORMAL HIGH (ref 135–145)

## 2012-11-25 LAB — HEMOGLOBIN AND HEMATOCRIT, BLOOD
HCT: 29.1 % — ABNORMAL LOW (ref 39.0–52.0)
Hemoglobin: 9.9 g/dL — ABNORMAL LOW (ref 13.0–17.0)

## 2012-11-25 SURGERY — EGD (ESOPHAGOGASTRODUODENOSCOPY)
Anesthesia: Moderate Sedation

## 2012-11-25 MED ORDER — CITALOPRAM HYDROBROMIDE 10 MG PO TABS
5.0000 mg | ORAL_TABLET | Freq: Every day | ORAL | Status: DC
  Administered 2012-11-25 – 2012-11-26 (×2): 5 mg via ORAL
  Administered 2012-11-27: 10:00:00 via ORAL
  Filled 2012-11-25 (×3): qty 1

## 2012-11-25 MED ORDER — MIDAZOLAM HCL 10 MG/2ML IJ SOLN
INTRAMUSCULAR | Status: DC | PRN
  Administered 2012-11-25: 1 mg via INTRAVENOUS
  Administered 2012-11-25 (×2): 2 mg via INTRAVENOUS

## 2012-11-25 MED ORDER — SIMVASTATIN 40 MG PO TABS
40.0000 mg | ORAL_TABLET | Freq: Every evening | ORAL | Status: DC
  Administered 2012-11-25: 40 mg via ORAL
  Filled 2012-11-25 (×2): qty 1

## 2012-11-25 MED ORDER — METOPROLOL SUCCINATE ER 100 MG PO TB24
200.0000 mg | ORAL_TABLET | Freq: Every day | ORAL | Status: DC
  Administered 2012-11-25 – 2012-11-27 (×3): 200 mg via ORAL
  Filled 2012-11-25 (×3): qty 2

## 2012-11-25 MED ORDER — SUCRALFATE 1 GM/10ML PO SUSP
1.0000 g | Freq: Three times a day (TID) | ORAL | Status: DC
  Administered 2012-11-25 – 2012-11-27 (×9): 1 g via ORAL
  Filled 2012-11-25 (×12): qty 10

## 2012-11-25 MED ORDER — DOCUSATE SODIUM 100 MG PO CAPS
100.0000 mg | ORAL_CAPSULE | Freq: Two times a day (BID) | ORAL | Status: DC
  Administered 2012-11-26 – 2012-11-27 (×3): 100 mg via ORAL
  Filled 2012-11-25 (×6): qty 1

## 2012-11-25 MED ORDER — SODIUM CHLORIDE 0.45 % IV SOLN
INTRAVENOUS | Status: AC
  Administered 2012-11-25: 13:00:00 via INTRAVENOUS

## 2012-11-25 MED ORDER — PANTOPRAZOLE SODIUM 40 MG PO TBEC
40.0000 mg | DELAYED_RELEASE_TABLET | Freq: Two times a day (BID) | ORAL | Status: DC
  Administered 2012-11-25 – 2012-11-27 (×5): 40 mg via ORAL
  Filled 2012-11-25 (×4): qty 1

## 2012-11-25 MED ORDER — AMLODIPINE BESYLATE 10 MG PO TABS
10.0000 mg | ORAL_TABLET | Freq: Every day | ORAL | Status: DC
  Administered 2012-11-25 – 2012-11-27 (×3): 10 mg via ORAL
  Filled 2012-11-25 (×3): qty 1

## 2012-11-25 MED ORDER — LISINOPRIL 10 MG PO TABS
10.0000 mg | ORAL_TABLET | Freq: Every day | ORAL | Status: DC
  Administered 2012-11-25 – 2012-11-27 (×3): 10 mg via ORAL
  Filled 2012-11-25 (×3): qty 1

## 2012-11-25 MED ORDER — METOPROLOL TARTRATE 50 MG PO TABS
50.0000 mg | ORAL_TABLET | Freq: Every evening | ORAL | Status: DC
  Administered 2012-11-25 – 2012-11-26 (×2): 50 mg via ORAL
  Filled 2012-11-25 (×3): qty 1

## 2012-11-25 MED ORDER — MEMANTINE HCL 10 MG PO TABS
10.0000 mg | ORAL_TABLET | Freq: Two times a day (BID) | ORAL | Status: DC
  Administered 2012-11-25 – 2012-11-27 (×5): 10 mg via ORAL
  Filled 2012-11-25 (×6): qty 1

## 2012-11-25 MED ORDER — BUTAMBEN-TETRACAINE-BENZOCAINE 2-2-14 % EX AERO
INHALATION_SPRAY | CUTANEOUS | Status: DC | PRN
  Administered 2012-11-25: 2 via TOPICAL

## 2012-11-25 MED ORDER — FENTANYL CITRATE 0.05 MG/ML IJ SOLN
INTRAMUSCULAR | Status: DC | PRN
  Administered 2012-11-25 (×2): 25 ug via INTRAVENOUS

## 2012-11-25 MED ORDER — HYDRALAZINE HCL 50 MG PO TABS
50.0000 mg | ORAL_TABLET | Freq: Three times a day (TID) | ORAL | Status: DC
  Administered 2012-11-25 – 2012-11-27 (×7): 50 mg via ORAL
  Filled 2012-11-25 (×9): qty 1

## 2012-11-25 MED ORDER — TAMSULOSIN HCL 0.4 MG PO CAPS
0.4000 mg | ORAL_CAPSULE | Freq: Every day | ORAL | Status: DC
  Administered 2012-11-25 – 2012-11-27 (×3): 0.4 mg via ORAL
  Filled 2012-11-25 (×3): qty 1

## 2012-11-25 NOTE — Progress Notes (Addendum)
[facility] PROGRESS NOTE  [name] ZOX:[id number] DOB: 07-05-1946 DOA: 11/23/2012  Brief HPI: 67 yo male cva with rt sided hemiparesis, dementia, just discharged for vomiting blood and found to have a large pericardial effusion for which he had a pericardial window, had egd and suffered from postop ileus. He was discharged to SNF. Sent back from snf for several episodes of bloody emesis. No fevers. No diarrhea reported. All history obtained from ED staff. NG tube was placed which had less than 50cc of bloody material present. Pt denied any abd pain.   Past medical history:  Past Medical History  Diagnosis Date  . Dementia   . Stroke   . Hydronephrosis   . Hypertension   . Hyperlipemia   . GERD (gastroesophageal reflux disease)   . BPH (benign prostatic hyperplasia)     Consultants: [facility]  Procedures:  EGD 3/12 revealed ulcerative esophagitis. Biopsies were taken.  Antibiotics: None  Subjective: Patient says he has hiccoughs. Denies any pain. Difficult to communicate due to dementia and history of stroke.  Objective: Vital Signs  Filed Vitals:   11/25/12 0855 11/25/12 0900 11/25/12 0910 11/25/12 0920  BP: 122/75 122/75 120/77 134/78  Pulse:      Temp:      TempSrc:      Resp: 17  19 21   Height:      Weight:      SpO2: 97% 93% 94% 95%    Intake/Output Summary (Last 24 hours) at 11/25/12 1117 Last data filed at 11/25/12 0900  Gross per 24 hour  Intake    570 ml  Output    200 ml  Net    370 ml   Filed Weights   11/24/12 1323 11/25/12 0500  Weight: 86.1 kg (189 lb 13.1 oz) 85.594 kg (188 lb 11.2 oz)    General appearance: alert, cooperative, appears older than stated age and no distress Head: Normocephalic, without obvious abnormality, atraumatic Resp: clear to auscultation bilaterally Cardio: regular rate and rhythm, S1, S2 normal, no murmur, click, rub or gallop GI: soft, non-tender; bowel sounds normal; no masses,  no  organomegaly Extremities: extremities normal, atraumatic, no cyanosis or edema Neurologic: Old right hemiparesis  Lab Results:  Basic Metabolic Panel:  Recent Labs Lab 11/19/12 0830 11/20/12 0705 11/22/12 1558 11/24/12 0004 11/24/12 0015 11/24/12 0352  NA  --  142 142 141 143 144  K  --  3.9 4.8 4.0 4.0 4.4  CL  --  109 107 105 109 110  CO2  --  22 20 24   --  25  GLUCOSE  --  105* 114* 163* 162* 139*  BUN  --  11 26* 37* 36* 38*  CREATININE  --  1.38* 1.97* 2.00* 1.90* 2.12*  CALCIUM  --  8.7 10.1 9.3  --  8.9  MG 1.6  --   --   --   --   --   PHOS 2.7  --   --   --   --   --    Liver Function Tests:  Recent Labs Lab 11/22/12 1558 11/24/12 0004  AST 18 18  ALT 12 10  ALKPHOS 59 48  BILITOT 0.2* 0.1*  PROT 8.0 6.8  ALBUMIN 3.5 3.1*    Recent Labs Lab 11/22/12 1558 11/24/12 0004  LIPASE 42 35   CBC:  Recent Labs Lab 11/20/12 0705 11/22/12 1558 11/24/12 0004 11/24/12 0015 11/24/12 0339 11/24/12 0935 11/24/12 2202  WBC 4.8 8.3 11.2*  --   --   --   --  NEUTROABS  --  6.5  --   --   --   --   --   HGB 11.2* 12.5* 10.6* 11.2* 10.0* 10.8* 9.6*  HCT 34.3* 37.7* 32.2* 33.0* 30.7* 33.0* 30.7*  MCV 78.5 80.2 78.7  --   --   --   --   PLT 275 352 384  --   --   --   --    Cardiac Enzymes:  Recent Labs Lab 11/22/12 1652 11/24/12 0004  TROPONINI <0.30 <0.30   BNP (last 3 results)  Recent Labs  11/12/12 2000  PROBNP 818.8*   CBG: No results found for this basename: GLUCAP,  in the last 168 hours  Recent Results (from the past 240 hour(s))  CLOSTRIDIUM DIFFICILE BY PCR     Status: None   Collection Time    11/17/12  1:20 PM      Result Value Range Status   C difficile by pcr NEGATIVE  NEGATIVE Final      Studies/Results: Ct Abdomen Pelvis Wo Contrast  11/24/2012  *RADIOLOGY REPORT*  Clinical Data: Abdominal pain  CT ABDOMEN AND PELVIS WITHOUT CONTRAST  Technique:  Multidetector CT imaging of the abdomen and pelvis was performed following  the standard protocol without intravenous contrast.  Comparison: 11/22/2012 radiographs, 11/10/2012 CT  Findings: Hiatal hernia.  NG tube terminates at the level of the hiatus.  Mild bibasilar opacities.  Heart size upper normal to mildly enlarged. Pericardial effusion has decreased in size, with minimal residual.  There is a small amount of mediastinal air. Correlate clinically for interval pericardiocentesis.  Organ abnormality/lesion detection is limited in the absence of intravenous contrast. Within this limitation, unremarkable liver, spleen, pancreas, adrenal glands, biliary system.  Lobular renal contours with incompletely characterized lesions, similar to recent prior.  The hydroureteronephrosis has decreased/resolved on the left.  No CT evidence for colitis. Nonspecific rectal wall thickening, similar to prior.  Small bowel loops are relatively decompressed.  Normal appendix. No free intraperitoneal air or fluid.  No lymphadenopathy.  There is scattered atherosclerotic calcification of the aorta and its branches. No aneurysmal dilatation.  Thin-walled bladder.  Multilevel degenerative changes of the imaged spine. No acute or aggressive appearing osseous lesion.  Thoracolumbar curvature is unchanged.  There is a right femoral approach central venous catheter with tip near the right common iliac confluence.  IMPRESSION: Interval decrease in size of the pericardial effusion with development of a small amount of mediastinal air.  Correlate clinically for interval drainage.  Hiatal hernia.  NG tube tip just below the hiatus.  Bowel loops are relatively decompressed, without evidence for obstruction.  Interval decreased dilatation of the left renal collecting system, with resolution of the hydroureteronephrosis.  Bilateral incompletely characterized renal lesions.  Nonspecific rectal wall thickening, similar to prior.   Original Report Authenticated By: Jearld Lesch, M.D.    Dg Chest Portable 1  View  11/24/2012  *RADIOLOGY REPORT*  Clinical Data: Chest pain  PORTABLE CHEST - 1 VIEW  Comparison: 11/22/2012 radiograph, 11/11/2012 CT.  Findings: Hypoaeration.  Prominent cardiomediastinal contours, similar to prior.  Retrocardiac process and small effusions not excluded.  No pneumothorax.  No interval osseous change.  IMPRESSION: Prominent cardiomediastinal contours, similar to prior. A pericardial effusion was noted on prior CT.  Retrocardiac opacity not excluded.   Original Report Authenticated By: Jearld Lesch, M.D.     Medications:  Scheduled: . amLODipine  10 mg Oral Daily  . citalopram  5 mg Oral Daily  .  docusate sodium  100 mg Oral BID  . hydrALAZINE  50 mg Oral TID  . lisinopril  10 mg Oral Daily  . memantine  10 mg Oral BID  . metoprolol  50 mg Oral QPM  . metoprolol succinate  200 mg Oral Daily  . pantoprazole  40 mg Oral BID WC  . simvastatin  40 mg Oral QPM  . sodium chloride  3 mL Intravenous Q12H  . sucralfate  1 g Oral TID WC & HS  . tamsulosin  0.4 mg Oral Daily   Continuous:  [id number] (ZOFRAN) IV, ondansetron  Assessment/Plan:  Principal Problem:   Hematemesis/vomiting blood Active Problems:   Dementia   Stroke   Hypertension   Hemiparesis affecting right side as late effect of stroke   Expressive aphasia   Pericardial effusion   Acute-on-chronic renal failure   Hematemesis Recurrent episode. Repeat EGD done today which revealed ulcerative esophagitis. No other lesions noted. Will change to BID PPI. Carafate. GI to follow up on biopsies. Diet advanced. EGD done on 2/26 showed hiatal hernia, minimal antral gastritis, no bleeding source. Has been off of aspirin/plavix since last hospitalization. Will hold till cleared to take by GI.   Anemia Likely multifactorial with acute loss and secondary to chronic disease. Hgb stable. Monitor.  HTN with sinus tachycardia Has been a chronic issue. Resume home medications. Monitor vitals  closely.  Pericardial effusion s/p pericardial window on 2/28  Stable.   Dementia:  Continue namenda 10 mg  Hemiparesis affecting right side as late effect of stroke, Expressive aphasia:  Chronic findings. Stable.  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 Will repeat labs in AM. Gentle hydration. His baseline seems to be 1.5-1.3.  Code Status Not known. Full Code for now.  DVT Prophylaxis SCD's  Family Communication: None at bedisde  Disposition Plan: Likely back to [facility] 3/13    LOS: 2 days   [facility]  [facility] Pager [phone] 11/25/2012, 11:17 AM  If 8PM-8AM, please contact night-coverage at [email or web address], password [facility]

## 2012-11-25 NOTE — Progress Notes (Signed)
Referral received today for SNF placement. FL2 and Pasarr are completed. Full assessment to follow.   Sherald Barge, LCSW-A Clinical Social Worker [phone]

## 2012-11-25 NOTE — Op Note (Signed)
[facility] [address] [place], [address]   ENDOSCOPY PROCEDURE REPORT  PATIENT: [name], [name].  MR#: [id number] BIRTHDATE: 17-Jul-1946 , 66  yrs. old GENDER: Male ENDOSCOPIST:James Randa Evens, MD REFERRED BY:  [facility] PROCEDURE DATE:  11/25/2012 PROCEDURE:      EGD with biopsy ASA CLASS:     class III INDICATIONS:   hematemesis MEDICATION:    fentanyl 50 mcg, versed 5 mg IV TOPICAL ANESTHETIC:    cetacaine spray  DESCRIPTION OF PROCEDURE:   the procedure had been explained to the patient and consent obtained. The patient has been unable to speak since his stroke did indicate understanding of the procedure has had this done before. The Pentax upper scope was inserted into the esophagus with deglutination. The patient had quite a bit of gagging throughout the procedure and his posterior pharynx was suctioned throughout. The majority of the esophagus was normal. Right above the GE junction it was hard and inflamed with ulcerations. This was biopsied. The patient had a large hiatal hernia. There was no bleeding in the hiatal hernia sac and no Cameron ulcers were seen to stomach was entered and examined in the foreign retroflex view with no abnormalities. There was no active bleeding or signs of recently. The duodenum including the bulb and 2nd portion was essentially normal. The scope was withdrawn back into the stomach and the initial findings were confirmed on withdrawal. The patient was aggressively suctioned following the procedure in his 02 sats were quite good at determination the procedure.     COMPLICATIONS: None  ENDOSCOPIC IMPRESSION: 1. Ulcerative esophagitis. This is located right above the GE junction. This area was quite hard upon biopsy and could indicate chronic inflammation.  RECOMMENDATIONS: we will recommend going ahead in feeding the patient and aggressively treating him for reflux esophagitis pending  the biopsies.    _______________________________ Rosalie DoctorCarman Ching, MD 11/25/2012 8:46 AM

## 2012-11-25 NOTE — Progress Notes (Signed)
UR Completed Brenda Graves-Bigelow, RN,BSN [phone]  

## 2012-11-25 NOTE — Care Management Note (Signed)
    Page 1 of 1   11/25/2012     12:42:48 PM   CARE MANAGEMENT NOTE 11/25/2012  Patient:  THORIN, STARNER   Account Number:  [id number]  Date Initiated:  11/25/2012  Documentation initiated by:  GRAVES-BIGELOW,BRENDA  Subjective/Objective Assessment:   Pt recently just d/c to a  [facility]. Pt returns with NV/V. Post EGD.     Action/Plan:   CM will continue to monitor for disposition needs if any. CSW will assist for pt return to SNF.   Anticipated DC Date:  11/27/2012   Anticipated DC Plan:  SKILLED NURSING FACILITY  In-house referral  Clinical Social Worker      DC Planning Services  CM consult      Choice offered to / List presented to:             Status of service:  Completed, signed off Medicare Important Message given?   (If response is "NO", the following Medicare IM given date fields will be blank) Date Medicare IM given:   Date Additional Medicare IM given:    Discharge Disposition:  SKILLED NURSING FACILITY  Per UR Regulation:  Reviewed for med. necessity/level of care/duration of stay  If discussed at Long Length of Stay Meetings, dates discussed:    Comments:

## 2012-11-25 NOTE — H&P (View-Only) (Signed)
[facility] Reason for consult: 67-year-old African-American male who is in the hospital last month. He had a complicated course that included. pericarditis. He had a pericardial window. He has a history of GERD and had hematemesis. This resulted in EGD by Dr. Schooler which hiatal hernia with no other significant findings. It was felt the patient may need a repeat EGD for unclear reasons. He is readmitted with hematemesis. His other problems are history of stroke, dementia expressive aphasia hypertension. Patient currently denies any abdominal pain. He was on omeprazole and apparently was taking that on the submission. Hemoglobin is 10.8 Referring Physician: [facility]     Past Medical History  Diagnosis Date  . Dementia   . Stroke   . Hydronephrosis   . Hypertension   . Hyperlipemia   . GERD (gastroesophageal reflux disease)   . BPH (benign prostatic hyperplasia)     Past Surgical History  Procedure Laterality Date  . Esophagogastroduodenoscopy N/A 11/11/2012    Procedure: ESOPHAGOGASTRODUODENOSCOPY (EGD);  Surgeon: Vincent C. Schooler, MD;  Location: [facility] ENDOSCOPY;  Service: Endoscopy;  Laterality: N/A;  . Subxyphoid pericardial window N/A 11/13/2012    Procedure: SUBXYPHOID PERICARDIAL WINDOW;  Surgeon: Edward B Gerhardt, MD;  Location: [facility] OR;  Service: Thoracic;  Laterality: N/A;    History reviewed. No pertinent family history.  Social History:  reports that he has never smoked. He has never used smokeless tobacco. He reports that he does not drink alcohol or use illicit drugs.  Allergies: No Known Allergies  Medications; . sodium chloride  3 mL Intravenous Q12H   PRN Meds ondansetron (ZOFRAN) IV, ondansetron Results for orders placed during the hospital encounter of 11/23/12 (from the past 48 hour(s))  CBC     Status: Abnormal   Collection Time    11/24/12 12:04 AM      Result Value Range   WBC 11.2 (*) 4.0 - 10.5 K/uL   RBC 4.09 (*) 4.22 -  5.81 MIL/uL   Hemoglobin 10.6 (*) 13.0 - 17.0 g/dL   HCT 32.2 (*) 39.0 - 52.0 %   MCV 78.7  78.0 - 100.0 fL   MCH 25.9 (*) 26.0 - 34.0 pg   MCHC 32.9  30.0 - 36.0 g/dL   RDW 14.7  11.5 - 15.5 %   Platelets 384  150 - 400 K/uL  COMPREHENSIVE METABOLIC PANEL     Status: Abnormal   Collection Time    11/24/12 12:04 AM      Result Value Range   Sodium 141  135 - 145 mEq/L   Potassium 4.0  3.5 - 5.1 mEq/L   Chloride 105  96 - 112 mEq/L   CO2 24  19 - 32 mEq/L   Glucose, Bld 163 (*) 70 - 99 mg/dL   BUN 37 (*) 6 - 23 mg/dL   Creatinine, Ser 2.00 (*) 0.50 - 1.35 mg/dL   Calcium 9.3  8.4 - 10.5 mg/dL   Total Protein 6.8  6.0 - 8.3 g/dL   Albumin 3.1 (*) 3.5 - 5.2 g/dL   AST 18  0 - 37 U/L   ALT 10  0 - 53 U/L   Alkaline Phosphatase 48  39 - 117 U/L   Total Bilirubin 0.1 (*) 0.3 - 1.2 mg/dL   GFR calc non Af Amer 33 (*) >90 mL/min   GFR calc Af Amer 38 (*) >90 mL/min   Comment:            The eGFR has been calculated       using the CKD EPI equation.     This calculation has not been     validated in all clinical     situations.     eGFR's persistently     <90 mL/min signify     possible Chronic Kidney Disease.  LIPASE, BLOOD     Status: None   Collection Time    11/24/12 12:04 AM      Result Value Range   Lipase 35  11 - 59 U/L  TROPONIN I     Status: None   Collection Time    11/24/12 12:04 AM      Result Value Range   Troponin I <0.30  <0.30 ng/mL   Comment:            Due to the release kinetics of cTnI,     a negative result within the first hours     of the onset of symptoms does not rule out     myocardial infarction with certainty.     If myocardial infarction is still suspected,     repeat the test at appropriate intervals.  CG4 I-STAT (LACTIC ACID)     Status: None   Collection Time    11/24/12 12:15 AM      Result Value Range   Lactic Acid, Venous 0.88  0.5 - 2.2 mmol/L  POCT I-STAT, CHEM 8     Status: Abnormal   Collection Time    11/24/12 12:15 AM       Result Value Range   Sodium 143  135 - 145 mEq/L   Potassium 4.0  3.5 - 5.1 mEq/L   Chloride 109  96 - 112 mEq/L   BUN 36 (*) 6 - 23 mg/dL   Creatinine, Ser 1.90 (*) 0.50 - 1.35 mg/dL   Glucose, Bld 162 (*) 70 - 99 mg/dL   Calcium, Ion 1.23  1.13 - 1.30 mmol/L   TCO2 27  0 - 100 mmol/L   Hemoglobin 11.2 (*) 13.0 - 17.0 g/dL   HCT 33.0 (*) 39.0 - 52.0 %  OCCULT BLOOD, POC DEVICE     Status: Abnormal   Collection Time    11/24/12 12:20 AM      Result Value Range   Fecal Occult Bld POSITIVE (*) NEGATIVE  TYPE AND SCREEN     Status: None   Collection Time    11/24/12 12:55 AM      Result Value Range   ABO/RH(D) B POS     Antibody Screen NEG     Sample Expiration 11/27/2012    HEMOGLOBIN AND HEMATOCRIT, BLOOD     Status: Abnormal   Collection Time    11/24/12  3:39 AM      Result Value Range   Hemoglobin 10.0 (*) 13.0 - 17.0 g/dL   HCT 30.7 (*) 39.0 - 52.0 %  BASIC METABOLIC PANEL     Status: Abnormal   Collection Time    11/24/12  3:52 AM      Result Value Range   Sodium 144  135 - 145 mEq/L   Potassium 4.4  3.5 - 5.1 mEq/L   Chloride 110  96 - 112 mEq/L   CO2 25  19 - 32 mEq/L   Glucose, Bld 139 (*) 70 - 99 mg/dL   BUN 38 (*) 6 - 23 mg/dL   Creatinine, Ser 2.12 (*) 0.50 - 1.35 mg/dL   Calcium 8.9  8.4 - 10.5 mg/dL   GFR calc non Af Amer 31 (*) >  90 mL/min   GFR calc Af Amer 35 (*) >90 mL/min   Comment:            The eGFR has been calculated     using the CKD EPI equation.     This calculation has not been     validated in all clinical     situations.     eGFR's persistently     <90 mL/min signify     possible Chronic Kidney Disease.  HEMOGLOBIN AND HEMATOCRIT, BLOOD     Status: Abnormal   Collection Time    11/24/12  9:35 AM      Result Value Range   Hemoglobin 10.8 (*) 13.0 - 17.0 g/dL   HCT 33.0 (*) 39.0 - 52.0 %    Ct Abdomen Pelvis Wo Contrast  11/24/2012  *RADIOLOGY REPORT*  Clinical Data: Abdominal pain  CT ABDOMEN AND PELVIS WITHOUT CONTRAST   Technique:  Multidetector CT imaging of the abdomen and pelvis was performed following the standard protocol without intravenous contrast.  Comparison: 11/22/2012 radiographs, 11/10/2012 CT  Findings: Hiatal hernia.  NG tube terminates at the level of the hiatus.  Mild bibasilar opacities.  Heart size upper normal to mildly enlarged. Pericardial effusion has decreased in size, with minimal residual.  There is a small amount of mediastinal air. Correlate clinically for interval pericardiocentesis.  Organ abnormality/lesion detection is limited in the absence of intravenous contrast. Within this limitation, unremarkable liver, spleen, pancreas, adrenal glands, biliary system.  Lobular renal contours with incompletely characterized lesions, similar to recent prior.  The hydroureteronephrosis has decreased/resolved on the left.  No CT evidence for colitis. Nonspecific rectal wall thickening, similar to prior.  Small bowel loops are relatively decompressed.  Normal appendix. No free intraperitoneal air or fluid.  No lymphadenopathy.  There is scattered atherosclerotic calcification of the aorta and its branches. No aneurysmal dilatation.  Thin-walled bladder.  Multilevel degenerative changes of the imaged spine. No acute or aggressive appearing osseous lesion.  Thoracolumbar curvature is unchanged.  There is a right femoral approach central venous catheter with tip near the right common iliac confluence.  IMPRESSION: Interval decrease in size of the pericardial effusion with development of a small amount of mediastinal air.  Correlate clinically for interval drainage.  Hiatal hernia.  NG tube tip just below the hiatus.  Bowel loops are relatively decompressed, without evidence for obstruction.  Interval decreased dilatation of the left renal collecting system, with resolution of the hydroureteronephrosis.  Bilateral incompletely characterized renal lesions.  Nonspecific rectal wall thickening, similar to prior.    Original Report Authenticated By: Andrew  DelGaizo, M.D.    Dg Chest Portable 1 View  11/24/2012  *RADIOLOGY REPORT*  Clinical Data: Chest pain  PORTABLE CHEST - 1 VIEW  Comparison: 11/22/2012 radiograph, 11/11/2012 CT.  Findings: Hypoaeration.  Prominent cardiomediastinal contours, similar to prior.  Retrocardiac process and small effusions not excluded.  No pneumothorax.  No interval osseous change.  IMPRESSION: Prominent cardiomediastinal contours, similar to prior. A pericardial effusion was noted on prior CT.  Retrocardiac opacity not excluded.   Original Report Authenticated By: Andrew  DelGaizo, M.D.    Dg Abd Acute W/chest  11/22/2012  *RADIOLOGY REPORT*  Clinical Data: Abdominal pain.  Possible GI bleed.  ACUTE ABDOMEN SERIES (ABDOMEN 2 VIEW & CHEST 1 VIEW)  Comparison: PA and lateral chest 11/10/2012.  Two views of the abdomen 11/17/2012. CT abdomen and pelvis 11/11/2012.  Findings: There is marked enlargement cardiopericardial silhouette. Note is made the patient   had a large pericardial effusion on the comparison CT scan.  Left basilar atelectasis is noted.  Right lung is clear.  Two views the abdomen show no free intraperitoneal air.  There is gaseous distention of the colon throughout.  Severe convex right scoliosis is noted.  IMPRESSION:  1.  Enlarged cardiopericardial silhouette is stable in appearance. Note is made the patient pericardial effusion and on the comparison CT. 2.  Gaseous distention of the colon compatible with ileus. Negative for free air.   Original Report Authenticated By: Thomas D'Alessio, M.D.               Blood pressure 138/79, pulse 98, temperature 98.1 F (36.7 C), temperature source Oral, resp. rate 20, height 6' (1.829 m), weight 86.1 kg (189 lb 13.1 oz), SpO2 100.00%.  Physical exam:   General -- African-American male no distress. His speeches unintelligible. NG tube is not currently training any obvious blood. Eyes -- sclera nonicteric Lungs --  clear Heart -- regular rate and rhythm without murmurs are gallops Abdomen -- soft and nontender  Assessment: 1. Hematemesis. He just had recent EGD revealing only some gastritis hiatal hernia. This could be a Mallery Leister. We probably should go ahead and repeat his EGD and hope for an early discharge.  Plan: will tentatively plan on EGD in the morning. This is if we are able to get consent from the patient's family. In the interim, to continue on PPI therapy.   Orval Dortch JR,Caty Tessler L 11/24/2012, 3:14 PM     Family phone numbers [phone] 

## 2012-11-25 NOTE — Interval H&P Note (Signed)
History and Physical Interval Note:  11/25/2012 8:18 AM  [name]  has presented today for surgery, with the diagnosis of hematemesis  The various methods of treatment have been discussed with the patient and family. After consideration of risks, benefits and other options for treatment, the patient has consented to  Procedure(s): ESOPHAGOGASTRODUODENOSCOPY (EGD) (N/A) as a surgical intervention .  The patient's history has been reviewed, patient examined, no change in status, stable for surgery.  I have reviewed the patient's chart and labs.  Questions were answered to the patient's satisfaction.     EDWARDS JR,JAMES L

## 2012-11-26 ENCOUNTER — Encounter (HOSPITAL_COMMUNITY): Payer: Self-pay | Admitting: Gastroenterology

## 2012-11-26 DIAGNOSIS — R112 Nausea with vomiting, unspecified: Secondary | ICD-10-CM

## 2012-11-26 LAB — BASIC METABOLIC PANEL
GFR calc Af Amer: 51 mL/min — ABNORMAL LOW (ref >90)
GFR calc non Af Amer: 44 mL/min — ABNORMAL LOW (ref >90)
Glucose, Bld: 102 mg/dL — ABNORMAL HIGH (ref 70–99)
Potassium: 4.2 mEq/L (ref 3.5–5.1)
Sodium: 142 mEq/L (ref 135–145)

## 2012-11-26 LAB — CBC
Hemoglobin: 9.6 g/dL — ABNORMAL LOW (ref 13.0–17.0)
MCHC: 32.1 g/dL (ref 30.0–36.0)

## 2012-11-26 LAB — HEMOGLOBIN AND HEMATOCRIT, BLOOD: HCT: 30.7 % — ABNORMAL LOW (ref 39.0–52.0)

## 2012-11-26 LAB — HEMOGLOBIN A1C: Hgb A1c MFr Bld: 5.9 % — ABNORMAL HIGH (ref <5.7)

## 2012-11-26 LAB — MAGNESIUM: Magnesium: 1.8 mg/dL (ref 1.5–2.5)

## 2012-11-26 MED ORDER — METOCLOPRAMIDE HCL 5 MG PO TABS
5.0000 mg | ORAL_TABLET | Freq: Three times a day (TID) | ORAL | Status: DC
  Administered 2012-11-26 – 2012-11-27 (×4): 5 mg via ORAL
  Filled 2012-11-26 (×7): qty 1

## 2012-11-26 MED ORDER — ATORVASTATIN CALCIUM 20 MG PO TABS
20.0000 mg | ORAL_TABLET | Freq: Every day | ORAL | Status: DC
  Administered 2012-11-26: 20 mg via ORAL
  Filled 2012-11-26 (×2): qty 1

## 2012-11-26 MED ORDER — HYDRALAZINE HCL 20 MG/ML IJ SOLN: 10.0000 mg | Freq: Four times a day (QID) | INTRAMUSCULAR | Status: DC | PRN

## 2012-11-26 NOTE — Progress Notes (Signed)
Pt arrived from [facility] SNF, FL2 with pasarr was sent to [facility] today. CSW contacted [facility] admissions coordinator to make sure that the pt can return back to SNF when ready to d/c. Pt in agreement to return to SNF. CSW attempted to contact family member to speak with them about return placement.  Sherald Barge, LCSW-A Clinical Social Worker [phone]

## 2012-11-26 NOTE — Progress Notes (Signed)
Pt had 8 beats Vtach. Asymptomatic. BP 118/69, HR 81. Paging MD. Will continue to monitor. Duwaine Maxin, RN

## 2012-11-26 NOTE — Progress Notes (Signed)
Pt vomited shortly after receiving morning pills. Dr. Rito Ehrlich made aware. No complaints of nausea/pain at this time. Will continue to monitor. Duwaine Maxin, RN

## 2012-11-26 NOTE — Progress Notes (Signed)
[facility] PROGRESS NOTE  [name] JXB:[id number] DOB: 11/24/1945 DOA: 11/23/2012  Brief HPI: 67 yo male cva with rt sided hemiparesis, dementia, just discharged for vomiting blood and found to have a large pericardial effusion for which he had a pericardial window, had egd and suffered from postop ileus. He was discharged to SNF. Sent back from snf for several episodes of bloody emesis. No fevers. No diarrhea reported. All history obtained from ED staff. NG tube was placed which had less than 50cc of bloody material present. Pt denied any abd pain.   Past medical history:  Past Medical History  Diagnosis Date  . Dementia   . Stroke   . Hydronephrosis   . Hypertension   . Hyperlipemia   . GERD (gastroesophageal reflux disease)   . BPH (benign prostatic hyperplasia)     Consultants: [facility]  Procedures:  EGD 3/12 revealed ulcerative esophagitis. Biopsies were taken.  Antibiotics: None  Subjective: Per Rn patient vomited his medications this morning. He is unable to communicate well. Denies any pain but states he has vomited many times since yesterday. Denies any pain.  Objective: Vital Signs  Filed Vitals:   11/25/12 2100 11/25/12 2211 11/26/12 0511 11/26/12 0930  BP: 107/65 146/82 161/73 166/88  Pulse: 90  92 106  Temp: 98.3 F (36.8 C)  98.2 F (36.8 C)   TempSrc: Oral  Oral   Resp: 20  20   Height:      Weight:      SpO2: 98%  100%     Intake/Output Summary (Last 24 hours) at 11/26/12 1235 Last data filed at 11/26/12 1035  Gross per 24 hour  Intake      0 ml  Output    350 ml  Net   -350 ml   Filed Weights   11/24/12 1323 11/25/12 0500  Weight: 86.1 kg (189 lb 13.1 oz) 85.594 kg (188 lb 11.2 oz)    General appearance: alert, cooperative, appears older than stated age and no distress Head: Normocephalic, without obvious abnormality, atraumatic Resp: clear to auscultation bilaterally Cardio: regular rate and rhythm, S1, S2 normal, no  murmur, click, rub or gallop GI: soft, non-tender; bowel sounds normal; no masses,  no organomegaly Extremities: extremities normal, atraumatic, no cyanosis or edema Neurologic: Old right hemiparesis  Lab Results:  Basic Metabolic Panel:  Recent Labs Lab 11/22/12 1558 11/24/12 0004 11/24/12 0015 11/24/12 0352 11/25/12 1026 11/26/12 0450  NA 142 141 143 144 146* 142  K 4.8 4.0 4.0 4.4 4.5 4.2  CL 107 105 109 110 111 110  CO2 20 24  --  25 26 23   GLUCOSE 114* 163* 162* 139* 125* 102*  BUN 26* 37* 36* 38* 23 19  CREATININE 1.97* 2.00* 1.90* 2.12* 1.62* 1.56*  CALCIUM 10.1 9.3  --  8.9 9.0 8.8   Liver Function Tests:  Recent Labs Lab 11/22/12 1558 11/24/12 0004  AST 18 18  ALT 12 10  ALKPHOS 59 48  BILITOT 0.2* 0.1*  PROT 8.0 6.8  ALBUMIN 3.5 3.1*    Recent Labs Lab 11/22/12 1558 11/24/12 0004  LIPASE 42 35   CBC:  Recent Labs Lab 11/20/12 0705 11/22/12 1558 11/24/12 0004  11/24/12 2202 11/25/12 1026 11/25/12 2119 11/26/12 0450 11/26/12 1140  WBC 4.8 8.3 11.2*  --   --   --   --  7.0  --   NEUTROABS  --  6.5  --   --   --   --   --   --   --  HGB 11.2* 12.5* 10.6*  < > 9.6* 9.9* 9.1* 9.6* 9.6*  HCT 34.3* 37.7* 32.2*  < > 30.7* 31.7* 29.1* 29.9* 30.7*  MCV 78.5 80.2 78.7  --   --   --   --  81.3  --   PLT 275 352 384  --   --   --   --  364  --   < > = values in this interval not displayed. Cardiac Enzymes:  Recent Labs Lab 11/22/12 1652 11/24/12 0004  TROPONINI <0.30 <0.30   BNP (last 3 results)  Recent Labs  11/12/12 2000  PROBNP 818.8*   CBG: No results found for this basename: GLUCAP,  in the last 168 hours  Recent Results (from the past 240 hour(s))  CLOSTRIDIUM DIFFICILE BY PCR     Status: None   Collection Time    11/17/12  1:20 PM      Result Value Range Status   C difficile by pcr NEGATIVE  NEGATIVE Final      Studies/Results: No results found.  Medications:  Scheduled: . amLODipine  10 mg Oral Daily  .  atorvastatin  20 mg Oral q1800  . citalopram  5 mg Oral Daily  . docusate sodium  100 mg Oral BID  . hydrALAZINE  50 mg Oral TID  . lisinopril  10 mg Oral Daily  . memantine  10 mg Oral BID  . metoprolol  50 mg Oral QPM  . metoprolol succinate  200 mg Oral Daily  . pantoprazole  40 mg Oral BID WC  . sodium chloride  3 mL Intravenous Q12H  . sucralfate  1 g Oral TID WC & HS  . tamsulosin  0.4 mg Oral Daily   Continuous:  ZOX:[id number] (ZOFRAN) IV, ondansetron  Assessment/Plan:  Principal Problem:   Hematemesis/vomiting blood Active Problems:   Dementia   Stroke   Hypertension   Hemiparesis affecting right side as late effect of stroke   Expressive aphasia   Pericardial effusion   Acute-on-chronic renal failure   Hematemesis Recurrent episode. Repeat EGD done 3/12 which revealed ulcerative esophagitis. No other lesions noted. BID PPI. Carafate. GI to follow up on biopsies. EGD done on 2/26 showed hiatal hernia, minimal antral gastritis, no bleeding source. Has been off of aspirin/plavix since last hospitalization. Will hold till cleared to take by GI. Hg is stable.  Recurrent Nausea/Vomiting Unclear etiology. Review of his records suggests that this has been an ongoing issue for long time. He has had gastric emptying study which showed mildly delayed emptying. Will give trial of Reglan. Continue clear liquids for now.  Anemia Likely multifactorial with acute loss and secondary to chronic disease. Hgb stable. Monitor.  HTN with sinus tachycardia Has been a chronic issue. Resume home medications. Monitor vitals closely.  Pericardial effusion s/p pericardial window on 2/28  Stable.   Dementia:  Continue namenda 10 mg  Hemiparesis affecting right side as late effect of stroke, Expressive aphasia:  Chronic findings. Stable.  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 His baseline seems to be 1.5-1.3.  Code Status Full Code for now.  DVT  Prophylaxis SCD's  Family Communication: None at bedisde  Disposition Plan: Likely back to SNF 3/14 if his vomiting subsides.    LOS: 3 days   [facility]  [facility] Pager [phone] 11/26/2012, 12:35 PM  If 8PM-8AM, please contact night-coverage at [email or web address], password [facility]

## 2012-11-26 NOTE — Progress Notes (Signed)
[facility] PROGRESS NOTE Subjective Pt doing ok tolerating diet no futher bleeding. Path shows benign inflammation.  Objective: Vital signs in last 24 hours: Temp:  [98.2 F (36.8 C)-98.5 F (36.9 C)] 98.5 F (36.9 C) (03/13 1500) Pulse Rate:  [90-106] 93 (03/13 1500) Resp:  [20] 20 (03/13 1500) BP: (107-166)/(65-88) 132/76 mmHg (03/13 1500) SpO2:  [96 %-100 %] 96 % (03/13 1500) Last BM Date: 11/25/12  Intake/Output from previous day: 03/12 0701 - 03/13 0700 In: 570 [P.O.:570] Out: 50 [Urine:50] Intake/Output this shift: Total I/O In: -  Out: 300 [Urine:300]   Lab Results:  Recent Labs  11/24/12 0004  11/24/12 2202 11/25/12 1026 11/25/12 2119 11/26/12 0450 11/26/12 1140  WBC 11.2*  --   --   --   --  7.0  --   HGB 10.6*  < > 9.6* 9.9* 9.1* 9.6* 9.6*  HCT 32.2*  < > 30.7* 31.7* 29.1* 29.9* 30.7*  PLT 384  --   --   --   --  364  --   < > = values in this interval not displayed. BMET  Recent Labs  11/24/12 0004 11/24/12 0015 11/24/12 0352 11/25/12 1026 11/26/12 0450  NA 141 143 144 146* 142  K 4.0 4.0 4.4 4.5 4.2  CL 105 109 110 111 110  CO2 24  --  25 26 23   CREATININE 2.00* 1.90* 2.12* 1.62* 1.56*   LFT  Recent Labs  11/24/12 0004  PROT 6.8  AST 18  ALT 10  ALKPHOS 48  BILITOT 0.1*   PT/INR No results found for this basename: LABPROT, INR,  in the last 72 hours PANCREAS  Recent Labs  11/24/12 0004  LIPASE 35         Studies/Results: No results found.  Medications: I have reviewed the patient's current medications.  Assessment/Plan: 1. Reflux esophagitis. Would continue to treat with the protonix. Please call us back if needed.   Gianelle Mccaul JR,Kashae Carstens L 11/26/2012, 4:31 PM

## 2012-11-27 DIAGNOSIS — K922 Gastrointestinal hemorrhage, unspecified: Secondary | ICD-10-CM

## 2012-11-27 LAB — CBC
MCH: 25.5 pg — ABNORMAL LOW (ref 26.0–34.0)
MCHC: 32.3 g/dL (ref 30.0–36.0)
Platelets: 368 10*3/uL (ref 150–400)
RBC: 3.53 MIL/uL — ABNORMAL LOW (ref 4.22–5.81)
RDW: 14.4 % (ref 11.5–15.5)

## 2012-11-27 LAB — BASIC METABOLIC PANEL
Calcium: 8.4 mg/dL (ref 8.4–10.5)
GFR calc Af Amer: 52 mL/min — ABNORMAL LOW (ref >90)
GFR calc non Af Amer: 45 mL/min — ABNORMAL LOW (ref >90)
Sodium: 141 mEq/L (ref 135–145)

## 2012-11-27 MED ORDER — SUCRALFATE 1 GM/10ML PO SUSP: 1.0000 g | Freq: Three times a day (TID) | ORAL | Status: DC

## 2012-11-27 MED ORDER — METOCLOPRAMIDE HCL 5 MG PO TABS: 5.0000 mg | ORAL_TABLET | Freq: Three times a day (TID) | ORAL | Status: DC

## 2012-11-27 MED ORDER — OMEPRAZOLE 20 MG PO CPDR: 20.0000 mg | DELAYED_RELEASE_CAPSULE | Freq: Two times a day (BID) | ORAL | Status: DC

## 2012-11-27 NOTE — Progress Notes (Signed)
Pt d/c'd to snf via ambulance in stable condition

## 2012-11-27 NOTE — Progress Notes (Signed)
Pt tolerated lunch well. No complaints, ate 100% of his lunch. Dr. Shelly Bombard notified, plan to d/c to snf

## 2012-11-27 NOTE — Discharge Summary (Signed)
[facility]  Physician Discharge Summary   Patient ID: [name] MRN: [id number] DOB/AGE: 1946-02-25 67 y.o.  Admit date: 11/23/2012 Discharge date: 11/27/2012  PCP: Physician at SNF  DISCHARGE DIAGNOSES:  Active Problems:   Dementia   Stroke   Hypertension   Hemiparesis affecting right side as late effect of stroke   Expressive aphasia   Pericardial effusion   Acute-on-chronic renal failure   RECOMMENDATIONS FOR OUTPATIENT FOLLOW UP: 1. Check CBC in 1 week. 2. Continue to hold his Aspirin and Plavix for now.  3. He will need to see Dr. Randa Evens (GI) in 2 weeks to determine when to resume Aspirin/plavix  DISCHARGE CONDITION: fair  Diet recommendation: Dys 3 diet  Filed Weights   11/24/12 1323 11/25/12 0500  Weight: 86.1 kg (189 lb 13.1 oz) 85.594 kg (188 lb 11.2 oz)    INITIAL HISTORY: 67 yo male cva with rt sided hemiparesis, dementia, just discharged for vomiting blood and found to have a large pericardial effusion for which he had a pericardial window, had egd and suffered from postop ileus. He was discharged to SNF. Sent back from snf for several episodes of bloody emesis. No fevers. No diarrhea reported. All history obtained from ED staff. NG tube was placed which had less than 50cc of bloody material present. Pt denied any abd pain.   Consultations:  [facility]  Procedures:  EGD 3/12 revealed ulcerative esophagitis. Biopsies were taken. Pathology revealed benign inflammation.  HOSPITAL COURSE:   Hematemesis  This was a recurrent episode. Repeat EGD done 3/12 which revealed ulcerative esophagitis. No other lesions noted. No further episodes of bleeding. He was started on BID PPI and Carafate. EGD done on 2/26 showed hiatal hernia, minimal antral gastritis, no bleeding source. Has been off of aspirin/plavix since last hospitalization. Will hold till cleared to take by GI. Hg is stable. Hgb has been stable.  Recurrent Nausea/Vomiting  Unclear  etiology. Review of his records suggests that this has been an ongoing issue for long time. He has had gastric emptying study which showed mildly delayed emptying. Will give trial of Reglan. No episodes of vomiting. Patient has episodes of hiccoughs at times. Continue reglan for now.  Anemia  Likely multifactorial with acute loss and secondary to chronic disease. Hgb stable. Repeat level in 1 week.  Hypertension with sinus tachycardia  Continue with home medications.  Pericardial effusion s/p pericardial window on 2/28  Stable. Incision site looks fine.  Dementia:  Continue namenda 10 mg   Hemiparesis affecting right side as late effect of stroke, Expressive aphasia:  Chronic findings. Stable.   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 His baseline seems to be 1.5-1.3.   Overall he is stable. He will be advanced to soft diet today and if he tolerates he will be discharged to SNF. Patient denies any complaints.   PERTINENT LABS:  The results of significant diagnostics from this hospitalization (including imaging, microbiology, ancillary and laboratory) are listed below for reference.    Microbiology: Recent Results (from the past 240 hour(s))  CLOSTRIDIUM DIFFICILE BY PCR     Status: None   Collection Time    11/17/12  1:20 PM      Result Value Range Status   C difficile by pcr NEGATIVE  NEGATIVE Final     Labs: Basic Metabolic Panel:  Recent Labs Lab 11/24/12 0004 11/24/12 0015 11/24/12 0352 11/25/12 1026 11/26/12 0450 11/26/12 1757 11/27/12 0620  NA 141 143 144  146* 142  --  141  K 4.0 4.0 4.4 4.5 4.2  --  3.9  CL 105 109 110 111 110  --  108  CO2 24  --  25 26 23   --  22  GLUCOSE 163* 162* 139* 125* 102*  --  97  BUN 37* 36* 38* 23 19  --  15  CREATININE 2.00* 1.90* 2.12* 1.62* 1.56*  --  1.54*  CALCIUM 9.3  --  8.9 9.0 8.8  --  8.4  MG  --   --   --   --   --  1.8  --    Liver Function Tests:  Recent Labs Lab 11/22/12 1558  11/24/12 0004  AST 18 18  ALT 12 10  ALKPHOS 59 48  BILITOT 0.2* 0.1*  PROT 8.0 6.8  ALBUMIN 3.5 3.1*    Recent Labs Lab 11/22/12 1558 11/24/12 0004  LIPASE 42 35   CBC:  Recent Labs Lab 11/22/12 1558 11/24/12 0004  11/25/12 1026 11/25/12 2119 11/26/12 0450 11/26/12 1140 11/27/12 0620  WBC 8.3 11.2*  --   --   --  7.0  --  5.3  NEUTROABS 6.5  --   --   --   --   --   --   --   HGB 12.5* 10.6*  < > 9.9* 9.1* 9.6* 9.6* 9.0*  HCT 37.7* 32.2*  < > 31.7* 29.1* 29.9* 30.7* 27.9*  MCV 80.2 78.7  --   --   --  81.3  --  79.0  PLT 352 384  --   --   --  364  --  368  < > = values in this interval not displayed.  Cardiac Enzymes:  Recent Labs Lab 11/22/12 1652 11/24/12 0004  TROPONINI <0.30 <0.30   BNP: BNP (last 3 results)  Recent Labs  11/12/12 2000  PROBNP 818.8*   IMAGING STUDIES Ct Abdomen Pelvis Wo Contrast  11/24/2012  *RADIOLOGY REPORT*  Clinical Data: Abdominal pain  CT ABDOMEN AND PELVIS WITHOUT CONTRAST  Technique:  Multidetector CT imaging of the abdomen and pelvis was performed following the standard protocol without intravenous contrast.  Comparison: 11/22/2012 radiographs, 11/10/2012 CT  Findings: Hiatal hernia.  NG tube terminates at the level of the hiatus.  Mild bibasilar opacities.  Heart size upper normal to mildly enlarged. Pericardial effusion has decreased in size, with minimal residual.  There is a small amount of mediastinal air. Correlate clinically for interval pericardiocentesis.  Organ abnormality/lesion detection is limited in the absence of intravenous contrast. Within this limitation, unremarkable liver, spleen, pancreas, adrenal glands, biliary system.  Lobular renal contours with incompletely characterized lesions, similar to recent prior.  The hydroureteronephrosis has decreased/resolved on the left.  No CT evidence for colitis. Nonspecific rectal wall thickening, similar to prior.  Small bowel loops are relatively decompressed.  Normal  appendix. No free intraperitoneal air or fluid.  No lymphadenopathy.  There is scattered atherosclerotic calcification of the aorta and its branches. No aneurysmal dilatation.  Thin-walled bladder.  Multilevel degenerative changes of the imaged spine. No acute or aggressive appearing osseous lesion.  Thoracolumbar curvature is unchanged.  There is a right femoral approach central venous catheter with tip near the right common iliac confluence.  IMPRESSION: Interval decrease in size of the pericardial effusion with development of a small amount of mediastinal air.  Correlate clinically for interval drainage.  Hiatal hernia.  NG tube tip just below the hiatus.  Bowel loops are relatively decompressed,  without evidence for obstruction.  Interval decreased dilatation of the left renal collecting system, with resolution of the hydroureteronephrosis.  Bilateral incompletely characterized renal lesions.  Nonspecific rectal wall thickening, similar to prior.   Original Report Authenticated By: Jearld Lesch, M.D.    Ct Abdomen Pelvis Wo Contrast  11/11/2012  *RADIOLOGY REPORT*  Clinical Data: Distended abdomen with abdominal pain.  CT ABDOMEN AND PELVIS WITHOUT CONTRAST  Technique:  Multidetector CT imaging of the abdomen and pelvis was performed following the standard protocol without intravenous contrast.  Comparison: 12/10/2009  Findings: Small bilateral pleural effusions with atelectasis or infiltration in both lung bases.  Moderate sized pericardial effusion.  Cardiac enlargement.  Moderate sized esophageal hiatal hernia.  The unenhanced appearance of the liver, spleen, gallbladder, pancreas, adrenal glands, and retroperitoneal lymph nodes is unremarkable.  Mild calcification of the abdominal aorta without aneurysm.  Multiple parenchymal cysts in the right kidney corresponding to the previous study.  Largest is in the upper pole and measures 1.7 cm.  There is a hyper dense lesion in the upper pole of the right  kidney posteriorly measuring 7 mm consistent with a hemorrhagic cyst.  No hydronephrosis of the right kidney.  The left kidney demonstrates marked hydronephrosis and ureterectasis with dilated and tortuous aorta down to the level of the bladder. There is marked parenchymal atrophy in the left kidney suggesting that this represents a longstanding process.  The appearance is similar to the previous study, allowing for technical differences. The stomach, small bowel, and colon are not abnormally distended. No abdominal ascites.  Pelvis:  There is marked distension of the bladder suggesting possible bladder outlet obstruction versus dysmotility.  The prostate gland is not appear to be enlarged.  The appearance is stable since the previous study.  No free or loculated pelvic fluid collections.  The rectum is decompressed but there is apparent thickening of the wall of the anorectal junction.  Was present previously.  This could represent chronic inflammatory process. Mass lesion can also have this appearance.  The appendix is normal. No diverticulitis.  Lumbar scoliosis convex towards the right.  Degenerative changes in the lumbar spine and hips.  IMPRESSION: Small bilateral pleural effusions with basilar atelectasis or infiltration.  Cardiac enlargement with pericardial effusion. Prominent pyelocaliectasis and ureterectasis of the left kidney with parenchymal atrophy.  Distended bladder without wall thickening.  Thickening of the wall of the anorectal junction. These findings are similar to previous study.  Clinical appearance of abdominal distension is likely to be due to the distended bladder.   Original Report Authenticated By: Burman Nieves, M.D.    Dg Chest 2 View  11/11/2012  *RADIOLOGY REPORT*  Clinical Data: Chest pain.  CHEST - 2 VIEW  Comparison: 10/23/2007 and prior chest radiographs  Findings: Cardiomegaly and peribronchial thickening again noted. Left basilar scarring/density is unchanged. There is no  evidence of pleural effusion, pneumothorax or pulmonary edema. No acute bony abnormalities are identified.  IMPRESSION: Cardiomegaly without evidence of acute cardiopulmonary disease.   Original Report Authenticated By: Harmon Pier, M.D.    Dg Abd 1 View  11/16/2012  *RADIOLOGY REPORT*  Clinical Data: With ileus follow up  ABDOMEN - 1 VIEW  Comparison: Prior abdominal radiographs 11/15/2012  Findings: Single frontal view the abdomen demonstrates a nasogastric tube with the tip in the prepyloric gastric antrum versus proximal duodenum. Slightly decreased gaseous distension of multiple loops of bowel in the left hemiabdomen.  No large free air identified.  Stable advanced dextroconvex scoliosis of the  thoracolumbar junction with multilevel degenerative changes throughout the spine.  IMPRESSION:  1.  Slight interval decrease in the underlying ileus pattern. 2.  The tip of nasogastric tube is in the prepyloric antrum or proximal duodenum.   Original Report Authenticated By: Malachy Moan, M.D.    Ct Head Wo Contrast  11/12/2012  *RADIOLOGY REPORT*  Clinical Data: 67 year old male with right hemipareses.  History of dementia, prior infarct  CT HEAD WITHOUT CONTRAST  Technique:  Contiguous axial images were obtained from the base of the skull through the vertex without contrast.  Comparison: None  Findings: Left hemispheric encephalomalacia noted. Chronic small vessel white matter ischemic changes are present. A 1.7 cm calcified area in the left periventricular region is likely chronic. No acute intracranial abnormalities are identified, including mass effect, hydrocephalus, extra-axial fluid collection, midline shift, hemorrhage, or acute infarction.  Left frontotemporal craniectomy noted.  IMPRESSION: No evidence of acute intracranial abnormality.  Left hemispheric encephalomalacia and chronic small vessel white matter ischemic changes.   Original Report Authenticated By: Harmon Pier, M.D.    Dg Chest Portable 1  View  11/24/2012  *RADIOLOGY REPORT*  Clinical Data: Chest pain  PORTABLE CHEST - 1 VIEW  Comparison: 11/22/2012 radiograph, 11/11/2012 CT.  Findings: Hypoaeration.  Prominent cardiomediastinal contours, similar to prior.  Retrocardiac process and small effusions not excluded.  No pneumothorax.  No interval osseous change.  IMPRESSION: Prominent cardiomediastinal contours, similar to prior. A pericardial effusion was noted on prior CT.  Retrocardiac opacity not excluded.   Original Report Authenticated By: Jearld Lesch, M.D.    Dg Chest Port 1 View  11/14/2012  *RADIOLOGY REPORT*  Clinical Data: Shortness of breath.  PORTABLE CHEST - 1 VIEW  Comparison: 11/10/2012  Findings: Very low lung volumes.  There is cardiomegaly with vascular congestion and bibasilar atelectasis.  Lung volumes have decreased.  No acute bony abnormality or visible effusions.  IMPRESSION: Very low lung volumes.  Cardiomegaly with vascular congestion and bibasilar atelectasis.   Original Report Authenticated By: Charlett Nose, M.D.    Dg Abd 2 Views  11/11/2012  *RADIOLOGY REPORT*  Clinical Data: Abdominal pain and vomiting.  ABDOMEN - 2 VIEW  Comparison: 01/15/2011 radiographs  Findings: Gas filled loops of colon and small bowel identified. No dilated loops of bowel are present. There is no evidence of pneumoperitoneum. No suspicious calcifications are noted. A severe thoracolumbar scoliosis is again identified.  IMPRESSION: Nonspecific nonobstructive bowel gas pattern - no evidence of pneumoperitoneum.   Original Report Authenticated By: Harmon Pier, M.D.    Dg Abd Acute W/chest  11/22/2012  *RADIOLOGY REPORT*  Clinical Data: Abdominal pain.  Possible GI bleed.  ACUTE ABDOMEN SERIES (ABDOMEN 2 VIEW & CHEST 1 VIEW)  Comparison: PA and lateral chest 11/10/2012.  Two views of the abdomen 11/17/2012. CT abdomen and pelvis 11/11/2012.  Findings: There is marked enlargement cardiopericardial silhouette. Note is made the patient had a large  pericardial effusion on the comparison CT scan.  Left basilar atelectasis is noted.  Right lung is clear.  Two views the abdomen show no free intraperitoneal air.  There is gaseous distention of the colon throughout.  Severe convex right scoliosis is noted.  IMPRESSION:  1.  Enlarged cardiopericardial silhouette is stable in appearance. Note is made the patient pericardial effusion and on the comparison CT. 2.  Gaseous distention of the colon compatible with ileus. Negative for free air.   Original Report Authenticated By: Holley Dexter, M.D.    Dg Abd Portable 1v  11/15/2012  *RADIOLOGY REPORT*  Clinical Data: Evaluate for free air.  PORTABLE ABDOMEN - 1 VIEW  Comparison: Supine abdominal image 11/15/2012.  Findings: Decubitus view demonstrates continued mild diffuse gaseous distention of bowel.  No free air on decubitus view.  IMPRESSION: No free air.   Original Report Authenticated By: Charlett Nose, M.D.    Dg Abd Portable 1v  11/15/2012  *RADIOLOGY REPORT*  Clinical Data: Ileus  PORTABLE ABDOMEN - 1 VIEW  Comparison: 11/14/2012  Findings: Gas distended large and small bowel.  Findings may represent an ileus without significant change from prior examination. Distal obstructing lesion not excluded.  Bowel walls well visualized which may be related to approximation of gas distended loops.  Free air cannot be excluded.  Decubitus view may be considered.  Nasogastric tube may be in place projecting to the right of midline.  Significant scoliosis.  IMPRESSION: Persistent gas distended bowel which may reflect ileus.  Distal obstructing lesion not excluded.  Free air not excluded and decubitus view recommended for further delineation.  Critical Value/emergent results were called by telephone at the time of interpretation on 11/15/2012 at 8:05 a.m. to [facility] the patient's nurse , who verbally acknowledged these results.   Original Report Authenticated By: Lacy Duverney, M.D.    Dg Abd Portable 1v  11/14/2012   *RADIOLOGY REPORT*  Clinical Data: Ileus  PORTABLE ABDOMEN - 1 VIEW  Comparison: CT abdomen pelvis dated 11/11/2012  Findings: Gaseous distention of small and large bowel, suggesting diffuse adynamic ileus.  Thoracolumbar scoliosis.  IMPRESSION: Suspected diffuse adynamic ileus.   Original Report Authenticated By: Charline Bills, M.D.    Dg Abd Portable 2v  11/17/2012  *RADIOLOGY REPORT*  Clinical Data: Ileus versus small bowel obstruction, follow-up  PORTABLE ABDOMEN - 2 VIEW  Comparison: Portable exam 12/07 hours compared to 11/16/2012  Findings: Nasogastric tube projects over stomach. Abdominal deformity due to the thoracolumbar scoliosis. Air filled nondistended loops of large and small bowel in abdomen. No definite bowel dilatation, bowel wall thickening, or free intraperitoneal air. Degenerative changes thoracolumbar spine. Left pelvic phleboliths.  IMPRESSION: Nonobstructive bowel gas pattern. No bowel dilatation currently identified.   Original Report Authenticated By: Ulyses Southward, M.D.     DISCHARGE EXAMINATION: Filed Vitals:   11/26/12 0930 11/26/12 1500 11/26/12 2300 11/27/12 0534  BP: 166/88 132/76 119/64 118/72  Pulse: 106 93 90 87  Temp:  98.5 F (36.9 C) 98.4 F (36.9 C) 98.1 F (36.7 C)  TempSrc:  Oral Oral Oral  Resp:  20 20 20   Height:      Weight:      SpO2:  96% 96% 99%   General appearance: alert, cooperative, appears older than stated age and no distress  Head: Normocephalic, without obvious abnormality, atraumatic  Resp: clear to auscultation bilaterally  Cardio: regular rate and rhythm, S1, S2 normal, no murmur, click, rub or gallop  GI: soft, non-tender; bowel sounds normal; no masses, no organomegaly  Extremities: extremities normal, atraumatic, no cyanosis or edema  Neurologic: Old right hemiparesis   DISPOSITION: SNF  Discharge Orders   Future Orders Complete By Expires     Activity as tolerated - No restrictions  As directed     Discharge diet:  As  directed     Comments:      Dysphagia 3 diet    Discharge instructions  As directed     Comments:      Follow up with your PCP      Current Discharge Medication List  START taking these medications   Details  metoCLOPramide (REGLAN) 5 MG tablet Take 1 tablet (5 mg total) by mouth 4 (four) times daily -  before meals and at bedtime.    sucralfate (CARAFATE) 1 GM/10ML suspension Take 10 mLs (1 g total) by mouth 4 (four) times daily -  with meals and at bedtime. For 2 weeks Qty: 420 mL      CONTINUE these medications which have CHANGED   Details  omeprazole (PRILOSEC) 20 MG capsule Take 1 capsule (20 mg total) by mouth 2 (two) times daily.      CONTINUE these medications which have NOT CHANGED   Details  amLODipine (NORVASC) 10 MG tablet Take 10 mg by mouth daily.    citalopram (CELEXA) 10 MG tablet Take 5 mg by mouth daily.    docusate sodium (COLACE) 100 MG capsule Take 100 mg by mouth 2 (two) times daily.    hydrALAZINE (APRESOLINE) 50 MG tablet Take 50 mg by mouth 3 (three) times daily.    lactulose (CHRONULAC) 10 GM/15ML solution Take 30 mLs by mouth 2 (two) times daily.    lisinopril (PRINIVIL,ZESTRIL) 10 MG tablet Take 1 tablet (10 mg total) by mouth daily.    memantine (NAMENDA) 10 MG tablet Take 10 mg by mouth 2 (two) times daily.    metoprolol (LOPRESSOR) 50 MG tablet Take 50 mg by mouth every evening.    metoprolol succinate (TOPROL-XL) 200 MG 24 hr tablet Take 1 tablet (200 mg total) by mouth daily. Take with or immediately following a meal. Qty: 30 tablet, Refills: 0    simvastatin (ZOCOR) 40 MG tablet Take 40 mg by mouth every evening.    tamsulosin (FLOMAX) 0.4 MG CAPS Take 0.4 mg by mouth daily.      STOP taking these medications     bisacodyl (BISACODYL) 5 MG EC tablet      ondansetron (ZOFRAN) 4 MG tablet        Follow-up Information   Follow up with Physician at SNF In 1 week.      TOTAL DISCHARGE TIME: 35 mins  [facility]  [facility] Pager [phone]  11/27/2012, 8:37 AM

## 2012-11-27 NOTE — Progress Notes (Signed)
Pt is being d/c today, Ptar will transfer pt to [facility] today. No other needs for this pt.  Sherald Barge, LCSW-A Clinical Social Worker [phone]

## 2012-11-29 ENCOUNTER — Emergency Department (HOSPITAL_COMMUNITY): Payer: Medicare Other

## 2012-11-29 ENCOUNTER — Inpatient Hospital Stay (HOSPITAL_COMMUNITY)
Admission: EM | Admit: 2012-11-29 | Discharge: 2012-12-04 | DRG: 391 | Disposition: A | Discharge status: Skilled Nursing Facility | Payer: Medicare Other | Attending: Internal Medicine | Admitting: Internal Medicine

## 2012-11-29 ENCOUNTER — Encounter (HOSPITAL_COMMUNITY): Payer: Self-pay | Admitting: *Deleted

## 2012-11-29 ENCOUNTER — Inpatient Hospital Stay (HOSPITAL_COMMUNITY): Payer: Medicare Other

## 2012-11-29 DIAGNOSIS — K449 Diaphragmatic hernia without obstruction or gangrene: Secondary | ICD-10-CM | POA: Diagnosis present

## 2012-11-29 DIAGNOSIS — I3139 Other pericardial effusion (noninflammatory): Secondary | ICD-10-CM | POA: Diagnosis present

## 2012-11-29 DIAGNOSIS — R339 Retention of urine, unspecified: Secondary | ICD-10-CM | POA: Diagnosis present

## 2012-11-29 DIAGNOSIS — I129 Hypertensive chronic kidney disease with stage 1 through stage 4 chronic kidney disease, or unspecified chronic kidney disease: Secondary | ICD-10-CM | POA: Diagnosis present

## 2012-11-29 DIAGNOSIS — I69920 Aphasia following unspecified cerebrovascular disease: Secondary | ICD-10-CM

## 2012-11-29 DIAGNOSIS — D62 Acute posthemorrhagic anemia: Secondary | ICD-10-CM | POA: Diagnosis present

## 2012-11-29 DIAGNOSIS — I69351 Hemiplegia and hemiparesis following cerebral infarction affecting right dominant side: Secondary | ICD-10-CM

## 2012-11-29 DIAGNOSIS — R4701 Aphasia: Secondary | ICD-10-CM | POA: Diagnosis present

## 2012-11-29 DIAGNOSIS — I498 Other specified cardiac arrhythmias: Secondary | ICD-10-CM | POA: Diagnosis present

## 2012-11-29 DIAGNOSIS — K2970 Gastritis, unspecified, without bleeding: Secondary | ICD-10-CM | POA: Diagnosis present

## 2012-11-29 DIAGNOSIS — J189 Pneumonia, unspecified organism: Secondary | ICD-10-CM | POA: Diagnosis present

## 2012-11-29 DIAGNOSIS — K5900 Constipation, unspecified: Secondary | ICD-10-CM | POA: Diagnosis present

## 2012-11-29 DIAGNOSIS — E785 Hyperlipidemia, unspecified: Secondary | ICD-10-CM | POA: Diagnosis present

## 2012-11-29 DIAGNOSIS — J690 Pneumonitis due to inhalation of food and vomit: Secondary | ICD-10-CM | POA: Diagnosis present

## 2012-11-29 DIAGNOSIS — K920 Hematemesis: Secondary | ICD-10-CM | POA: Diagnosis present

## 2012-11-29 DIAGNOSIS — I69959 Hemiplegia and hemiparesis following unspecified cerebrovascular disease affecting unspecified side: Secondary | ICD-10-CM

## 2012-11-29 DIAGNOSIS — I639 Cerebral infarction, unspecified: Secondary | ICD-10-CM | POA: Diagnosis present

## 2012-11-29 DIAGNOSIS — I319 Disease of pericardium, unspecified: Secondary | ICD-10-CM | POA: Diagnosis present

## 2012-11-29 DIAGNOSIS — N400 Enlarged prostate without lower urinary tract symptoms: Secondary | ICD-10-CM | POA: Diagnosis present

## 2012-11-29 DIAGNOSIS — I10 Essential (primary) hypertension: Secondary | ICD-10-CM | POA: Diagnosis present

## 2012-11-29 DIAGNOSIS — F0390 Unspecified dementia without behavioral disturbance: Secondary | ICD-10-CM | POA: Diagnosis present

## 2012-11-29 DIAGNOSIS — K208 Other esophagitis without bleeding: Principal | ICD-10-CM | POA: Diagnosis present

## 2012-11-29 DIAGNOSIS — K219 Gastro-esophageal reflux disease without esophagitis: Secondary | ICD-10-CM | POA: Diagnosis present

## 2012-11-29 DIAGNOSIS — N183 Chronic kidney disease, stage 3 unspecified: Secondary | ICD-10-CM | POA: Diagnosis present

## 2012-11-29 LAB — CBC
HCT: 24.5 % — ABNORMAL LOW (ref 39.0–52.0)
HCT: 29.3 % — ABNORMAL LOW (ref 39.0–52.0)
Hemoglobin: 9.2 g/dL — ABNORMAL LOW (ref 13.0–17.0)
MCH: 24.8 pg — ABNORMAL LOW (ref 26.0–34.0)
MCH: 25.3 pg — ABNORMAL LOW (ref 26.0–34.0)
MCHC: 31.4 g/dL (ref 30.0–36.0)
MCV: 79.8 fL (ref 78.0–100.0)
Platelets: 405 10*3/uL — ABNORMAL HIGH (ref 150–400)
RBC: 3.07 MIL/uL — ABNORMAL LOW (ref 4.22–5.81)
RBC: 3.64 MIL/uL — ABNORMAL LOW (ref 4.22–5.81)
WBC: 7.5 10*3/uL (ref 4.0–10.5)

## 2012-11-29 LAB — CBC WITH DIFFERENTIAL/PLATELET
Basophils Relative: 0 % (ref 0–1)
HCT: 30.8 % — ABNORMAL LOW (ref 39.0–52.0)
Hemoglobin: 9.8 g/dL — ABNORMAL LOW (ref 13.0–17.0)
MCH: 25.5 pg — ABNORMAL LOW (ref 26.0–34.0)
MCHC: 31.8 g/dL (ref 30.0–36.0)
Monocytes Absolute: 0.6 10*3/uL (ref 0.1–1.0)
Monocytes Relative: 6 % (ref 3–12)
Neutro Abs: 9.9 10*3/uL — ABNORMAL HIGH (ref 1.7–7.7)

## 2012-11-29 LAB — COMPREHENSIVE METABOLIC PANEL
Albumin: 3.1 g/dL — ABNORMAL LOW (ref 3.5–5.2)
BUN: 22 mg/dL (ref 6–23)
Chloride: 101 mEq/L (ref 96–112)
Creatinine, Ser: 1.71 mg/dL — ABNORMAL HIGH (ref 0.50–1.35)
GFR calc Af Amer: 46 mL/min — ABNORMAL LOW (ref >90)
GFR calc non Af Amer: 40 mL/min — ABNORMAL LOW (ref >90)
Total Bilirubin: 0.2 mg/dL — ABNORMAL LOW (ref 0.3–1.2)

## 2012-11-29 LAB — URINALYSIS, ROUTINE W REFLEX MICROSCOPIC
Bilirubin Urine: NEGATIVE
Glucose, UA: NEGATIVE mg/dL
Ketones, ur: NEGATIVE mg/dL
Leukocytes, UA: NEGATIVE
Nitrite: NEGATIVE
Specific Gravity, Urine: 1.017 (ref 1.005–1.030)
pH: 5.5 (ref 5.0–8.0)

## 2012-11-29 LAB — LACTIC ACID, PLASMA
Lactic Acid, Venous: 2.8 mmol/L — ABNORMAL HIGH (ref 0.5–2.2)
Lactic Acid, Venous: 2.3 mmol/L — ABNORMAL HIGH (ref 0.5–2.2)

## 2012-11-29 LAB — OCCULT BLOOD GASTRIC / DUODENUM (SPECIMEN CUP)
Occult Blood, Gastric: POSITIVE — AB
pH, Gastric: 2

## 2012-11-29 LAB — ABO/RH: ABO/RH(D): B POS

## 2012-11-29 LAB — LIPASE, BLOOD: Lipase: 34 U/L (ref 11–59)

## 2012-11-29 MED ORDER — MEMANTINE HCL 10 MG PO TABS
10.0000 mg | ORAL_TABLET | Freq: Two times a day (BID) | ORAL | Status: DC
  Administered 2012-11-29 – 2012-12-04 (×10): 10 mg via ORAL
  Filled 2012-11-29 (×11): qty 1

## 2012-11-29 MED ORDER — METOPROLOL SUCCINATE ER 100 MG PO TB24
100.0000 mg | ORAL_TABLET | Freq: Every day | ORAL | Status: DC
  Administered 2012-11-29 – 2012-11-30 (×2): 100 mg via ORAL
  Filled 2012-11-29 (×3): qty 1

## 2012-11-29 MED ORDER — ACETAMINOPHEN 650 MG RE SUPP: 650.0000 mg | Freq: Four times a day (QID) | RECTAL | Status: DC | PRN

## 2012-11-29 MED ORDER — LACTULOSE 10 GM/15ML PO SOLN
20.0000 g | Freq: Two times a day (BID) | ORAL | Status: DC
  Administered 2012-11-29 – 2012-12-04 (×10): 20 g via ORAL
  Filled 2012-11-29 (×11): qty 30

## 2012-11-29 MED ORDER — DEXTROSE 5 % IV SOLN
2.0000 g | INTRAVENOUS | Status: DC
  Administered 2012-11-30 – 2012-12-01 (×3): 2 g via INTRAVENOUS
  Filled 2012-11-29 (×3): qty 2

## 2012-11-29 MED ORDER — ONDANSETRON HCL 4 MG/2ML IJ SOLN
4.0000 mg | Freq: Once | INTRAMUSCULAR | Status: AC
  Administered 2012-11-29: 4 mg via INTRAVENOUS
  Filled 2012-11-29: qty 2

## 2012-11-29 MED ORDER — ONDANSETRON HCL 4 MG/2ML IJ SOLN
4.0000 mg | Freq: Three times a day (TID) | INTRAMUSCULAR | Status: DC | PRN
  Administered 2012-11-29: 4 mg via INTRAVENOUS
  Filled 2012-11-29: qty 2

## 2012-11-29 MED ORDER — METOCLOPRAMIDE HCL 5 MG/ML IJ SOLN
10.0000 mg | Freq: Once | INTRAMUSCULAR | Status: AC
  Administered 2012-11-29: 10 mg via INTRAVENOUS
  Filled 2012-11-29: qty 2

## 2012-11-29 MED ORDER — SIMVASTATIN 40 MG PO TABS
40.0000 mg | ORAL_TABLET | Freq: Every day | ORAL | Status: DC
  Administered 2012-11-29 – 2012-11-30 (×2): 40 mg via ORAL
  Filled 2012-11-29 (×3): qty 1

## 2012-11-29 MED ORDER — PROMETHAZINE HCL 25 MG/ML IJ SOLN
25.0000 mg | Freq: Once | INTRAMUSCULAR | Status: AC
Start: 2012-11-29 — End: 2012-11-29
  Administered 2012-11-29: 25 mg via INTRAVENOUS
  Filled 2012-11-29: qty 1

## 2012-11-29 MED ORDER — SODIUM CHLORIDE 0.9 % IV BOLUS (SEPSIS)
1000.0000 mL | Freq: Once | INTRAVENOUS | Status: AC
  Administered 2012-11-29: 1000 mL via INTRAVENOUS

## 2012-11-29 MED ORDER — SODIUM CHLORIDE 0.9 % IV SOLN
Freq: Once | INTRAVENOUS | Status: AC
  Administered 2012-11-29: 10:00:00 via INTRAVENOUS

## 2012-11-29 MED ORDER — CITALOPRAM HYDROBROMIDE 10 MG PO TABS
5.0000 mg | ORAL_TABLET | Freq: Every day | ORAL | Status: DC
  Administered 2012-11-29 – 2012-11-30 (×2): 5 mg via ORAL
  Filled 2012-11-29 (×2): qty 1

## 2012-11-29 MED ORDER — SUCRALFATE 1 GM/10ML PO SUSP
1.0000 g | Freq: Three times a day (TID) | ORAL | Status: DC
  Administered 2012-11-29 – 2012-11-30 (×6): 1 g via ORAL
  Filled 2012-11-29 (×7): qty 10

## 2012-11-29 MED ORDER — LEVOFLOXACIN IN D5W 750 MG/150ML IV SOLN
750.0000 mg | INTRAVENOUS | Status: DC
  Administered 2012-11-29 – 2012-12-01 (×2): 750 mg via INTRAVENOUS
  Filled 2012-11-29 (×3): qty 150

## 2012-11-29 MED ORDER — SODIUM CHLORIDE 0.9 % IV SOLN
INTRAVENOUS | Status: DC
  Administered 2012-11-29 – 2012-12-01 (×2): via INTRAVENOUS

## 2012-11-29 MED ORDER — PANTOPRAZOLE SODIUM 40 MG IV SOLR
40.0000 mg | Freq: Once | INTRAVENOUS | Status: AC
  Administered 2012-11-29: 40 mg via INTRAVENOUS
  Filled 2012-11-29: qty 40

## 2012-11-29 MED ORDER — SODIUM CHLORIDE 0.9 % IJ SOLN
3.0000 mL | Freq: Two times a day (BID) | INTRAMUSCULAR | Status: DC
  Administered 2012-11-29: 23:00:00 via INTRAVENOUS
  Administered 2012-12-01 – 2012-12-04 (×6): 3 mL via INTRAVENOUS

## 2012-11-29 MED ORDER — ONDANSETRON HCL 4 MG/2ML IJ SOLN
4.0000 mg | Freq: Four times a day (QID) | INTRAMUSCULAR | Status: DC | PRN
  Administered 2012-12-01 (×2): 4 mg via INTRAVENOUS
  Filled 2012-11-29 (×2): qty 2

## 2012-11-29 MED ORDER — SODIUM CHLORIDE 0.9 % IV SOLN: INTRAVENOUS | Status: DC

## 2012-11-29 MED ORDER — ACETAMINOPHEN 160 MG/5ML PO SOLN
650.0000 mg | Freq: Once | ORAL | Status: AC
  Administered 2012-11-29: 650 mg via ORAL
  Filled 2012-11-29: qty 20.3

## 2012-11-29 MED ORDER — PANTOPRAZOLE SODIUM 40 MG IV SOLR
40.0000 mg | Freq: Two times a day (BID) | INTRAVENOUS | Status: DC
  Administered 2012-11-29 – 2012-11-30 (×3): 40 mg via INTRAVENOUS
  Filled 2012-11-29 (×5): qty 40

## 2012-11-29 MED ORDER — HYDRALAZINE HCL 25 MG PO TABS
25.0000 mg | ORAL_TABLET | Freq: Three times a day (TID) | ORAL | Status: DC
  Administered 2012-11-29 – 2012-12-01 (×5): 25 mg via ORAL
  Filled 2012-11-29 (×8): qty 1

## 2012-11-29 MED ORDER — VANCOMYCIN HCL 1000 MG IV SOLR
750.0000 mg | Freq: Two times a day (BID) | INTRAVENOUS | Status: DC
  Administered 2012-11-29 – 2012-11-30 (×2): 750 mg via INTRAVENOUS
  Filled 2012-11-29 (×3): qty 750

## 2012-11-29 MED ORDER — ACETAMINOPHEN 325 MG PO TABS: 650.0000 mg | ORAL_TABLET | Freq: Four times a day (QID) | ORAL | Status: DC | PRN

## 2012-11-29 NOTE — Progress Notes (Signed)
Rt stick for labs per MD order.

## 2012-11-29 NOTE — ED Notes (Signed)
Per ems: pt from [facility] living facility, called out for pt c/o of n/v x1 week. Staff reports seeing "coffee ground emesis" today, however ems does not believe the emesis was dark. Pt vomited 4 times since this morning. Has been seen at [facility] over past week, staff nor pt know was dx was. Sent to [facility] for further evaluation. bp 140/90, pulse 80, respirations 16

## 2012-11-29 NOTE — ED Notes (Signed)
JXB:[id number]<GN> Expected date:11/29/12<BR> Expected time: 7:59 AM<BR> Means of arrival:Ambulance<BR> Comments:<BR> vomiting

## 2012-11-29 NOTE — Progress Notes (Signed)
ANTIBIOTIC CONSULT NOTE - INITIAL  Pharmacy Consult for Vancomycin, Levaquin, and Cefepime Indication: pneumonia  No Known Allergies  Patient Measurements: Height: 5\' 10"  (177.8 cm) Weight: 189 lb 13.1 oz (86.1 kg) IBW/kg (Calculated) : 73 Adjusted Body Weight:   Vital Signs: Temp: 98.6 F (37 C) (03/16 1757) Temp src: Rectal (03/16 1335) BP: 133/58 mmHg (03/16 1757) Pulse Rate: 118 (03/16 1757) Intake/Output from previous day:   Intake/Output from this shift:    Labs:  Recent Labs  11/27/12 0620 11/29/12 1000 11/29/12 1550  WBC 5.3 11.2* 10.6*  HGB 9.0* 9.8* 9.2*  PLT 368 527* 373  CREATININE 1.54* 1.71*  --    Estimated Creatinine Clearance: 43.3 ml/min (by C-G formula based on Cr of 1.71). No results found for this basename: VANCOTROUGH, Leodis Binet, VANCORANDOM, GENTTROUGH, GENTPEAK, GENTRANDOM, TOBRATROUGH, TOBRAPEAK, TOBRARND, AMIKACINPEAK, AMIKACINTROU, AMIKACIN,  in the last 72 hours   Microbiology: Recent Results (from the past 720 hour(s))  MRSA PCR SCREENING     Status: None   Collection Time    11/11/12  1:24 PM      Result Value Range Status   MRSA by PCR NEGATIVE  NEGATIVE Final   Comment:            The GeneXpert MRSA Assay (FDA     approved for NASAL specimens     only), is one component of a     comprehensive MRSA colonization     surveillance program. It is not     intended to diagnose MRSA     infection nor to guide or     monitor treatment for     MRSA infections.  AFB CULTURE WITH SMEAR     Status: None   Collection Time    11/13/12 12:00 PM      Result Value Range Status   Specimen Description FLUID PERICARDIAL   Final   Special Requests NONE   Final   ACID FAST SMEAR NO ACID FAST BACILLI SEEN   Final   Culture     Final   Value: CULTURE WILL BE EXAMINED FOR 6 WEEKS BEFORE ISSUING A FINAL REPORT   Report Status PENDING   Incomplete  FUNGUS CULTURE W SMEAR     Status: None   Collection Time    11/13/12 12:00 PM      Result Value  Range Status   Specimen Description FLUID PERICARDIAL   Final   Special Requests NONE   Final   Fungal Smear NO YEAST OR FUNGAL ELEMENTS SEEN   Final   Culture CULTURE IN PROGRESS FOR FOUR WEEKS   Final   Report Status PENDING   Incomplete  BODY FLUID CULTURE     Status: None   Collection Time    11/13/12 12:00 PM      Result Value Range Status   Specimen Description FLUID PERICARDIAL   Final   Special Requests NONE   Final   Gram Stain     Final   Value: NO WBC SEEN     NO ORGANISMS SEEN   Culture NO GROWTH 3 DAYS   Final   Report Status 11/16/2012 FINAL   Final  CLOSTRIDIUM DIFFICILE BY PCR     Status: None   Collection Time    11/17/12  1:20 PM      Result Value Range Status   C difficile by pcr NEGATIVE  NEGATIVE Final  MRSA PCR SCREENING     Status: None   Collection Time  11/29/12  4:37 PM      Result Value Range Status   MRSA by PCR NEGATIVE  NEGATIVE Final   Comment:            The GeneXpert MRSA Assay (FDA     approved for NASAL specimens     only), is one component of a     comprehensive MRSA colonization     surveillance program. It is not     intended to diagnose MRSA     infection nor to guide or     monitor treatment for     MRSA infections.    Medical History: Past Medical History  Diagnosis Date  . Dementia   . Stroke   . Hydronephrosis   . Hypertension   . Hyperlipemia   . GERD (gastroesophageal reflux disease)   . BPH (benign prostatic hyperplasia)     Medications:  Scheduled:  . [COMPLETED] sodium chloride   Intravenous Once  . [COMPLETED] acetaminophen (TYLENOL) oral liquid 160 mg/5 mL  650 mg Oral Once  . [START ON 11/30/2012] ceFEPime (MAXIPIME) IV  2 g Intravenous Q24H  . citalopram  5 mg Oral Daily  . hydrALAZINE  25 mg Oral Q8H  . lactulose  20 g Oral BID  . levofloxacin (LEVAQUIN) IV  750 mg Intravenous Q48H  . memantine  10 mg Oral BID  . [COMPLETED] metoCLOPramide (REGLAN) injection  10 mg Intravenous Once  . metoprolol  succinate  100 mg Oral Q breakfast  . [COMPLETED] ondansetron (ZOFRAN) IV  4 mg Intravenous Once  . [COMPLETED] pantoprazole (PROTONIX) IV  40 mg Intravenous Once  . pantoprazole (PROTONIX) IV  40 mg Intravenous Q12H  . [COMPLETED] promethazine  25 mg Intravenous Once  . simvastatin  40 mg Oral q1800  . [COMPLETED] sodium chloride  1,000 mL Intravenous Once  . sodium chloride  3 mL Intravenous Q12H  . sucralfate  1 g Oral TID WC & HS  . vancomycin  750 mg Intravenous Q12H  . [DISCONTINUED] sodium chloride   Intravenous STAT   Assessment: 67 yo M with suspected pneumonia. On Xray--LLL opacity c/w pneumonia  Goal of Therapy:  Vancomycin trough level 15-20 mcg/ml  Plan:  1. ) begin with Levaquin 750mg  IV q 48 hours (based on CrCl=2ml/min) 2.) Cefepime 2 Gm IV q 24 hours 3.) Vancomycin 750mg  IV q 12 hours Measure antibiotic drug levels at steady state Follow up culture results  Loletta Specter 11/29/2012,9:32 PM

## 2012-11-29 NOTE — H&P (Addendum)
[facility] History and Physical  [name] FAO:[id number] DOB: [birth year] DOA: 11/29/2012  Referring physician: EDP PCP: No primary provider on file.   Chief Complaint: hematemesis  HPI: [name] is a 67 y.o. male with PMH of CVA, aphasia, R sided hemiparesis, dementia, HTN, h/o pericardial effusion s/p pericardial window was just discharged from [facility] 2 days ago after evaluation for hematemesis, he had an EGD which revealed ulcerative esophagitis, biopsies revealed benign inflammation. He was discharged back to SNF on 3/14 after tolerating a diet, pt is unable to provide a meaningful hsitory. I called the SNF and they report that he threw up 4 times today and his vomitus was coffee ground, he reportedly vomited yesterday as well. He reports mild abdominal pain, and some cough. Upon evaluation in the ER, he is noted to be mildly tachycardic, Hb stable and unchanged from prior.   Review of Systems:  The patient denies anorexia, fever, weight loss,, vision loss, decreased hearing, hoarseness, chest pain, syncope, dyspnea on exertion, peripheral edema, balance deficits, hemoptysis, abdominal pain, melena, hematochezia, severe indigestion/heartburn, hematuria, incontinence, genital sores, muscle weakness, suspicious skin lesions, transient blindness, difficulty walking, depression, unusual weight change, abnormal bleeding, enlarged lymph nodes, angioedema, and breast masses.    Past Medical History  Diagnosis Date  . Dementia   . Stroke   . Hydronephrosis   . Hypertension   . Hyperlipemia   . GERD (gastroesophageal reflux disease)   . BPH (benign prostatic hyperplasia)    Past Surgical History  Procedure Laterality Date  . Esophagogastroduodenoscopy N/A 11/11/2012    Procedure: ESOPHAGOGASTRODUODENOSCOPY (EGD);  Surgeon: [name], MD;  Location: [facility] ENDOSCOPY;  Service: Endoscopy;  Laterality: N/A;  . Subxyphoid pericardial window N/A 11/13/2012     Procedure: SUBXYPHOID PERICARDIAL WINDOW;  Surgeon: Delight Ovens, MD;  Location: [facility] OR;  Service: Thoracic;  Laterality: N/A;  . Esophagogastroduodenoscopy N/A 11/25/2012    Procedure: ESOPHAGOGASTRODUODENOSCOPY (EGD);  Surgeon: Vertell Novak., MD;  Location: [facility] ENDOSCOPY;  Service: Endoscopy;  Laterality: N/A;   Social History:  reports that he has never smoked. He has never used smokeless tobacco. He reports that he does not drink alcohol or use illicit drugs. Lives at SNF  No Known Allergies  Family history: unable to obtain based on pts current mentation  Prior to Admission medications   Medication Sig Start Date End Date Taking? Authorizing Provider  amLODipine (NORVASC) 10 MG tablet Take 10 mg by mouth daily.   Yes Historical Provider, MD  citalopram (CELEXA) 10 MG tablet Take 5 mg by mouth daily.   Yes Historical Provider, MD  docusate sodium (COLACE) 100 MG capsule Take 100 mg by mouth 2 (two) times daily.   Yes Historical Provider, MD  hydrALAZINE (APRESOLINE) 50 MG tablet Take 50 mg by mouth 3 (three) times daily.   Yes Historical Provider, MD  lactulose (CHRONULAC) 10 GM/15ML solution Take 30 mLs by mouth 2 (two) times daily.   Yes Historical Provider, MD  lisinopril (PRINIVIL,ZESTRIL) 10 MG tablet Take 1 tablet (10 mg total) by mouth daily. 11/20/12  Yes Rhetta Mura, MD  memantine (NAMENDA) 10 MG tablet Take 10 mg by mouth 2 (two) times daily.   Yes Historical Provider, MD  metoCLOPramide (REGLAN) 5 MG tablet Take 1 tablet (5 mg total) by mouth 4 (four) times daily -  before meals and at bedtime. 11/27/12  Yes Osvaldo Shipper, MD  metoprolol (LOPRESSOR) 50 MG tablet Take 50 mg by mouth every evening.  Yes Historical Provider, MD  metoprolol succinate (TOPROL-XL) 200 MG 24 hr tablet Take 1 tablet (200 mg total) by mouth daily. Take with or immediately following a meal. 11/20/12  Yes Rhetta Mura, MD  omeprazole (PRILOSEC) 20 MG capsule Take 1 capsule (20 mg total)  by mouth 2 (two) times daily. 11/27/12  Yes Osvaldo Shipper, MD  simvastatin (ZOCOR) 40 MG tablet Take 40 mg by mouth every evening.   Yes Historical Provider, MD  sucralfate (CARAFATE) 1 GM/10ML suspension Take 10 mLs (1 g total) by mouth 4 (four) times daily -  with meals and at bedtime. For 2 weeks 11/27/12  Yes Osvaldo Shipper, MD  tamsulosin (FLOMAX) 0.4 MG CAPS Take 0.4 mg by mouth daily.   Yes Historical Provider, MD   Physical Exam: Filed Vitals:   11/29/12 1330 11/29/12 1335 11/29/12 1400 11/29/12 1430  BP: 129/68 129/68 112/54 100/56  Pulse: 135 119 119 128  Temp:  101 F (38.3 C)    TempSrc:  Rectal    Resp: 23 23 21 23   SpO2: 98% 97% 98% 96%     General: drowsy, arousible, answers some questions appropriately, otherwise confused, aphasic  HEENT: PERRLA, EOMI, no JVD  Cardiovascular: S1S2/RRR, mildly tachycardic  Chest with linear incision of pericardial window, C/D/I  Respiratory: CTAB  Abdomen: soft, distended, mild lower quadrant tenderness, BS present  Skin: no rashes or skin breakdown  Psychiatric: unable to assess  Neurologic: RUE spastic, moves other extremities  Labs on Admission:  Basic Metabolic Panel:  Recent Labs Lab 11/24/12 0352 11/25/12 1026 11/26/12 0450 11/26/12 1757 11/27/12 0620 11/29/12 1000  NA 144 146* 142  --  141 136  K 4.4 4.5 4.2  --  3.9 3.6  CL 110 111 110  --  108 101  CO2 25 26 23   --  22 18*  GLUCOSE 139* 125* 102*  --  97 162*  BUN 38* 23 19  --  15 22  CREATININE 2.12* 1.62* 1.56*  --  1.54* 1.71*  CALCIUM 8.9 9.0 8.8  --  8.4 8.7  MG  --   --   --  1.8  --   --    Liver Function Tests:  Recent Labs Lab 11/22/12 1558 11/24/12 0004 11/29/12 1000  AST 18 18 12   ALT 12 10 9   ALKPHOS 59 48 52  BILITOT 0.2* 0.1* 0.2*  PROT 8.0 6.8 6.5  ALBUMIN 3.5 3.1* 3.1*    Recent Labs Lab 11/22/12 1558 11/24/12 0004 11/29/12 1000  LIPASE 42 35 34   No results found for this basename: AMMONIA,  in the last 168  hours CBC:  Recent Labs Lab 11/22/12 1558 11/24/12 0004  11/25/12 2119 11/26/12 0450 11/26/12 1140 11/27/12 0620 11/29/12 1000  WBC 8.3 11.2*  --   --  7.0  --  5.3 11.2*  NEUTROABS 6.5  --   --   --   --   --   --  9.9*  HGB 12.5* 10.6*  < > 9.1* 9.6* 9.6* 9.0* 9.8*  HCT 37.7* 32.2*  < > 29.1* 29.9* 30.7* 27.9* 30.8*  MCV 80.2 78.7  --   --  81.3  --  79.0 80.0  PLT 352 384  --   --  364  --  368 527*  < > = values in this interval not displayed. Cardiac Enzymes:  Recent Labs Lab 11/22/12 1652 11/24/12 0004 11/29/12 1000  TROPONINI <0.30 <0.30 <0.30    BNP (last 3 results)  Recent Labs  11/12/12 2000  PROBNP 818.8*   CBG: No results found for this basename: GLUCAP,  in the last 168 hours  Radiological Exams on Admission: Dg Abd Acute W/chest  11/29/2012  *RADIOLOGY REPORT*  Clinical Data: Abdominal pain with nausea vomiting.  ACUTE ABDOMEN SERIES (ABDOMEN 2 VIEW & CHEST 1 VIEW)  Comparison: 11/24/2012 and prior radiographs  Findings: Upper limits normal heart size and low lung volumes again noted. Left lower lung atelectasis/consolidation again identified. There is no evidence of pneumothorax. An NG tube is present with tip overlying the proximal stomach.  Gas and stool is noted within the colon and rectum. Nondistended gas-filled loops of small bowel are present. There is no evidence of bowel obstruction or pneumoperitoneum. No acute bony abnormalities are present. A severe thoracolumbar scoliosis is present.  IMPRESSION: Nonspecific nonobstructive bowel gas pattern - no definite evidence of bowel obstruction or pneumoperitoneum.  NG tube with tip overlying the proximal stomach.  Continued left lower lung atelectasis/consolidation.   Original Report Authenticated By: Harmon Pier, M.D.       Assessment/Plan Active Problems:   Dementia   Stroke   Hypertension   Hemiparesis affecting right side as late effect of stroke   Expressive aphasia   Pericardial effusion    Hematemesis   1. Hematemesis: 2 EGDs in the last 30 days, EGD in February with large hiatal hernia and mild gastritis and last EGD 3/12 with ulcerative esophagitis, was discharged on Protonix BID and Carafate - hemoglobin unchanged from prior -Type and cross, CBC Q12 - admit to Tele, IV protonix 40mg  Q12 - clear liquid diet - [facility] consulted, d/w Dr.Outlaw for eval in am - Last CT 3/11 rather unremarkable,  Last gastric emptying scan 1/09: mildly delayed emptying - ASA and Plavix continue to be on hold  2. Anemia: acute on chronic blood loss: stable from prior -type and cross, monitor, transfuse of <7 or active bleed  3. Hypertension with sinus tachycardia  -Continue with toprol, decrease hydralazine dose, hold ACE and amlodipine  4. Pericardial effusion s/p pericardial window on 2/28  -Stable. Incision site looks fine, Fluid analysis was benign  5. Dementia:  -Continue namenda 10 mg   6. 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

## 2012-11-29 NOTE — ED Notes (Signed)
Pt states he was unable to drink any of the water offered. Still feeling slightly nauseated

## 2012-11-29 NOTE — Progress Notes (Signed)
11-29-12 nsg:  Pt has not voided since 1500.  Bladder scan reveals greater than 1000cc's.  Pt vss and does not exhibit s/s of retention.  [facility] on call and notified - awaiting response.

## 2012-11-29 NOTE — ED Provider Notes (Signed)
History     CSN: [id number]  Arrival date & time 11/29/12  0805   First MD Initiated Contact with Patient 11/29/12 0809      Chief Complaint  Patient presents with  . Emesis    (Consider location/radiation/quality/duration/timing/severity/associated sxs/prior treatment) HPI Comments: Patient from nursing home with one week of reported nausea and vomiting. Concern that he had coffee-ground emesis today but EMS states the vomit did not appear to be dark. Recent hospitalization with endoscopy that showed ulcerative esophagitis. Patient discharged 2 days ago for the same complaint. He had a gastric emptying study, CT scan of his abdomen as well as endoscopy performed. Is tolerating by mouth liquids at the time of discharge. Patient has a history of dementia and prior CVA and unable to provide much of a history. He denies any chest pain or shortness of breath he states his abdomen hurts. He is not sure why he is here.  The history is provided by the nursing home and the EMS personnel. The history is limited by the condition of the patient.    Past Medical History  Diagnosis Date  . Dementia   . Stroke   . Hydronephrosis   . Hypertension   . Hyperlipemia   . GERD (gastroesophageal reflux disease)   . BPH (benign prostatic hyperplasia)     Past Surgical History  Procedure Laterality Date  . Esophagogastroduodenoscopy N/A 11/11/2012    Procedure: ESOPHAGOGASTRODUODENOSCOPY (EGD);  Surgeon: [name], MD;  Location: [facility] ENDOSCOPY;  Service: Endoscopy;  Laterality: N/A;  . Subxyphoid pericardial window N/A 11/13/2012    Procedure: SUBXYPHOID PERICARDIAL WINDOW;  Surgeon: Delight Ovens, MD;  Location: [facility] OR;  Service: Thoracic;  Laterality: N/A;  . Esophagogastroduodenoscopy N/A 11/25/2012    Procedure: ESOPHAGOGASTRODUODENOSCOPY (EGD);  Surgeon: Vertell Novak., MD;  Location: [facility] ENDOSCOPY;  Service: Endoscopy;  Laterality: N/A;    History reviewed. No pertinent family  history.  History  Substance Use Topics  . Smoking status: Never Smoker   . Smokeless tobacco: Never Used  . Alcohol Use: No      Review of Systems  Unable to perform ROS: Dementia  Gastrointestinal: Positive for vomiting.    Allergies  Review of patient's allergies indicates no known allergies.  Home Medications   Current Outpatient Rx  Name  Route  Sig  Dispense  Refill  . amLODipine (NORVASC) 10 MG tablet   Oral   Take 10 mg by mouth daily.         . citalopram (CELEXA) 10 MG tablet   Oral   Take 5 mg by mouth daily.         Marland Kitchen docusate sodium (COLACE) 100 MG capsule   Oral   Take 100 mg by mouth 2 (two) times daily.         . hydrALAZINE (APRESOLINE) 50 MG tablet   Oral   Take 50 mg by mouth 3 (three) times daily.         Marland Kitchen lactulose (CHRONULAC) 10 GM/15ML solution   Oral   Take 30 mLs by mouth 2 (two) times daily.         Marland Kitchen lisinopril (PRINIVIL,ZESTRIL) 10 MG tablet   Oral   Take 1 tablet (10 mg total) by mouth daily.         . memantine (NAMENDA) 10 MG tablet   Oral   Take 10 mg by mouth 2 (two) times daily.         . metoCLOPramide (REGLAN)  5 MG tablet   Oral   Take 1 tablet (5 mg total) by mouth 4 (four) times daily -  before meals and at bedtime.         . metoprolol (LOPRESSOR) 50 MG tablet   Oral   Take 50 mg by mouth every evening.         . metoprolol succinate (TOPROL-XL) 200 MG 24 hr tablet   Oral   Take 1 tablet (200 mg total) by mouth daily. Take with or immediately following a meal.   30 tablet   0   . omeprazole (PRILOSEC) 20 MG capsule   Oral   Take 1 capsule (20 mg total) by mouth 2 (two) times daily.         . simvastatin (ZOCOR) 40 MG tablet   Oral   Take 40 mg by mouth every evening.         . sucralfate (CARAFATE) 1 GM/10ML suspension   Oral   Take 10 mLs (1 g total) by mouth 4 (four) times daily -  with meals and at bedtime. For 2 weeks   420 mL      . tamsulosin (FLOMAX) 0.4 MG CAPS    Oral   Take 0.4 mg by mouth daily.           BP 129/68  Pulse 119  Temp(Src) 101 F (38.3 C) (Rectal)  Resp 23  SpO2 97%  Physical Exam  Constitutional: He appears well-developed and well-nourished. No distress.  HENT:  Head: Normocephalic and atraumatic.  Mouth/Throat: Oropharynx is clear and moist. No oropharyngeal exudate.  Eyes: Conjunctivae and EOM are normal. Pupils are equal, round, and reactive to light.  Neck: Normal range of motion. Neck supple.  Cardiovascular: Normal rate, regular rhythm and normal heart sounds.   Mild tachycardia  Pulmonary/Chest: Effort normal and breath sounds normal. No respiratory distress.  Abdominal: There is tenderness. There is no rebound and no guarding.  Musculoskeletal: Normal range of motion.  Neurological: He is alert.  Chronic right-sided hemiparesis  Skin: Skin is warm.    ED Course  Procedures (including critical care time)  Labs Reviewed  CBC WITH DIFFERENTIAL - Abnormal; Notable for the following:    WBC 11.2 (*)    RBC 3.85 (*)    Hemoglobin 9.8 (*)    HCT 30.8 (*)    MCH 25.5 (*)    Platelets 527 (*)    Neutrophils Relative 88 (*)    Neutro Abs 9.9 (*)    Lymphocytes Relative 6 (*)    All other components within normal limits  COMPREHENSIVE METABOLIC PANEL - Abnormal; Notable for the following:    CO2 18 (*)    Glucose, Bld 162 (*)    Creatinine, Ser 1.71 (*)    Albumin 3.1 (*)    Total Bilirubin 0.2 (*)    GFR calc non Af Amer 40 (*)    GFR calc Af Amer 46 (*)    All other components within normal limits  LACTIC ACID, PLASMA - Abnormal; Notable for the following:    Lactic Acid, Venous 2.8 (*)    All other components within normal limits  OCCULT BLOOD GASTRIC / DUODENUM (SPECIMEN CUP) - Abnormal; Notable for the following:    Occult Blood, Gastric POSITIVE (*)    All other components within normal limits  LACTIC ACID, PLASMA - Abnormal; Notable for the following:    Lactic Acid, Venous 2.3 (*)    All  other components within normal  limits  LIPASE, BLOOD  TROPONIN I  PROTIME-INR  TYPE AND SCREEN  ABO/RH   Dg Abd Acute W/chest  11/29/2012  *RADIOLOGY REPORT*  Clinical Data: Abdominal pain with nausea vomiting.  ACUTE ABDOMEN SERIES (ABDOMEN 2 VIEW & CHEST 1 VIEW)  Comparison: 11/24/2012 and prior radiographs  Findings: Upper limits normal heart size and low lung volumes again noted. Left lower lung atelectasis/consolidation again identified. There is no evidence of pneumothorax. An NG tube is present with tip overlying the proximal stomach.  Gas and stool is noted within the colon and rectum. Nondistended gas-filled loops of small bowel are present. There is no evidence of bowel obstruction or pneumoperitoneum. No acute bony abnormalities are present. A severe thoracolumbar scoliosis is present.  IMPRESSION: Nonspecific nonobstructive bowel gas pattern - no definite evidence of bowel obstruction or pneumoperitoneum.  NG tube with tip overlying the proximal stomach.  Continued left lower lung atelectasis/consolidation.   Original Report Authenticated By: Harmon Pier, M.D.      No diagnosis found.    MDM  Patient with dementia, CVA, aphasia, recent admit for GI bleed as well as pericardial effusion with window and postoperative ileus presenting from nursing home with nausea and vomiting that was questioned to be coffee-ground. Patient has had 2 EGDs in the past month which showed gastritis and esophagitis. He has no complaints and does not know why he is here.  Hemoglobin 9.8 which is stable from previous records during hospitalization though nursing home record show patient had a hemoglobin of 11.4 on 3/10.  NG tube placed with minimal dark output.  Vomiting controlled in the ED. Patient however remained tachycardic in the 120s despite fluid resuscitation. He endorses feeling weak and dizzy. Febrile to 101 in the ED.  No evidence of PNA or UTI. Given ongoing tachycardia, ongoing vomiting, and  fever, will admit.   Date: 11/29/2012  Rate: 107  Rhythm: sinus tachycardia  QRS Axis: normal  Intervals: normal  ST/T Wave abnormalities: nonspecific ST/T changes  Conduction Disutrbances:none  Narrative Interpretation: lateral T wave inversions  Old EKG Reviewed: unchanged   Date: 11/29/2012  Rate: 115  Rhythm: sinus tachycardia  QRS Axis: normal  Intervals: normal  ST/T Wave abnormalities: nonspecific ST/T changes  Conduction Disutrbances:left bundle branch block  Narrative Interpretation: lateral T wave inversions  Old EKG Reviewed: unchanged    Glynn Octave, MD 11/29/12 1536

## 2012-11-29 NOTE — Progress Notes (Signed)
Patient transfered from ED to 1435, alert with some slurred speech/expressive aphasia. Follows command unable to get enough information from patient to complete admission; patient from SNF [facility]). Skin intact but old surgical incision over mid abd, one suture noted, no sign of infection noted, open to air, clean dry, and intact. Right arm hemiparesis from old CVA.  Reviewed plan of care with patient, oriented patient to room/unit. Will continue to F/U with plan of care.

## 2012-11-29 NOTE — ED Notes (Signed)
4w called for report. RN at lunch. Will call back

## 2012-11-29 NOTE — ED Notes (Signed)
Pt actively vomiting coffee grown emesis. MD aware

## 2012-11-30 LAB — COMPREHENSIVE METABOLIC PANEL
Albumin: 2.3 g/dL — ABNORMAL LOW (ref 3.5–5.2)
Alkaline Phosphatase: 37 U/L — ABNORMAL LOW (ref 39–117)
BUN: 20 mg/dL (ref 6–23)
Calcium: 8.1 mg/dL — ABNORMAL LOW (ref 8.4–10.5)
Creatinine, Ser: 1.68 mg/dL — ABNORMAL HIGH (ref 0.50–1.35)
GFR calc Af Amer: 47 mL/min — ABNORMAL LOW (ref >90)
Glucose, Bld: 108 mg/dL — ABNORMAL HIGH (ref 70–99)
Total Protein: 5.1 g/dL — ABNORMAL LOW (ref 6.0–8.3)

## 2012-11-30 LAB — CBC
HCT: 24.6 % — ABNORMAL LOW (ref 39.0–52.0)
Hemoglobin: 7.8 g/dL — ABNORMAL LOW (ref 13.0–17.0)
MCH: 25.2 pg — ABNORMAL LOW (ref 26.0–34.0)
MCHC: 31.7 g/dL (ref 30.0–36.0)
MCV: 79.4 fL (ref 78.0–100.0)
MCV: 80.3 fL (ref 78.0–100.0)
Platelets: 361 10*3/uL (ref 150–400)
RBC: 3.05 MIL/uL — ABNORMAL LOW (ref 4.22–5.81)
RDW: 14.5 % (ref 11.5–15.5)
RDW: 14.3 % (ref 11.5–15.5)
WBC: 6.9 10*3/uL (ref 4.0–10.5)

## 2012-11-30 MED ORDER — METOPROLOL SUCCINATE ER 100 MG PO TB24
200.0000 mg | ORAL_TABLET | Freq: Every day | ORAL | Status: DC
  Administered 2012-12-01 – 2012-12-04 (×4): 200 mg via ORAL
  Filled 2012-11-30 (×5): qty 2

## 2012-11-30 MED ORDER — VANCOMYCIN HCL 10 G IV SOLR
1250.0000 mg | INTRAVENOUS | Status: DC
  Administered 2012-12-01: 1250 mg via INTRAVENOUS
  Filled 2012-11-30 (×2): qty 1250

## 2012-11-30 NOTE — Consult Note (Signed)
Referring Provider:  Dr. Mitchel Honour Primary Care Physician:  No primary provider on file. Primary Gastroenterologist: None  Reason for consultation:  Coffee ground emesis  HPI: [name] is a 67 y.o. male  admitted from the skilled nursing facility 2 days ago with a reported history of coffee ground emesis. He has had no further hematemesis since admission, and just 1 episode of vomiting, yesterday, that was nonbloody in character. BUN is normal, hemoglobin is stable since admission although it is about 1 g lower than it was at the time of hospital discharge several days ago.  The patient underwent endoscopy by Dr. Charlott Rakes on February 26 for reported hematemesis, at which time a small hiatal hernia was noted, without other abnormalities and specifically no source of hematemesis identified.  On March 12, the patient then underwent endoscopic evaluation, again because of reported hematemesis, this time by Dr. Carman Ching, at which time there was a large hiatal hernia noted, and focal erosive esophagitis at, and just above, the GE junction. Since then, the patient has been on omeprazole 20 mg twice a day plus sucralfate suspension 1 g 4 times a day.    Past Medical History  Diagnosis Date  . Dementia   . Stroke   . Hydronephrosis   . Hypertension   . Hyperlipemia   . GERD (gastroesophageal reflux disease)   . BPH (benign prostatic hyperplasia)     Past Surgical History  Procedure Laterality Date  . Esophagogastroduodenoscopy N/A 11/11/2012    Procedure: ESOPHAGOGASTRODUODENOSCOPY (EGD);  Surgeon: [name], MD;  Location:  [facility] ENDOSCOPY;  Service: Endoscopy;  Laterality: N/A;  . Subxyphoid pericardial window N/A 11/13/2012    Procedure: SUBXYPHOID PERICARDIAL WINDOW;  Surgeon: Delight Ovens, MD;  Location: [facility] OR;  Service: Thoracic;  Laterality: N/A;  . Esophagogastroduodenoscopy N/A 11/25/2012    Procedure: ESOPHAGOGASTRODUODENOSCOPY (EGD);  Surgeon: Vertell Novak., MD;  Location: [facility] ENDOSCOPY;  Service: Endoscopy;  Laterality: N/A;    Prior to Admission medications   Medication Sig Start Date End Date Taking? Authorizing Provider  amLODipine (NORVASC) 10 MG tablet Take 10 mg by mouth daily.   Yes Historical Provider, MD  citalopram (CELEXA) 10 MG tablet Take 5 mg by mouth daily.   Yes Historical Provider, MD  docusate sodium (COLACE) 100 MG capsule Take 100 mg by mouth 2 (two) times daily.   Yes Historical Provider, MD  hydrALAZINE (APRESOLINE) 50 MG tablet Take 50 mg by mouth 3 (three) times daily.   Yes Historical Provider, MD  lactulose (CHRONULAC) 10 GM/15ML solution Take 30 mLs by mouth 2 (two) times daily.   Yes Historical Provider, MD  lisinopril (PRINIVIL,ZESTRIL) 10 MG tablet Take 1 tablet (10 mg total) by mouth daily. 11/20/12  Yes Rhetta Mura, MD  memantine (NAMENDA) 10 MG tablet Take 10 mg by mouth 2 (two) times daily.   Yes Historical Provider, MD  metoCLOPramide (REGLAN) 5 MG tablet Take 1 tablet (5 mg total) by mouth 4 (four) times daily -  before meals and at bedtime. 11/27/12  Yes Osvaldo Shipper, MD  metoprolol (LOPRESSOR) 50 MG tablet Take 50 mg by mouth every evening.   Yes Historical Provider, MD  metoprolol succinate (TOPROL-XL) 200 MG 24 hr tablet Take 1 tablet (200 mg total) by mouth daily. Take with or immediately following a meal. 11/20/12  Yes Rhetta Mura, MD  omeprazole (PRILOSEC) 20 MG capsule Take 1 capsule (20 mg total) by mouth 2 (two) times daily. 11/27/12  Yes  Osvaldo Shipper, MD  simvastatin (ZOCOR) 40 MG tablet Take 40 mg by mouth every evening.   Yes Historical Provider, MD  sucralfate (CARAFATE) 1 GM/10ML suspension Take 10 mLs (1 g total) by mouth 4 (four) times daily -  with meals and at bedtime. For 2 weeks 11/27/12  Yes Osvaldo Shipper, MD  tamsulosin (FLOMAX) 0.4 MG CAPS Take 0.4 mg by mouth daily.   Yes Historical Provider, MD    Current Facility-Administered Medications  Medication Dose Route  Frequency Provider Last Rate Last Dose  . 0.9 %  sodium chloride infusion   Intravenous Continuous Zannie Cove, MD 50 mL/hr at 11/30/12 1041    . acetaminophen (TYLENOL) tablet 650 mg  650 mg Oral Q6H PRN Zannie Cove, MD       Or  . acetaminophen (TYLENOL) suppository 650 mg  650 mg Rectal Q6H PRN Zannie Cove, MD      . ceFEPIme (MAXIPIME) 2 g in dextrose 5 % 50 mL IVPB  2 g Intravenous Q24H Loletta Specter, RPH   2 g at 11/30/12 0000  . hydrALAZINE (APRESOLINE) tablet 25 mg  25 mg Oral Q8H Zannie Cove, MD   25 mg at 11/30/12 2132  . lactulose (CHRONULAC) 10 GM/15ML solution 20 g  20 g Oral BID Zannie Cove, MD   20 g at 11/30/12 2131  . levofloxacin (LEVAQUIN) IVPB 750 mg  750 mg Intravenous Q48H Zannie Cove, MD   750 mg at 11/29/12 2200  . memantine (NAMENDA) tablet 10 mg  10 mg Oral BID Zannie Cove, MD   10 mg at 11/30/12 2132  . metoprolol succinate (TOPROL-XL) 24 hr tablet 200 mg  200 mg Oral Daily Zannie Cove, MD      . ondansetron [facility]) injection 4 mg  4 mg Intravenous Q6H PRN Zannie Cove, MD      . pantoprazole (PROTONIX) injection 40 mg  40 mg Intravenous Q12H Zannie Cove, MD   40 mg at 11/30/12 2132  . simvastatin (ZOCOR) tablet 40 mg  40 mg Oral q1800 Zannie Cove, MD   40 mg at 11/30/12 1743  . sodium chloride 0.9 % injection 3 mL  3 mL Intravenous Q12H Zannie Cove, MD      . sucralfate (CARAFATE) 1 GM/10ML suspension 1 g  1 g Oral TID WC & HS Zannie Cove, MD   1 g at 11/30/12 2131  . [START ON 12/01/2012] vancomycin (VANCOCIN) 1,250 mg in sodium chloride 0.9 % 250 mL IVPB  1,250 mg Intravenous Q24H Dannielle Huh, RPH        Allergies as of 11/29/2012  . (No Known Allergies)    History reviewed. No pertinent family history.  History   Social History  . Marital Status: Single    Spouse Name: N/A    Number of Children: N/A  . Years of Education: N/A   Occupational History  . Not on file.   Social History Main Topics   . Smoking status: Never Smoker   . Smokeless tobacco: Never Used  . Alcohol Use: No  . Drug Use: No  . Sexually Active: Not on file   Other Topics Concern  . Not on file   Social History Narrative  . No narrative on file    Review of Systems: not obtainable from this aphasic patient  Physical Exam: Vital signs in last 24 hours: Temp:  [97.4 F (36.3 C)-98.5 F (36.9 C)] 97.7 F (36.5 C) (03/17 2110) Pulse Rate:  [96-105] 96 (03/17 2110)  Resp:  [18-20] 18 (03/17 2110) BP: (122-133)/(53-70) 130/70 mmHg (03/17 2110) SpO2:  [99 %-100 %] 100 % (03/17 2110) Last BM Date:  (Prior to admission) General:   well-nourished African American male sitting up in bed, being spoonfed sucralfate suspension by the nurse. He is alert, but severely dysphasic. Head:  Normocephalic and atraumatic. Eyes:  Sclera clear, no icterus.  Lungs:  No evident respiratory distress. Unable to cooperate with auscultation.  Heart:  No gallops, rubs, or murmurs appreciated. Rhythm regular.:   Abdomen:  Soft and nontender. Extremities:   Right arm contractured. Neurologic: See above  Intake/Output from previous day: 03/16 0701 - 03/17 0700 In: 1537.5 [P.O.:660; I.V.:527.5; IV Piggyback:350] Out: 1740 [Urine:1725] Intake/Output this shift: Total I/O In: -  Out: 700 [Urine:700]  Lab Results:  Recent Labs  11/29/12 2337 11/30/12 0434 11/30/12 1949  WBC 7.5 6.0 6.9  HGB 7.7* 7.8* 7.7*  HCT 24.5* 24.6* 24.5*  PLT 405* 358 361   BMET  Recent Labs  11/29/12 1000 11/30/12 0434  NA 136 140  K 3.6 3.6  CL 101 108  CO2 18* 23  GLUCOSE 162* 108*  BUN 22 20  CREATININE 1.71* 1.68*  CALCIUM 8.7 8.1*   LFT  Recent Labs  11/30/12 0434  PROT 5.1*  ALBUMIN 2.3*  AST 9  ALT 5  ALKPHOS 37*  BILITOT 0.2*   PT/INR  Recent Labs  11/29/12 1000  LABPROT 14.2  INR 1.11     Studies/Results: Dg Chest 1 View  11/29/2012  *RADIOLOGY REPORT*  Clinical Data: Cough and congestion.  CHEST - 1  VIEW  Comparison: 11/29/2012 AP view of the chest  Findings: A lateral view of the chest demonstrates airspace opacity within the lower lobe. No definite pleural effusion is noted. No acute bony abnormalities are noted.  IMPRESSION: Lower lobe airspace opacity compatible with pneumonia. Radiographic follow up to resolution is recommended.   Original Report Authenticated By: Harmon Pier, M.D.    Dg Abd Acute W/chest  11/29/2012  *RADIOLOGY REPORT*  Clinical Data: Abdominal pain with nausea vomiting.  ACUTE ABDOMEN SERIES (ABDOMEN 2 VIEW & CHEST 1 VIEW)  Comparison: 11/24/2012 and prior radiographs  Findings: Upper limits normal heart size and low lung volumes again noted. Left lower lung atelectasis/consolidation again identified. There is no evidence of pneumothorax. An NG tube is present with tip overlying the proximal stomach.  Gas and stool is noted within the colon and rectum. Nondistended gas-filled loops of small bowel are present. There is no evidence of bowel obstruction or pneumoperitoneum. No acute bony abnormalities are present. A severe thoracolumbar scoliosis is present.  IMPRESSION: Nonspecific nonobstructive bowel gas pattern - no definite evidence of bowel obstruction or pneumoperitoneum.  NG tube with tip overlying the proximal stomach.  Continued left lower lung atelectasis/consolidation.   Original Report Authenticated By: Harmon Pier, M.D.     Impression: Reported coffee ground emesis without overt clinical instability in a patient with recent documented distal esophagitis and large hiatal hernia   Plan: Okay to stop the sucralfate suspension. Continue PPI therapy. Would consider repeat endoscopy in the event of acute, persistent, or destabilizing hemorrhage.  Please call us if we can be of further assistance with this patient.    LOS: 1 day   Minka Knight V  11/30/2012, 10:03 PM

## 2012-11-30 NOTE — Progress Notes (Signed)
   CARE MANAGEMENT NOTE 11/30/2012  Patient:  [name], [name]   Account Number:  [id number]  Date Initiated:  11/30/2012  Documentation initiated by:  Jiles Crocker  Subjective/Objective Assessment:   ADMITTED WITH HEMATEMESIS, PNEUMONIA     Action/Plan:   PATIENT RESIDES IN A NURSING FACILITY; SOC WORKER REFERRAL PLACED   Anticipated DC Date:  12/04/2012   Anticipated DC Plan:  SKILLED NURSING FACILITY  In-house referral  Clinical Social Worker      DC Planning Services  CM consult          Status of service:  In process, will continue to follow Medicare Important Message given?  NA - LOS <3 / Initial given by admissions (If response is "NO", the following Medicare IM given date fields will be blank) Per UR Regulation:  Reviewed for med. necessity/level of care/duration of stay Comments:  11/30/2012- B Kevonta Phariss RN,BSN,MHA

## 2012-11-30 NOTE — Progress Notes (Signed)
SPEECH PATHOLOGY  Patient is known to this therapist from previous admission to [facility].  Primary issues are GI related (N/V) and not oropharyngeal dysphagia.  Pt. Was seen by SLP for Bedside Swallow Evaluation on 11/12/12 and placed on a Regular Diet with thin liquids.  Oral-motor deficits and aphasia are patient's baseline from previous stroke.  The following is copied from the previous Swallow evaluation completed at [facility] on 11/12/12.    Clinical Impression  Patient presents with a mild oral phase dysphagia characterized by right buccal pocketing and occassional right anterior loss, however patient does not exhibit overt s/s of aspiration and oral mechanisms functional for independent clearance of oral cavity. SLP provided education for use of compensatory strategies including checking for anterior loss of bolus and utilizing finger and/or linigual sweep and liquid wash to facilitate clearance of bolus from right buccal cavity. Suspect that swallowing function is currently at baseline from previous CVA. 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 SLP recommends patient upgrade to regular diet with thin liquids with use of compensatory strategies to check for pocketing and follow bites with liquid wash. SLP communicated recommendations with patient and RN. No f/u needed at this time, however please contact SLP if needed.  Aspiration Risk  Mild  Diet Recommendation Regular;Thin liquid  Consider reconsulting GI. No f/u ST recommended at this time.

## 2012-11-30 NOTE — Progress Notes (Signed)
[facility] PROGRESS NOTE  [name] ZOX:[id number] DOB: [birth year] DOA: 11/29/2012 PCP: No primary provider on file.  Assessment/Plan: 1. Hematemesis:  - 2 EGDs in the last 3 weeks, EGD 2/26 with large hiatal hernia and mild gastritis and last EGD 3/12 with ulcerative esophagitis, Pathology:  benign focally inflammed mucosa, was discharged on Protonix BID and Carafate  - suspect infection/pneumonia could have been contributing to vomiting yesterday - hemoglobin dropped from 9.8 to 7.7  -Type and cross, CBC Q12  - continue  IV protonix 40mg  Q12 and carafate - tolerating clear liquid diet, swallow eval prior to advancing diet - [facility] consulted, d/w Dr.Outlaw - Last CT 3/11 rather unremarkable, Last gastric emptying scan 1/09: mildly delayed emptying  - ASA and Plavix continue to be on hold   2. Anemia: acute blood loss anemia, see above -: transfuse if <7   3. Health care associated pneumonia vs Aspiration pneumonia -continue IV Vanc/Cefepime/Levaquin -FU blood Cx x2 -swallow eval  4. Hypertension with sinus tachycardia  -Continue toprol increase to home dose of 200mg , continue decreased hydralazine dose, hold ACE and amlodipine   5. Pericardial effusion: s/p pericardial window on 2/28  -Stable. Incision site looks fine, Fluid analysis was benign   6. Dementia:  -Continue namenda 10 mg   7. 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  baseline seems to be 1.5-1.3, creatinine close to baseline, hold ACE  Code Status: FULL  Family Communication:none at bedside  Disposition Plan: back to SNF when improved   Consultants:  [facility]  Antibiotics:  Vancomycin 3/16  Cefepime 3/16  Levaquin 3/16   HPI/Subjective: Doing better, no further vomiting, tolerating clears, cough improved  Objective: Filed Vitals:   11/29/12 1757 11/29/12 2156 11/30/12 0515 11/30/12 0848  BP:  133/58 127/70 122/60 124/53  Pulse: 118 108 105 103  Temp: 98.6 F (37 C) 99.2 F (37.3 C) 98.5 F (36.9 C)   TempSrc:  Axillary Oral   Resp: 16 20 20    Height:      Weight:      SpO2: 98% 98% 99%     Intake/Output Summary (Last 24 hours) at 11/30/12 0939 Last data filed at 11/30/12 0825  Gross per 24 hour  Intake 1702.5 ml  Output   1740 ml  Net  -37.5 ml   Filed Weights   11/29/12 1530  Weight: 86.1 kg (189 lb 13.1 oz)    Exam:   General:  Alert, awake, oriented to self and place only, aphasic  Cardiovascular: S1S2/RRR  Respiratory: basilar ronchi  Abdomen: soft, mildly distended, non tender, BS present  Neuro: long standing R hemiparesis, RUE is spastic  Data Reviewed: Basic Metabolic Panel:  Recent Labs Lab 11/25/12 1026 11/26/12 0450 11/26/12 1757 11/27/12 0620 11/29/12 1000 11/30/12 0434  NA 146* 142  --  141 136 140  K 4.5 4.2  --  3.9 3.6 3.6  CL 111 110  --  108 101 108  CO2 26 23  --  22 18* 23  GLUCOSE 125* 102*  --  97 162* 108*  BUN 23 19  --  15 22 20   CREATININE 1.62* 1.56*  --  1.54* 1.71* 1.68*  CALCIUM 9.0 8.8  --  8.4 8.7 8.1*  MG  --   --  1.8  --   --   --    Liver Function Tests:  Recent Labs Lab  11/24/12 0004 11/29/12 1000 11/30/12 0434  AST 18 12 9   ALT 10 9 5   ALKPHOS 48 52 37*  BILITOT 0.1* 0.2* 0.2*  PROT 6.8 6.5 5.1*  ALBUMIN 3.1* 3.1* 2.3*    Recent Labs Lab 11/24/12 0004 11/29/12 1000  LIPASE 35 34   No results found for this basename: AMMONIA,  in the last 168 hours CBC:  Recent Labs Lab 11/27/12 0620 11/29/12 1000 11/29/12 1550 11/29/12 2337 11/30/12 0434  WBC 5.3 11.2* 10.6* 7.5 6.0  NEUTROABS  --  9.9*  --   --   --   HGB 9.0* 9.8* 9.2* 7.7* 7.8*  HCT 27.9* 30.8* 29.3* 24.5* 24.6*  MCV 79.0 80.0 80.5 79.8 79.4  PLT 368 527* 373 405* 358   Cardiac Enzymes:  Recent Labs Lab 11/24/12 0004 11/29/12 1000  TROPONINI <0.30 <0.30   BNP (last 3 results)  Recent Labs  11/12/12 2000   PROBNP 818.8*   CBG: No results found for this basename: GLUCAP,  in the last 168 hours  Recent Results (from the past 240 hour(s))  CULTURE, BLOOD (ROUTINE X 2)     Status: None   Collection Time    11/29/12  4:15 PM      Result Value Range Status   Specimen Description BLOOD LEFT ARM   Final   Special Requests BOTTLES DRAWN AEROBIC AND ANAEROBIC 1CC EACH   Final   Culture  Setup Time 11/29/2012 20:32   Final   Culture     Final   Value:        BLOOD CULTURE RECEIVED NO GROWTH TO DATE CULTURE WILL BE HELD FOR 5 DAYS BEFORE ISSUING A FINAL NEGATIVE REPORT   Report Status PENDING   Incomplete  CULTURE, BLOOD (ROUTINE X 2)     Status: None   Collection Time    11/29/12  4:24 PM      Result Value Range Status   Specimen Description BLOOD LEFT ARM   Final   Special Requests BOTTLES DRAWN AEROBIC AND ANAEROBIC 1CC EACH   Final   Culture  Setup Time 11/29/2012 20:32   Final   Culture     Final   Value:        BLOOD CULTURE RECEIVED NO GROWTH TO DATE CULTURE WILL BE HELD FOR 5 DAYS BEFORE ISSUING A FINAL NEGATIVE REPORT   Report Status PENDING   Incomplete  MRSA PCR SCREENING     Status: None   Collection Time    11/29/12  4:37 PM      Result Value Range Status   MRSA by PCR NEGATIVE  NEGATIVE Final   Comment:            The GeneXpert MRSA Assay (FDA     approved for NASAL specimens     only), is one component of a     comprehensive MRSA colonization     surveillance program. It is not     intended to diagnose MRSA     infection nor to guide or     monitor treatment for     MRSA infections.     Studies: Dg Chest 1 View  11/29/2012  *RADIOLOGY REPORT*  Clinical Data: Cough and congestion.  CHEST - 1 VIEW  Comparison: 11/29/2012 AP view of the chest  Findings: A lateral view of the chest demonstrates airspace opacity within the lower lobe. No definite pleural effusion is noted. No acute bony abnormalities are noted.  IMPRESSION: Lower lobe airspace opacity  compatible with  pneumonia. Radiographic follow up to resolution is recommended.   Original Report Authenticated By: Harmon Pier, M.D.    Dg Abd Acute W/chest  11/29/2012  *RADIOLOGY REPORT*  Clinical Data: Abdominal pain with nausea vomiting.  ACUTE ABDOMEN SERIES (ABDOMEN 2 VIEW & CHEST 1 VIEW)  Comparison: 11/24/2012 and prior radiographs  Findings: Upper limits normal heart size and low lung volumes again noted. Left lower lung atelectasis/consolidation again identified. There is no evidence of pneumothorax. An NG tube is present with tip overlying the proximal stomach.  Gas and stool is noted within the colon and rectum. Nondistended gas-filled loops of small bowel are present. There is no evidence of bowel obstruction or pneumoperitoneum. No acute bony abnormalities are present. A severe thoracolumbar scoliosis is present.  IMPRESSION: Nonspecific nonobstructive bowel gas pattern - no definite evidence of bowel obstruction or pneumoperitoneum.  NG tube with tip overlying the proximal stomach.  Continued left lower lung atelectasis/consolidation.   Original Report Authenticated By: Harmon Pier, M.D.     Scheduled Meds: . ceFEPime (MAXIPIME) IV  2 g Intravenous Q24H  . citalopram  5 mg Oral Daily  . hydrALAZINE  25 mg Oral Q8H  . lactulose  20 g Oral BID  . levofloxacin (LEVAQUIN) IV  750 mg Intravenous Q48H  . memantine  10 mg Oral BID  . metoprolol succinate  100 mg Oral Q breakfast  . pantoprazole (PROTONIX) IV  40 mg Intravenous Q12H  . simvastatin  40 mg Oral q1800  . sodium chloride  3 mL Intravenous Q12H  . sucralfate  1 g Oral TID WC & HS  . vancomycin  750 mg Intravenous Q12H   Continuous Infusions: . sodium chloride 75 mL/hr at 11/29/12 1758    Active Problems:   Dementia   Stroke   Hypertension   Hemiparesis affecting right side as late effect of stroke   Expressive aphasia   Pericardial effusion   Hematemesis    Time spent:    [facility]  [facility] Pager  [phone]. If 7PM-7AM, please contact night-coverage at [email or web address], password [facility] 11/30/2012, 9:39 AM  LOS: 1 day

## 2012-11-30 NOTE — Progress Notes (Signed)
ANTIBIOTIC CONSULT NOTE - Follow-up  Pharmacy Consult for Vancomycin, Levaquin, and Cefepime Indication: pneumonia  No Known Allergies  Patient Measurements: Height: 5\' 10"  (177.8 cm) Weight: 189 lb 13.1 oz (86.1 kg) IBW/kg (Calculated) : 73 Adjusted Body Weight:   Vital Signs: Temp: 98.5 F (36.9 C) (03/17 0515) Temp src: Oral (03/17 0515) BP: 124/53 mmHg (03/17 0848) Pulse Rate: 103 (03/17 0848) Intake/Output from previous day: 03/16 0701 - 03/17 0700 In: 1462.5 [P.O.:660; I.V.:452.5; IV Piggyback:350] Out: 1740 [Urine:1725] Intake/Output from this shift: Total I/O In: 240 [P.O.:240] Out: -   Labs:  Recent Labs  11/29/12 1000 11/29/12 1550 11/29/12 2337 11/30/12 0434  WBC 11.2* 10.6* 7.5 6.0  HGB 9.8* 9.2* 7.7* 7.8*  PLT 527* 373 405* 358  CREATININE 1.71*  --   --  1.68*   Estimated Creatinine Clearance: 44.1 ml/min (by C-G formula based on Cr of 1.68). No results found for this basename: VANCOTROUGH, Leodis Binet, VANCORANDOM, GENTTROUGH, GENTPEAK, GENTRANDOM, TOBRATROUGH, TOBRAPEAK, TOBRARND, AMIKACINPEAK, AMIKACINTROU, AMIKACIN,  in the last 72 hours   Microbiology: Recent Results (from the past 720 hour(s))  MRSA PCR SCREENING     Status: None   Collection Time    11/11/12  1:24 PM      Result Value Range Status   MRSA by PCR NEGATIVE  NEGATIVE Final   Comment:            The GeneXpert MRSA Assay (FDA     approved for NASAL specimens     only), is one component of a     comprehensive MRSA colonization     surveillance program. It is not     intended to diagnose MRSA     infection nor to guide or     monitor treatment for     MRSA infections.  AFB CULTURE WITH SMEAR     Status: None   Collection Time    11/13/12 12:00 PM      Result Value Range Status   Specimen Description FLUID PERICARDIAL   Final   Special Requests NONE   Final   ACID FAST SMEAR NO ACID FAST BACILLI SEEN   Final   Culture     Final   Value: CULTURE WILL BE EXAMINED FOR 6  WEEKS BEFORE ISSUING A FINAL REPORT   Report Status PENDING   Incomplete  FUNGUS CULTURE W SMEAR     Status: None   Collection Time    11/13/12 12:00 PM      Result Value Range Status   Specimen Description FLUID PERICARDIAL   Final   Special Requests NONE   Final   Fungal Smear NO YEAST OR FUNGAL ELEMENTS SEEN   Final   Culture CULTURE IN PROGRESS FOR FOUR WEEKS   Final   Report Status PENDING   Incomplete  BODY FLUID CULTURE     Status: None   Collection Time    11/13/12 12:00 PM      Result Value Range Status   Specimen Description FLUID PERICARDIAL   Final   Special Requests NONE   Final   Gram Stain     Final   Value: NO WBC SEEN     NO ORGANISMS SEEN   Culture NO GROWTH 3 DAYS   Final   Report Status 11/16/2012 FINAL   Final  CLOSTRIDIUM DIFFICILE BY PCR     Status: None   Collection Time    11/17/12  1:20 PM      Result Value Range Status  C difficile by pcr NEGATIVE  NEGATIVE Final  CULTURE, BLOOD (ROUTINE X 2)     Status: None   Collection Time    11/29/12  4:15 PM      Result Value Range Status   Specimen Description BLOOD LEFT ARM   Final   Special Requests BOTTLES DRAWN AEROBIC AND ANAEROBIC 1CC EACH   Final   Culture  Setup Time 11/29/2012 20:32   Final   Culture     Final   Value:        BLOOD CULTURE RECEIVED NO GROWTH TO DATE CULTURE WILL BE HELD FOR 5 DAYS BEFORE ISSUING A FINAL NEGATIVE REPORT   Report Status PENDING   Incomplete  CULTURE, BLOOD (ROUTINE X 2)     Status: None   Collection Time    11/29/12  4:24 PM      Result Value Range Status   Specimen Description BLOOD LEFT ARM   Final   Special Requests BOTTLES DRAWN AEROBIC AND ANAEROBIC 1CC EACH   Final   Culture  Setup Time 11/29/2012 20:32   Final   Culture     Final   Value:        BLOOD CULTURE RECEIVED NO GROWTH TO DATE CULTURE WILL BE HELD FOR 5 DAYS BEFORE ISSUING A FINAL NEGATIVE REPORT   Report Status PENDING   Incomplete  MRSA PCR SCREENING     Status: None   Collection Time     11/29/12  4:37 PM      Result Value Range Status   MRSA by PCR NEGATIVE  NEGATIVE Final   Comment:            The GeneXpert MRSA Assay (FDA     approved for NASAL specimens     only), is one component of a     comprehensive MRSA colonization     surveillance program. It is not     intended to diagnose MRSA     infection nor to guide or     monitor treatment for     MRSA infections.    Medical History: Past Medical History  Diagnosis Date  . Dementia   . Stroke   . Hydronephrosis   . Hypertension   . Hyperlipemia   . GERD (gastroesophageal reflux disease)   . BPH (benign prostatic hyperplasia)     Medications:  Scheduled:  . [COMPLETED] sodium chloride   Intravenous Once  . [COMPLETED] acetaminophen (TYLENOL) oral liquid 160 mg/5 mL  650 mg Oral Once  . ceFEPime (MAXIPIME) IV  2 g Intravenous Q24H  . citalopram  5 mg Oral Daily  . hydrALAZINE  25 mg Oral Q8H  . lactulose  20 g Oral BID  . levofloxacin (LEVAQUIN) IV  750 mg Intravenous Q48H  . memantine  10 mg Oral BID  . metoprolol succinate  200 mg Oral Daily  . pantoprazole (PROTONIX) IV  40 mg Intravenous Q12H  . simvastatin  40 mg Oral q1800  . [COMPLETED] sodium chloride  1,000 mL Intravenous Once  . sodium chloride  3 mL Intravenous Q12H  . sucralfate  1 g Oral TID WC & HS  . vancomycin  750 mg Intravenous Q12H  . [DISCONTINUED] sodium chloride   Intravenous STAT  . [DISCONTINUED] metoprolol succinate  100 mg Oral Q breakfast   Assessment: HPI: 67 yo M with suspected pneumonia. LLL opacity c/w pneumonia.  3/16 >> Levaquin >> 3/16 >>Cefepime >>  3/16>>Vancomycin>>   Tmax:101 WBCs: 6 Renal: Scr =  1.68 CrCl, CG =43ml/min, N=43 ml/min  3/16 blood:NGTD 3/ urine: pending  Dose changes/drug level info:   Goal of Therapy:  Vancomycin trough level 15-20 mcg/ml  Plan:  D#1 vancomycin/cefepime/levofloxacin  Change Vancomycin to 1250mg  IV q24h based on CrCl   Levaquin 750mg  IV q 48 hours (based on  CrCl=92ml/min)   Cefepime 2 Gm IV q 24 hours Measure antibiotic drug levels at steady state Follow up culture results  Dannielle Huh 11/30/2012,12:08 PM

## 2012-11-30 NOTE — Progress Notes (Signed)
Clinical Social Work Department BRIEF PSYCHOSOCIAL ASSESSMENT 11/30/2012  Patient:  [name], [name]     Account Number:  [id number]     Admit date:  11/29/2012  Clinical Social Worker:  Dennison Bulla  Date/Time:  11/30/2012 03:45 PM  Referred by:  Physician  Date Referred:  11/30/2012 Referred for  SNF Placement   Other Referral:   Interview type:  Patient Other interview type:    PSYCHOSOCIAL DATA Living Status:  FACILITY Admitted from facility:  [facility] Level of care:  Skilled Nursing Facility Primary support name:  Dimas Aguas Primary support relationship to patient:  SIBLING Degree of support available:   Adequate    CURRENT CONCERNS Current Concerns  Post-Acute Placement   Other Concerns:    SOCIAL WORK ASSESSMENT / PLAN CSW received referral due to patient being admitted from a facility. CSW reviewed chart and met with patient at bedside. No visitors present.    CSW introduced myself and explained role. Patient reports that he has been living at [facility] for about 9 years and plans to return at dc. Patient reports that family is aware of hospitalization and he will let them know when he dc.    SNF agreeable to accept patient back at dc. FL2 completed in preparation for dc. CSW will continue to follow.   Assessment/plan status:  Psychosocial Support/Ongoing Assessment of Needs Other assessment/ plan:   Information/referral to community resources:   Will return to SNF    PATIENT'S/FAMILY'S RESPONSE TO PLAN OF CARE: Patient alert and oriented. Patient has aphasia and struggles with communicating at times. Patient agreeable to return to SNF at dc.        Coverage for Unice Bailey

## 2012-11-30 NOTE — Progress Notes (Signed)
INITIAL NUTRITION ASSESSMENT  DOCUMENTATION CODES Per approved criteria  -Not Applicable   INTERVENTION: - Diet advancement per MD - RD to monitor intake   NUTRITION DIAGNOSIS: Predicted suboptimal intake related to 1 week of nausea/vomiting as evidenced by MD notes.   Goal: 1. No further nausea/vomiting 2. Advance diet as tolerated to regular/thin liquid diet  Monitor:  Weights, labs, intake, diet advancement, nausea/vomiting  Reason for Assessment: Nutrition risk   67 y.o. male  Admitting Dx: Hematemesis   ASSESSMENT: Pt with history of dementia and prior CVA. Pt from [facility] living facility, called out for pt c/o of n/v x1 week. Staff reports seeing "coffee ground emesis" today, however EMS does not believe the emesis was dark. Pt vomited 4 times yesterday morning. Pt recently discharged from [facility] on 3/14 where he was admitted for chest pain, emesis, and abdominal pain.  Pt unable to provide history, discussed nutrition with [facility]. They report pt with poor intake during the past week due to nausea/vomiting however before then pt was eating good, 50% of meals on regular diet with ground meats. Staff unsure of weight loss. SLP saw pt today with recommendation for regular/thin liquid. No nausea/vomiting reported today.    Height: Ht Readings from Last 1 Encounters:  11/29/12 5\' 10"  (1.778 m)    Weight: Wt Readings from Last 1 Encounters:  11/29/12 189 lb 13.1 oz (86.1 kg)    Ideal Body Weight: 166 lb  % Ideal Body Weight: 114  Wt Readings from Last 10 Encounters:  11/29/12 189 lb 13.1 oz (86.1 kg)  11/25/12 188 lb 11.2 oz (85.594 kg)  11/25/12 188 lb 11.2 oz (85.594 kg)  11/20/12 190 lb 6.4 oz (86.365 kg)  11/20/12 190 lb 6.4 oz (86.365 kg)  11/20/12 190 lb 6.4 oz (86.365 kg)    Usual Body Weight: 190 lb earlier this month  % Usual Body Weight: 99  BMI:  Body mass index is 27.24 kg/(m^2).  Estimated Nutritional Needs: Kcal:  1750-2000 Protein: 75-90g Fluid: 1.7-2L/day  Skin: Abdominal incision  Diet Order: Clear Liquid  EDUCATION NEEDS: -No education needs identified at this time   Intake/Output Summary (Last 24 hours) at 11/30/12 1732 Last data filed at 11/30/12 1600  Gross per 24 hour  Intake 2453.33 ml  Output   2025 ml  Net 428.33 ml    Last BM: PTA  Labs:   Recent Labs Lab 11/26/12 1757 11/27/12 0620 11/29/12 1000 11/30/12 0434  NA  --  141 136 140  K  --  3.9 3.6 3.6  CL  --  108 101 108  CO2  --  22 18* 23  BUN  --  15 22 20   CREATININE  --  1.54* 1.71* 1.68*  CALCIUM  --  8.4 8.7 8.1*  MG 1.8  --   --   --   GLUCOSE  --  97 162* 108*    CBG (last 3)  No results found for this basename: GLUCAP,  in the last 72 hours  Scheduled Meds: . ceFEPime (MAXIPIME) IV  2 g Intravenous Q24H  . hydrALAZINE  25 mg Oral Q8H  . lactulose  20 g Oral BID  . levofloxacin (LEVAQUIN) IV  750 mg Intravenous Q48H  . memantine  10 mg Oral BID  . metoprolol succinate  200 mg Oral Daily  . pantoprazole (PROTONIX) IV  40 mg Intravenous Q12H  . simvastatin  40 mg Oral q1800  . sodium chloride  3  mL Intravenous Q12H  . sucralfate  1 g Oral TID WC & HS  . [START ON 12/01/2012] vancomycin  1,250 mg Intravenous Q24H    Continuous Infusions: . sodium chloride 50 mL/hr at 11/30/12 1041    Past Medical History  Diagnosis Date  . Dementia   . Stroke   . Hydronephrosis   . Hypertension   . Hyperlipemia   . GERD (gastroesophageal reflux disease)   . BPH (benign prostatic hyperplasia)     Past Surgical History  Procedure Laterality Date  . Esophagogastroduodenoscopy N/A 11/11/2012    Procedure: ESOPHAGOGASTRODUODENOSCOPY (EGD);  Surgeon: [name], MD;  Location: [facility] ENDOSCOPY;  Service: Endoscopy;  Laterality: N/A;  . Subxyphoid pericardial window N/A 11/13/2012    Procedure: SUBXYPHOID PERICARDIAL WINDOW;  Surgeon: Delight Ovens, MD;  Location: [facility] OR;  Service: Thoracic;   Laterality: N/A;  . Esophagogastroduodenoscopy N/A 11/25/2012    Procedure: ESOPHAGOGASTRODUODENOSCOPY (EGD);  Surgeon: Vertell Novak., MD;  Location: [facility] ENDOSCOPY;  Service: Endoscopy;  Laterality: N/A;     Levon Hedger MS, RD, LDN [phone] Pager [phone] After Hours Pager

## 2012-11-30 NOTE — Progress Notes (Signed)
11-30-12 NSG:  Foley cath placed, 1500 cc return of tea colored, malodorous urined.  Pt tolerated well.

## 2012-12-01 LAB — CBC
HCT: 25.9 % — ABNORMAL LOW (ref 39.0–52.0)
Hemoglobin: 8.3 g/dL — ABNORMAL LOW (ref 13.0–17.0)
MCV: 79.9 fL (ref 78.0–100.0)
RBC: 3.24 MIL/uL — ABNORMAL LOW (ref 4.22–5.81)
RDW: 14.2 % (ref 11.5–15.5)
WBC: 6.3 10*3/uL (ref 4.0–10.5)

## 2012-12-01 LAB — BASIC METABOLIC PANEL
CO2: 23 mEq/L (ref 19–32)
Chloride: 108 mEq/L (ref 96–112)
GFR calc Af Amer: 56 mL/min — ABNORMAL LOW (ref >90)
Potassium: 3.6 mEq/L (ref 3.5–5.1)

## 2012-12-01 LAB — URINE CULTURE: Colony Count: NO GROWTH

## 2012-12-01 MED ORDER — PANTOPRAZOLE SODIUM 40 MG PO TBEC
40.0000 mg | DELAYED_RELEASE_TABLET | Freq: Two times a day (BID) | ORAL | Status: DC
  Administered 2012-12-01 – 2012-12-04 (×7): 40 mg via ORAL
  Filled 2012-12-01 (×8): qty 1

## 2012-12-01 MED ORDER — PROMETHAZINE HCL 25 MG/ML IJ SOLN
12.5000 mg | Freq: Once | INTRAMUSCULAR | Status: AC | PRN
  Administered 2012-12-01: 12.5 mg via INTRAVENOUS
  Filled 2012-12-01: qty 1

## 2012-12-01 MED ORDER — AMLODIPINE BESYLATE 5 MG PO TABS
5.0000 mg | ORAL_TABLET | Freq: Every day | ORAL | Status: DC
  Administered 2012-12-01 – 2012-12-04 (×4): 5 mg via ORAL
  Filled 2012-12-01 (×4): qty 1

## 2012-12-01 MED ORDER — HYDRALAZINE HCL 50 MG PO TABS
50.0000 mg | ORAL_TABLET | Freq: Three times a day (TID) | ORAL | Status: DC
  Administered 2012-12-01 – 2012-12-04 (×9): 50 mg via ORAL
  Filled 2012-12-01 (×12): qty 1

## 2012-12-01 MED ORDER — TAMSULOSIN HCL 0.4 MG PO CAPS
0.4000 mg | ORAL_CAPSULE | Freq: Every day | ORAL | Status: DC
  Administered 2012-12-01 – 2012-12-04 (×4): 0.4 mg via ORAL
  Filled 2012-12-01 (×4): qty 1

## 2012-12-01 MED ORDER — ATORVASTATIN CALCIUM 20 MG PO TABS
20.0000 mg | ORAL_TABLET | Freq: Every day | ORAL | Status: DC
  Administered 2012-12-02 – 2012-12-03 (×2): 20 mg via ORAL
  Filled 2012-12-01 (×4): qty 1

## 2012-12-01 NOTE — Progress Notes (Signed)
[facility] PROGRESS NOTE  [name] ZOX:[id number] DOB: 09-Feb-1946 DOA: 11/29/2012 PCP: No primary provider on file.  Assessment/Plan: 1. Hematemesis:  - 2 EGDs in the last 3 weeks, EGD 2/26 with large hiatal hernia and mild gastritis and last EGD 3/12 with ulcerative esophagitis, Pathology:  benign focally inflammed mucosa, was discharged on Protonix BID and Carafate  - suspect infection/pneumonia could have been contributing to vomiting on admission - hemoglobin dropped from 9.8 to 7.7, and now 8.3 - Type and cross, CBC change to Q day now  - change PPI to PO, stopped carafate - advance to regular diet, s/p swallow eval yesterday - [facility] following, no further workup needed at this point - Last CT 3/11 rather unremarkable, Last gastric emptying scan 1/09: mildly delayed emptying  - ASA and Plavix continue to be on hold, would recommend holding this for atleast 2 weeks given recurrent hematemesis and resume just one of them then  2. Anemia: acute blood loss anemia, see above -: transfuse if <7, or active bleed   3. Health care associated pneumonia vs Aspiration pneumonia -continue IV Vanc/Cefepime/Levaquin Day 2 today -FU blood Cx, if negative de-escalate Abx tomorrow -swallow eval completed, regular diet and thin liquids recommended  4. Hypertension with sinus tachycardia  -improved, Continue toprol at home dose of 200mg , increase hydralazine dose, hold ACE and resume amlodipine   5. Pericardial effusion: s/p pericardial window on 2/28  -Stable. Incision site looks fine, Fluid analysis was benign   6. Dementia:  -Continue namenda 10 mg   7. 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 Urinary retention: foley placed on admission, attempt voiding trial today, start flomax now -DC foley and monitor  Code Status:  FULL  Family Communication:none at bedside  Disposition Plan: back to SNF in 48H if stable   Consultants:  [facility]  Antibiotics:  Vancomycin 3/16  Cefepime 3/16  Levaquin 3/16   HPI/Subjective: Doing better, no further vomiting, tolerating clears, cough improved  Objective: Filed Vitals:   11/30/12 0848 11/30/12 1407 11/30/12 2110 12/01/12 0530  BP: 124/53 133/68 130/70 157/77  Pulse: 103 97 96 98  Temp:  97.4 F (36.3 C) 97.7 F (36.5 C) 98.2 F (36.8 C)  TempSrc:  Oral Oral Oral  Resp:  20 18   Height:      Weight:      SpO2:  100% 100% 100%    Intake/Output Summary (Last 24 hours) at 12/01/12 1011 Last data filed at 12/01/12 0900  Gross per 24 hour  Intake 1805.83 ml  Output   2200 ml  Net -394.17 ml   Filed Weights   11/29/12 1530  Weight: 86.1 kg (189 lb 13.1 oz)    Exam:   General:  Alert, awake, oriented to self and place only, aphasic  Cardiovascular: S1S2/RRR  Respiratory: basilar ronchi  Abdomen: soft, mildly distended, non tender, BS present  Neuro: long standing R hemiparesis, RUE is spastic  Data Reviewed: Basic Metabolic Panel:  Recent Labs Lab 11/26/12 0450 11/26/12 1757 11/27/12 0620 11/29/12 1000 11/30/12 0434 12/01/12 0750  NA 142  --  141 136 140 140  K 4.2  --  3.9 3.6 3.6 3.6  CL 110  --  108 101 108 108  CO2 23  --  22 18* 23 23  GLUCOSE 102*  --  97 162* 108* 87  BUN 19  --  15 22 20 10   CREATININE 1.56*  --  1.54* 1.71* 1.68* 1.45*  CALCIUM 8.8  --  8.4 8.7 8.1* 8.2*  MG  --  1.8  --   --   --   --    Liver Function Tests:  Recent Labs Lab 11/29/12 1000 11/30/12 0434  AST 12 9  ALT 9 5  ALKPHOS 52 37*  BILITOT 0.2* 0.2*  PROT 6.5 5.1*  ALBUMIN 3.1* 2.3*    Recent Labs Lab 11/29/12 1000  LIPASE 34   No results found for this basename: AMMONIA,  in the last 168 hours CBC:  Recent Labs Lab 11/29/12 1000 11/29/12 1550 11/29/12 2337 11/30/12 0434 11/30/12 1949 12/01/12 0750  WBC 11.2*  10.6* 7.5 6.0 6.9 6.3  NEUTROABS 9.9*  --   --   --   --   --   HGB 9.8* 9.2* 7.7* 7.8* 7.7* 8.3*  HCT 30.8* 29.3* 24.5* 24.6* 24.5* 25.9*  MCV 80.0 80.5 79.8 79.4 80.3 79.9  PLT 527* 373 405* 358 361 342   Cardiac Enzymes:  Recent Labs Lab 11/29/12 1000  TROPONINI <0.30   BNP (last 3 results)  Recent Labs  11/12/12 2000  PROBNP 818.8*   CBG: No results found for this basename: GLUCAP,  in the last 168 hours  Recent Results (from the past 240 hour(s))  CULTURE, BLOOD (ROUTINE X 2)     Status: None   Collection Time    11/29/12  4:15 PM      Result Value Range Status   Specimen Description BLOOD LEFT ARM   Final   Special Requests BOTTLES DRAWN AEROBIC AND ANAEROBIC 1CC EACH   Final   Culture  Setup Time 11/29/2012 20:32   Final   Culture     Final   Value:        BLOOD CULTURE RECEIVED NO GROWTH TO DATE CULTURE WILL BE HELD FOR 5 DAYS BEFORE ISSUING A FINAL NEGATIVE REPORT   Report Status PENDING   Incomplete  CULTURE, BLOOD (ROUTINE X 2)     Status: None   Collection Time    11/29/12  4:24 PM      Result Value Range Status   Specimen Description BLOOD LEFT ARM   Final   Special Requests BOTTLES DRAWN AEROBIC AND ANAEROBIC 1CC EACH   Final   Culture  Setup Time 11/29/2012 20:32   Final   Culture     Final   Value:        BLOOD CULTURE RECEIVED NO GROWTH TO DATE CULTURE WILL BE HELD FOR 5 DAYS BEFORE ISSUING A FINAL NEGATIVE REPORT   Report Status PENDING   Incomplete  MRSA PCR SCREENING     Status: None   Collection Time    11/29/12  4:37 PM      Result Value Range Status   MRSA by PCR NEGATIVE  NEGATIVE Final   Comment:            The GeneXpert MRSA Assay (FDA     approved for NASAL specimens     only), is one component of a     comprehensive MRSA colonization     surveillance program. It is not     intended to diagnose MRSA     infection nor to guide or     monitor treatment for     MRSA infections.     Studies: Dg Chest 1 View  11/29/2012  *RADIOLOGY  REPORT*  Clinical Data: Cough and congestion.  CHEST - 1 VIEW  Comparison: 11/29/2012 AP view of the chest  Findings: A lateral view of the chest demonstrates airspace opacity within the lower lobe. No definite pleural effusion is noted. No acute bony abnormalities are noted.  IMPRESSION: Lower lobe airspace opacity compatible with pneumonia. Radiographic follow up to resolution is recommended.   Original Report Authenticated By: Harmon Pier, M.D.    Dg Abd Acute W/chest  11/29/2012  *RADIOLOGY REPORT*  Clinical Data: Abdominal pain with nausea vomiting.  ACUTE ABDOMEN SERIES (ABDOMEN 2 VIEW & CHEST 1 VIEW)  Comparison: 11/24/2012 and prior radiographs  Findings: Upper limits normal heart size and low lung volumes again noted. Left lower lung atelectasis/consolidation again identified. There is no evidence of pneumothorax. An NG tube is present with tip overlying the proximal stomach.  Gas and stool is noted within the colon and rectum. Nondistended gas-filled loops of small bowel are present. There is no evidence of bowel obstruction or pneumoperitoneum. No acute bony abnormalities are present. A severe thoracolumbar scoliosis is present.  IMPRESSION: Nonspecific nonobstructive bowel gas pattern - no definite evidence of bowel obstruction or pneumoperitoneum.  NG tube with tip overlying the proximal stomach.  Continued left lower lung atelectasis/consolidation.   Original Report Authenticated By: Harmon Pier, M.D.     Scheduled Meds: . ceFEPime (MAXIPIME) IV  2 g Intravenous Q24H  . hydrALAZINE  25 mg Oral Q8H  . lactulose  20 g Oral BID  . levofloxacin (LEVAQUIN) IV  750 mg Intravenous Q48H  . memantine  10 mg Oral BID  . metoprolol succinate  200 mg Oral Daily  . pantoprazole (PROTONIX) IV  40 mg Intravenous Q12H  . simvastatin  40 mg Oral q1800  . sodium chloride  3 mL Intravenous Q12H  . tamsulosin  0.4 mg Oral Daily  . vancomycin  1,250 mg Intravenous Q24H   Continuous Infusions:    Active  Problems:   Dementia   Stroke   Hypertension   Hemiparesis affecting right side as late effect of stroke   Expressive aphasia   Pericardial effusion   Hematemesis   HCAP (healthcare-associated pneumonia)    Time spent:    [facility]  [facility] Pager [phone]. If 7PM-7AM, please contact night-coverage at [email or web address], password [facility] 12/01/2012, 10:11 AM  LOS: 2 days

## 2012-12-01 NOTE — Progress Notes (Addendum)
Patient has had 2 or 3 episodes of emesis since eating his first solid food today at lunch; mostly clear, with a hint of brown and some hamburger meat from lunch.  It appears to be mostly what he ate for lunch.  Zofran has been administered and Dr. Jomarie Longs notified; new orders received to place patient on a clear liquid diet and for a one time phenergan dose, if Zofran does not work.  Patient has a basin at bedside with his call light.  Will continue to monitor.

## 2012-12-01 NOTE — Progress Notes (Signed)
No further vomiting. Hemoglobin stable. Diet advanced to regular, by dietitian.  I will sign off at this time. Call if we can be of further assistance.  Florencia Reasons, M.D. [phone]

## 2012-12-02 ENCOUNTER — Inpatient Hospital Stay (HOSPITAL_COMMUNITY): Payer: Medicare Other

## 2012-12-02 LAB — CBC
HCT: 25.8 % — ABNORMAL LOW (ref 39.0–52.0)
Hemoglobin: 8.2 g/dL — ABNORMAL LOW (ref 13.0–17.0)
MCV: 78.9 fL (ref 78.0–100.0)
Platelets: 360 10*3/uL (ref 150–400)
RBC: 3.27 MIL/uL — ABNORMAL LOW (ref 4.22–5.81)
RDW: 14.1 % (ref 11.5–15.5)

## 2012-12-02 LAB — BASIC METABOLIC PANEL
Chloride: 108 mEq/L (ref 96–112)
Creatinine, Ser: 1.36 mg/dL — ABNORMAL HIGH (ref 0.50–1.35)
GFR calc Af Amer: 61 mL/min — ABNORMAL LOW (ref >90)
GFR calc non Af Amer: 52 mL/min — ABNORMAL LOW (ref >90)
Potassium: 3.4 mEq/L — ABNORMAL LOW (ref 3.5–5.1)

## 2012-12-02 MED ORDER — POLYETHYLENE GLYCOL 3350 17 G PO PACK
17.0000 g | PACK | Freq: Every day | ORAL | Status: DC
  Administered 2012-12-02 – 2012-12-04 (×3): 17 g via ORAL
  Filled 2012-12-02 (×3): qty 1

## 2012-12-02 MED ORDER — LEVOFLOXACIN 750 MG PO TABS
750.0000 mg | ORAL_TABLET | Freq: Every day | ORAL | Status: DC
  Administered 2012-12-02 – 2012-12-03 (×2): 750 mg via ORAL
  Filled 2012-12-02 (×3): qty 1

## 2012-12-02 NOTE — Progress Notes (Signed)
[facility] PROGRESS NOTE  [name] NFA:[id number] DOB: February 28, 1946 DOA: 11/29/2012 PCP: No primary provider on file.  Assessment/Plan: 1. Hematemesis:  - EGD 2/26 with large hiatal hernia and mild gastritis and last -EGD 3/12 with ulcerative esophagitis, Pathology: benign focally inflammed mucosa. - [facility] following, no further workup needed at this point  - ASA and Plavix continue to be on hold, would recommend holding this for atleast 2 weeks given recurrent hematemesis and resume just one of them. -Will try to advance diet today.  -Add miralax for bowel regimen.   2. Anemia: acute blood loss anemia, see above. Hb at 8.2 stable.  3. Health care associated pneumonia vs Aspiration pneumonia  -Received IV Vanc/Cefepime for  3 days.  -Will continue with Levaquin, total day of antibiotics 3.  -swallow eval completed, regular diet and thin liquids recommended.  4. Hypertension:  -Continue with Norvasc, hydralazine, metoprolol.  5. Pericardial effusion: s/p pericardial window on 2/28  -Stable. 6. Dementia:  -Continue namenda 10 mg  7. 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 Urinary retention: foley placed on admission. Patient had multiple I and O cath. Still with urine retention. Continue with Flomax. Will place foley catheter again. Check Renal US, prior history of left hydroureteronephrosis. UA no growth. UA : negative.   Code Status: FULL  Family Communication: none at bedside  Disposition Plan: back to SNF in 48H if stable.  Consultants:  Dr Matthias Hughs.  Procedures:  none  Antibiotics: Vancomycin 3/16 3/19 Cefepime 3/16 --3/19 Levaquin 3/16   HPI/Subjective: Aphasic. Wants to go home. Willing to eat.   Objective: Filed Vitals:   12/01/12 0530 12/01/12 1506 12/01/12 2100 12/02/12 0545  BP: 157/77 136/77  126/65 115/73  Pulse: 98 96 99 93  Temp: 98.2 F (36.8 C) 98.4 F (36.9 C) 99.9 F (37.7 C) 98.7 F (37.1 C)  TempSrc: Oral Oral Oral Oral  Resp:  18 19 19   Height:      Weight:      SpO2: 100% 100% 100% 100%    Intake/Output Summary (Last 24 hours) at 12/02/12 0956 Last data filed at 12/02/12 0715  Gross per 24 hour  Intake 1123.33 ml  Output   1300 ml  Net -176.67 ml   Filed Weights   11/29/12 1530  Weight: 86.1 kg (189 lb 13.1 oz)    Exam:   General:  No distress  Cardiovascular: S 1, S 2 RRR  Respiratory: CTA  Abdomen: bs present, soft, nt  Musculoskeletal: no edema.   Data Reviewed: Basic Metabolic Panel:  Recent Labs Lab 11/26/12 1757 11/27/12 0620 11/29/12 1000 11/30/12 0434 12/01/12 0750 12/02/12 0447  NA  --  141 136 140 140 139  K  --  3.9 3.6 3.6 3.6 3.4*  CL  --  108 101 108 108 108  CO2  --  22 18* 23 23 22   GLUCOSE  --  97 162* 108* 87 96  BUN  --  15 22 20 10 9   CREATININE  --  1.54* 1.71* 1.68* 1.45* 1.36*  CALCIUM  --  8.4 8.7 8.1* 8.2* 8.2*  MG 1.8  --   --   --   --   --    Liver Function Tests:  Recent Labs Lab 11/29/12 1000 11/30/12 0434  AST 12 9  ALT 9 5  ALKPHOS  52 37*  BILITOT 0.2* 0.2*  PROT 6.5 5.1*  ALBUMIN 3.1* 2.3*    Recent Labs Lab 11/29/12 1000  LIPASE 34   No results found for this basename: AMMONIA,  in the last 168 hours CBC:  Recent Labs Lab 11/29/12 1000  11/29/12 2337 11/30/12 0434 11/30/12 1949 12/01/12 0750 12/02/12 0447  WBC 11.2*  < > 7.5 6.0 6.9 6.3 5.8  NEUTROABS 9.9*  --   --   --   --   --   --   HGB 9.8*  < > 7.7* 7.8* 7.7* 8.3* 8.2*  HCT 30.8*  < > 24.5* 24.6* 24.5* 25.9* 25.8*  MCV 80.0  < > 79.8 79.4 80.3 79.9 78.9  PLT 527*  < > 405* 358 361 342 360  < > = values in this interval not displayed. Cardiac Enzymes:  Recent Labs Lab 11/29/12 1000  TROPONINI <0.30   BNP (last 3 results)  Recent Labs  11/12/12 2000  PROBNP 818.8*   CBG: No results found for this  basename: GLUCAP,  in the last 168 hours  Recent Results (from the past 240 hour(s))  CULTURE, BLOOD (ROUTINE X 2)     Status: None   Collection Time    11/29/12  4:15 PM      Result Value Range Status   Specimen Description BLOOD LEFT ARM   Final   Special Requests BOTTLES DRAWN AEROBIC AND ANAEROBIC 1CC EACH   Final   Culture  Setup Time 11/29/2012 20:32   Final   Culture     Final   Value:        BLOOD CULTURE RECEIVED NO GROWTH TO DATE CULTURE WILL BE HELD FOR 5 DAYS BEFORE ISSUING A FINAL NEGATIVE REPORT   Report Status PENDING   Incomplete  CULTURE, BLOOD (ROUTINE X 2)     Status: None   Collection Time    11/29/12  4:24 PM      Result Value Range Status   Specimen Description BLOOD LEFT ARM   Final   Special Requests BOTTLES DRAWN AEROBIC AND ANAEROBIC 1CC EACH   Final   Culture  Setup Time 11/29/2012 20:32   Final   Culture     Final   Value:        BLOOD CULTURE RECEIVED NO GROWTH TO DATE CULTURE WILL BE HELD FOR 5 DAYS BEFORE ISSUING A FINAL NEGATIVE REPORT   Report Status PENDING   Incomplete  MRSA PCR SCREENING     Status: None   Collection Time    11/29/12  4:37 PM      Result Value Range Status   MRSA by PCR NEGATIVE  NEGATIVE Final   Comment:            The GeneXpert MRSA Assay (FDA     approved for NASAL specimens     only), is one component of a     comprehensive MRSA colonization     surveillance program. It is not     intended to diagnose MRSA     infection nor to guide or     monitor treatment for     MRSA infections.  URINE CULTURE     Status: None   Collection Time    11/30/12  1:24 PM      Result Value Range Status   Specimen Description URINE, CATHETERIZED   Final   Special Requests NONE   Final   Culture  Setup Time 11/30/2012 23:00   Final  Colony Count NO GROWTH   Final   Culture NO GROWTH   Final   Report Status 12/01/2012 FINAL   Final     Studies: No results found.  Scheduled Meds: . amLODipine  5 mg Oral Daily  . atorvastatin  20  mg Oral q1800  . ceFEPime (MAXIPIME) IV  2 g Intravenous Q24H  . hydrALAZINE  50 mg Oral Q8H  . lactulose  20 g Oral BID  . levofloxacin (LEVAQUIN) IV  750 mg Intravenous Q48H  . memantine  10 mg Oral BID  . metoprolol succinate  200 mg Oral Daily  . pantoprazole  40 mg Oral BID  . polyethylene glycol  17 g Oral Daily  . sodium chloride  3 mL Intravenous Q12H  . tamsulosin  0.4 mg Oral Daily  . vancomycin  1,250 mg Intravenous Q24H   Continuous Infusions:   Active Problems:   Dementia   Stroke   Hypertension   Hemiparesis affecting right side as late effect of stroke   Expressive aphasia   Pericardial effusion   Hematemesis   HCAP (healthcare-associated pneumonia)    Time spent: 25 minutes.    Keisean Skowron  [facility] Pager [phone]. If 7PM-7AM, please contact night-coverage at [email or web address], password [facility] 12/02/2012, 9:56 AM  LOS: 3 days

## 2012-12-02 NOTE — Progress Notes (Signed)
PHARMACIST - PHYSICIAN COMMUNICATION DR:   [facility] CONCERNING: Antibiotic IV to Oral Route Change Policy  RECOMMENDATION: This patient is receiving levofloxacin by the intravenous route.  Based on criteria approved by the Pharmacy and Therapeutics Committee, the antibiotic(s) is/are being converted to the equivalent oral dose form(s).   DESCRIPTION: These criteria include:  Patient being treated for a respiratory tract infection, urinary tract infection, or cellulitis  The patient is not neutropenic and does not exhibit a GI malabsorption state  The patient is eating (either orally or via tube) and/or has been taking other orally administered medications for a least 24 hours  The patient is improving clinically and has a Tmax < 100.5  If you have questions about this conversion, please contact the Pharmacy Department  []   [phone] )  [facility] []   [phone] )  [facility]  []   [phone] )  [facility] [x]   [phone] )  [facility]   Due to improved renal function will also adjust dose to levofloxacin 750mg  PO q24h  Thanks.  Juliette Alcide, PharmD, BCPS.   Pager: [phone] 12/02/2012 10:12 AM

## 2012-12-02 NOTE — Progress Notes (Signed)
Pt's Foley d/ced 3/18 at 1430. Pt has not voided and does not feel urge to void. According to Post Removal Catheter Care standing orders I will I&O cath patient and continue to monitor for pt to spontaneously void.  Earnest Conroy. Clelia Croft, RN

## 2012-12-03 LAB — CBC
HCT: 26.9 % — ABNORMAL LOW (ref 39.0–52.0)
Hemoglobin: 8.6 g/dL — ABNORMAL LOW (ref 13.0–17.0)
MCH: 25.1 pg — ABNORMAL LOW (ref 26.0–34.0)
MCV: 78.7 fL (ref 78.0–100.0)
RBC: 3.42 MIL/uL — ABNORMAL LOW (ref 4.22–5.81)

## 2012-12-03 LAB — BASIC METABOLIC PANEL
CO2: 23 mEq/L (ref 19–32)
Calcium: 8.6 mg/dL (ref 8.4–10.5)
Chloride: 109 mEq/L (ref 96–112)
Creatinine, Ser: 1.38 mg/dL — ABNORMAL HIGH (ref 0.50–1.35)
Glucose, Bld: 103 mg/dL — ABNORMAL HIGH (ref 70–99)

## 2012-12-03 NOTE — Progress Notes (Signed)
[facility] PROGRESS NOTE  [name] ZOX:[id number] DOB: [birth year] DOA: 11/29/2012 PCP: No primary provider on file.  Assessment/Plan: 1. Hematemesis:  - EGD 2/26 with large hiatal hernia and mild gastritis and last -EGD 3/12 with ulcerative esophagitis, Pathology: benign focally inflammed mucosa. - [facility] following, no further workup needed at this point  - ASA and Plavix continue to be on hold for 2 weeks.  -Patient tolerates full liquid diet. Advance diet to regular today, if tolerates diet plan for discharge tomorrow.   2. Anemia: acute blood loss anemia, see above. Hb at 8.2 stable.  3. Health care associated pneumonia vs Aspiration pneumonia  -Received IV Vanc/Cefepime for  3 days.  -Will continue with Levaquin, total day of antibiotics 4.  -swallow eval completed, regular diet and thin liquids recommended.  4. Hypertension:  -Continue with Norvasc, hydralazine, metoprolol.  5. Pericardial effusion: s/p pericardial window on 2/28  -Stable. 6. Dementia:  -Continue namenda 10 mg  7. 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 Urinary retention: foley placed on admission. Patient had multiple I and O cath. Still with urine retention. Continue with Flomax. Patient will need to be discharge with foley catheter. Discussed care with Dr Edgar Frisk with urology who recommend outpatient follow up in 2 week for voiding trial and further evaluation. Dr Edgar Frisk reviewed renal US he think is less likely a mass. Patient had prior CT scan that didn't show a mass on bladder.   Code Status: FULL  Family Communication: none at bedside  Disposition Plan: back to SNF in 48H if stable.  Consultants:  Dr Matthias Hughs.  Procedures: Renal US Bilateral renal parenchymal atrophy, greater on the left with small  cysts in the kidneys. No hydronephrosis.  The bladder is  decompressed with Foley catheter is not well visualized.  Heterogeneous echotexture material in the bladder surrounding the  catheter may represent complex fluid or mass.    Antibiotics: Vancomycin 3/16 3/19 Cefepime 3/16 --3/19 Levaquin 3/16   HPI/Subjective: Aphasic. Wants to go home. Willing to eat.   Objective: Filed Vitals:   12/02/12 0545 12/02/12 1448 12/02/12 2225 12/03/12 0439  BP: 115/73 118/65 131/76 141/81  Pulse: 93 100 97 97  Temp: 98.7 F (37.1 C) 99.2 F (37.3 C) 97.9 F (36.6 C) 98.2 F (36.8 C)  TempSrc: Oral Oral Oral Oral  Resp: 19 20 18 16   Height:      Weight:      SpO2: 100% 99% 99% 100%    Intake/Output Summary (Last 24 hours) at 12/03/12 1305 Last data filed at 12/03/12 1030  Gross per 24 hour  Intake    720 ml  Output    880 ml  Net   -160 ml   Filed Weights   11/29/12 1530  Weight: 86.1 kg (189 lb 13.1 oz)    Exam:   General:  No distress  Cardiovascular: S 1, S 2 RRR  Respiratory: CTA  Abdomen: bs present, soft, nt  Musculoskeletal: no edema.   Data Reviewed: Basic Metabolic Panel:  Recent Labs Lab 11/26/12 1757  11/29/12 1000 11/30/12 0434 12/01/12 0750 12/02/12 0447 12/03/12 0454  NA  --   < > 136 140 140 139 141  K  --   < > 3.6 3.6 3.6 3.4* 3.7  CL  --   < > 101 108 108 108 109  CO2  --   < > 18* 23 23 22 23   GLUCOSE  --   < > 162* 108* 87 96 103*  BUN  --   < > 22 20 10 9 8   CREATININE  --   < > 1.71* 1.68* 1.45* 1.36* 1.38*  CALCIUM  --   < > 8.7 8.1* 8.2* 8.2* 8.6  MG 1.8  --   --   --   --   --   --   < > = values in this interval not displayed. Liver Function Tests:  Recent Labs Lab 11/29/12 1000 11/30/12 0434  AST 12 9  ALT 9 5  ALKPHOS 52 37*  BILITOT 0.2* 0.2*  PROT 6.5 5.1*  ALBUMIN 3.1* 2.3*    Recent Labs Lab 11/29/12 1000  LIPASE 34   No results found for this basename: AMMONIA,  in the last 168 hours CBC:  Recent Labs Lab 11/29/12 1000  11/30/12 0434  11/30/12 1949 12/01/12 0750 12/02/12 0447 12/03/12 0454  WBC 11.2*  < > 6.0 6.9 6.3 5.8 5.4  NEUTROABS 9.9*  --   --   --   --   --   --   HGB 9.8*  < > 7.8* 7.7* 8.3* 8.2* 8.6*  HCT 30.8*  < > 24.6* 24.5* 25.9* 25.8* 26.9*  MCV 80.0  < > 79.4 80.3 79.9 78.9 78.7  PLT 527*  < > 358 361 342 360 369  < > = values in this interval not displayed. Cardiac Enzymes:  Recent Labs Lab 11/29/12 1000  TROPONINI <0.30   BNP (last 3 results)  Recent Labs  11/12/12 2000  PROBNP 818.8*   CBG: No results found for this basename: GLUCAP,  in the last 168 hours  Recent Results (from the past 240 hour(s))  CULTURE, BLOOD (ROUTINE X 2)     Status: None   Collection Time    11/29/12  4:15 PM      Result Value Range Status   Specimen Description BLOOD LEFT ARM   Final   Special Requests BOTTLES DRAWN AEROBIC AND ANAEROBIC 1CC EACH   Final   Culture  Setup Time 11/29/2012 20:32   Final   Culture     Final   Value:        BLOOD CULTURE RECEIVED NO GROWTH TO DATE CULTURE WILL BE HELD FOR 5 DAYS BEFORE ISSUING A FINAL NEGATIVE REPORT   Report Status PENDING   Incomplete  CULTURE, BLOOD (ROUTINE X 2)     Status: None   Collection Time    11/29/12  4:24 PM      Result Value Range Status   Specimen Description BLOOD LEFT ARM   Final   Special Requests BOTTLES DRAWN AEROBIC AND ANAEROBIC 1CC EACH   Final   Culture  Setup Time 11/29/2012 20:32   Final   Culture     Final   Value:        BLOOD CULTURE RECEIVED NO GROWTH TO DATE CULTURE WILL BE HELD FOR 5 DAYS BEFORE ISSUING A FINAL NEGATIVE REPORT   Report Status PENDING   Incomplete  MRSA PCR SCREENING     Status: None   Collection Time    11/29/12  4:37 PM      Result Value Range Status   MRSA by PCR NEGATIVE  NEGATIVE Final   Comment:            The GeneXpert MRSA Assay (FDA     approved  for NASAL specimens     only), is one component of a     comprehensive MRSA colonization     surveillance program. It is not     intended to diagnose  MRSA     infection nor to guide or     monitor treatment for     MRSA infections.  URINE CULTURE     Status: None   Collection Time    11/30/12  1:24 PM      Result Value Range Status   Specimen Description URINE, CATHETERIZED   Final   Special Requests NONE   Final   Culture  Setup Time 11/30/2012 23:00   Final   Colony Count NO GROWTH   Final   Culture NO GROWTH   Final   Report Status 12/01/2012 FINAL   Final     Studies: US Renal  12/02/2012  *RADIOLOGY REPORT*  Clinical Data: Urinary retention.  RENAL/URINARY TRACT ULTRASOUND COMPLETE  Comparison:  CT abdomen and pelvis 11/24/2012  Findings:  Right Kidney:  The right kidney measures 10.9 cm in length.  There is a small cyst in the upper mid pole measuring about 1.9 cm maximal diameter.  No hydronephrosis.  Mild renal parenchymal thinning.  Left Kidney:  The left kidney measures 9.3 cm length.  Diffuse parenchymal atrophy with increased echotexture suggesting chronic renal disease.  No hydronephrosis.  Small cysts are demonstrated in the parenchyma, largest measuring about 1.6 cm diameter.  Bladder:  The bladder is decompressed with Foley catheter and is not well visualized.  There is increased heterogeneous echotexture in the decompressed bladder around the catheter.  This is nonspecific and could represent a bladder mass lesion or complex fluid.  A cystogram may be useful in further evaluation if clinically  IMPRESSION: Bilateral renal parenchymal atrophy, greater on the left with small cysts in the kidneys.  No hydronephrosis.  The bladder is decompressed with Foley catheter is not well visualized. Heterogeneous echotexture material in the bladder surrounding the catheter may represent  complex fluid or mass.   Original Report Authenticated By: Burman Nieves, M.D.     Scheduled Meds: . amLODipine  5 mg Oral Daily  . atorvastatin  20 mg Oral q1800  . hydrALAZINE  50 mg Oral Q8H  . lactulose  20 g Oral BID  . levofloxacin  750 mg  Oral q1800  . memantine  10 mg Oral BID  . metoprolol succinate  200 mg Oral Daily  . pantoprazole  40 mg Oral BID  . polyethylene glycol  17 g Oral Daily  . sodium chloride  3 mL Intravenous Q12H  . tamsulosin  0.4 mg Oral Daily   Continuous Infusions:   Active Problems:   Dementia   Stroke   Hypertension   Hemiparesis affecting right side as late effect of stroke   Expressive aphasia   Pericardial effusion   Hematemesis   HCAP (healthcare-associated pneumonia)    Time spent: 25 minutes.    Lydiann Bonifas  [facility] Pager [phone]. If 7PM-7AM, please contact night-coverage at [email or web address], password [facility] 12/03/2012, 1:05 PM  LOS: 4 days

## 2012-12-04 ENCOUNTER — Inpatient Hospital Stay (HOSPITAL_COMMUNITY): Payer: Medicare Other

## 2012-12-04 MED ORDER — PEG 3350-KCL-NA BICARB-NACL 420 G PO SOLR
240.0000 mL | Freq: Once | ORAL | Status: AC
  Administered 2012-12-04: 240 mL via ORAL

## 2012-12-04 MED ORDER — MINERAL OIL RE ENEM
1.0000 | ENEMA | Freq: Once | RECTAL | Status: AC
  Administered 2012-12-04: 1 via RECTAL
  Filled 2012-12-04: qty 1

## 2012-12-04 MED ORDER — AMLODIPINE BESYLATE 5 MG PO TABS: 5.0000 mg | ORAL_TABLET | Freq: Every day | ORAL | Status: DC

## 2012-12-04 MED ORDER — ATORVASTATIN CALCIUM 20 MG PO TABS: 20.0000 mg | ORAL_TABLET | Freq: Every day | ORAL | Status: AC

## 2012-12-04 MED ORDER — LEVOFLOXACIN 750 MG PO TABS: 750.0000 mg | ORAL_TABLET | Freq: Every day | ORAL | Status: DC

## 2012-12-04 MED ORDER — POLYETHYLENE GLYCOL 3350 17 G PO PACK: 17.0000 g | PACK | Freq: Every day | ORAL | Status: AC

## 2012-12-04 NOTE — Discharge Summary (Signed)
Physician Discharge Summary  [name]:[id number] DOB: 03-14-1946 DOA: 11/29/2012  PCP: No primary provider on file.  Admit date: 11/29/2012 Discharge date: 12/04/2012  Time spent: 35 minutes  Recommendations for Outpatient Follow-up:  1. Follow up with urologist Dr Penne Lash within  2 weeks. 2. Need CBC to follow hb 3. B-met to follow renal function.   Discharge Diagnoses:    Hematemesis   HCAP (healthcare-associated pneumonia)   Dementia   Stroke   Hypertension   Hemiparesis affecting right side as late effect of stroke   Expressive aphasia   Pericardial effusion      Discharge Condition: stable  Diet recommendation: Heart Healthy  Filed Weights   11/29/12 1530  Weight: 86.1 kg (189 lb 13.1 oz)    History of present illness:  [name] is a 67 y.o. male with PMH of CVA, aphasia, R sided hemiparesis, dementia, HTN, h/o pericardial effusion s/p pericardial window was just discharged from [facility] 2 days ago after evaluation for hematemesis, he had an EGD which revealed ulcerative esophagitis, biopsies revealed benign inflammation.  He was discharged back to SNF on 3/14 after tolerating a diet, pt is unable to provide a meaningful hsitory.  I called the SNF and they report that he threw up 4 times today and his vomitus was coffee ground, he reportedly vomited yesterday as well.  He reports mild abdominal pain, and some cough.  Upon evaluation in the ER, he is noted to be mildly tachycardic, Hb stable and unchanged from prior.   Hospital Course:  Patient admitted with hematemesis, Health care associated PNA. GI was consulted, no further endoscopy. Patient has had multiple endoscopy last one 3/12 which show ulcerative esophagitis. We continue with protonix. He was treated also with IV antibiotics IV vancomycin and cefepime for 3 days. He was subsequently transition to Levaquin.  1. Hematemesis:  - EGD 2/26 with large hiatal hernia and mild gastritis and last -EGD  3/12 with ulcerative esophagitis, Pathology: benign focally inflammed mucosa.  - [facility] following, no further workup needed at this point  - ASA and Plavix continue to be on hold for 2 weeks.  -Patient tolerates regular diet. Plan to discharge today.  2. Anemia: acute blood loss anemia, see above. Hb at 8.2 stable.  3. Health care associated pneumonia vs Aspiration pneumonia  -Received IV Vanc/Cefepime for 3 days.  -Will continue with Levaquin, total day of antibiotics 5.  -swallow eval completed, regular diet and thin liquids recommended.  4. Hypertension:  -Continue with Norvasc, hydralazine, metoprolol.  5. Pericardial effusion: s/p pericardial window on 2/28  -Stable.  6. Dementia:  -Continue namenda 10 mg  7. 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 Urinary retention: foley placed on admission. Patient had multiple I and O cath. Still with urine retention. Continue with Flomax. Patient will need to be discharge with foley catheter. Discussed care with Dr Edgar Frisk with urology who recommend outpatient follow up in 2 week for voiding trial and further evaluation. Dr Edgar Frisk reviewed renal US he think is less likely a mass. Patient had prior CT scan that didn't show a mass on bladder.  10.Constipation: will give fleet enema prior to discharge.    Procedures: Renal US: Bilateral renal parenchymal atrophy, greater on the left with small  cysts in the kidneys. No hydronephrosis. The bladder is  decompressed  with Foley catheter is not well visualized.  Heterogeneous echotexture material in the bladder surrounding the  catheter may represent complex fluid or mass.   Consultations:  Dr Matthias Hughs  Discharge Exam: Filed Vitals:   12/03/12 0439 12/03/12 1438 12/03/12 2221 12/04/12 0639  BP: 141/81 111/53 144/82 131/71  Pulse: 97 94 92 91   Temp: 98.2 F (36.8 C) 99.6 F (37.6 C) 98.5 F (36.9 C) 97.7 F (36.5 C)  TempSrc: Oral Oral Oral Oral  Resp: 16 18 20 20   Height:      Weight:      SpO2: 100% 100% 100% 100%    General: No distress. Cardiovascular: S 1, S 2 RRR Respiratory: CTA  Discharge Instructions  Discharge Orders   Future Orders Complete By Expires     Diet - low sodium heart healthy  As directed     Increase activity slowly  As directed         Medication List    STOP taking these medications       lisinopril 10 MG tablet  Commonly known as:  PRINIVIL,ZESTRIL     metoprolol 50 MG tablet  Commonly known as:  LOPRESSOR     simvastatin 40 MG tablet  Commonly known as:  ZOCOR     sucralfate 1 GM/10ML suspension  Commonly known as:  CARAFATE      TAKE these medications       amLODipine 5 MG tablet  Commonly known as:  NORVASC  Take 1 tablet (5 mg total) by mouth daily.     atorvastatin 20 MG tablet  Commonly known as:  LIPITOR  Take 1 tablet (20 mg total) by mouth daily at 6 PM.     citalopram 10 MG tablet  Commonly known as:  CELEXA  Take 5 mg by mouth daily.     docusate sodium 100 MG capsule  Commonly known as:  COLACE  Take 100 mg by mouth 2 (two) times daily.     hydrALAZINE 50 MG tablet  Commonly known as:  APRESOLINE  Take 50 mg by mouth 3 (three) times daily.     lactulose 10 GM/15ML solution  Commonly known as:  CHRONULAC  Take 30 mLs by mouth 2 (two) times daily.     levofloxacin 750 MG tablet  Commonly known as:  LEVAQUIN  Take 1 tablet (750 mg total) by mouth daily at 6 PM.     memantine 10 MG tablet  Commonly known as:  NAMENDA  Take 10 mg by mouth 2 (two) times daily.     metoCLOPramide 5 MG tablet  Commonly known as:  REGLAN  Take 1 tablet (5 mg total) by mouth 4 (four) times daily -  before meals and at bedtime.     metoprolol 200 MG 24 hr tablet  Commonly known as:  TOPROL-XL  Take 1 tablet (200 mg total) by mouth daily. Take with or immediately  following a meal.     omeprazole 20 MG capsule  Commonly known as:  PRILOSEC  Take 1 capsule (20 mg total) by mouth 2 (two) times daily.     polyethylene glycol packet  Commonly known as:  MIRALAX / GLYCOLAX  Take 17 g by mouth daily.     tamsulosin 0.4 MG Caps  Commonly known as:  FLOMAX  Take 0.4 mg by mouth daily.           Follow-up Information   Follow up with DAHLSTEDT, Bertram Millard, MD In 2 weeks.  Contact information:   [address] [place] [address] [phone]        The results of significant diagnostics from this hospitalization (including imaging, microbiology, ancillary and laboratory) are listed below for reference.    Significant Diagnostic Studies: Ct Abdomen Pelvis Wo Contrast  11/24/2012  *RADIOLOGY REPORT*  Clinical Data: Abdominal pain  CT ABDOMEN AND PELVIS WITHOUT CONTRAST  Technique:  Multidetector CT imaging of the abdomen and pelvis was performed following the standard protocol without intravenous contrast.  Comparison: 11/22/2012 radiographs, 11/10/2012 CT  Findings: Hiatal hernia.  NG tube terminates at the level of the hiatus.  Mild bibasilar opacities.  Heart size upper normal to mildly enlarged. Pericardial effusion has decreased in size, with minimal residual.  There is a small amount of mediastinal air. Correlate clinically for interval pericardiocentesis.  Organ abnormality/lesion detection is limited in the absence of intravenous contrast. Within this limitation, unremarkable liver, spleen, pancreas, adrenal glands, biliary system.  Lobular renal contours with incompletely characterized lesions, similar to recent prior.  The hydroureteronephrosis has decreased/resolved on the left.  No CT evidence for colitis. Nonspecific rectal wall thickening, similar to prior.  Small bowel loops are relatively decompressed.  Normal appendix. No free intraperitoneal air or fluid.  No lymphadenopathy.  There is scattered atherosclerotic  calcification of the aorta and its branches. No aneurysmal dilatation.  Thin-walled bladder.  Multilevel degenerative changes of the imaged spine. No acute or aggressive appearing osseous lesion.  Thoracolumbar curvature is unchanged.  There is a right femoral approach central venous catheter with tip near the right common iliac confluence.  IMPRESSION: Interval decrease in size of the pericardial effusion with development of a small amount of mediastinal air.  Correlate clinically for interval drainage.  Hiatal hernia.  NG tube tip just below the hiatus.  Bowel loops are relatively decompressed, without evidence for obstruction.  Interval decreased dilatation of the left renal collecting system, with resolution of the hydroureteronephrosis.  Bilateral incompletely characterized renal lesions.  Nonspecific rectal wall thickening, similar to prior.   Original Report Authenticated By: Jearld Lesch, M.D.    Ct Abdomen Pelvis Wo Contrast  11/11/2012  *RADIOLOGY REPORT*  Clinical Data: Distended abdomen with abdominal pain.  CT ABDOMEN AND PELVIS WITHOUT CONTRAST  Technique:  Multidetector CT imaging of the abdomen and pelvis was performed following the standard protocol without intravenous contrast.  Comparison: 12/10/2009  Findings: Small bilateral pleural effusions with atelectasis or infiltration in both lung bases.  Moderate sized pericardial effusion.  Cardiac enlargement.  Moderate sized esophageal hiatal hernia.  The unenhanced appearance of the liver, spleen, gallbladder, pancreas, adrenal glands, and retroperitoneal lymph nodes is unremarkable.  Mild calcification of the abdominal aorta without aneurysm.  Multiple parenchymal cysts in the right kidney corresponding to the previous study.  Largest is in the upper pole and measures 1.7 cm.  There is a hyper dense lesion in the upper pole of the right kidney posteriorly measuring 7 mm consistent with a hemorrhagic cyst.  No hydronephrosis of the right  kidney.  The left kidney demonstrates marked hydronephrosis and ureterectasis with dilated and tortuous aorta down to the level of the bladder. There is marked parenchymal atrophy in the left kidney suggesting that this represents a longstanding process.  The appearance is similar to the previous study, allowing for technical differences. The stomach, small bowel, and colon are not abnormally distended. No abdominal ascites.  Pelvis:  There is marked distension of the bladder suggesting possible bladder outlet obstruction  versus dysmotility.  The prostate gland is not appear to be enlarged.  The appearance is stable since the previous study.  No free or loculated pelvic fluid collections.  The rectum is decompressed but there is apparent thickening of the wall of the anorectal junction.  Was present previously.  This could represent chronic inflammatory process. Mass lesion can also have this appearance.  The appendix is normal. No diverticulitis.  Lumbar scoliosis convex towards the right.  Degenerative changes in the lumbar spine and hips.  IMPRESSION: Small bilateral pleural effusions with basilar atelectasis or infiltration.  Cardiac enlargement with pericardial effusion. Prominent pyelocaliectasis and ureterectasis of the left kidney with parenchymal atrophy.  Distended bladder without wall thickening.  Thickening of the wall of the anorectal junction. These findings are similar to previous study.  Clinical appearance of abdominal distension is likely to be due to the distended bladder.   Original Report Authenticated By: Burman Nieves, M.D.    Dg Chest 1 View  11/29/2012  *RADIOLOGY REPORT*  Clinical Data: Cough and congestion.  CHEST - 1 VIEW  Comparison: 11/29/2012 AP view of the chest  Findings: A lateral view of the chest demonstrates airspace opacity within the lower lobe. No definite pleural effusion is noted. No acute bony abnormalities are noted.  IMPRESSION: Lower lobe airspace opacity compatible  with pneumonia. Radiographic follow up to resolution is recommended.   Original Report Authenticated By: Harmon Pier, M.D.    Dg Chest 2 View  11/11/2012  *RADIOLOGY REPORT*  Clinical Data: Chest pain.  CHEST - 2 VIEW  Comparison: 10/23/2007 and prior chest radiographs  Findings: Cardiomegaly and peribronchial thickening again noted. Left basilar scarring/density is unchanged. There is no evidence of pleural effusion, pneumothorax or pulmonary edema. No acute bony abnormalities are identified.  IMPRESSION: Cardiomegaly without evidence of acute cardiopulmonary disease.   Original Report Authenticated By: Harmon Pier, M.D.    Dg Abd 1 View  11/16/2012  *RADIOLOGY REPORT*  Clinical Data: With ileus follow up  ABDOMEN - 1 VIEW  Comparison: Prior abdominal radiographs 11/15/2012  Findings: Single frontal view the abdomen demonstrates a nasogastric tube with the tip in the prepyloric gastric antrum versus proximal duodenum. Slightly decreased gaseous distension of multiple loops of bowel in the left hemiabdomen.  No large free air identified.  Stable advanced dextroconvex scoliosis of the thoracolumbar junction with multilevel degenerative changes throughout the spine.  IMPRESSION:  1.  Slight interval decrease in the underlying ileus pattern. 2.  The tip of nasogastric tube is in the prepyloric antrum or proximal duodenum.   Original Report Authenticated By: Malachy Moan, M.D.    Ct Head Wo Contrast  11/12/2012  *RADIOLOGY REPORT*  Clinical Data: 67 year old male with right hemipareses.  History of dementia, prior infarct  CT HEAD WITHOUT CONTRAST  Technique:  Contiguous axial images were obtained from the base of the skull through the vertex without contrast.  Comparison: None  Findings: Left hemispheric encephalomalacia noted. Chronic small vessel white matter ischemic changes are present. A 1.7 cm calcified area in the left periventricular region is likely chronic. No acute intracranial abnormalities are  identified, including mass effect, hydrocephalus, extra-axial fluid collection, midline shift, hemorrhage, or acute infarction.  Left frontotemporal craniectomy noted.  IMPRESSION: No evidence of acute intracranial abnormality.  Left hemispheric encephalomalacia and chronic small vessel white matter ischemic changes.   Original Report Authenticated By: Harmon Pier, M.D.    US Renal  12/02/2012  *RADIOLOGY REPORT*  Clinical Data: Urinary retention.  RENAL/URINARY TRACT ULTRASOUND  COMPLETE  Comparison:  CT abdomen and pelvis 11/24/2012  Findings:  Right Kidney:  The right kidney measures 10.9 cm in length.  There is a small cyst in the upper mid pole measuring about 1.9 cm maximal diameter.  No hydronephrosis.  Mild renal parenchymal thinning.  Left Kidney:  The left kidney measures 9.3 cm length.  Diffuse parenchymal atrophy with increased echotexture suggesting chronic renal disease.  No hydronephrosis.  Small cysts are demonstrated in the parenchyma, largest measuring about 1.6 cm diameter.  Bladder:  The bladder is decompressed with Foley catheter and is not well visualized.  There is increased heterogeneous echotexture in the decompressed bladder around the catheter.  This is nonspecific and could represent a bladder mass lesion or complex fluid.  A cystogram may be useful in further evaluation if clinically  IMPRESSION: Bilateral renal parenchymal atrophy, greater on the left with small cysts in the kidneys.  No hydronephrosis.  The bladder is decompressed with Foley catheter is not well visualized. Heterogeneous echotexture material in the bladder surrounding the catheter may represent  complex fluid or mass.   Original Report Authenticated By: Burman Nieves, M.D.    Dg Chest Portable 1 View  11/24/2012  *RADIOLOGY REPORT*  Clinical Data: Chest pain  PORTABLE CHEST - 1 VIEW  Comparison: 11/22/2012 radiograph, 11/11/2012 CT.  Findings: Hypoaeration.  Prominent cardiomediastinal contours, similar to prior.   Retrocardiac process and small effusions not excluded.  No pneumothorax.  No interval osseous change.  IMPRESSION: Prominent cardiomediastinal contours, similar to prior. A pericardial effusion was noted on prior CT.  Retrocardiac opacity not excluded.   Original Report Authenticated By: Jearld Lesch, M.D.    Dg Chest Port 1 View  11/14/2012  *RADIOLOGY REPORT*  Clinical Data: Shortness of breath.  PORTABLE CHEST - 1 VIEW  Comparison: 11/10/2012  Findings: Very low lung volumes.  There is cardiomegaly with vascular congestion and bibasilar atelectasis.  Lung volumes have decreased.  No acute bony abnormality or visible effusions.  IMPRESSION: Very low lung volumes.  Cardiomegaly with vascular congestion and bibasilar atelectasis.   Original Report Authenticated By: Charlett Nose, M.D.    Dg Abd 2 Views  11/11/2012  *RADIOLOGY REPORT*  Clinical Data: Abdominal pain and vomiting.  ABDOMEN - 2 VIEW  Comparison: 01/15/2011 radiographs  Findings: Gas filled loops of colon and small bowel identified. No dilated loops of bowel are present. There is no evidence of pneumoperitoneum. No suspicious calcifications are noted. A severe thoracolumbar scoliosis is again identified.  IMPRESSION: Nonspecific nonobstructive bowel gas pattern - no evidence of pneumoperitoneum.   Original Report Authenticated By: Harmon Pier, M.D.    Dg Abd Acute W/chest  11/29/2012  *RADIOLOGY REPORT*  Clinical Data: Abdominal pain with nausea vomiting.  ACUTE ABDOMEN SERIES (ABDOMEN 2 VIEW & CHEST 1 VIEW)  Comparison: 11/24/2012 and prior radiographs  Findings: Upper limits normal heart size and low lung volumes again noted. Left lower lung atelectasis/consolidation again identified. There is no evidence of pneumothorax. An NG tube is present with tip overlying the proximal stomach.  Gas and stool is noted within the colon and rectum. Nondistended gas-filled loops of small bowel are present. There is no evidence of bowel obstruction or  pneumoperitoneum. No acute bony abnormalities are present. A severe thoracolumbar scoliosis is present.  IMPRESSION: Nonspecific nonobstructive bowel gas pattern - no definite evidence of bowel obstruction or pneumoperitoneum.  NG tube with tip overlying the proximal stomach.  Continued left lower lung atelectasis/consolidation.   Original Report Authenticated By:  Harmon Pier, M.D.    Dg Abd Acute W/chest  11/22/2012  *RADIOLOGY REPORT*  Clinical Data: Abdominal pain.  Possible GI bleed.  ACUTE ABDOMEN SERIES (ABDOMEN 2 VIEW & CHEST 1 VIEW)  Comparison: PA and lateral chest 11/10/2012.  Two views of the abdomen 11/17/2012. CT abdomen and pelvis 11/11/2012.  Findings: There is marked enlargement cardiopericardial silhouette. Note is made the patient had a large pericardial effusion on the comparison CT scan.  Left basilar atelectasis is noted.  Right lung is clear.  Two views the abdomen show no free intraperitoneal air.  There is gaseous distention of the colon throughout.  Severe convex right scoliosis is noted.  IMPRESSION:  1.  Enlarged cardiopericardial silhouette is stable in appearance. Note is made the patient pericardial effusion and on the comparison CT. 2.  Gaseous distention of the colon compatible with ileus. Negative for free air.   Original Report Authenticated By: Holley Dexter, M.D.    Dg Abd Portable 1v  11/15/2012  *RADIOLOGY REPORT*  Clinical Data: Evaluate for free air.  PORTABLE ABDOMEN - 1 VIEW  Comparison: Supine abdominal image 11/15/2012.  Findings: Decubitus view demonstrates continued mild diffuse gaseous distention of bowel.  No free air on decubitus view.  IMPRESSION: No free air.   Original Report Authenticated By: Charlett Nose, M.D.    Dg Abd Portable 1v  11/15/2012  *RADIOLOGY REPORT*  Clinical Data: Ileus  PORTABLE ABDOMEN - 1 VIEW  Comparison: 11/14/2012  Findings: Gas distended large and small bowel.  Findings may represent an ileus without significant change from prior  examination. Distal obstructing lesion not excluded.  Bowel walls well visualized which may be related to approximation of gas distended loops.  Free air cannot be excluded.  Decubitus view may be considered.  Nasogastric tube may be in place projecting to the right of midline.  Significant scoliosis.  IMPRESSION: Persistent gas distended bowel which may reflect ileus.  Distal obstructing lesion not excluded.  Free air not excluded and decubitus view recommended for further delineation.  Critical Value/emergent results were called by telephone at the time of interpretation on 11/15/2012 at 8:05 a.m. to [facility] the patient's nurse , who verbally acknowledged these results.   Original Report Authenticated By: Lacy Duverney, M.D.    Dg Abd Portable 1v  11/14/2012  *RADIOLOGY REPORT*  Clinical Data: Ileus  PORTABLE ABDOMEN - 1 VIEW  Comparison: CT abdomen pelvis dated 11/11/2012  Findings: Gaseous distention of small and large bowel, suggesting diffuse adynamic ileus.  Thoracolumbar scoliosis.  IMPRESSION: Suspected diffuse adynamic ileus.   Original Report Authenticated By: Charline Bills, M.D.    Dg Abd Portable 2v  11/17/2012  *RADIOLOGY REPORT*  Clinical Data: Ileus versus small bowel obstruction, follow-up  PORTABLE ABDOMEN - 2 VIEW  Comparison: Portable exam 12/07 hours compared to 11/16/2012  Findings: Nasogastric tube projects over stomach. Abdominal deformity due to the thoracolumbar scoliosis. Air filled nondistended loops of large and small bowel in abdomen. No definite bowel dilatation, bowel wall thickening, or free intraperitoneal air. Degenerative changes thoracolumbar spine. Left pelvic phleboliths.  IMPRESSION: Nonobstructive bowel gas pattern. No bowel dilatation currently identified.   Original Report Authenticated By: Ulyses Southward, M.D.     Microbiology: Recent Results (from the past 240 hour(s))  CULTURE, BLOOD (ROUTINE X 2)     Status: None   Collection Time    11/29/12  4:15 PM      Result  Value Range Status   Specimen Description BLOOD LEFT ARM   Final  Special Requests BOTTLES DRAWN AEROBIC AND ANAEROBIC 1CC EACH   Final   Culture  Setup Time 11/29/2012 20:32   Final   Culture     Final   Value:        BLOOD CULTURE RECEIVED NO GROWTH TO DATE CULTURE WILL BE HELD FOR 5 DAYS BEFORE ISSUING A FINAL NEGATIVE REPORT   Report Status PENDING   Incomplete  CULTURE, BLOOD (ROUTINE X 2)     Status: None   Collection Time    11/29/12  4:24 PM      Result Value Range Status   Specimen Description BLOOD LEFT ARM   Final   Special Requests BOTTLES DRAWN AEROBIC AND ANAEROBIC 1CC EACH   Final   Culture  Setup Time 11/29/2012 20:32   Final   Culture     Final   Value:        BLOOD CULTURE RECEIVED NO GROWTH TO DATE CULTURE WILL BE HELD FOR 5 DAYS BEFORE ISSUING A FINAL NEGATIVE REPORT   Report Status PENDING   Incomplete  MRSA PCR SCREENING     Status: None   Collection Time    11/29/12  4:37 PM      Result Value Range Status   MRSA by PCR NEGATIVE  NEGATIVE Final   Comment:            The GeneXpert MRSA Assay (FDA     approved for NASAL specimens     only), is one component of a     comprehensive MRSA colonization     surveillance program. It is not     intended to diagnose MRSA     infection nor to guide or     monitor treatment for     MRSA infections.  URINE CULTURE     Status: None   Collection Time    11/30/12  1:24 PM      Result Value Range Status   Specimen Description URINE, CATHETERIZED   Final   Special Requests NONE   Final   Culture  Setup Time 11/30/2012 23:00   Final   Colony Count NO GROWTH   Final   Culture NO GROWTH   Final   Report Status 12/01/2012 FINAL   Final     Labs: Basic Metabolic Panel:  Recent Labs Lab 11/29/12 1000 11/30/12 0434 12/01/12 0750 12/02/12 0447 12/03/12 0454  NA 136 140 140 139 141  K 3.6 3.6 3.6 3.4* 3.7  CL 101 108 108 108 109  CO2 18* 23 23 22 23   GLUCOSE 162* 108* 87 96 103*  BUN 22 20 10 9 8   CREATININE  1.71* 1.68* 1.45* 1.36* 1.38*  CALCIUM 8.7 8.1* 8.2* 8.2* 8.6   Liver Function Tests:  Recent Labs Lab 11/29/12 1000 11/30/12 0434  AST 12 9  ALT 9 5  ALKPHOS 52 37*  BILITOT 0.2* 0.2*  PROT 6.5 5.1*  ALBUMIN 3.1* 2.3*    Recent Labs Lab 11/29/12 1000  LIPASE 34   No results found for this basename: AMMONIA,  in the last 168 hours CBC:  Recent Labs Lab 11/29/12 1000  11/30/12 0434 11/30/12 1949 12/01/12 0750 12/02/12 0447 12/03/12 0454  WBC 11.2*  < > 6.0 6.9 6.3 5.8 5.4  NEUTROABS 9.9*  --   --   --   --   --   --   HGB 9.8*  < > 7.8* 7.7* 8.3* 8.2* 8.6*  HCT 30.8*  < > 24.6* 24.5* 25.9* 25.8*  26.9*  MCV 80.0  < > 79.4 80.3 79.9 78.9 78.7  PLT 527*  < > 358 361 342 360 369  < > = values in this interval not displayed. Cardiac Enzymes:  Recent Labs Lab 11/29/12 1000  TROPONINI <0.30   BNP: BNP (last 3 results)  Recent Labs  11/12/12 2000  PROBNP 818.8*   CBG: No results found for this basename: GLUCAP,  in the last 168 hours     Signed:  Shameer Molstad  [facility] 12/04/2012, 10:05 AM

## 2012-12-05 LAB — CULTURE, BLOOD (ROUTINE X 2): Culture: NO GROWTH

## 2012-12-07 ENCOUNTER — Non-Acute Institutional Stay (SKILLED_NURSING_FACILITY): Payer: Medicare Other | Admitting: Internal Medicine

## 2012-12-07 DIAGNOSIS — I150 Renovascular hypertension: Secondary | ICD-10-CM

## 2012-12-07 DIAGNOSIS — D62 Acute posthemorrhagic anemia: Secondary | ICD-10-CM

## 2012-12-07 DIAGNOSIS — J189 Pneumonia, unspecified organism: Secondary | ICD-10-CM

## 2012-12-07 DIAGNOSIS — K922 Gastrointestinal hemorrhage, unspecified: Secondary | ICD-10-CM

## 2012-12-11 LAB — FUNGUS CULTURE W SMEAR

## 2012-12-14 NOTE — Progress Notes (Signed)
Patient ID: [name], male   DOB: 06-13-1946, 67 y.o.   MRN: [id number]        HISTORY & PHYSICAL  DATE:  12/07/2012  FACILITY: [facility]   LEVEL OF CARE: SNF   ALLERGIES:  No Known Allergies   CHIEF COMPLAINT:  Manage upper GI bleed, acute blood loss anemia and pneumonia.    HISTORY OF PRESENT ILLNESS:  67 year-old, African-American male was hospitalized secondary to recurrent hematemesis.  EGD in February showed large hiatal hernia and mild gastritis.  EGD on 11/25/2012 showed ulcerative esophagitis and pathology showed benign focal inflamed mucosa.  GI recommended no further workup and continue with Protonix.  Hold aspirin and Plavix for two weeks.  Patient discharged back to this facility for long-term care management.  Staff do not report further hematemesis.    Acute blood loss anemia: The anemia has been stable. The patient denies fatigue, melena or hematochezia. No complications from the medications currently being used.  Hemoglobin was stable at 8.2.  Staff do not report any bleeding.    Pneumonia.  Patient received IV vancomycin and cefepime and then transitioned to Levaquin.  He is tolerating the Levaquin without any problems.  Staff do not report respiratory insufficiency or fever.     PAST MEDICAL HISTORY :  Past Medical History  Diagnosis Date  . Dementia   . Stroke   . Hydronephrosis   . Hypertension   . Hyperlipemia   . GERD (gastroesophageal reflux disease)   . BPH (benign prostatic hyperplasia)      PAST SURGICAL HISTORY: Past Surgical History  Procedure Laterality Date  . Esophagogastroduodenoscopy N/A 11/11/2012    Procedure: ESOPHAGOGASTRODUODENOSCOPY (EGD);  Surgeon: [name], MD;  Location: [facility] ENDOSCOPY;  Service: Endoscopy;  Laterality: N/A;  . Subxyphoid pericardial window N/A 11/13/2012    Procedure: SUBXYPHOID PERICARDIAL WINDOW;  Surgeon: Delight Ovens, MD;  Location: [facility] OR;  Service: Thoracic;  Laterality: N/A;  .  Esophagogastroduodenoscopy N/A 11/25/2012    Procedure: ESOPHAGOGASTRODUODENOSCOPY (EGD);  Surgeon: Vertell Novak., MD;  Location: [facility] ENDOSCOPY;  Service: Endoscopy;  Laterality: N/A;     SOCIAL HISTORY:  reports that he has never smoked. He has never used smokeless tobacco. He reports that he does not drink alcohol or use illicit drugs.   FAMILY HISTORY:  No family history on file.   CURRENT MEDICATIONS: Reviewed per [facility]  REVIEW OF SYSTEMS:  See HPI otherwise 14 point ROS is negative.   PHYSICAL EXAMINATION  VS:  T  97.4      P  74       RR  20      BP  130/84       POX% 95 room air       WT (Lb)  GENERAL: no acute distress, normal body habitus SKIN: warm & dry, no suspicious lesions or rashes, no excessive dryness EYES: conjunctivae normal, sclerae normal, normal eye lids MOUTH/THROAT: lips without lesions,no lesions in the mouth,tongue is without lesions,uvula elevates in midline NECK: supple, trachea midline, no neck masses, no thyroid tenderness, no thyromegaly LYMPHATICS: no LAN in the neck, no supraclavicular LAN RESPIRATORY: breathing is even & unlabored, BS CTAB CARDIAC: RRR, no murmur,no extra heart sounds, no edema GI:  ABDOMEN: abdomen soft, normal BS, no masses, no tenderness  LIVER/SPLEEN: no hepatomegaly, no splenomegaly MUSCULOSKELETAL: HEAD: normal to inspection & palpation BACK: no kyphosis, scoliosis or spinal processes tenderness EXTREMITIES: LEFT UPPER EXTREMITY: Strength intact, range of motion normal  RIGHT UPPER EXTREMITY:  Hemiparesis LEFT LOWER EXTREMITY:  Strength intact, range of motion moderate RIGHT LOWER EXTREMITY:  Weakness PSYCHIATRIC: the patient is alert & oriented to person, affect & behavior appropriate  LABS/RADIOLOGY:  Blood culture x2 showed no growth.    MRSA by PCR negative.    Urine culture showed no growth.    Glucose 103, creatinine 1.38, otherwise BMP normal.    Total protein 5.1, albumin 2.3, otherwise liver  profile normal.    Lipase 34.    Hemoglobin 8.6, MCV 78.7, otherwise CBC normal.    Troponin-I less than 0.03.     ASSESSMENT/PLAN:  Upper GI bleed (            ).  Likely secondary to ulcerative esophagitis.  Continue Protonix.  Hold aspirin and Plavix for two weeks as recommended.    Acute blood loss anemia (            ).  Reassess hemoglobin level.    Pneumonia 486.  Continue Levaquin as prescribed.    Renovascular hypertension (            ).  Well controlled.    History of CVA (            ).  Patient has right-sided hemiparesis and expressive aphasia.  Continue supportive care.   Chronic kidney disease stage III (            ).  Reassess renal functions.    Urinary retention (            ).  Continue Flomax.  Per urologist, a mass is unlikely.  He has reviewed the renal ultrasound.  Patient also had a prior CT scan which did not show a mass.  He is to follow up with the urologist as an outpatient.    Constipation 564.00.  Continue current laxatives.    V58.69.  Check CBC and BMP.    I have reviewed patient's medical records received from hospitalization.  CPT CODE: 54098.

## 2012-12-24 ENCOUNTER — Emergency Department (HOSPITAL_COMMUNITY)
Admission: EM | Admit: 2012-12-24 | Discharge: 2012-12-25 | Disposition: A | Discharge status: Skilled Nursing Facility | Payer: Medicare Other | Attending: Emergency Medicine | Admitting: Emergency Medicine

## 2012-12-24 ENCOUNTER — Encounter (HOSPITAL_COMMUNITY): Payer: Self-pay | Admitting: Emergency Medicine

## 2012-12-24 DIAGNOSIS — K219 Gastro-esophageal reflux disease without esophagitis: Secondary | ICD-10-CM | POA: Insufficient documentation

## 2012-12-24 DIAGNOSIS — E785 Hyperlipidemia, unspecified: Secondary | ICD-10-CM | POA: Insufficient documentation

## 2012-12-24 DIAGNOSIS — F0390 Unspecified dementia without behavioral disturbance: Secondary | ICD-10-CM | POA: Insufficient documentation

## 2012-12-24 DIAGNOSIS — N390 Urinary tract infection, site not specified: Secondary | ICD-10-CM | POA: Insufficient documentation

## 2012-12-24 DIAGNOSIS — I10 Essential (primary) hypertension: Secondary | ICD-10-CM | POA: Insufficient documentation

## 2012-12-24 DIAGNOSIS — Z8673 Personal history of transient ischemic attack (TIA), and cerebral infarction without residual deficits: Secondary | ICD-10-CM | POA: Insufficient documentation

## 2012-12-24 DIAGNOSIS — R369 Urethral discharge, unspecified: Secondary | ICD-10-CM | POA: Insufficient documentation

## 2012-12-24 DIAGNOSIS — R1110 Vomiting, unspecified: Secondary | ICD-10-CM | POA: Insufficient documentation

## 2012-12-24 DIAGNOSIS — Z79899 Other long term (current) drug therapy: Secondary | ICD-10-CM | POA: Insufficient documentation

## 2012-12-24 DIAGNOSIS — R748 Abnormal levels of other serum enzymes: Secondary | ICD-10-CM | POA: Insufficient documentation

## 2012-12-24 DIAGNOSIS — Z87448 Personal history of other diseases of urinary system: Secondary | ICD-10-CM | POA: Insufficient documentation

## 2012-12-24 DIAGNOSIS — N400 Enlarged prostate without lower urinary tract symptoms: Secondary | ICD-10-CM | POA: Insufficient documentation

## 2012-12-24 LAB — CBC WITH DIFFERENTIAL/PLATELET
Basophils Absolute: 0 10*3/uL (ref 0.0–0.1)
Eosinophils Absolute: 0.3 10*3/uL (ref 0.0–0.7)
Eosinophils Relative: 3 % (ref 0–5)
HCT: 26.6 % — ABNORMAL LOW (ref 39.0–52.0)
Lymphs Abs: 1 10*3/uL (ref 0.7–4.0)
MCH: 22.6 pg — ABNORMAL LOW (ref 26.0–34.0)
MCV: 72.3 fL — ABNORMAL LOW (ref 78.0–100.0)
Monocytes Absolute: 0.6 10*3/uL (ref 0.1–1.0)
Platelets: 306 10*3/uL (ref 150–400)
RDW: 15.7 % — ABNORMAL HIGH (ref 11.5–15.5)

## 2012-12-24 LAB — URINALYSIS, ROUTINE W REFLEX MICROSCOPIC
Bilirubin Urine: NEGATIVE
Nitrite: NEGATIVE
Specific Gravity, Urine: 1.014 (ref 1.005–1.030)
Urobilinogen, UA: 0.2 mg/dL (ref 0.0–1.0)
pH: 6 (ref 5.0–8.0)

## 2012-12-24 LAB — COMPREHENSIVE METABOLIC PANEL
ALT: 6 U/L (ref 0–53)
CO2: 26 mEq/L (ref 19–32)
Calcium: 9.1 mg/dL (ref 8.4–10.5)
Creatinine, Ser: 1.5 mg/dL — ABNORMAL HIGH (ref 0.50–1.35)
GFR calc Af Amer: 54 mL/min — ABNORMAL LOW (ref >90)
GFR calc non Af Amer: 46 mL/min — ABNORMAL LOW (ref >90)
Glucose, Bld: 105 mg/dL — ABNORMAL HIGH (ref 70–99)
Sodium: 137 mEq/L (ref 135–145)
Total Protein: 6.4 g/dL (ref 6.0–8.3)

## 2012-12-24 LAB — URINE MICROSCOPIC-ADD ON

## 2012-12-24 MED ORDER — SODIUM CHLORIDE 0.9 % IV BOLUS (SEPSIS)
500.0000 mL | Freq: Once | INTRAVENOUS | Status: AC
  Administered 2012-12-25: 500 mL via INTRAVENOUS

## 2012-12-24 MED ORDER — CEPHALEXIN 250 MG/5ML PO SUSR: 250.0000 mg | Freq: Four times a day (QID) | ORAL | Status: AC

## 2012-12-24 MED ORDER — DEXTROSE 5 % IV SOLN
1.0000 g | INTRAVENOUS | Status: DC
  Administered 2012-12-25: 1 g via INTRAVENOUS
  Filled 2012-12-24: qty 10

## 2012-12-24 NOTE — ED Notes (Signed)
[name] from IV team returned call and pt name, location and MRN provided.

## 2012-12-24 NOTE — ED Notes (Signed)
Attempted IV x1 without success. Assessed for additional stick - limited access. IV team called for start.

## 2012-12-24 NOTE — ED Provider Notes (Signed)
History     CSN: [id number]  Arrival date & time 12/24/12  2001   First MD Initiated Contact with Patient 12/24/12 2055      Chief Complaint  Patient presents with  . Abdominal Pain    (Consider location/radiation/quality/duration/timing/severity/associated sxs/prior treatment) Patient is a 67 y.o. male presenting with abdominal pain. The history is provided by the EMS personnel. No language interpreter was used.  Abdominal Pain Pt BIB [facility] from [facility].  Pt had 1 episode of vomiting after eating and staff noticed blood tinged urine to pt urinary catheter.  Staff also noticed pus like drainage from pt penis and area was tender.  Pt c/o LLQ abdominal pain to staff but denies pain while in ED. Denies diarrhea.  Pt is poor historian due to hx of dementia.  Pt states he feels fine and does not know why he is here.  Past Medical History  Diagnosis Date  . Dementia   . Stroke   . Hydronephrosis   . Hypertension   . Hyperlipemia   . GERD (gastroesophageal reflux disease)   . BPH (benign prostatic hyperplasia)     Past Surgical History  Procedure Laterality Date  . Esophagogastroduodenoscopy N/A 11/11/2012    Procedure: ESOPHAGOGASTRODUODENOSCOPY (EGD);  Surgeon: [name], MD;  Location: [facility] ENDOSCOPY;  Service: Endoscopy;  Laterality: N/A;  . Subxyphoid pericardial window N/A 11/13/2012    Procedure: SUBXYPHOID PERICARDIAL WINDOW;  Surgeon: Delight Ovens, MD;  Location: [facility] OR;  Service: Thoracic;  Laterality: N/A;  . Esophagogastroduodenoscopy N/A 11/25/2012    Procedure: ESOPHAGOGASTRODUODENOSCOPY (EGD);  Surgeon: Vertell Novak., MD;  Location: [facility] ENDOSCOPY;  Service: Endoscopy;  Laterality: N/A;    No family history on file.  History  Substance Use Topics  . Smoking status: Never Smoker   . Smokeless tobacco: Never Used  . Alcohol Use: No      Review of Systems  Unable to perform ROS: Dementia  Gastrointestinal: Positive for abdominal  pain.    Allergies  Review of patient's allergies indicates no known allergies.  Home Medications   Current Outpatient Rx  Name  Route  Sig  Dispense  Refill  . amLODipine (NORVASC) 5 MG tablet   Oral   Take 1 tablet (5 mg total) by mouth daily.   30 tablet   0   . atorvastatin (LIPITOR) 20 MG tablet   Oral   Take 1 tablet (20 mg total) by mouth daily at 6 PM.   30 tablet   0   . citalopram (CELEXA) 10 MG tablet   Oral   Take 5 mg by mouth daily.         Marland Kitchen docusate sodium (COLACE) 100 MG capsule   Oral   Take 100 mg by mouth 2 (two) times daily.         . hydrALAZINE (APRESOLINE) 50 MG tablet   Oral   Take 50 mg by mouth 3 (three) times daily.         . memantine (NAMENDA) 10 MG tablet   Oral   Take 10 mg by mouth 2 (two) times daily.         . metoCLOPramide (REGLAN) 5 MG tablet   Oral   Take 1 tablet (5 mg total) by mouth 4 (four) times daily -  before meals and at bedtime.         . metoprolol succinate (TOPROL-XL) 200 MG 24 hr tablet   Oral   Take 1  tablet (200 mg total) by mouth daily. Take with or immediately following a meal.   30 tablet   0   . omeprazole (PRILOSEC) 20 MG capsule   Oral   Take 1 capsule (20 mg total) by mouth 2 (two) times daily.         . polyethylene glycol (MIRALAX / GLYCOLAX) packet   Oral   Take 17 g by mouth daily.   14 each   0   . tamsulosin (FLOMAX) 0.4 MG CAPS   Oral   Take 0.4 mg by mouth daily.         . cephALEXin (KEFLEX) 250 MG/5ML suspension   Oral   Take 5 mLs (250 mg total) by mouth 4 (four) times daily.   100 mL   0     BP 113/53  Pulse 84  Temp(Src) 98.6 F (37 C) (Oral)  Resp 19  SpO2 99%  Physical Exam  Nursing note and vitals reviewed. Constitutional: He appears well-developed and well-nourished.  Pt lying comfortably on left side on exam bed. Indwelling catheter present.   HENT:  Head: Normocephalic and atraumatic.  Eyes: Conjunctivae are normal. No scleral icterus.   Neck: Normal range of motion.  Cardiovascular: Normal rate, regular rhythm and normal heart sounds.   Pulmonary/Chest: Effort normal and breath sounds normal. No respiratory distress. He has no wheezes. He has no rales. He exhibits no tenderness.  Abdominal: Soft. Bowel sounds are normal. He exhibits no distension and no mass. There is no tenderness. There is no rebound and no guarding.  Genitourinary: Penile tenderness ( scant amount of white-yellow drainage) present.  Musculoskeletal: Normal range of motion.  Neurological: He is alert.  Pt has dementia, poor historian.    Skin: Skin is warm and dry.    ED Course  Procedures (including critical care time)  Labs Reviewed  CBC WITH DIFFERENTIAL - Abnormal; Notable for the following:    RBC 3.68 (*)    Hemoglobin 8.3 (*)    HCT 26.6 (*)    MCV 72.3 (*)    MCH 22.6 (*)    RDW 15.7 (*)    Neutrophils Relative 78 (*)    All other components within normal limits  COMPREHENSIVE METABOLIC PANEL - Abnormal; Notable for the following:    Glucose, Bld 105 (*)    Creatinine, Ser 1.50 (*)    Albumin 3.1 (*)    Total Bilirubin 0.2 (*)    GFR calc non Af Amer 46 (*)    GFR calc Af Amer 54 (*)    All other components within normal limits  URINALYSIS, ROUTINE W REFLEX MICROSCOPIC - Abnormal; Notable for the following:    APPearance CLOUDY (*)    Hgb urine dipstick LARGE (*)    Protein, ur 100 (*)    Leukocytes, UA MODERATE (*)    All other components within normal limits  URINE MICROSCOPIC-ADD ON - Abnormal; Notable for the following:    Bacteria, UA MANY (*)    All other components within normal limits  URINE CULTURE   No results found.   1. UTI (lower urinary tract infection)   2. Elevated creatine kinase       MDM  Pt BIB EMS from [facility].  Pt had 1 episode vomiting.  Staff noticed blood tinged urine in foley and pus like discharge from penis.  Pt is poor historian due to dementia.   Will obtain labs.   Considering having foley changed.  Discussed  with Dr. Denton Lank.  Foley was placed on 3/18.  Does not believe foley needs to be changed at this time.    UA: indicative of UTI Pt has chronically worsening kidney function.  Tx: rocephin in ED with fluid bolus    Rx: Keflex for home tx.  Cr is slightly higher than previous at 1.5 , f/u with PCP in next few days.  Drink plenty of fluids.    Vitals: unremarkable. Discharged in stable condition.    Discussed pt with attending during ED encounter.     Junius Finner, PA-C 12/24/12 2349

## 2012-12-24 NOTE — ED Notes (Addendum)
Pt brought to ED via [facility] from [facility].  Pt began vomiting about an hour ago after eating, staff then noticed blood tinged urine to pt urinary catheter.  Upon attempt to complete catheter care staff noticed pus like drainage from pt penis and area to be tender.  Pt admits to LLQ abdominal pain.  Denies diarrhea.  Pt poor historian due to hx of dementia.

## 2012-12-25 NOTE — ED Notes (Addendum)
Call to [facility] to update re: tx while in ED and Discharge dx and meds. Spoke with Patsy Lager, LPN. Foley cath intact and emptied prior to transport.

## 2012-12-26 LAB — URINE CULTURE: Colony Count: >=100000

## 2012-12-27 LAB — AFB CULTURE WITH SMEAR (NOT AT ARMC): Acid Fast Smear: NONE SEEN

## 2012-12-29 NOTE — ED Provider Notes (Signed)
Medical screening examination/treatment/procedure(s) were conducted as a shared visit with non-physician practitioner(s) and myself.  I personally evaluated the patient during the encounter Pt w abd pain earlier, blood tinged urine. abd soft nt. Will check labs and ua/   Suzi Roots, MD 12/29/12 [phone]

## 2013-01-04 ENCOUNTER — Non-Acute Institutional Stay (SKILLED_NURSING_FACILITY): Payer: Medicare Other | Admitting: Internal Medicine

## 2013-01-04 DIAGNOSIS — N189 Chronic kidney disease, unspecified: Secondary | ICD-10-CM

## 2013-01-04 DIAGNOSIS — E785 Hyperlipidemia, unspecified: Secondary | ICD-10-CM

## 2013-01-04 DIAGNOSIS — I150 Renovascular hypertension: Secondary | ICD-10-CM

## 2013-01-04 DIAGNOSIS — F0150 Vascular dementia without behavioral disturbance: Secondary | ICD-10-CM

## 2013-01-14 NOTE — Progress Notes (Signed)
Patient ID: [name], male   DOB: January 01, 1946, 67 y.o.   MRN: [id number]        PROGRESS NOTE  DATE:  01/04/2013  FACILITY: [facility]   LEVEL OF CARE: SNF  Routine Visit  CHIEF COMPLAINT:  Manage hyperlipidemia and renovascular hypertension.    HISTORY OF PRESENT ILLNESS:  REASSESSMENT OF ONGOING PROBLEM(S):  HYPERLIPIDEMIA: No complications from the medications presently being used. Last fasting lipid panel showed :   In 07/2012, fasting lipid panel normal.    HTN: Pt 's HTN remains stable.  Denies CP, sob, DOE, pedal edema, headaches, dizziness or visual disturbances.  No complications from the medications currently being used.  Last BP :  160/90, 145/79, 100/56.    PAST MEDICAL HISTORY : Reviewed.  No changes.  CURRENT MEDICATIONS: Reviewed per [facility]  REVIEW OF SYSTEMS:  GENERAL: no change in appetite, no fatigue, no weight changes, no fever, chills or weakness RESPIRATORY: no cough, SOB, DOE, wheezing, hemoptysis CARDIAC: no chest pain, edema or palpitations GI: no abdominal pain, diarrhea, constipation, heart burn, nausea or vomiting  PHYSICAL EXAMINATION  VS:  T 98.3      P 76     RR  20    BP 160/90     POX %     WT (Lb) 187  GENERAL: no acute distress, normal body habitus NECK: supple, trachea midline, no neck masses, no thyroid tenderness, no thyromegaly RESPIRATORY: breathing is even & unlabored, BS CTAB CARDIAC: RRR, no murmur,no extra heart sounds, no edema GI: abdomen soft, normal BS, no masses, no tenderness, no hepatomegaly, no splenomegaly PSYCHIATRIC: the patient is alert & oriented to person, affect & behavior appropriate  LABS/RADIOLOGY: 11/2012:  Hemoglobin 11.4, MCV 83, platelets 388, WBC 6.7.    Glucose 127, creatinine 1.77, otherwise BMP normal.    07/2012:  Liver profile normal.  ASSESSMENT/PLAN:  Renovascular hypertension.  Blood pressure variable on maximal doses of blood pressure medications.   Therefore, we will review a log.     Hyperlipidemia.  Well controlled.   Dementia.  Continue current medications.   Chronic kidney disease.  Stable.   BPH.  Continue Flomax.   GERD.  Continue Prilosec.    CPT CODE: 14782

## 2013-01-28 ENCOUNTER — Non-Acute Institutional Stay (SKILLED_NURSING_FACILITY): Payer: Medicare Other | Admitting: Adult Health

## 2013-01-28 ENCOUNTER — Encounter: Payer: Self-pay | Admitting: Adult Health

## 2013-01-28 DIAGNOSIS — K5900 Constipation, unspecified: Secondary | ICD-10-CM

## 2013-01-28 DIAGNOSIS — K219 Gastro-esophageal reflux disease without esophagitis: Secondary | ICD-10-CM

## 2013-01-28 DIAGNOSIS — F3289 Other specified depressive episodes: Secondary | ICD-10-CM

## 2013-01-28 DIAGNOSIS — I10 Essential (primary) hypertension: Secondary | ICD-10-CM

## 2013-01-28 DIAGNOSIS — N400 Enlarged prostate without lower urinary tract symptoms: Secondary | ICD-10-CM

## 2013-01-28 DIAGNOSIS — F0390 Unspecified dementia without behavioral disturbance: Secondary | ICD-10-CM

## 2013-01-28 DIAGNOSIS — E785 Hyperlipidemia, unspecified: Secondary | ICD-10-CM

## 2013-01-28 DIAGNOSIS — F329 Major depressive disorder, single episode, unspecified: Secondary | ICD-10-CM

## 2013-01-28 MED ORDER — AMLODIPINE BESYLATE 5 MG PO TABS: 10.0000 mg | ORAL_TABLET | Freq: Every day | ORAL | Status: DC

## 2013-01-28 NOTE — Assessment & Plan Note (Signed)
Will continue effexor 75 mg daily

## 2013-01-28 NOTE — Assessment & Plan Note (Signed)
Will continue flomax 

## 2013-01-28 NOTE — Assessment & Plan Note (Signed)
Is stable will continue prilosec 20 mg twice daily and reglan 5 mg four times daily and will monitor

## 2013-01-28 NOTE — Assessment & Plan Note (Signed)
Will continue miralax daily will continue colace twice daily and will continue lactulose 30 cc twice daily

## 2013-01-28 NOTE — Assessment & Plan Note (Addendum)
His status is unchanged will continue namenda xr 28 mg daily will monitor his status

## 2013-01-28 NOTE — Assessment & Plan Note (Signed)
Is worse; will increase his norvasc to 10 mg daily will continue toprol xl 200 mg daily and apresoline 50 mg three times daily

## 2013-01-28 NOTE — Assessment & Plan Note (Signed)
Will continue lipitor 20 mg daily

## 2013-01-28 NOTE — Progress Notes (Signed)
Patient ID: [name], male   DOB: 1945-12-12, 67 y.o.   MRN: [id number]   FACILITY: [facility]   No Known Allergies    Chief Complaint  Patient presents with  . Medical Managment of Chronic Issues    HPI:   He is being seen for the management of his chronic illnesses. His blood pressure has been elevated; he will require adjustment of his norvasc dosing in order to better manage his blood pressure.  He remains stable there are no reports of concerns from the nursing staff.   Past Medical History  Diagnosis Date  . Dementia   . Stroke   . Hydronephrosis   . Hypertension   . Hyperlipemia   . GERD (gastroesophageal reflux disease)   . BPH (benign prostatic hyperplasia)     Past Surgical History  Procedure Laterality Date  . Esophagogastroduodenoscopy N/A 11/11/2012    Procedure: ESOPHAGOGASTRODUODENOSCOPY (EGD);  Surgeon: [name], MD;  Location: [facility] ENDOSCOPY;  Service: Endoscopy;  Laterality: N/A;  . Subxyphoid pericardial window N/A 11/13/2012    Procedure: SUBXYPHOID PERICARDIAL WINDOW;  Surgeon: Delight Ovens, MD;  Location: [facility] OR;  Service: Thoracic;  Laterality: N/A;  . Esophagogastroduodenoscopy N/A 11/25/2012    Procedure: ESOPHAGOGASTRODUODENOSCOPY (EGD);  Surgeon: Vertell Novak., MD;  Location: [facility] ENDOSCOPY;  Service: Endoscopy;  Laterality: N/A;    VITAL SIGNS BP 146/72  Pulse 93  Ht 5\' 7"  (1.702 m)  Wt 182 lb (82.555 kg)  BMI 28.5 kg/m2   Patient's Medications  New Prescriptions   No medications on file  Previous Medications   AMLODIPINE (NORVASC) 5 MG TABLET    Take 1 tablet (5 mg total) by mouth daily.   ATORVASTATIN (LIPITOR) 20 MG TABLET    Take 1 tablet (20 mg total) by mouth daily at 6 PM.   DOCUSATE SODIUM (COLACE) 100 MG CAPSULE    Take 100 mg by mouth 2 (two) times daily.   HYDRALAZINE (APRESOLINE) 50 MG TABLET    Take 50 mg by mouth 3 (three) times daily.   LACTULOSE (CHRONULAC) 10 GM/15ML SOLUTION    Take 30 mLs by  mouth 2 (two) times daily.   MEMANTINE HCL ER (NAMENDA XR) 28 MG CP24    Take 28 mg by mouth daily.   METOCLOPRAMIDE (REGLAN) 5 MG TABLET    Take 1 tablet (5 mg total) by mouth 4 (four) times daily -  before meals and at bedtime.   METOPROLOL SUCCINATE (TOPROL-XL) 200 MG 24 HR TABLET    Take 1 tablet (200 mg total) by mouth daily. Take with or immediately following a meal.   OMEPRAZOLE (PRILOSEC) 20 MG CAPSULE    Take 1 capsule (20 mg total) by mouth 2 (two) times daily.   POLYETHYLENE GLYCOL (MIRALAX / GLYCOLAX) PACKET    Take 17 g by mouth daily.   TAMSULOSIN (FLOMAX) 0.4 MG CAPS    Take 0.4 mg by mouth daily.   VENLAFAXINE (EFFEXOR) 75 MG TABLET    Take 75 mg by mouth daily.  Modified Medications   No medications on file  Discontinued Medications   CITALOPRAM (CELEXA) 10 MG TABLET    Take 5 mg by mouth daily.   MEMANTINE (NAMENDA) 10 MG TABLET    Take 10 mg by mouth 2 (two) times daily.    SIGNIFICANT DIAGNOSTIC EXAMS     Component Value Date/Time   ALBUMIN 3.1* 12/24/2012 2115   AST 14 12/24/2012 2115   ALT 6 12/24/2012 2115  ALKPHOS 61 12/24/2012 2115   BILITOT 0.2* 12/24/2012 2115       Component Value Date/Time   BUN 16 12/24/2012 2115   GLUCOSE 105* 12/24/2012 2115   CREATININE 1.50* 12/24/2012 2115   K 5.1 12/24/2012 2115   NA 137 12/24/2012 2115   TSH 1.022 11/12/2012 2000       Component Value Date/Time   WBC 8.7 12/24/2012 2115   RBC 3.68* 12/24/2012 2115   HGB 8.3* 12/24/2012 2115   HCT 26.6* 12/24/2012 2115   PLT 306 12/24/2012 2115   MCV 72.3* 12/24/2012 2115   11-24-12: wbc 6.7; hgb 11.4; hct 36.7; mcv 83 plt 388; glucose 127; bun 24; creat 1.77; k+ 4.9 Na++ 144   Review of Systems  Constitutional: Negative for malaise/fatigue.  Respiratory: Negative for cough and shortness of breath.   Cardiovascular: Negative for chest pain.  Gastrointestinal: Negative for heartburn, abdominal pain and constipation.  Musculoskeletal: Negative for myalgias and joint pain.   Neurological: Negative for headaches.  Psychiatric/Behavioral: Negative for depression. The patient does not have insomnia.      Physical Exam  Constitutional: He appears well-developed and well-nourished.  overweight  Neck: Neck supple.  Cardiovascular: Normal rate and regular rhythm.   Respiratory: Effort normal and breath sounds normal.  GI: Soft. Bowel sounds are normal.  Musculoskeletal: Normal range of motion. He exhibits no edema.  Neurological: He is alert.  Skin: Skin is warm and dry.  Psychiatric: He has a normal mood and affect.     ASSESSMENT/ PLAN:  Dementia His status is unchanged will continue namenda xr 28 mg daily will monitor his status   Hypertension Is worse; will increase his norvasc to 10 mg daily will continue toprol xl 200 mg daily and apresoline 50 mg three times daily   GERD (gastroesophageal reflux disease) Is stable will continue prilosec 20 mg twice daily and reglan 5 mg four times daily and will monitor  Constipation Will continue miralax daily will continue colace twice daily and will continue lactulose 30 cc twice daily   BPH (benign prostatic hyperplasia) Will continue flomax  Dyslipidemia Will continue lipitor 20 mg daily   Depression Will continue effexor 75 mg daily

## 2013-02-15 ENCOUNTER — Non-Acute Institutional Stay (SKILLED_NURSING_FACILITY): Payer: Medicare Other | Admitting: Internal Medicine

## 2013-02-15 DIAGNOSIS — D631 Anemia in chronic kidney disease: Secondary | ICD-10-CM

## 2013-02-15 DIAGNOSIS — R7309 Other abnormal glucose: Secondary | ICD-10-CM

## 2013-02-15 DIAGNOSIS — D473 Essential (hemorrhagic) thrombocythemia: Secondary | ICD-10-CM

## 2013-02-15 DIAGNOSIS — R739 Hyperglycemia, unspecified: Secondary | ICD-10-CM

## 2013-02-15 DIAGNOSIS — D7289 Other specified disorders of white blood cells: Secondary | ICD-10-CM

## 2013-02-22 ENCOUNTER — Non-Acute Institutional Stay (SKILLED_NURSING_FACILITY): Payer: Medicare Other | Admitting: Internal Medicine

## 2013-02-22 DIAGNOSIS — I150 Renovascular hypertension: Secondary | ICD-10-CM

## 2013-02-22 DIAGNOSIS — E7800 Pure hypercholesterolemia, unspecified: Secondary | ICD-10-CM

## 2013-02-22 DIAGNOSIS — F0390 Unspecified dementia without behavioral disturbance: Secondary | ICD-10-CM

## 2013-02-22 DIAGNOSIS — N189 Chronic kidney disease, unspecified: Secondary | ICD-10-CM

## 2013-02-25 DIAGNOSIS — N189 Chronic kidney disease, unspecified: Secondary | ICD-10-CM | POA: Insufficient documentation

## 2013-02-25 DIAGNOSIS — E7800 Pure hypercholesterolemia, unspecified: Secondary | ICD-10-CM | POA: Insufficient documentation

## 2013-02-25 DIAGNOSIS — I150 Renovascular hypertension: Secondary | ICD-10-CM | POA: Insufficient documentation

## 2013-02-25 NOTE — Progress Notes (Signed)
Patient ID: [name], male   DOB: 1946/07/06, 67 y.o.   MRN: [id number]        PROGRESS NOTE  DATE:  02/22/2013  FACILITY: [facility]   LEVEL OF CARE: SNF  Routine Visit  CHIEF COMPLAINT:  Manage hyperlipidemia, dementia and renovascular hypertension.    HISTORY OF PRESENT ILLNESS:  REASSESSMENT OF ONGOING PROBLEM(S):  HYPERLIPIDEMIA: No complications from the medications presently being used. Last fasting lipid panel showed :   In 07/2012, fasting lipid panel normal, in 5/14 fasting lipid panel normal.  HTN: Pt 's HTN remains stable.  Denies CP, sob, DOE, pedal edema, headaches, dizziness or visual disturbances.  No complications from the medications currently being used.  Last BP : 150/90,  160/90, 145/79, 100/56.    DEMENTIA: The dementia remaines stable and continues to function adequately in the current living environment with supervision.  The patient has had little changes in behavior. No complications noted from the medications presently being used.  PAST MEDICAL HISTORY : Reviewed.  No changes.  CURRENT MEDICATIONS: Reviewed per [facility]  REVIEW OF SYSTEMS:  GENERAL: no change in appetite, no fatigue, no weight changes, no fever, chills or weakness RESPIRATORY: no cough, SOB, DOE, wheezing, hemoptysis CARDIAC: no chest pain, edema or palpitations GI: no abdominal pain, diarrhea, constipation, heart burn, nausea or vomiting  PHYSICAL EXAMINATION  VS:  T 97.8.      P 80     RR 19    BP 150/90     POX %     WT (Lb) 182  GENERAL: no acute distress, normal body habitus EYES: Normal sclerae, normal conjunctivae, no discharge NECK: supple, trachea midline, no neck masses, no thyroid tenderness, no thyromegaly LYMPHATICS: No cervical lymphadenopathy, no supraclavicular lymphadenopathy RESPIRATORY: breathing is even & unlabored, BS CTAB CARDIAC: RRR, no murmur,no extra heart sounds, no edema GI: abdomen soft, normal BS, no masses, no tenderness, no hepatomegaly, no  splenomegaly PSYCHIATRIC: the patient is alert & oriented to person, affect & behavior appropriate  LABS/RADIOLOGY:  5/14 WBC 11, hemoglobin 8.8, MCV 68, platelets 385, glucose 122, BUN 28, creatinine 1.69, CO2 18 11/2012:  Hemoglobin 11.4, MCV 83, platelets 388, WBC 6.7.    Glucose 127, creatinine 1.77, otherwise BMP normal.    07/2012:  Liver profile normal.  ASSESSMENT/PLAN:  Renovascular hypertension- uncontrolled. Start clonidine 0.1 mg twice a day.    Hyperlipidemia.  Well controlled.   Dementia.  Continue current medications.   Chronic kidney disease.   Renal functions improved.  BPH.  Continue Flomax.   GERD.  Continue Prilosec.    Anemia of chronic kidney disease-hemoglobin declined. Iron was started. Recheck hemoglobin level.  Thrombocytosis-stable.  CPT CODE: 40981

## 2013-03-01 ENCOUNTER — Non-Acute Institutional Stay (SKILLED_NURSING_FACILITY): Payer: Medicare Other | Admitting: Internal Medicine

## 2013-03-01 DIAGNOSIS — E1165 Type 2 diabetes mellitus with hyperglycemia: Secondary | ICD-10-CM

## 2013-03-01 DIAGNOSIS — D473 Essential (hemorrhagic) thrombocythemia: Secondary | ICD-10-CM

## 2013-03-01 DIAGNOSIS — E1129 Type 2 diabetes mellitus with other diabetic kidney complication: Secondary | ICD-10-CM

## 2013-03-01 DIAGNOSIS — D509 Iron deficiency anemia, unspecified: Secondary | ICD-10-CM

## 2013-03-05 DIAGNOSIS — D631 Anemia in chronic kidney disease: Secondary | ICD-10-CM | POA: Insufficient documentation

## 2013-03-05 NOTE — Progress Notes (Signed)
Patient ID: [name], male   DOB: 1946/01/07, 67 y.o.   MRN: [id number]        PROGRESS NOTE  DATE: 02/15/2013  FACILITY:  [facility]  LEVEL OF CARE: SNF (31)  Acute Visit  CHIEF COMPLAINT:  Manage anemia of chronic kidney disease, chronic kidney disease stage III, and hyperglycemia.    HISTORY OF PRESENT ILLNESS: I was requested by the staff to assess the patient regarding above problem(s):  ANEMIA: His anemia is secondary to chronic kidney disease.  The anemia is unstable. The patient denies fatigue, melena or hematochezia. No complications from the medications currently being used.  On 02/12/2013:  Hemoglobin 8.8, MCV 68.  In 11/2012:  Hemoglobin 11.4, MCV 83.     CHRONIC KIDNEY DISEASE: The patient's chronic kidney disease remains stable.  Patient denies increasing lower extremity swelling or confusion. Last BUN and creatinine are:   On 02/12/2013:  BUN 28, creatinine 1.69.  In 11/2012:  BUN 24, creatinine 1.77.  HYPERGLYCEMIA:  On 02/12/2013, patient's blood glucose level was 122.  He does not have a history of diabetes mellitus and he is not on prednisone.   In 11/2012:  Glucose 127.  In 07/2012:  Glucose 93.    PAST MEDICAL HISTORY : Reviewed.  No changes.  CURRENT MEDICATIONS: Reviewed per [facility]  REVIEW OF SYSTEMS:  GENERAL: no change in appetite, no fatigue, no weight changes, no fever, chills or weakness RESPIRATORY: no cough, SOB, DOE,, wheezing, hemoptysis CARDIAC: no chest pain, edema or palpitations GI: no abdominal pain, diarrhea, constipation, heart burn, nausea or vomiting  PHYSICAL EXAMINATION  GENERAL: no acute distress, normal body habitus EYES: conjunctivae normal, sclerae normal, normal eye lids NECK: supple, trachea midline, no neck masses, no thyroid tenderness, no thyromegaly LYMPHATICS: no LAN in the neck, no supraclavicular LAN RESPIRATORY: breathing is even & unlabored, BS CTAB CARDIAC: RRR, no murmur,no extra heart sounds, no  edema GI: abdomen soft, normal BS, no masses, no tenderness, no hepatomegaly, no splenomegaly PSYCHIATRIC: the patient is alert & oriented to person, affect & behavior appropriate  LABS/RADIOLOGY: 02/12/2013:  WBC 11, platelet count 385.    11/2012:  WBC 6.7, platelet count 388.    ASSESSMENT/PLAN:  Anemia of chronic kidney disease.  Unstable problem.  Hemoglobin declined.  Patient also has developed microcytosis.  Check iron studies and also reassess hemoglobin level.    Chronic kidney disease stage III.  Renal functions improved.    Hyperglycemia.  New problem.  Check fasting glucose level and  hemoglobin A1C.    Thrombocytosis.  Platelet count stable.  Likely acute phase reactant.    Leukocytosis with mild left shift.  Absolute neutrophil count is 9.2.  Patient is asymptomatic.  Therefore, we will monitor at this point.     CPT CODE: 84132

## 2013-03-10 ENCOUNTER — Non-Acute Institutional Stay (SKILLED_NURSING_FACILITY): Payer: Medicare Other | Admitting: Internal Medicine

## 2013-03-10 DIAGNOSIS — D509 Iron deficiency anemia, unspecified: Secondary | ICD-10-CM

## 2013-03-10 DIAGNOSIS — D473 Essential (hemorrhagic) thrombocythemia: Secondary | ICD-10-CM

## 2013-03-22 DIAGNOSIS — D509 Iron deficiency anemia, unspecified: Secondary | ICD-10-CM | POA: Insufficient documentation

## 2013-03-22 DIAGNOSIS — D473 Essential (hemorrhagic) thrombocythemia: Secondary | ICD-10-CM | POA: Insufficient documentation

## 2013-03-22 DIAGNOSIS — E1129 Type 2 diabetes mellitus with other diabetic kidney complication: Secondary | ICD-10-CM | POA: Insufficient documentation

## 2013-03-22 NOTE — Progress Notes (Signed)
Patient ID: [name], male   DOB: [birth year], 67 y.o.   MRN: [id number]        PROGRESS NOTE  DATE: 03/01/2013  FACILITY:  [facility]  LEVEL OF CARE: SNF (31)  Acute Visit  CHIEF COMPLAINT:  Manage diabetes mellitus, iron deficiency anemia, and thrombocytosis.    HISTORY OF PRESENT ILLNESS: I was requested by the staff to assess the patient regarding above problem(s):  DM:  New problem.  Patient is not currently on any diabetic medications.  Pt denies polyuria, polydipsia, polyphagia, changes in vision or hypoglycemic episodes.  Last hemoglobin A1c is:  Patient's fasting glucose level is 127,  hemoglobin A1C 6.8.    ANEMIA: The patient has new onset iron deficiency anemia.  On 02/25/2013:  Hemoglobin 7.9, MCV 66.  On 02/18/2013:  Hemoglobin 8.5.  In 01/2013:  Hemoglobin 8.8, MCV 68.  On 02/25/2013:  TIBC 230, serum iron level 14, percent saturation 6, ferritin 41.  The patient denies fatigue, melena or hematochezia. The patient is currently not on iron.      PAST MEDICAL HISTORY : Reviewed.  No changes.  CURRENT MEDICATIONS: Reviewed per [facility]  REVIEW OF SYSTEMS:  GENERAL: no change in appetite, no fatigue, no weight changes, no fever, chills or weakness RESPIRATORY: no cough, SOB, DOE,, wheezing, hemoptysis CARDIAC: no chest pain, edema or palpitations GI: no abdominal pain, diarrhea, constipation, heart burn, nausea or vomiting  PHYSICAL EXAMINATION  GENERAL: no acute distress, normal body habitus EYES: conjunctivae normal, sclerae normal, normal eye lids NECK: supple, trachea midline, no neck masses, no thyroid tenderness, no thyromegaly LYMPHATICS: no LAN in the neck, no supraclavicular LAN RESPIRATORY: breathing is even & unlabored, BS CTAB CARDIAC: RRR, no murmur,no extra heart sounds, no edema GI: abdomen soft, normal BS, no masses, no tenderness, no hepatomegaly, no splenomegaly PSYCHIATRIC: the patient is alert & oriented to person, affect &  behavior appropriate  LABS/RADIOLOGY: 02/25/2013:  Platelet count 565.    01/2013:  Platelet count 385.  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  Significant problem.  Change diet to a diabetic diet.  Request dietitian to provide diabetic education.    Iron deficiency anemia.  New onset.  Significant problem.  Start ferrous sulfate 325 mg b.i.d.  Check hemoglobin level in one week.  Check stool guaiac x3.    Thrombocytosis.   Unstable problem.  Platelet count increased.  Likely acute phase reactant.  We will monitor.    CPT CODE: 11914

## 2013-04-07 ENCOUNTER — Non-Acute Institutional Stay (SKILLED_NURSING_FACILITY): Payer: Medicare Other | Admitting: Internal Medicine

## 2013-04-07 DIAGNOSIS — N189 Chronic kidney disease, unspecified: Secondary | ICD-10-CM

## 2013-04-07 DIAGNOSIS — F0390 Unspecified dementia without behavioral disturbance: Secondary | ICD-10-CM

## 2013-04-07 DIAGNOSIS — I150 Renovascular hypertension: Secondary | ICD-10-CM

## 2013-04-07 DIAGNOSIS — E7800 Pure hypercholesterolemia, unspecified: Secondary | ICD-10-CM

## 2013-04-07 NOTE — Progress Notes (Signed)
Patient ID: [name], male   DOB: [birth year], 67 y.o.   MRN: [id number]        PROGRESS NOTE  DATE: 03/10/2013  FACILITY:  [facility]  LEVEL OF CARE: SNF (31)  Acute Visit  CHIEF COMPLAINT:  Manage anemia and thrombocytosis.    HISTORY OF PRESENT ILLNESS: I was requested by the staff to assess the patient regarding above problem(s):  ANEMIA: The anemia has been stable. The patient denies fatigue, melena or hematochezia. No complications from the medications currently being used.   The patient's anemia is secondary to iron deficiency.  On 03/08/2013:  Hemoglobin 8, MCV 66.  On 0612/2014:  Hemoglobin 7.9.  The patient was started on iron.    THROMBOCYTOSIS:  On 03/08/2013:  Platelet count 433.  On 02/25/2013:  Platelet count 565.  He denies numbness, tingling, or headaches.    PAST MEDICAL HISTORY : Reviewed.  No changes.  CURRENT MEDICATIONS: Reviewed per [facility]  REVIEW OF SYSTEMS:  GENERAL: no change in appetite, no fatigue, no weight changes, no fever, chills or weakness RESPIRATORY: no cough, SOB, DOE,, wheezing, hemoptysis CARDIAC: no chest pain, edema or palpitations GI: no abdominal pain, diarrhea, constipation, heart burn, nausea or vomiting  PHYSICAL EXAMINATION  GENERAL: no acute distress, moderately obese body habitus NECK: supple, trachea midline, no neck masses, no thyroid tenderness, no thyromegaly RESPIRATORY: breathing is even & unlabored, BS CTAB CARDIAC: RRR, no murmur,no extra heart sounds, no edema GI: abdomen soft, normal BS, no masses, no tenderness, no hepatomegaly, no splenomegaly PSYCHIATRIC: the patient is alert & oriented to person, affect & behavior appropriate  ASSESSMENT/PLAN:  Iron deficiency anemia.  Hemoglobin stable.  Request results of stool guaiac.  Continue iron.   Thrombocytosis.  Platelet count improved.  Acute phase reactant.    CPT CODE: 40981

## 2013-04-07 NOTE — Progress Notes (Signed)
Patient ID: KASSEM KIBBE, male   DOB: 03/07/1946, 67 y.o.   MRN: [id number]        PROGRESS NOTE  DATE:  04/07/2013  FACILITY: [facility]   LEVEL OF CARE: SNF  Routine Visit  CHIEF COMPLAINT:  Manage hyperlipidemia, dementia and renovascular hypertension.    HISTORY OF PRESENT ILLNESS:  REASSESSMENT OF ONGOING PROBLEM(S):  HYPERLIPIDEMIA: No complications from the medications presently being used. Last fasting lipid panel showed :   In 07/2012, fasting lipid panel normal, in 5/14 fasting lipid panel normal.  HTN: Pt 's HTN remains stable.  Denies CP, sob, DOE, pedal edema, headaches, dizziness or visual disturbances.  No complications from the medications currently being used.  Last BP : 150/90,  160/90, 145/79, 100/56, 150/84    DEMENTIA: The dementia remaines stable and continues to function adequately in the current living environment with supervision.  The patient has had little changes in behavior. No complications noted from the medications presently being used.  PAST MEDICAL HISTORY : Reviewed.  No changes.  CURRENT MEDICATIONS: Reviewed per [facility]  REVIEW OF SYSTEMS:  GENERAL: no change in appetite, no fatigue, no weight changes, no fever, chills or weakness RESPIRATORY: no cough, SOB, DOE, wheezing, hemoptysis CARDIAC: no chest pain, edema or palpitations GI: no abdominal pain, diarrhea, constipation, heart burn, nausea or vomiting  PHYSICAL EXAMINATION  VS:  T 97.5.      P 80     RR 20    BP 150/84    POX %     WT (Lb) 187  GENERAL: no acute distress, normal body habitus NECK: supple, trachea midline, no neck masses, no thyroid tenderness, no thyromegaly RESPIRATORY: breathing is even & unlabored, BS CTAB CARDIAC: RRR, no murmur,no extra heart sounds, no edema GI: abdomen soft, normal BS, no masses, no tenderness, no hepatomegaly, no splenomegaly PSYCHIATRIC: the patient is alert & oriented to person, affect & behavior appropriate  LABS/RADIOLOGY: 6/14  hemoglobin 8, MCV 66, platelets 423, WBC 6 point 8, TIBC 2:30, serum iron 14% saturation 6, ferritin 41, hemoglobin A1c 6.8   5/14 WBC 11, hemoglobin 8.8, MCV 68, platelets 385, glucose 122, BUN 28, creatinine 1.69, CO2 18 11/2012:  Hemoglobin 11.4, MCV 83, platelets 388, WBC 6.7.    Glucose 127, creatinine 1.77, otherwise BMP normal.    07/2012:  Liver profile normal.  ASSESSMENT/PLAN:  Renovascular hypertension- uncontrolled.  clonidine was started.   Hyperlipidemia.  Well controlled.   Dementia.  Continue current medications.   Chronic kidney disease.   Renal functions improved.  BPH.  Continue Flomax.   GERD.  Continue Prilosec.    Anemia of chronic kidney disease-stable.  Thrombocytosis-stable.  CPT CODE: 78295

## 2013-05-03 ENCOUNTER — Non-Acute Institutional Stay (SKILLED_NURSING_FACILITY): Payer: Medicare Other | Admitting: Internal Medicine

## 2013-05-03 DIAGNOSIS — I150 Renovascular hypertension: Secondary | ICD-10-CM

## 2013-05-03 DIAGNOSIS — F0390 Unspecified dementia without behavioral disturbance: Secondary | ICD-10-CM

## 2013-05-03 DIAGNOSIS — E7800 Pure hypercholesterolemia, unspecified: Secondary | ICD-10-CM

## 2013-05-03 DIAGNOSIS — N189 Chronic kidney disease, unspecified: Secondary | ICD-10-CM

## 2013-05-08 NOTE — Progress Notes (Signed)
Patient ID: [name], male   DOB: [birth year], 67 y.o.   MRN: [id number]        PROGRESS NOTE  DATE:  05/03/2013  FACILITY: [facility]   LEVEL OF CARE: SNF  Routine Visit  CHIEF COMPLAINT:  Manage hyperlipidemia, dementia and renovascular hypertension.    HISTORY OF PRESENT ILLNESS:  REASSESSMENT OF ONGOING PROBLEM(S):  HYPERLIPIDEMIA: No complications from the medications presently being used. Last fasting lipid panel showed :   In 07/2012, fasting lipid panel normal, in 5/14 fasting lipid panel normal.  HTN: Pt 's HTN remains stable.  Denies CP, sob, DOE, pedal edema, headaches, dizziness or visual disturbances.  No complications from the medications currently being used.  Last BP : 150/90,  160/90, 145/79, 100/56, 150/84,158/88, 150/84  DEMENTIA: The dementia remaines stable and continues to function adequately in the current living environment with supervision.  The patient has had little changes in behavior. No complications noted from the medications presently being used.  PAST MEDICAL HISTORY : Reviewed.  No changes.  CURRENT MEDICATIONS: Reviewed per [facility]  REVIEW OF SYSTEMS:  GENERAL: no change in appetite, no fatigue, no weight changes, no fever, chills or weakness RESPIRATORY: no cough, SOB, DOE, wheezing, hemoptysis CARDIAC: no chest pain, edema or palpitations GI: no abdominal pain, diarrhea, constipation, heart burn, nausea or vomiting  PHYSICAL EXAMINATION  VS:  T 98.3.      P 80     RR 17    BP 158/88    POX %     WT (Lb) 184  GENERAL: no acute distress, normal body habitus EYES: normal sclerae, normal conjunctivae, no discharge NECK: supple, trachea midline, no neck masses, no thyroid tenderness, no thyromegaly LYMPHATICS: no cervical LAN, no supraclavicular LAN RESPIRATORY: breathing is even & unlabored, BS CTAB CARDIAC: RRR, no murmur,no extra heart sounds, no edema GI: abdomen soft, normal BS, no masses, no tenderness, no hepatomegaly, no  splenomegaly PSYCHIATRIC: the patient is alert & oriented to person, affect & behavior appropriate  LABS/RADIOLOGY:  7/14 Hb 9, mcv 66 ow cbc nl 6/14 hemoglobin 8, MCV 66, platelets 423, WBC 6 point 8, TIBC 2:30, serum iron 14% saturation 6, ferritin 41, hemoglobin A1c 6.8   5/14 WBC 11, hemoglobin 8.8, MCV 68, platelets 385, glucose 122, BUN 28, creatinine 1.69, CO2 18 11/2012:  Hemoglobin 11.4, MCV 83, platelets 388, WBC 6.7.    Glucose 127, creatinine 1.77, otherwise BMP normal.    07/2012:  Liver profile normal.  ASSESSMENT/PLAN:  Renovascular hypertension- uncontrolled.  Increase clonidine tp 0.2 mg bid.   Hyperlipidemia.  Well controlled.   Dementia.  Continue current medications.   Chronic kidney disease.   Renal functions improved.  BPH.  Continue Flomax.   GERD.  Continue Prilosec.    Anemia of chronic kidney disease-stable.  Thrombocytosis-stable.  Check liver profile  CPT CODE: [id number]

## 2013-06-07 ENCOUNTER — Non-Acute Institutional Stay (SKILLED_NURSING_FACILITY): Payer: Medicare Other | Admitting: Internal Medicine

## 2013-06-07 DIAGNOSIS — I150 Renovascular hypertension: Secondary | ICD-10-CM

## 2013-06-07 DIAGNOSIS — E7800 Pure hypercholesterolemia, unspecified: Secondary | ICD-10-CM

## 2013-06-07 DIAGNOSIS — F0390 Unspecified dementia without behavioral disturbance: Secondary | ICD-10-CM

## 2013-06-08 NOTE — Progress Notes (Signed)
Patient ID: [name], male   DOB: 1946-02-17, 67 y.o.   MRN: [id number]        PROGRESS NOTE  DATE:  06/07/2013  FACILITY: [facility]   LEVEL OF CARE: SNF  Routine Visit  CHIEF COMPLAINT:  Manage hyperlipidemia, dementia and renovascular hypertension.    HISTORY OF PRESENT ILLNESS:  REASSESSMENT OF ONGOING PROBLEM(S):  HYPERLIPIDEMIA: No complications from the medications presently being used. Last fasting lipid panel showed :   In 07/2012, fasting lipid panel normal, in 5/14 fasting lipid panel normal.  HTN: Pt 's HTN remains stable.  Denies CP, sob, DOE, pedal edema, headaches, dizziness or visual disturbances.  No complications from the medications currently being used.  Last BP : 150/90,  160/90, 145/79, 100/56, 150/84,158/88, 150/84, 113/85  DEMENTIA: The dementia remaines stable and continues to function adequately in the current living environment with supervision.  The patient has had little changes in behavior. No complications noted from the medications presently being used.  PAST MEDICAL HISTORY : Reviewed.  No changes.  CURRENT MEDICATIONS: Reviewed per [facility]  REVIEW OF SYSTEMS:  GENERAL: no change in appetite, no fatigue, no weight changes, no fever, chills or weakness RESPIRATORY: no cough, SOB, DOE, wheezing, hemoptysis CARDIAC: no chest pain, edema or palpitations GI: no abdominal pain, diarrhea, constipation, heart burn, nausea or vomiting  PHYSICAL EXAMINATION  VS:  T 97.3.      P 82     RR 18    BP 113/85    POX %     WT (Lb) 190  GENERAL: no acute distress, normal body habitus NECK: supple, trachea midline, no neck masses, no thyroid tenderness, no thyromegaly RESPIRATORY: breathing is even & unlabored, BS CTAB CARDIAC: RRR, no murmur,no extra heart sounds, no edema GI: abdomen soft, normal BS, no masses, no tenderness, no hepatomegaly, no splenomegaly PSYCHIATRIC: the patient is alert & oriented to person, affect & behavior  appropriate  LABS/RADIOLOGY:  8-14 liver profile normal  7/14 Hb 9, mcv 66 ow cbc nl 6/14 hemoglobin 8, MCV 66, platelets 423, WBC 6 point 8, TIBC 2:30, serum iron 14% saturation 6, ferritin 41, hemoglobin A1c 6.8   5/14 WBC 11, hemoglobin 8.8, MCV 68, platelets 385, glucose 122, BUN 28, creatinine 1.69, CO2 18 11/2012:  Hemoglobin 11.4, MCV 83, platelets 388, WBC 6.7.    Glucose 127, creatinine 1.77, otherwise BMP normal.    07/2012:  Liver profile normal.  ASSESSMENT/PLAN:  Renovascular hypertension- improved.  Hyperlipidemia.  Well controlled.   Dementia.  Continue current medications.   Chronic kidney disease.   Renal functions improved.  BPH.  Continue Flomax.   GERD.  Continue Prilosec.    Anemia of chronic kidney disease-stable.  Thrombocytosis-stable.  CPT CODE: 40981

## 2013-07-13 ENCOUNTER — Non-Acute Institutional Stay (SKILLED_NURSING_FACILITY): Payer: Medicare Other | Admitting: Internal Medicine

## 2013-07-13 DIAGNOSIS — E7800 Pure hypercholesterolemia, unspecified: Secondary | ICD-10-CM

## 2013-07-13 DIAGNOSIS — F0390 Unspecified dementia without behavioral disturbance: Secondary | ICD-10-CM

## 2013-07-13 DIAGNOSIS — I150 Renovascular hypertension: Secondary | ICD-10-CM

## 2013-07-16 NOTE — Progress Notes (Signed)
Patient ID: [name], male   DOB: 1946/01/31, 67 y.o.   MRN: [id number]        PROGRESS NOTE  DATE:  07/13/2013  FACILITY: [facility]   LEVEL OF CARE: SNF  Routine Visit  CHIEF COMPLAINT:  Manage hyperlipidemia, dementia and renovascular hypertension.    HISTORY OF PRESENT ILLNESS:  REASSESSMENT OF ONGOING PROBLEM(S):  HYPERLIPIDEMIA: No complications from the medications presently being used. Last fasting lipid panel showed :   In 07/2012, fasting lipid panel normal, in 5/14 fasting lipid panel normal.  HTN: Pt 's HTN remains stable.  Denies CP, sob, DOE, pedal edema, headaches, dizziness or visual disturbances.  No complications from the medications currently being used.  Last BP : 150/90,  160/90, 145/79, 100/56, 150/84,158/88, 150/84, 113/85, 130/70.  DEMENTIA: The dementia remaines stable and continues to function adequately in the current living environment with supervision.  The patient has had little changes in behavior. No complications noted from the medications presently being used.  PAST MEDICAL HISTORY : Reviewed.  No changes.  CURRENT MEDICATIONS: Reviewed per [facility]  REVIEW OF SYSTEMS:  GENERAL: no change in appetite, no fatigue, no weight changes, no fever, chills or weakness RESPIRATORY: no cough, SOB, DOE, wheezing, hemoptysis CARDIAC: no chest pain, edema or palpitations GI: no abdominal pain, diarrhea, constipation, heart burn, nausea or vomiting  PHYSICAL EXAMINATION  VS:  T 98.1      P 80     RR 18    BP 130/70    POX %     WT (Lb) 190  GENERAL: no acute distress, normal body habitus NECK: supple, trachea midline, no neck masses, no thyroid tenderness, no thyromegaly RESPIRATORY: breathing is even & unlabored, BS CTAB CARDIAC: RRR, no murmur,no extra heart sounds, no edema GI: abdomen soft, normal BS, no masses, no tenderness, no hepatomegaly, no splenomegaly PSYCHIATRIC: the patient is alert & oriented to person, affect & behavior  appropriate  LABS/RADIOLOGY:  8-14 liver profile normal  7/14 Hb 9, mcv 66 ow cbc nl 6/14 hemoglobin 8, MCV 66, platelets 423, WBC 6 point 8, TIBC 2:30, serum iron 14% saturation 6, ferritin 41, hemoglobin A1c 6.8   5/14 WBC 11, hemoglobin 8.8, MCV 68, platelets 385, glucose 122, BUN 28, creatinine 1.69, CO2 18 11/2012:  Hemoglobin 11.4, MCV 83, platelets 388, WBC 6.7.    Glucose 127, creatinine 1.77, otherwise BMP normal.    07/2012:  Liver profile normal.  ASSESSMENT/PLAN:  Renovascular hypertension- improved.  Hyperlipidemia.  Well controlled.   Dementia.  Continue current medications.   Chronic kidney disease.   Renal functions improved.  BPH.  Continue Flomax.   GERD.  Continue Prilosec.    Anemia of chronic kidney disease-stable.  Thrombocytosis-stable.  CPT CODE: 78295

## 2013-08-03 ENCOUNTER — Encounter: Payer: Self-pay | Admitting: Internal Medicine

## 2013-08-03 ENCOUNTER — Non-Acute Institutional Stay (SKILLED_NURSING_FACILITY): Payer: Medicare Other | Admitting: Internal Medicine

## 2013-08-03 DIAGNOSIS — F0390 Unspecified dementia without behavioral disturbance: Secondary | ICD-10-CM

## 2013-08-03 DIAGNOSIS — I150 Renovascular hypertension: Secondary | ICD-10-CM

## 2013-08-03 DIAGNOSIS — E7800 Pure hypercholesterolemia, unspecified: Secondary | ICD-10-CM

## 2013-08-03 NOTE — Progress Notes (Signed)
Patient ID: [name], male   DOB: 09/16/1946, 67 y.o.   MRN: [id number]        PROGRESS NOTE  DATE:  08/03/2013  [facility]AC[facility]   LEVEL OF CARE: SNF  Routine Visit  CHIEF COMPLAINT:  Manage hyperlipidemia, dementia and renovascular hypertension.    HISTORY OF PRESENT ILLNESS:  REASSESSMENT OF ONGOING PROBLEM(S):  HYPERLIPIDEMIA: No complications from the medications presently being used. Last fasting lipid panel showed :   In 07/2012, fasting lipid panel normal, in 5/14 fasting lipid panel normal.  HTN: Pt 's HTN remains stable.  Denies CP, sob, DOE, pedal edema, headaches, dizziness or visual disturbances.  No complications from the medications currently being used.  Last BP : 150/90,  160/90, 145/79, 100/56, 150/84,158/88, 150/84, 113/85, 130/70, 130/72.  DEMENTIA: The dementia remaines stable and continues to function adequately in the current living environment with supervision.  The patient has had little changes in behavior. No complications noted from the medications presently being used.  PAST MEDICAL HISTORY : Reviewed.  No changes.  CURRENT MEDICATIONS: Reviewed per [facility]  REVIEW OF SYSTEMS:  GENERAL: no change in appetite, no fatigue, no weight changes, no fever, chills or weakness RESPIRATORY: no cough, SOB, DOE, wheezing, hemoptysis CARDIAC: no chest pain, edema or palpitations GI: no abdominal pain, diarrhea, constipation, heart burn, nausea or vomiting  PHYSICAL EXAMINATION  VS:  T 98.2      P 72     RR 18    BP 130/72    POX %     WT (Lb) 190  GENERAL: no acute distress, normal body habitus NECK: supple, trachea midline, no neck masses, no thyroid tenderness, no thyromegaly RESPIRATORY: breathing is even & unlabored, BS CTAB CARDIAC: RRR, no murmur,no extra heart sounds, no edema GI: abdomen soft, normal BS, no masses, no tenderness, no hepatomegaly, no splenomegaly PSYCHIATRIC: the patient is alert & oriented to person, affect & behavior  appropriate  LABS/RADIOLOGY:  8-14 liver profile normal  7/14 Hb 9, mcv 66 ow cbc nl 6/14 hemoglobin 8, MCV 66, platelets 423, WBC 6 point 8, TIBC 2:30, serum iron 14% saturation 6, ferritin 41, hemoglobin A1c 6.8   5/14 WBC 11, hemoglobin 8.8, MCV 68, platelets 385, glucose 122, BUN 28, creatinine 1.69, CO2 18 11/2012:  Hemoglobin 11.4, MCV 83, platelets 388, WBC 6.7.    Glucose 127, creatinine 1.77, otherwise BMP normal.    07/2012:  Liver profile normal.  ASSESSMENT/PLAN:  Renovascular hypertension- improved.  Hyperlipidemia.  Well controlled. Check fasting lipid panel  Dementia.  Continue current medications.   Chronic kidney disease.   Renal functions improved.  BPH.  Continue Flomax.   GERD.  Continue Prilosec.    Anemia of chronic kidney disease-stable.  Thrombocytosis-stable.  CPT CODE: 46962

## 2013-08-10 ENCOUNTER — Non-Acute Institutional Stay (SKILLED_NURSING_FACILITY): Payer: Medicare Other | Admitting: Internal Medicine

## 2013-08-10 DIAGNOSIS — D631 Anemia in chronic kidney disease: Secondary | ICD-10-CM

## 2013-08-31 ENCOUNTER — Non-Acute Institutional Stay (SKILLED_NURSING_FACILITY): Payer: Medicare Other | Admitting: Internal Medicine

## 2013-08-31 ENCOUNTER — Encounter: Payer: Self-pay | Admitting: Internal Medicine

## 2013-08-31 DIAGNOSIS — I150 Renovascular hypertension: Secondary | ICD-10-CM

## 2013-08-31 DIAGNOSIS — E7800 Pure hypercholesterolemia, unspecified: Secondary | ICD-10-CM

## 2013-08-31 DIAGNOSIS — F0390 Unspecified dementia without behavioral disturbance: Secondary | ICD-10-CM

## 2013-08-31 NOTE — Progress Notes (Signed)
Patient ID: [name], male   DOB: 06-Jul-1946, 67 y.o.   MRN: [id number]        PROGRESS NOTE  DATE:  08/31/2013  FACILITY: [facility]   LEVEL OF CARE: SNF  Routine Visit  CHIEF COMPLAINT:  Manage hyperlipidemia, dementia and renovascular hypertension.    HISTORY OF PRESENT ILLNESS:  REASSESSMENT OF ONGOING PROBLEM(S):  HYPERLIPIDEMIA: No complications from the medications presently being used. Last fasting lipid panel showed :   In 07/2012, fasting lipid panel normal, in 5/14 fasting lipid panel normal, in 11-14 HDL 38 otherwise fasting lipid panel normal.  HTN: Pt 's HTN remains stable.  Denies CP, sob, DOE, pedal edema, headaches, dizziness or visual disturbances.  No complications from the medications currently being used.  Last BP : 150/90,  160/90, 145/79, 100/56, 150/84,158/88, 150/84, 113/85, 130/70, 130/72, 132/66.  DEMENTIA: The dementia remaines stable and continues to function adequately in the current living environment with supervision.  The patient has had little changes in behavior. No complications noted from the medications presently being used.  PAST MEDICAL HISTORY : Reviewed.  No changes.  CURRENT MEDICATIONS: Reviewed per [facility]  REVIEW OF SYSTEMS:  GENERAL: no change in appetite, no fatigue, no weight changes, no fever, chills or weakness RESPIRATORY: no cough, SOB, DOE, wheezing, hemoptysis CARDIAC: no chest pain, edema or palpitations GI: no abdominal pain, diarrhea, constipation, heart burn, nausea or vomiting  PHYSICAL EXAMINATION  VS:  T 97.9      P 88     RR 15    BP 132/66    POX %     WT (Lb) 195  GENERAL: no acute distress, normal body habitus NECK: supple, trachea midline, no neck masses, no thyroid tenderness, no thyromegaly RESPIRATORY: breathing is even & unlabored, BS CTAB CARDIAC: RRR, no murmur,no extra heart sounds, no edema GI: abdomen soft, normal BS, no masses, no tenderness, no hepatomegaly, no splenomegaly PSYCHIATRIC: the  patient is alert & oriented to person, affect & behavior appropriate  LABS/RADIOLOGY: 11-14 hemoglobin 12.5, MCV 84 otherwise CBC normal, glucose 105, creatinine 1.42, total protein 5.8, albumin 3.5 otherwise CMP normal  8-14 liver profile normal  7/14 Hb 9, mcv 66 ow cbc nl 6/14 hemoglobin 8, MCV 66, platelets 423, WBC 6 point 8, TIBC 2:30, serum iron 14% saturation 6, ferritin 41, hemoglobin A1c 6.8   5/14 WBC 11, hemoglobin 8.8, MCV 68, platelets 385, glucose 122, BUN 28, creatinine 1.69, CO2 18 11/2012:  Hemoglobin 11.4, MCV 83, platelets 388, WBC 6.7.    Glucose 127, creatinine 1.77, otherwise BMP normal.    07/2012:  Liver profile normal.  ASSESSMENT/PLAN:  Renovascular hypertension- improved.  Hyperlipidemia.  Well controlled.   Dementia.  Continue current medications.   Chronic kidney disease.   Renal functions improved.  BPH.  Continue Flomax.   GERD.  Continue Prilosec.    Anemia of chronic kidney disease-stable.  Thrombocytosis-stable.  CPT CODE: 62130

## 2013-09-21 ENCOUNTER — Non-Acute Institutional Stay (SKILLED_NURSING_FACILITY): Payer: Medicare Other | Admitting: Internal Medicine

## 2013-09-21 DIAGNOSIS — N183 Chronic kidney disease, stage 3 unspecified: Secondary | ICD-10-CM

## 2013-09-21 DIAGNOSIS — I150 Renovascular hypertension: Secondary | ICD-10-CM

## 2013-09-21 DIAGNOSIS — E785 Hyperlipidemia, unspecified: Secondary | ICD-10-CM

## 2013-09-21 DIAGNOSIS — F0390 Unspecified dementia without behavioral disturbance: Secondary | ICD-10-CM

## 2013-09-22 NOTE — Progress Notes (Signed)
Patient ID: [name], male   DOB: [birth year], 68 y.o.   MRN: [id number]        PROGRESS NOTE  DATE:  09-21-13  FACILITY: [facility]   LEVEL OF CARE: SNF  Routine Visit  CHIEF COMPLAINT:  Manage hyperlipidemia, dementia and renovascular hypertension.    HISTORY OF PRESENT ILLNESS:  REASSESSMENT OF ONGOING PROBLEM(S):  HYPERLIPIDEMIA: No complications from the medications presently being used. Last fasting lipid panel showed :   In 07/2012, fasting lipid panel normal, in 5/14 fasting lipid panel normal, in 11-14 HDL 38 otherwise fasting lipid panel normal.  HTN: Pt 's HTN remains stable.  Denies CP, sob, DOE, pedal edema, headaches, dizziness or visual disturbances.  No complications from the medications currently being used.  Last BP : 150/90,  160/90, 145/79, 100/56, 150/84,158/88, 150/84, 113/85, 130/70, 130/72, 132/66, 130/78.  DEMENTIA: The dementia remaines stable and continues to function adequately in the current living environment with supervision.  The patient has had little changes in behavior. No complications noted from the medications presently being used.  PAST MEDICAL HISTORY : Reviewed.  No changes.  CURRENT MEDICATIONS: Reviewed per [facility]  REVIEW OF SYSTEMS:  GENERAL: no change in appetite, no fatigue, no weight changes, no fever, chills or weakness RESPIRATORY: no cough, SOB, DOE, wheezing, hemoptysis CARDIAC: no chest pain, edema or palpitations GI: no abdominal pain, diarrhea, constipation, heart burn, nausea or vomiting  PHYSICAL EXAMINATION  VS:  T 97.3      P 82     RR 24    BP 130/78      WT (Lb) 200  GENERAL: no acute distress, normal body habitus NECK: supple, trachea midline, no neck masses, no thyroid tenderness, no thyromegaly RESPIRATORY: breathing is even & unlabored, BS CTAB CARDIAC: RRR, no murmur,no extra heart sounds, no edema GI: abdomen soft, normal BS, no masses, no tenderness, no hepatomegaly, no splenomegaly PSYCHIATRIC: the  patient is alert & oriented to person, affect & behavior appropriate  LABS/RADIOLOGY: 11-14 hemoglobin 12.5, MCV 84 otherwise CBC normal, glucose 105, creatinine 1.42, total protein 5.8, albumin 3.5 otherwise CMP normal  8-14 liver profile normal  7/14 Hb 9, mcv 66 ow cbc nl 6/14 hemoglobin 8, MCV 66, platelets 423, WBC 6 point 8, TIBC 2:30, serum iron 14% saturation 6, ferritin 41, hemoglobin A1c 6.8   5/14 WBC 11, hemoglobin 8.8, MCV 68, platelets 385, glucose 122, BUN 28, creatinine 1.69, CO2 18 11/2012:  Hemoglobin 11.4, MCV 83, platelets 388, WBC 6.7.    Glucose 127, creatinine 1.77, otherwise BMP normal.    07/2012:  Liver profile normal.  ASSESSMENT/PLAN:  Renovascular hypertension- improved.  Hyperlipidemia.  Well controlled.   Dementia.  Continue current medications.   Chronic kidney disease.   Renal functions improved.  BPH.  Continue Flomax.   GERD.  Continue Prilosec.    Anemia of chronic kidney disease-stable.  Thrombocytosis-stable.  CPT CODE: 35465

## 2013-09-23 ENCOUNTER — Encounter: Payer: Self-pay | Admitting: Internal Medicine

## 2013-09-23 NOTE — Progress Notes (Signed)
Patient ID: [name], male   DOB: 1946-08-16, 68 y.o.   MRN: [id number]          PROGRESS NOTE  DATE: 08/10/2013     FACILITY:  [facility]  LEVEL OF CARE: SNF (31)  Acute Visit  CHIEF COMPLAINT:  Manage anemia of chronic kidney disease and chronic kidney disease stage III.    HISTORY OF PRESENT ILLNESS: I was requested by the staff to assess the patient regarding above problem(s):  ANEMIA: The anemia has been stable. The patient denies fatigue, melena or hematochezia. No complications from the medications currently being used.  On 08/05/2013:  Hemoglobin 12.5, MCV 84.  In 03/2013:  Hemoglobin 9.    CHRONIC KIDNEY DISEASE: The patient's chronic kidney disease remains stable.  Patient denies increasing lower extremity swelling or confusion. Last BUN and creatinine are:  On 08/05/2013:  BUN 18, creatinine 1.42.  In 01/2013:  Creatinine 1.69.    PAST MEDICAL HISTORY : Reviewed.  No changes.  CURRENT MEDICATIONS: Reviewed per [facility]  REVIEW OF SYSTEMS:  GENERAL: no change in appetite, no fatigue, no weight changes, no fever, chills or weakness RESPIRATORY: no cough, SOB, DOE,, wheezing, hemoptysis CARDIAC: no chest pain, edema or palpitations GI: no abdominal pain, diarrhea, constipation, heart burn, nausea or vomiting  PHYSICAL EXAMINATION  GENERAL: no acute distress, moderately obese body habitus NECK: supple, trachea midline, no neck masses, no thyroid tenderness, no thyromegaly RESPIRATORY: breathing is even & unlabored, BS CTAB CARDIAC: RRR, no murmur,no extra heart sounds, no edema GI: abdomen soft, normal BS, no masses, no tenderness, no hepatomegaly, no splenomegaly PSYCHIATRIC: the patient is alert & oriented to person, affect & behavior appropriate  ASSESSMENT/PLAN:  Anemia of chronic kidney disease.  Hemoglobin improved.   Continue iron.    Chronic kidney disease stage III.  Renal functions improved.    THN Metrics:   No tobacco use.   Aspirin 81 mg.  BP:  130/72.    CPT CODE: 08657

## 2013-10-19 ENCOUNTER — Non-Acute Institutional Stay (SKILLED_NURSING_FACILITY): Payer: Medicare Other | Admitting: Internal Medicine

## 2013-10-19 DIAGNOSIS — F0390 Unspecified dementia without behavioral disturbance: Secondary | ICD-10-CM

## 2013-10-19 DIAGNOSIS — I150 Renovascular hypertension: Secondary | ICD-10-CM

## 2013-10-19 DIAGNOSIS — E785 Hyperlipidemia, unspecified: Secondary | ICD-10-CM

## 2013-10-19 DIAGNOSIS — N183 Chronic kidney disease, stage 3 unspecified: Secondary | ICD-10-CM

## 2013-10-20 ENCOUNTER — Encounter (HOSPITAL_COMMUNITY): Payer: Self-pay | Admitting: Emergency Medicine

## 2013-10-20 ENCOUNTER — Emergency Department (HOSPITAL_COMMUNITY): Payer: Medicare Other

## 2013-10-20 ENCOUNTER — Inpatient Hospital Stay (HOSPITAL_COMMUNITY)
Admission: EM | Admit: 2013-10-20 | Discharge: 2013-10-25 | DRG: 872 | Disposition: A | Discharge status: Skilled Nursing Facility | Payer: Medicare Other | Attending: Internal Medicine | Admitting: Internal Medicine

## 2013-10-20 DIAGNOSIS — D62 Acute posthemorrhagic anemia: Secondary | ICD-10-CM | POA: Diagnosis present

## 2013-10-20 DIAGNOSIS — R000 Tachycardia, unspecified: Secondary | ICD-10-CM | POA: Diagnosis present

## 2013-10-20 DIAGNOSIS — K219 Gastro-esophageal reflux disease without esophagitis: Secondary | ICD-10-CM | POA: Diagnosis present

## 2013-10-20 DIAGNOSIS — N189 Chronic kidney disease, unspecified: Secondary | ICD-10-CM

## 2013-10-20 DIAGNOSIS — I129 Hypertensive chronic kidney disease with stage 1 through stage 4 chronic kidney disease, or unspecified chronic kidney disease: Secondary | ICD-10-CM | POA: Diagnosis present

## 2013-10-20 DIAGNOSIS — N183 Chronic kidney disease, stage 3 unspecified: Secondary | ICD-10-CM | POA: Diagnosis present

## 2013-10-20 DIAGNOSIS — I5022 Chronic systolic (congestive) heart failure: Secondary | ICD-10-CM | POA: Diagnosis present

## 2013-10-20 DIAGNOSIS — I69922 Dysarthria following unspecified cerebrovascular disease: Secondary | ICD-10-CM

## 2013-10-20 DIAGNOSIS — T83511A Infection and inflammatory reaction due to indwelling urethral catheter, initial encounter: Secondary | ICD-10-CM | POA: Diagnosis present

## 2013-10-20 DIAGNOSIS — Y846 Urinary catheterization as the cause of abnormal reaction of the patient, or of later complication, without mention of misadventure at the time of the procedure: Secondary | ICD-10-CM | POA: Diagnosis present

## 2013-10-20 DIAGNOSIS — R29898 Other symptoms and signs involving the musculoskeletal system: Secondary | ICD-10-CM | POA: Diagnosis present

## 2013-10-20 DIAGNOSIS — Z7982 Long term (current) use of aspirin: Secondary | ICD-10-CM

## 2013-10-20 DIAGNOSIS — N400 Enlarged prostate without lower urinary tract symptoms: Secondary | ICD-10-CM | POA: Diagnosis present

## 2013-10-20 DIAGNOSIS — E872 Acidosis, unspecified: Secondary | ICD-10-CM | POA: Diagnosis not present

## 2013-10-20 DIAGNOSIS — I509 Heart failure, unspecified: Secondary | ICD-10-CM | POA: Diagnosis present

## 2013-10-20 DIAGNOSIS — N390 Urinary tract infection, site not specified: Secondary | ICD-10-CM | POA: Diagnosis present

## 2013-10-20 DIAGNOSIS — I69998 Other sequelae following unspecified cerebrovascular disease: Secondary | ICD-10-CM

## 2013-10-20 DIAGNOSIS — K920 Hematemesis: Secondary | ICD-10-CM | POA: Diagnosis present

## 2013-10-20 DIAGNOSIS — K3184 Gastroparesis: Secondary | ICD-10-CM | POA: Diagnosis present

## 2013-10-20 DIAGNOSIS — I4891 Unspecified atrial fibrillation: Secondary | ICD-10-CM

## 2013-10-20 DIAGNOSIS — A419 Sepsis, unspecified organism: Principal | ICD-10-CM | POA: Diagnosis present

## 2013-10-20 DIAGNOSIS — E785 Hyperlipidemia, unspecified: Secondary | ICD-10-CM | POA: Diagnosis present

## 2013-10-20 DIAGNOSIS — Z79899 Other long term (current) drug therapy: Secondary | ICD-10-CM

## 2013-10-20 DIAGNOSIS — E870 Hyperosmolality and hypernatremia: Secondary | ICD-10-CM

## 2013-10-20 DIAGNOSIS — N179 Acute kidney failure, unspecified: Secondary | ICD-10-CM

## 2013-10-20 DIAGNOSIS — F0390 Unspecified dementia without behavioral disturbance: Secondary | ICD-10-CM | POA: Diagnosis present

## 2013-10-20 LAB — CBC WITH DIFFERENTIAL/PLATELET
BASOS ABS: 0 10*3/uL (ref 0.0–0.1)
Basophils Relative: 0 % (ref 0–1)
EOS ABS: 0 10*3/uL (ref 0.0–0.7)
Eosinophils Relative: 0 % (ref 0–5)
HCT: 42.9 % (ref 39.0–52.0)
Hemoglobin: 14.2 g/dL (ref 13.0–17.0)
Lymphocytes Relative: 10 % — ABNORMAL LOW (ref 12–46)
Lymphs Abs: 1.4 10*3/uL (ref 0.7–4.0)
MCH: 28.2 pg (ref 26.0–34.0)
MCHC: 33.1 g/dL (ref 30.0–36.0)
MCV: 85.3 fL (ref 78.0–100.0)
Monocytes Absolute: 1.5 10*3/uL — ABNORMAL HIGH (ref 0.1–1.0)
Monocytes Relative: 11 % (ref 3–12)
NEUTROS ABS: 11 10*3/uL — AB (ref 1.7–7.7)
Neutrophils Relative %: 79 % — ABNORMAL HIGH (ref 43–77)
PLATELETS: 465 10*3/uL — AB (ref 150–400)
RBC: 5.03 MIL/uL (ref 4.22–5.81)
RDW: 14 % (ref 11.5–15.5)
WBC: 13.9 10*3/uL — ABNORMAL HIGH (ref 4.0–10.5)

## 2013-10-20 LAB — COMPREHENSIVE METABOLIC PANEL
ALBUMIN: 3.5 g/dL (ref 3.5–5.2)
ALK PHOS: 80 U/L (ref 39–117)
ALT: 11 U/L (ref 0–53)
AST: 16 U/L (ref 0–37)
BUN: 42 mg/dL — ABNORMAL HIGH (ref 6–23)
CO2: 25 mEq/L (ref 19–32)
Calcium: 10.1 mg/dL (ref 8.4–10.5)
Chloride: 105 mEq/L (ref 96–112)
Creatinine, Ser: 1.85 mg/dL — ABNORMAL HIGH (ref 0.50–1.35)
GFR calc Af Amer: 41 mL/min — ABNORMAL LOW (ref >90)
GFR calc non Af Amer: 36 mL/min — ABNORMAL LOW (ref >90)
Glucose, Bld: 174 mg/dL — ABNORMAL HIGH (ref 70–99)
Potassium: 4.1 mEq/L (ref 3.7–5.3)
SODIUM: 149 meq/L — AB (ref 137–147)
TOTAL PROTEIN: 7.9 g/dL (ref 6.0–8.3)
Total Bilirubin: <0.2 mg/dL — ABNORMAL LOW (ref 0.3–1.2)

## 2013-10-20 LAB — GLUCOSE, CAPILLARY
GLUCOSE-CAPILLARY: 211 mg/dL — AB (ref 70–99)
Glucose-Capillary: 106 mg/dL — ABNORMAL HIGH (ref 70–99)

## 2013-10-20 LAB — PRO B NATRIURETIC PEPTIDE: Pro B Natriuretic peptide (BNP): 5795 pg/mL — ABNORMAL HIGH (ref 0–125)

## 2013-10-20 LAB — URINALYSIS, ROUTINE W REFLEX MICROSCOPIC
GLUCOSE, UA: NEGATIVE mg/dL
Hgb urine dipstick: NEGATIVE
Ketones, ur: NEGATIVE mg/dL
NITRITE: NEGATIVE
PH: 8.5 — AB (ref 5.0–8.0)
Protein, ur: >300 mg/dL — AB
Specific Gravity, Urine: 1.023 (ref 1.005–1.030)
Urobilinogen, UA: 1 mg/dL (ref 0.0–1.0)

## 2013-10-20 LAB — POCT I-STAT TROPONIN I: Troponin i, poc: 0.15 ng/mL (ref 0.00–0.08)

## 2013-10-20 LAB — TROPONIN I: Troponin I: 0.35 ng/mL (ref <0.30)

## 2013-10-20 LAB — URINE MICROSCOPIC-ADD ON

## 2013-10-20 LAB — CG4 I-STAT (LACTIC ACID): Lactic Acid, Venous: 2.74 mmol/L — ABNORMAL HIGH (ref 0.5–2.2)

## 2013-10-20 LAB — LIPASE, BLOOD: LIPASE: 24 U/L (ref 11–59)

## 2013-10-20 LAB — MRSA PCR SCREENING: MRSA BY PCR: NEGATIVE

## 2013-10-20 LAB — LACTIC ACID, PLASMA: LACTIC ACID, VENOUS: 3.3 mmol/L — AB (ref 0.5–2.2)

## 2013-10-20 MED ORDER — ONDANSETRON HCL 4 MG PO TABS: 4.0000 mg | ORAL_TABLET | Freq: Four times a day (QID) | ORAL | Status: DC | PRN

## 2013-10-20 MED ORDER — INSULIN ASPART 100 UNIT/ML ~~LOC~~ SOLN
0.0000 [IU] | Freq: Three times a day (TID) | SUBCUTANEOUS | Status: DC
  Administered 2013-10-21: 2 [IU] via SUBCUTANEOUS

## 2013-10-20 MED ORDER — MEMANTINE HCL 10 MG PO TABS
10.0000 mg | ORAL_TABLET | Freq: Two times a day (BID) | ORAL | Status: DC
  Administered 2013-10-20: 10 mg via ORAL
  Filled 2013-10-20 (×3): qty 1

## 2013-10-20 MED ORDER — METOPROLOL TARTRATE 1 MG/ML IV SOLN
5.0000 mg | Freq: Four times a day (QID) | INTRAVENOUS | Status: DC | PRN
  Filled 2013-10-20: qty 5

## 2013-10-20 MED ORDER — DOCUSATE SODIUM 100 MG PO CAPS
100.0000 mg | ORAL_CAPSULE | Freq: Two times a day (BID) | ORAL | Status: DC
  Administered 2013-10-20 – 2013-10-25 (×7): 100 mg via ORAL
  Filled 2013-10-20 (×7): qty 1

## 2013-10-20 MED ORDER — ATORVASTATIN CALCIUM 20 MG PO TABS
20.0000 mg | ORAL_TABLET | Freq: Every day | ORAL | Status: DC
  Administered 2013-10-20: 20 mg via ORAL
  Filled 2013-10-20 (×2): qty 1

## 2013-10-20 MED ORDER — SODIUM CHLORIDE 0.9 % IJ SOLN
3.0000 mL | Freq: Two times a day (BID) | INTRAMUSCULAR | Status: DC
  Administered 2013-10-21 – 2013-10-25 (×8): 3 mL via INTRAVENOUS

## 2013-10-20 MED ORDER — METOPROLOL TARTRATE 1 MG/ML IV SOLN
2.5000 mg | INTRAVENOUS | Status: DC | PRN
  Administered 2013-10-20: 2.5 mg via INTRAVENOUS
  Administered 2013-10-21: 5 mg via INTRAVENOUS
  Filled 2013-10-20: qty 5

## 2013-10-20 MED ORDER — CARVEDILOL 6.25 MG PO TABS
6.2500 mg | ORAL_TABLET | Freq: Once | ORAL | Status: DC
  Filled 2013-10-20 (×2): qty 1

## 2013-10-20 MED ORDER — SODIUM CHLORIDE 0.9 % IV BOLUS (SEPSIS)
500.0000 mL | Freq: Once | INTRAVENOUS | Status: AC
  Administered 2013-10-20: 500 mL via INTRAVENOUS

## 2013-10-20 MED ORDER — LACTULOSE 10 GM/15ML PO SOLN
20.0000 g | Freq: Two times a day (BID) | ORAL | Status: DC
  Administered 2013-10-20: 20 g via ORAL
  Filled 2013-10-20 (×4): qty 30

## 2013-10-20 MED ORDER — DILTIAZEM HCL 100 MG IV SOLR
5.0000 mg/h | INTRAVENOUS | Status: DC
  Administered 2013-10-20: 5 mg/h via INTRAVENOUS

## 2013-10-20 MED ORDER — DILTIAZEM HCL 25 MG/5ML IV SOLN: 20.0000 mg | Freq: Once | INTRAVENOUS | Status: DC

## 2013-10-20 MED ORDER — POLYETHYLENE GLYCOL 3350 17 G PO PACK
17.0000 g | PACK | Freq: Every day | ORAL | Status: DC | PRN
  Filled 2013-10-20: qty 1

## 2013-10-20 MED ORDER — SODIUM CHLORIDE 0.9 % IV BOLUS (SEPSIS)
500.0000 mL | Freq: Once | INTRAVENOUS | Status: AC
Start: 2013-10-20 — End: 2013-10-20
  Administered 2013-10-20: 500 mL via INTRAVENOUS

## 2013-10-20 MED ORDER — DILTIAZEM HCL 25 MG/5ML IV SOLN
15.0000 mg | Freq: Once | INTRAVENOUS | Status: AC
  Administered 2013-10-20: 15 mg via INTRAVENOUS

## 2013-10-20 MED ORDER — INSULIN ASPART 100 UNIT/ML ~~LOC~~ SOLN
0.0000 [IU] | Freq: Every day | SUBCUTANEOUS | Status: DC
  Administered 2013-10-20: 2 [IU] via SUBCUTANEOUS

## 2013-10-20 MED ORDER — VENLAFAXINE HCL ER 75 MG PO CP24
75.0000 mg | ORAL_CAPSULE | Freq: Every day | ORAL | Status: DC
  Administered 2013-10-21 – 2013-10-25 (×5): 75 mg via ORAL
  Filled 2013-10-20 (×6): qty 1

## 2013-10-20 MED ORDER — ONDANSETRON HCL 4 MG/2ML IJ SOLN
4.0000 mg | Freq: Once | INTRAMUSCULAR | Status: AC
  Administered 2013-10-20: 4 mg via INTRAVENOUS
  Filled 2013-10-20: qty 2

## 2013-10-20 MED ORDER — ONDANSETRON HCL 4 MG/2ML IJ SOLN
4.0000 mg | Freq: Four times a day (QID) | INTRAMUSCULAR | Status: DC | PRN
  Administered 2013-10-21 – 2013-10-23 (×3): 4 mg via INTRAVENOUS
  Filled 2013-10-20 (×3): qty 2

## 2013-10-20 MED ORDER — TAMSULOSIN HCL 0.4 MG PO CAPS
0.4000 mg | ORAL_CAPSULE | Freq: Every day | ORAL | Status: DC
  Administered 2013-10-20 – 2013-10-25 (×6): 0.4 mg via ORAL
  Filled 2013-10-20 (×6): qty 1

## 2013-10-20 MED ORDER — DILTIAZEM LOAD VIA INFUSION
10.0000 mg | Freq: Once | INTRAVENOUS | Status: AC
  Administered 2013-10-20: 10 mg via INTRAVENOUS
  Filled 2013-10-20: qty 10

## 2013-10-20 MED ORDER — PANTOPRAZOLE SODIUM 40 MG IV SOLR
40.0000 mg | Freq: Once | INTRAVENOUS | Status: AC
  Administered 2013-10-20: 40 mg via INTRAVENOUS
  Filled 2013-10-20: qty 40

## 2013-10-20 MED ORDER — METOCLOPRAMIDE HCL 5 MG PO TABS
5.0000 mg | ORAL_TABLET | Freq: Three times a day (TID) | ORAL | Status: DC
  Administered 2013-10-20: 5 mg via ORAL
  Filled 2013-10-20 (×8): qty 1

## 2013-10-20 MED ORDER — DEXTROSE 5 % IV SOLN
2.0000 g | Freq: Two times a day (BID) | INTRAVENOUS | Status: DC
  Administered 2013-10-20 – 2013-10-21 (×2): 2 g via INTRAVENOUS
  Filled 2013-10-20 (×3): qty 2

## 2013-10-20 MED ORDER — DILTIAZEM HCL 25 MG/5ML IV SOLN
20.0000 mg | Freq: Once | INTRAVENOUS | Status: AC
  Administered 2013-10-20: 20 mg via INTRAVENOUS

## 2013-10-20 MED ORDER — SODIUM CHLORIDE 0.9 % IV SOLN
INTRAVENOUS | Status: AC
  Administered 2013-10-20: 14:00:00 via INTRAVENOUS

## 2013-10-20 MED ORDER — DILTIAZEM HCL 100 MG IV SOLR
5.0000 mg/h | INTRAVENOUS | Status: DC
  Administered 2013-10-20: 5 mg/h via INTRAVENOUS
  Filled 2013-10-20: qty 100

## 2013-10-20 MED ORDER — FERROUS SULFATE 325 (65 FE) MG PO TABS
325.0000 mg | ORAL_TABLET | Freq: Two times a day (BID) | ORAL | Status: DC
  Administered 2013-10-20: 325 mg via ORAL
  Filled 2013-10-20 (×4): qty 1

## 2013-10-20 MED ORDER — ONDANSETRON HCL 4 MG/2ML IJ SOLN: 4.0000 mg | Freq: Once | INTRAMUSCULAR | Status: DC

## 2013-10-20 MED ORDER — DEXTROSE 5 % IV SOLN
2.0000 g | Freq: Once | INTRAVENOUS | Status: AC
  Administered 2013-10-20: 2 g via INTRAVENOUS

## 2013-10-20 MED ORDER — SODIUM CHLORIDE 0.9 % IV SOLN
INTRAVENOUS | Status: DC
  Administered 2013-10-20 – 2013-10-21 (×3): via INTRAVENOUS
  Administered 2013-10-21: 125 mL/h via INTRAVENOUS

## 2013-10-20 NOTE — ED Notes (Signed)
Per EMS - pt coming from [facility]. Pt has been vomiting all night, coffee ground emesis. The morning RN reports that he vomited X 4 today. Administered some phenergan. Upon ems arrival pt not vomiting and hasn't vomited since. Pt was able to drink some mountain dew this morning. 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 BP 140 palpated. HR 155, 97% on room air. Staff reports this is his normal baseline other than the vomiting.

## 2013-10-20 NOTE — ED Notes (Signed)
Results of lactic acid given to nurse Richardson Landry

## 2013-10-20 NOTE — ED Notes (Signed)
Patient had an episode of coffee ground emesis. HR continues to be in 160s-170s. EDP informed of episode and character of emesis and HR. Rectal temp obtained and was 101.5. Pts gown and linen changed. Will continue to assess.

## 2013-10-20 NOTE — ED Notes (Signed)
Called back 3S, spoke with Tammy to update on change in pt and fluids given.

## 2013-10-20 NOTE — ED Notes (Signed)
Attempted to gain IV access, unsuccessful. Another RN will attempt. 

## 2013-10-20 NOTE — ED Notes (Signed)
Pt actively vomited coffee ground emesis.

## 2013-10-20 NOTE — Progress Notes (Signed)
HR 120 - 140s A Fib RVR - so call to elink. Got cardizem gtt and the bolus dropped sbp from 140 to 95. Got 2L fluids as well at some point but no response  EF 35% with peridcardial effusion a year ago feb 2014  Plan 2.5mg  IV lopressor and reassess Hold po coreg Might need cards consult Repeat echo ordered  Dr. Brand Males, M.D., [facility].C.P Pulmonary and Critical Care Medicine Staff Physician [facility] Pager: [phone], If no answer or between  15:00h - 7:00h: call [phone]  10/20/2013 7:07 PM

## 2013-10-20 NOTE — H&P (Signed)
[facility] History and Physical  ERICA OSUNA GEX:[id number] DOB: 1946/02/24 DOA: 10/20/2013  Referring physician: er PCP: No primary provider on file.   Chief Complaint: sent from SNF for "dark vomitus"  HPI: [name] is a 68 y.o. male  Who lives at [facility] and has a chronic foley.  He comes in after having "dark emesis" at the SNF.  He is at his baseline dementia.  He had a similar presentation to the hospital last March.  He had an EGD at that time that showed hiatal hernia, minimal antral gastritis, and no source of bleeding.  During that visit, he also had a pericardial effusion requiring a pericardial window.  Today in the ER, he presents with "emesis" as well. No episodes in the ER, Hgb stable.  He was also found to have a WBC count and what appears to be a UTI in a patient with a chronic foley.  His HR was also found to be elevated- appears to have P waves and this also is similar to previous presentations. initally he was started on cardizem gtt but this caused his BP to drop His troponin was also elevated Patient said he had abd pain earlier but this has since resolved   Review of Systems:  Unable to do full ROS- patient has dementia  Past Medical History  Diagnosis Date  . Dementia   . Stroke   . Hydronephrosis   . Hypertension   . Hyperlipemia   . GERD (gastroesophageal reflux disease)   . BPH (benign prostatic hyperplasia)    Past Surgical History  Procedure Laterality Date  . Esophagogastroduodenoscopy N/A 11/11/2012    Procedure: ESOPHAGOGASTRODUODENOSCOPY (EGD);  Surgeon: Lear Ng, MD;  Location: [facility] ENDOSCOPY;  Service: Endoscopy;  Laterality: N/A;  . Subxyphoid pericardial window N/A 11/13/2012    Procedure: SUBXYPHOID PERICARDIAL WINDOW;  Surgeon: Grace Isaac, MD;  Location: [place];  Service: Thoracic;  Laterality: N/A;  . Esophagogastroduodenoscopy N/A 11/25/2012    Procedure: ESOPHAGOGASTRODUODENOSCOPY (EGD);  Surgeon: Winfield Cunas., MD;  Location: [facility] ENDOSCOPY;  Service: Endoscopy;  Laterality: N/A;   Social History:  reports that he has never smoked. He has never used smokeless tobacco. He reports that he does not drink alcohol or use illicit drugs.  No Known Allergies  No family history on file.   Prior to Admission medications   Medication Sig Start Date End Date Taking? Authorizing Provider  amLODipine (NORVASC) 10 MG tablet Take 10 mg by mouth daily.   Yes Historical Provider, MD  aspirin 81 MG tablet Take 81 mg by mouth daily.   Yes Historical Provider, MD  atorvastatin (LIPITOR) 20 MG tablet Take 1 tablet (20 mg total) by mouth daily at 6 PM. 12/04/12  Yes Belkys A Regalado, MD  cloNIDine (CATAPRES) 0.2 MG tablet Take 0.2 mg by mouth 2 (two) times daily.   Yes Historical Provider, MD  clopidogrel (PLAVIX) 75 MG tablet Take 75 mg by mouth daily with breakfast.   Yes Historical Provider, MD  docusate sodium (COLACE) 100 MG capsule Take 100 mg by mouth 2 (two) times daily.   Yes Historical Provider, MD  ferrous sulfate 325 (65 FE) MG tablet Take 325 mg by mouth 2 (two) times daily with a meal.   Yes Historical Provider, MD  hydrALAZINE (APRESOLINE) 50 MG tablet Take 50 mg by mouth 3 (three) times daily.   Yes Historical Provider, MD  lactulose (CHRONULAC) 10 GM/15ML solution Take 30 mLs by mouth  2 (two) times daily.   Yes Historical Provider, MD  memantine (NAMENDA) 10 MG tablet Take 10 mg by mouth 2 (two) times daily.   Yes Historical Provider, MD  metoCLOPramide (REGLAN) 5 MG tablet Take 1 tablet (5 mg total) by mouth 4 (four) times daily -  before meals and at bedtime. 11/27/12  Yes Bonnielee Haff, MD  metoprolol succinate (TOPROL-XL) 200 MG 24 hr tablet Take 1 tablet (200 mg total) by mouth daily. Take with or immediately following a meal. 11/20/12  Yes Nita Sells, MD  polyethylene glycol (MIRALAX / GLYCOLAX) packet Take 17 g by mouth daily. 12/04/12  Yes Belkys A Regalado, MD  Polyvinyl  Alcohol-Povidone (FRESHKOTE OP) Place 1 drop into both eyes 4 (four) times daily.   Yes Historical Provider, MD  promethazine (PHENERGAN) 25 MG suppository Place 25 mg rectally every 6 (six) hours as needed for nausea or vomiting.   Yes Historical Provider, MD  tamsulosin (FLOMAX) 0.4 MG CAPS Take 0.4 mg by mouth daily.   Yes Historical Provider, MD  venlafaxine XR (EFFEXOR-XR) 75 MG 24 hr capsule Take 75 mg by mouth daily with breakfast.   Yes Historical Provider, MD   Physical Exam: Filed Vitals:   10/20/13 1500  BP: 97/57  Pulse: 68  Temp: 98.3 F (36.8 C)  Resp: 23    BP 97/57  Pulse 68  Temp(Src) 98.3 F (36.8 C) (Oral)  Resp 23  Ht 6' (1.829 m)  Wt 86.3 kg (190 lb 4.1 oz)  BMI 25.80 kg/m2  SpO2 97%  General:  Appears calm and comfortable- knows name and where he is Eyes: PERRL, normal lids, irises & conjunctiva ENT: grossly normal hearing, lips & tongue Neck: no LAD, masses or thyromegaly Cardiovascular: tachy Telemetry: SR, no arrhythmias  Respiratory: CTA bilaterally, no w/r/r. Normal respiratory effort. Abdomen: soft, ntnd Skin: no rash or induration seen on limited exam Musculoskeletal: weak on right side Neurologic: weaker on right side- attempts to follow commands          Labs on Admission:  Basic Metabolic Panel:  Recent Labs Lab 10/20/13 1031  NA 149*  K 4.1  CL 105  CO2 25  GLUCOSE 174*  BUN 42*  CREATININE 1.85*  CALCIUM 10.1   Liver Function Tests:  Recent Labs Lab 10/20/13 1031  AST 16  ALT 11  ALKPHOS 80  BILITOT <0.2*  PROT 7.9  ALBUMIN 3.5    Recent Labs Lab 10/20/13 1031  LIPASE 24   No results found for this basename: AMMONIA,  in the last 168 hours CBC:  Recent Labs Lab 10/20/13 1031  WBC 13.9*  NEUTROABS 11.0*  HGB 14.2  HCT 42.9  MCV 85.3  PLT 465*   Cardiac Enzymes:  Recent Labs Lab 10/20/13 1031  TROPONINI 0.35*    BNP (last 3 results)  Recent Labs  11/12/12 2000 10/20/13 1031  PROBNP  818.8* 5795.0*   CBG: No results found for this basename: GLUCAP,  in the last 168 hours  Radiological Exams on Admission: Dg Chest Port 1 View  10/20/2013   CLINICAL DATA:  Emesis, tachycardia  EXAM: PORTABLE CHEST - 1 VIEW  COMPARISON:  DG CHEST 1 VIEW dated 11/29/2012; DG CHEST 1V PORT dated 11/24/2012; CT ABD/PELV WO CM dated 11/24/2012  FINDINGS: There are low lung volumes. There is no focal parenchymal opacity, pleural effusion, or pneumothorax. Stable cardiomediastinal silhouette.  The osseous structures are unremarkable.  IMPRESSION: No active disease.   Electronically Signed   By: Elbert Ewings  Patel   On: 10/20/2013 10:40    EKG: Independently reviewed. Tachy- wide complex  Assessment/Plan Active Problems:   Dementia   Hematemesis   Chronic kidney disease, stage III (moderate)   Sepsis   UTI (urinary tract infection)   Hypernatremia  UTI- await culture, abx, change foley  Sepsis due to UTI- IVF gentle  Hematemesis vs gastroparesis- will gastrocult any further emesis and trend Hgb- on reglan already- ? If blood vs old food in vomitus- continue to monitor- defer GI consult until picture is clear- patient is stable -suspect Hgb will drop with IVf  CKD- at baseline  Hypernatremia- IVF, recheck in AM, prob dehydration  Increased troponin- trend, probably demand plus renal failure, denies CP, monitor on tele  Dementia- at baseline  Tachycardia- holding BB for now- resume as soon as BP can handle   Code Status: full Family Communication: no family available Disposition Plan: SDU  Time spent: 75  min  Eulogio Bear [facility] Pager [phone]

## 2013-10-20 NOTE — ED Notes (Signed)
Results of troponin given to Dr. Rogene Houston

## 2013-10-20 NOTE — Progress Notes (Signed)
10/20/13 2100  Vitals  BP 131/76 mmHg  MAP (mmHg) 91  Pulse Rate ! 148  ECG Heart Rate ! 148  Resp 15   PT hr still 140-150"s, BP see above paged K Schorr, 20 mg Cardizem bolus ordered will continue to monitor.

## 2013-10-20 NOTE — Progress Notes (Signed)
ANTIBIOTIC CONSULT NOTE - INITIAL  Pharmacy Consult for cefepime Indication: rule out sepsis  No Known Allergies  Patient Measurements: Height: 5\' 10"  (177.8 cm) Weight: 180 lb (81.647 kg) IBW/kg (Calculated) : 73   Vital Signs: Temp: 101.8 F (38.8 C) (02/04 1015) Temp src: Rectal (02/04 1015) BP: 128/85 mmHg (02/04 1100) Pulse Rate: 147 (02/04 1100) Intake/Output from previous day:   Intake/Output from this shift:    Labs:  Recent Labs  10/20/13 1031  WBC 13.9*  HGB 14.2  PLT 465*   Estimated Creatinine Clearance: 48.7 ml/min (by C-G formula based on Cr of 1.5). No results found for this basename: VANCOTROUGH, VANCOPEAK, VANCORANDOM, GENTTROUGH, GENTPEAK, GENTRANDOM, TOBRATROUGH, TOBRAPEAK, TOBRARND, AMIKACINPEAK, AMIKACINTROU, AMIKACIN,  in the last 72 hours   Microbiology: No results found for this or any previous visit (from the past 720 hour(s)).  Medical History: Past Medical History  Diagnosis Date  . Dementia   . Stroke   . Hydronephrosis   . Hypertension   . Hyperlipemia   . GERD (gastroesophageal reflux disease)   . BPH (benign prostatic hyperplasia)    Assessment: 68 yo man from SNF.  Pharmacy consulted to dose cefepime for sepsis.  Wt 81.6 kg, creat 1.5, WBC 13.9, LA 2.74, troponin 0.15, temp 101.8, HR 162, RR 32.  Per EDP his urine looked awful and he was recently admitted with UTI.  CXR negative for PNA.  Pt actively vomiting with coffee grounds emesis. SNF reports vomiting x 4.   Cefepime 2/4>>  2/4 urine  2/4 BX x2   Goal of Therapy:  Eradicate infection  Plan:  1. Cefepime 2 mg IV x 1 ordered by EDP then cefepime 2 gm IV q12h 2. F/u renal fxn, wbc, temp, cxr, cultures.  Eudelia Bunch, Pharm.D. [phone] 10/20/2013 11:12 AM

## 2013-10-20 NOTE — ED Provider Notes (Addendum)
CSN: [id number]     Arrival date & time 10/20/13  [id number] History   First MD Initiated Contact with Patient 10/20/13 [phone]     Chief Complaint  Patient presents with  . Code Sepsis   (Consider location/radiation/quality/duration/timing/severity/associated sxs/prior Treatment) The history is provided by the nursing home and the EMS personnel. The history is limited by the condition of the patient.   patient sent in now for [facility]. With the report of vomiting all night.coffee-ground emesis. This morning RN reports that the patient vomited 4 times. Patient was given [facility] EMS arrived patient not vomiting has not vomited since EMS arrival. Patient was able to drink some [facility] this morning history of stroke and right-sided weakness and slurred speech. Patient's blood pressure per EMS was 140 palpable heart rate was around 155 room air sats were 97%. Patient is mental status is reported to be baseline as per the nursing facility. Here patient feels warm to report a fever. Patient with significant tachycardia route 170 beats per minute.  Level V caveat applies to the patient's history do to his previous stroke and baseline mental status not being L.-her questions appropriately.   Past Medical History  Diagnosis Date  . Dementia   . Stroke   . Hydronephrosis   . Hypertension   . Hyperlipemia   . GERD (gastroesophageal reflux disease)   . BPH (benign prostatic hyperplasia)    Past Surgical History  Procedure Laterality Date  . Esophagogastroduodenoscopy N/A 11/11/2012    Procedure: ESOPHAGOGASTRODUODENOSCOPY (EGD);  Surgeon: Lear Ng, MD;  Location: [facility] ENDOSCOPY;  Service: Endoscopy;  Laterality: N/A;  . Subxyphoid pericardial window N/A 11/13/2012    Procedure: SUBXYPHOID PERICARDIAL WINDOW;  Surgeon: Grace Isaac, MD;  Location: [place];  Service: Thoracic;  Laterality: N/A;  . Esophagogastroduodenoscopy N/A 11/25/2012    Procedure:  ESOPHAGOGASTRODUODENOSCOPY (EGD);  Surgeon: Winfield Cunas., MD;  Location: [facility] ENDOSCOPY;  Service: Endoscopy;  Laterality: N/A;   No family history on file. History  Substance Use Topics  . Smoking status: Never Smoker   . Smokeless tobacco: Never Used  . Alcohol Use: No    Review of Systems  Unable to perform ROS Psychiatric/Behavioral: Positive for confusion.   level V caveat applies to patients baseline mental status. Patient is alert but not able to reliably answer questions.  Allergies  Review of patient's allergies indicates no known allergies.  Home Medications   Current Outpatient Rx  Name  Route  Sig  Dispense  Refill  . amLODipine (NORVASC) 10 MG tablet   Oral   Take 10 mg by mouth daily.         Marland Kitchen aspirin 81 MG tablet   Oral   Take 81 mg by mouth daily.         Marland Kitchen atorvastatin (LIPITOR) 20 MG tablet   Oral   Take 1 tablet (20 mg total) by mouth daily at 6 PM.   30 tablet   0   . cloNIDine (CATAPRES) 0.2 MG tablet   Oral   Take 0.2 mg by mouth 2 (two) times daily.         . clopidogrel (PLAVIX) 75 MG tablet   Oral   Take 75 mg by mouth daily with breakfast.         . docusate sodium (COLACE) 100 MG capsule   Oral   Take 100 mg by mouth 2 (two) times daily.         Marland Kitchen  ferrous sulfate 325 (65 FE) MG tablet   Oral   Take 325 mg by mouth 2 (two) times daily with a meal.         . hydrALAZINE (APRESOLINE) 50 MG tablet   Oral   Take 50 mg by mouth 3 (three) times daily.         Marland Kitchen lactulose (CHRONULAC) 10 GM/15ML solution   Oral   Take 30 mLs by mouth 2 (two) times daily.         . memantine (NAMENDA) 10 MG tablet   Oral   Take 10 mg by mouth 2 (two) times daily.         . metoCLOPramide (REGLAN) 5 MG tablet   Oral   Take 1 tablet (5 mg total) by mouth 4 (four) times daily -  before meals and at bedtime.         . metoprolol succinate (TOPROL-XL) 200 MG 24 hr tablet   Oral   Take 1 tablet (200 mg total) by mouth daily.  Take with or immediately following a meal.   30 tablet   0   . polyethylene glycol (MIRALAX / GLYCOLAX) packet   Oral   Take 17 g by mouth daily.   14 each   0   . Polyvinyl Alcohol-Povidone (FRESHKOTE OP)   Both Eyes   Place 1 drop into both eyes 4 (four) times daily.         . promethazine (PHENERGAN) 25 MG suppository   Rectal   Place 25 mg rectally every 6 (six) hours as needed for nausea or vomiting.         . tamsulosin (FLOMAX) 0.4 MG CAPS   Oral   Take 0.4 mg by mouth daily.         Marland Kitchen venlafaxine XR (EFFEXOR-XR) 75 MG 24 hr capsule   Oral   Take 75 mg by mouth daily with breakfast.          BP 103/74  Pulse 162  Temp(Src) 101.5 F (38.6 C) (Rectal)  Resp 32  Ht 5\' 10"  (1.778 m)  Wt 180 lb (81.647 kg)  BMI 25.83 kg/m2  SpO2 98% Physical Exam  Nursing note and vitals reviewed. Constitutional: He appears well-developed and well-nourished. No distress.  HENT:  Head: Normocephalic and atraumatic.  Eyes: Conjunctivae are normal. Pupils are equal, round, and reactive to light.  Neck: Normal range of motion.  Cardiovascular: Normal heart sounds.   No murmur heard. Mark tachycardia but regular.  Pulmonary/Chest: Effort normal and breath sounds normal. No respiratory distress. He has no wheezes. He has no rales.  Abdominal: Soft. There is no tenderness.  Neurological: He is alert.  Right-sided weakness  Skin: Skin is warm. No rash noted.    ED Course  Procedures (including critical care time) Labs Review Labs Reviewed  CBC WITH DIFFERENTIAL - Abnormal; Notable for the following:    WBC 13.9 (*)    Platelets 465 (*)    Neutrophils Relative % 79 (*)    Neutro Abs 11.0 (*)    Lymphocytes Relative 10 (*)    Monocytes Absolute 1.5 (*)    All other components within normal limits  COMPREHENSIVE METABOLIC PANEL - Abnormal; Notable for the following:    Sodium 149 (*)    Glucose, Bld 174 (*)    BUN 42 (*)    Creatinine, Ser 1.85 (*)    Total  Bilirubin <0.2 (*)    GFR calc non Af Amer 36 (*)  GFR calc Af Amer 41 (*)    All other components within normal limits  URINALYSIS, ROUTINE W REFLEX MICROSCOPIC - Abnormal; Notable for the following:    APPearance TURBID (*)    pH 8.5 (*)    Bilirubin Urine SMALL (*)    Protein, ur >300 (*)    Leukocytes, UA LARGE (*)    All other components within normal limits  PRO B NATRIURETIC PEPTIDE - Abnormal; Notable for the following:    Pro B Natriuretic peptide (BNP) 5795.0 (*)    All other components within normal limits  URINE MICROSCOPIC-ADD ON - Abnormal; Notable for the following:    Bacteria, UA MANY (*)    Casts GRANULAR CAST (*)    Crystals TRIPLE PHOSPHATE CRYSTALS (*)    All other components within normal limits  TROPONIN I - Abnormal; Notable for the following:    Troponin I 0.35 (*)    All other components within normal limits  CG4 I-STAT (LACTIC ACID) - Abnormal; Notable for the following:    Lactic Acid, Venous 2.74 (*)    All other components within normal limits  POCT I-STAT TROPONIN I - Abnormal; Notable for the following:    Troponin i, poc 0.15 (*)    All other components within normal limits  URINE CULTURE  CULTURE, BLOOD (ROUTINE X 2)  CULTURE, BLOOD (ROUTINE X 2)  LIPASE, BLOOD   Imaging Review Dg Chest Port 1 View  10/20/2013   CLINICAL DATA:  Emesis, tachycardia  EXAM: PORTABLE CHEST - 1 VIEW  COMPARISON:  DG CHEST 1 VIEW dated 11/29/2012; DG CHEST 1V PORT dated 11/24/2012; CT ABD/PELV WO CM dated 11/24/2012  FINDINGS: There are low lung volumes. There is no focal parenchymal opacity, pleural effusion, or pneumothorax. Stable cardiomediastinal silhouette.  The osseous structures are unremarkable.  IMPRESSION: No active disease.   Electronically Signed   By: Kathreen Devoid   On: 10/20/2013 10:40    EKG Interpretation    Date/Time:  Wednesday October 20 2013 09:59:22 EST Ventricular Rate:  159 PR Interval:    QRS Duration: 136 QT Interval:  350 QTC  Calculation: 569 R Axis:   -68 Text Interpretation:  Wide-QRS tachycardia IVCD, consider atypical LBBB tachycardic otherwise  No significant change since last tracing Confirmed by Nesta Kimple  MD, Maloree Uplinger ([id number]) on 10/20/2013 10:12:24 AM           CRITICAL CARE Performed by: Mervin Kung. Total critical care time: 30 Critical care time was exclusive of separately billable procedures and treating other patients. Critical care was necessary to treat or prevent imminent or life-threatening deterioration. Critical care was time spent personally by me on the following activities: development of treatment plan with patient and/or surrogate as well as nursing, discussions with consultants, evaluation of patient's response to treatment, examination of patient, obtaining history from patient or surrogate, ordering and performing treatments and interventions, ordering and review of laboratory studies, ordering and review of radiographic studies, pulse oximetry and re-evaluation of patient's condition.  MDM   1. Sepsis   2. Tachycardia     That patient will be admitted to a step down. Discussed initially with pulmonary critical care and not consultation with the nasal patient is appropriate for step down. We'll continue IV fluids. Patient given of 1/2 L of normal saline without significant improvement in the heart rate. Heart rate appeared to be consistent with an SVT or a sinus tach. The rate was so high difficult to distinguish. Patient was started on diltiazem  a.m. after cultures were done after the fluids and after treatment with antibiotic presumed urosepsis. The diltiazem a.m. 10 mg bolus and then a drip was brought his heart rate down into the 130s 140s on monitor appears to be a sinus tach. Blood pressure is low as after the diltiazem a.m. was 92 now it's 259 systolic. Patient chest x-ray negative for pneumonia. Patient's mental status is unchanged. Patient has had so what appears to be some  coffee-ground emesis however he hemoglobin and hematocrit is very stable actually appears concentrated. Patient's like acid was elevated above 2 but less than 4. Patient's troponin slightly elevated at 0.35 most likely due to the stress affect of the rapid heart rate. No evidence of significant GI bleed. Bleed patient's symptoms are predominantly related to the urosepsis. In addition with a tachycardia arrhythmia. Patient's oxygenation has been very stable. As mentioned chest x-ray negative for pneumonia pulmonary edema.  The patient is followed by [facility] this will be a triad hospitalist admission. Temporary middle orders completed.     Mervin Kung, MD 10/20/13 [place], MD 10/20/13 [phone]

## 2013-10-20 NOTE — Progress Notes (Signed)
10/20/13 2016  Vitals  BP 140/72 mmHg  blood pressure post Cardizem bolus

## 2013-10-20 NOTE — ED Notes (Signed)
Attempted to call report. No answer on phone X 2 mins. Will call back

## 2013-10-20 NOTE — ED Notes (Signed)
Pt's mental status unchanged. Nad, skin warm and dry, resp e/u.

## 2013-10-20 NOTE — ED Notes (Signed)
MD reminded of pt's HR, fluids have gone in, no change in BP or HR.

## 2013-10-20 NOTE — Progress Notes (Signed)
Upon initial assesment pt HR 140's to 150's bp 85/46  As taken on left leg, placed cuff to left arm which showed 115/71 paged K Scchor who ordered 15 mg Cardizem bolus, post bolus BP 140/ 72 will continue to monitor.

## 2013-10-20 NOTE — Progress Notes (Signed)
10/20/13 2200  Vitals  BP 118/77 mmHg  MAP (mmHg) 82  Pulse Rate ! 146  ECG Heart Rate ! 147  Resp 19    Pt HR still 140's, BP see above, after 2 Cardizem bolus also repeat lactic acid  Went up from 2.7 to 3.3 K schorr notified Cardizem gtt started  No new orders regarding the increase lactic acid will continue to monitor called at 2200

## 2013-10-21 DIAGNOSIS — D62 Acute posthemorrhagic anemia: Secondary | ICD-10-CM

## 2013-10-21 LAB — CBC
HCT: 35.6 % — ABNORMAL LOW (ref 39.0–52.0)
HCT: 33.5 % — ABNORMAL LOW (ref 39.0–52.0)
HCT: 30.2 % — ABNORMAL LOW (ref 39.0–52.0)
HEMOGLOBIN: 11.2 g/dL — AB (ref 13.0–17.0)
Hemoglobin: 10.5 g/dL — ABNORMAL LOW (ref 13.0–17.0)
Hemoglobin: 9.3 g/dL — ABNORMAL LOW (ref 13.0–17.0)
MCH: 27.5 pg (ref 26.0–34.0)
MCH: 28 pg (ref 26.0–34.0)
MCH: 27.4 pg (ref 26.0–34.0)
MCHC: 31.3 g/dL (ref 30.0–36.0)
MCHC: 31.5 g/dL (ref 30.0–36.0)
MCHC: 30.8 g/dL (ref 30.0–36.0)
MCV: 87.3 fL (ref 78.0–100.0)
MCV: 89.3 fL (ref 78.0–100.0)
MCV: 89.1 fL (ref 78.0–100.0)
PLATELETS: 238 10*3/uL (ref 150–400)
PLATELETS: 246 10*3/uL (ref 150–400)
Platelets: 293 10*3/uL (ref 150–400)
RBC: 3.75 MIL/uL — ABNORMAL LOW (ref 4.22–5.81)
RBC: 4.08 MIL/uL — AB (ref 4.22–5.81)
RBC: 3.39 MIL/uL — ABNORMAL LOW (ref 4.22–5.81)
RDW: 14.3 % (ref 11.5–15.5)
RDW: 14.4 % (ref 11.5–15.5)
RDW: 14.3 % (ref 11.5–15.5)
WBC: 8.5 10*3/uL (ref 4.0–10.5)
WBC: 7.9 10*3/uL (ref 4.0–10.5)
WBC: 6.6 10*3/uL (ref 4.0–10.5)

## 2013-10-21 LAB — GLUCOSE, CAPILLARY
GLUCOSE-CAPILLARY: 126 mg/dL — AB (ref 70–99)
Glucose-Capillary: 164 mg/dL — ABNORMAL HIGH (ref 70–99)
Glucose-Capillary: 122 mg/dL — ABNORMAL HIGH (ref 70–99)
Glucose-Capillary: 122 mg/dL — ABNORMAL HIGH (ref 70–99)

## 2013-10-21 LAB — URINE CULTURE

## 2013-10-21 LAB — LACTIC ACID, PLASMA: LACTIC ACID, VENOUS: 2.6 mmol/L — AB (ref 0.5–2.2)

## 2013-10-21 LAB — BASIC METABOLIC PANEL
BUN: 40 mg/dL — ABNORMAL HIGH (ref 6–23)
CO2: 19 mEq/L (ref 19–32)
Calcium: 8.5 mg/dL (ref 8.4–10.5)
Chloride: 116 mEq/L — ABNORMAL HIGH (ref 96–112)
Creatinine, Ser: 1.62 mg/dL — ABNORMAL HIGH (ref 0.50–1.35)
GFR, EST AFRICAN AMERICAN: 49 mL/min — AB (ref >90)
GFR, EST NON AFRICAN AMERICAN: 42 mL/min — AB (ref >90)
Glucose, Bld: 174 mg/dL — ABNORMAL HIGH (ref 70–99)
Potassium: 4.3 mEq/L (ref 3.7–5.3)
SODIUM: 151 meq/L — AB (ref 137–147)

## 2013-10-21 LAB — TROPONIN I: Troponin I: <0.3 ng/mL (ref <0.30)

## 2013-10-21 MED ORDER — PANTOPRAZOLE SODIUM 40 MG IV SOLR
40.0000 mg | Freq: Two times a day (BID) | INTRAVENOUS | Status: DC
  Administered 2013-10-21 – 2013-10-22 (×3): 40 mg via INTRAVENOUS
  Filled 2013-10-21 (×4): qty 40

## 2013-10-21 MED ORDER — SODIUM CHLORIDE 0.9 % IV SOLN
8.0000 mg/h | INTRAVENOUS | Status: DC
  Administered 2013-10-21: 8 mg/h via INTRAVENOUS
  Filled 2013-10-21 (×2): qty 80

## 2013-10-21 MED ORDER — SODIUM CHLORIDE 0.9 % IV SOLN
80.0000 mg | Freq: Once | INTRAVENOUS | Status: AC
  Administered 2013-10-21: 80 mg via INTRAVENOUS
  Filled 2013-10-21: qty 80

## 2013-10-21 MED ORDER — METOPROLOL TARTRATE 12.5 MG HALF TABLET
12.5000 mg | ORAL_TABLET | Freq: Two times a day (BID) | ORAL | Status: DC
  Administered 2013-10-21 – 2013-10-22 (×2): 12.5 mg via ORAL
  Filled 2013-10-21 (×4): qty 1

## 2013-10-21 MED ORDER — METOCLOPRAMIDE HCL 5 MG/ML IJ SOLN
5.0000 mg | Freq: Four times a day (QID) | INTRAMUSCULAR | Status: DC
  Administered 2013-10-21 – 2013-10-23 (×8): 5 mg via INTRAVENOUS
  Filled 2013-10-21 (×12): qty 1

## 2013-10-21 MED ORDER — SODIUM CHLORIDE 0.9 % IV BOLUS (SEPSIS)
1000.0000 mL | INTRAVENOUS | Status: DC | PRN
  Administered 2013-10-21: 1000 mL via INTRAVENOUS

## 2013-10-21 MED ORDER — INSULIN ASPART 100 UNIT/ML ~~LOC~~ SOLN: 0.0000 [IU] | Freq: Every day | SUBCUTANEOUS | Status: DC

## 2013-10-21 MED ORDER — DEXTROSE 5 % IV SOLN
2.0000 g | INTRAVENOUS | Status: DC
  Administered 2013-10-22: 2 g via INTRAVENOUS
  Filled 2013-10-21: qty 2

## 2013-10-21 MED ORDER — INSULIN ASPART 100 UNIT/ML ~~LOC~~ SOLN: 0.0000 [IU] | Freq: Four times a day (QID) | SUBCUTANEOUS | Status: DC

## 2013-10-21 MED ORDER — SODIUM CHLORIDE 0.45 % IV SOLN
INTRAVENOUS | Status: DC
  Administered 2013-10-21: 125 mL/h via INTRAVENOUS
  Administered 2013-10-22 (×2): via INTRAVENOUS
  Administered 2013-10-22: 125 mL/h via INTRAVENOUS

## 2013-10-21 MED ORDER — INSULIN ASPART 100 UNIT/ML ~~LOC~~ SOLN: 0.0000 [IU] | Freq: Three times a day (TID) | SUBCUTANEOUS | Status: DC

## 2013-10-21 MED ORDER — PANTOPRAZOLE SODIUM 40 MG IV SOLR
40.0000 mg | Freq: Two times a day (BID) | INTRAVENOUS | Status: DC
Start: 2013-10-24 — End: 2013-10-21

## 2013-10-21 MED ORDER — SODIUM CHLORIDE 0.9 % IV BOLUS (SEPSIS)
1000.0000 mL | Freq: Once | INTRAVENOUS | Status: AC
  Administered 2013-10-21: 1000 mL via INTRAVENOUS

## 2013-10-21 MED ORDER — MEMANTINE HCL 10 MG PO TABS
10.0000 mg | ORAL_TABLET | Freq: Two times a day (BID) | ORAL | Status: DC
  Administered 2013-10-21 – 2013-10-25 (×8): 10 mg via ORAL
  Filled 2013-10-21 (×9): qty 1

## 2013-10-21 NOTE — Progress Notes (Signed)
Pt has had 4 bouts of coffle ground emesis  3 of which have been since 0200 hours bowels sounds hypoactive but audible, notified K schcorr who gave no new orders will continue to monitor

## 2013-10-21 NOTE — Consult Note (Signed)
[facility] Consultation Note  Referring Provider: Dr. Niel Hummer Primary Care Physician:  No primary provider on file.  Reason for Consultation:  Hematemesis.  HPI: [name] is a 68 y.o. male whom we've been asked to see for hematemesis.  Patient is resident of [facility], and has history of stroke, for which he is on clopidogrel, as well as dementia.  Patient was sent to ED for complaints of hematemesis.  Patient's nurse reports a few episodes of coffee ground emesis overnight.  No reported melena or hematochezia.  Patient denies abdominal pain.  Patient had similar presentation about one year ago, had EGD by Dr. Michail Sermon in February 2014 (fairly normal) followed by EGD by Dr. Oletta Lamas in March 2014, showing severe ulcerative esophagitis, with biopsies showing non-specific inflammation.  Patient does not appear to have been on PPI at his nursing facility.   Past Medical History  Diagnosis Date  . Dementia   . Stroke   . Hydronephrosis   . Hypertension   . Hyperlipemia   . GERD (gastroesophageal reflux disease)   . BPH (benign prostatic hyperplasia)     Past Surgical History  Procedure Laterality Date  . Esophagogastroduodenoscopy N/A 11/11/2012    Procedure: ESOPHAGOGASTRODUODENOSCOPY (EGD);  Surgeon: Lear Ng, MD;  Location: [facility] ENDOSCOPY;  Service: Endoscopy;  Laterality: N/A;  . Subxyphoid pericardial window N/A 11/13/2012    Procedure: SUBXYPHOID PERICARDIAL WINDOW;  Surgeon: Grace Isaac, MD;  Location: [place];  Service: Thoracic;  Laterality: N/A;  . Esophagogastroduodenoscopy N/A 11/25/2012    Procedure: ESOPHAGOGASTRODUODENOSCOPY (EGD);  Surgeon: Winfield Cunas., MD;  Location: [facility] ENDOSCOPY;  Service: Endoscopy;  Laterality: N/A;    Prior to Admission medications   Medication Sig Start Date End Date Taking? Authorizing Provider  amLODipine (NORVASC) 10 MG tablet Take 10 mg by mouth daily.   Yes Historical Provider, MD  aspirin 81 MG  tablet Take 81 mg by mouth daily.   Yes Historical Provider, MD  atorvastatin (LIPITOR) 20 MG tablet Take 1 tablet (20 mg total) by mouth daily at 6 PM. 12/04/12  Yes Belkys A Regalado, MD  cloNIDine (CATAPRES) 0.2 MG tablet Take 0.2 mg by mouth 2 (two) times daily.   Yes Historical Provider, MD  clopidogrel (PLAVIX) 75 MG tablet Take 75 mg by mouth daily with breakfast.   Yes Historical Provider, MD  docusate sodium (COLACE) 100 MG capsule Take 100 mg by mouth 2 (two) times daily.   Yes Historical Provider, MD  ferrous sulfate 325 (65 FE) MG tablet Take 325 mg by mouth 2 (two) times daily with a meal.   Yes Historical Provider, MD  hydrALAZINE (APRESOLINE) 50 MG tablet Take 50 mg by mouth 3 (three) times daily.   Yes Historical Provider, MD  lactulose (CHRONULAC) 10 GM/15ML solution Take 30 mLs by mouth 2 (two) times daily.   Yes Historical Provider, MD  memantine (NAMENDA) 10 MG tablet Take 10 mg by mouth 2 (two) times daily.   Yes Historical Provider, MD  metoCLOPramide (REGLAN) 5 MG tablet Take 1 tablet (5 mg total) by mouth 4 (four) times daily -  before meals and at bedtime. 11/27/12  Yes Bonnielee Haff, MD  metoprolol succinate (TOPROL-XL) 200 MG 24 hr tablet Take 1 tablet (200 mg total) by mouth daily. Take with or immediately following a meal. 11/20/12  Yes Nita Sells, MD  polyethylene glycol (MIRALAX / GLYCOLAX) packet Take 17 g by mouth daily. 12/04/12  Yes Elmarie Shiley, MD  Polyvinyl Alcohol-Povidone (FRESHKOTE OP) Place 1 drop into both eyes 4 (four) times daily.   Yes Historical Provider, MD  promethazine (PHENERGAN) 25 MG suppository Place 25 mg rectally every 6 (six) hours as needed for nausea or vomiting.   Yes Historical Provider, MD  tamsulosin (FLOMAX) 0.4 MG CAPS Take 0.4 mg by mouth daily.   Yes Historical Provider, MD  venlafaxine XR (EFFEXOR-XR) 75 MG 24 hr capsule Take 75 mg by mouth daily with breakfast.   Yes Historical Provider, MD    Current  Facility-Administered Medications  Medication Dose Route Frequency Provider Last Rate Last Dose  . 0.9 %  sodium chloride infusion   Intravenous Continuous Debbe Odea, MD 125 mL/hr at 10/21/13 1200    . [START ON 10/22/2013] ceFEPIme (MAXIPIME) 2 g in dextrose 5 % 50 mL IVPB  2 g Intravenous Q24H Georgina Peer, [facility]      . docusate sodium (COLACE) capsule 100 mg  100 mg Oral BID Geradine Girt, DO   100 mg at 10/21/13 1131  . insulin aspart (novoLOG) injection 0-9 Units  0-9 Units Subcutaneous Q6H Saima Rizwan, MD      . metoCLOPramide (REGLAN) injection 5 mg  5 mg Intravenous Q6H Debbe Odea, MD   5 mg at 10/21/13 1132  . ondansetron (ZOFRAN) tablet 4 mg  4 mg Oral Q6H PRN Geradine Girt, DO       Or  . ondansetron (ZOFRAN) injection 4 mg  4 mg Intravenous Q6H PRN Geradine Girt, DO   4 mg at 10/21/13 0450  . pantoprazole (PROTONIX) 80 mg in sodium chloride 0.9 % 250 mL infusion  8 mg/hr Intravenous Continuous Debbe Odea, MD 25 mL/hr at 10/21/13 1200 8 mg/hr at 10/21/13 1200  . [START ON 10/24/2013] pantoprazole (PROTONIX) injection 40 mg  40 mg Intravenous Q12H Saima Rizwan, MD      . polyethylene glycol (MIRALAX / GLYCOLAX) packet 17 g  17 g Oral Daily PRN Geradine Girt, DO      . sodium chloride 0.9 % injection 3 mL  3 mL Intravenous Q12H Jessica U Vann, DO   3 mL at 10/21/13 1120  . tamsulosin (FLOMAX) capsule 0.4 mg  0.4 mg Oral Daily Geradine Girt, DO   0.4 mg at 10/21/13 1131  . venlafaxine XR (EFFEXOR-XR) 24 hr capsule 75 mg  75 mg Oral Q breakfast Geradine Girt, DO   75 mg at 10/21/13 1122    Allergies as of 10/20/2013  . (No Known Allergies)    No family history on file.  History   Social History  . Marital Status: Single    Spouse Name: N/A    Number of Children: N/A  . Years of Education: N/A   Occupational History  . Not on file.   Social History Main Topics  . Smoking status: Never Smoker   . Smokeless tobacco: Never Used  . Alcohol Use: No  . Drug Use: No   . Sexual Activity: Not on file   Other Topics Concern  . Not on file   Social History Narrative  . No narrative on file    Review of Systems: Unable to obtain, due to patient's demented state.  Physical Exam: Vital signs in last 24 hours: Temp:  [98.3 F (36.8 C)-99.8 F (37.7 C)] 98.3 F (36.8 C) (02/05 1200) Pulse Rate:  [68-162] 104 (02/05 0700) Resp:  [0-39] 22 (02/05 0700) BP: (79-142)/(39-97) 115/64 mmHg (02/05 0700) SpO2:  [94 %-99 %]  97 % (02/05 0700) Weight:  [86.3 kg (190 lb 4.1 oz)-87.4 kg (192 lb 10.9 oz)] 87.4 kg (192 lb 10.9 oz) (02/05 0400)   General:   Alert, chronic dementia, but can answer some questions Head:  Normocephalic and atraumatic. Eyes:  Sclera clear, no icterus.   Conjunctiva pink. Ears:  Normal auditory acuity. Nose:  No deformity, discharge,  or lesions. Mouth:  No deformity or lesions.  Oropharynx pink & moist. Neck:  Supple; no masses or thyromegaly. Lungs:  Clear throughout to auscultation.   No wheezes, crackles, or rhonchi. No acute distress. Heart:  Regular rate and rhythm; no murmurs, clicks, rubs,  or gallops. Abdomen:  Soft, nontender and nondistended. No masses, hepatosplenomegaly or hernias noted. Normal bowel sounds, without guarding, and without rebound.     Msk:  Diffuse muscular atrophy, otherwise Symmetrical without gross deformities. Slumped posture in hospital bed Pulses:  Normal pulses noted. Extremities:  Some swelling of both feel Neurologic:  Chronic dementia, but can answer some simple questions;  Right hemiparesis, otherwise grossly normal neurologically. Skin:  Intact without significant lesions or rashes.  Psych:  Alert and cooperative. Normal mood and affect.   Lab Results:  Recent Labs  10/20/13 1031 10/21/13 0055  WBC 13.9* 8.5  HGB 14.2 11.2*  HCT 42.9 35.6*  PLT 465* 293   BMET  Recent Labs  10/20/13 1031 10/21/13 0055  NA 149* 151*  K 4.1 4.3  CL 105 116*  CO2 25 19  GLUCOSE 174* 174*  BUN  42* 40*  CREATININE 1.85* 1.62*  CALCIUM 10.1 8.5   LFT  Recent Labs  10/20/13 1031  PROT 7.9  ALBUMIN 3.5  AST 16  ALT 11  ALKPHOS 80  BILITOT <0.2*   PT/INR No results found for this basename: LABPROT, INR,  in the last 72 hours  Studies/Results: Dg Chest Port 1 View  10/20/2013   CLINICAL DATA:  Emesis, tachycardia  EXAM: PORTABLE CHEST - 1 VIEW  COMPARISON:  DG CHEST 1 VIEW dated 11/29/2012; DG CHEST 1V PORT dated 11/24/2012; CT ABD/PELV WO CM dated 11/24/2012  FINDINGS: There are low lung volumes. There is no focal parenchymal opacity, pleural effusion, or pneumothorax. Stable cardiomediastinal silhouette.  The osseous structures are unremarkable.  IMPRESSION: No active disease.   Electronically Signed   By: Kathreen Devoid   On: 10/20/2013 10:40   Impression:  1.  Coffee ground emesis.  No bright red hematemesis.  No melena or hematochezia.  Similar presentation about one year ago.  Suspect esophagitis, likely exacerbated by his fairly limited mobility, protracted intervals nearly completely supine in bed, as well as mucosal irritation from clopidogrel. 2.  Chronic dementia. 3.  Multiple medical problems.  Plan:  1.  Protonix 40 mg IV Q 12 hours x 24 hours, then consideration transition to oral formulation.  Patient would probably benefit from continuing PPI indefinitely. 2.  Clear liquid diet, advance slowly as tolerated. 3.  Head-of-bed elevated 30-45 degrees at all times. 4.  Avoid/minimize NSAIDs. 5.  Small, frequent meals.   6.  In absence of bright red hematemesis, or interval development of significant melena and/or hematochezia, I don't see need to repeat another endoscopy at the present time. 7.  Will follow; thank you for the consult.    LOS: 1 day   Jarmaine Ehrler M  10/21/2013, 12:20 PM

## 2013-10-21 NOTE — Progress Notes (Signed)
Utilization review completed.  

## 2013-10-21 NOTE — Progress Notes (Addendum)
[facility] Progress Note [name] TEAM 1 - Stepdown/ICU TEAM   [name] [id number]:[id number] DOB: Feb 24, 1946 DOA: 10/20/2013 PCP: No primary provider on file.  Brief narrative: [name] is a 68 y.o. male whom we've been asked to see for hematemesis. Patient is resident of [facility], and has history of stroke, for which he is on clopidogrel, as well as dementia. Patient was sent to ED for complaints of hematemesis. Patient's nurse reports a few episodes of coffee ground emesis overnight. No reported melena or hematochezia. Patient denies abdominal pain. Patient had similar presentation about one year ago, had EGD by Dr. Michail Sermon in February 2014 (fairly normal) followed by EGD by Dr. Oletta Lamas in March 2014, showing severe ulcerative esophagitis, with biopsies showing non-specific inflammation. Patient does not appear to have been on PPI at his nursing facility.   Subjective: Wants to drink. No complaints.   Assessment/Plan: Principal Problem:   Hematemesis - cont IV Protonix - GI has evaluated and no plans to scope unless uncontrolled bleeding - Clears per GI - hold ASA and Plavix  Active Problems:   Anemia due to blood loss, acute - Hgb dropping from 13- 10 - cont to follow    UTI (urinary tract infection)/  Sepsis - fever 101/ chronic foley - f/u cultures- has grown only multiple morphotypes in the past - cont Cefepime    Hypernatremia - change to 1/2 NS- suspect he is still dehydrated  Chronic systolic CHF - last EF 51-88% 1/14 - currently dehydrated- (400 cc urine in past 10 hrs) - careful with hydration- have notified RN to call if he is becoming hypoxic  Hypotension with h/o HTN- Lactic acidosis - have been holding antihypertensives - will resume Lopressor at a very low dose to prevent continued tachycardia with holding B Blocker and CCB - cont to hydrate  Tachycardia - given aggressive hydration - will resume low dose  B Blocker    Dementia -  stable- no behavioral disturbances - cont [name]    Chronic kidney disease, stage III (moderate) - Cr about baseline  Code Status: Full code- d/w patient's brother- he was going to talk with the patient's daughter and let us know if they changed their minds Family Communication: patient's brother, [name] Disposition Plan: SDU and back to SNF  Consultants: GI  Procedures: None  Antibiotics: Cefepime 2/4  DVT prophylaxis: SCds  Objective: Blood pressure 115/64, pulse 104, temperature 98.3 F (36.8 C), temperature source Axillary, resp. rate 22, height 6' (1.829 m), weight 87.4 kg (192 lb 10.9 oz), SpO2 97.00%.  Intake/Output Summary (Last 24 hours) at 10/21/13 1616 Last data filed at 10/21/13 1200  Gross per 24 hour  Intake   1543 ml  Output    829 ml  Net    714 ml     Exam: General: No acute respiratory distress- alert, confused to time and place Lungs: Clear to auscultation bilaterally without wheezes or crackles Cardiovascular: Regular rate and rhythm without murmur gallop or rub normal S1 and S2 Abdomen: Nontender, nondistended, soft, bowel sounds positive, no rebound, no ascites, no appreciable mass Extremities: No significant cyanosis, clubbing, or edema bilateral lower extremities  Data Reviewed: Basic Metabolic Panel:  Recent Labs Lab 10/20/13 1031 10/21/13 0055  NA 149* 151*  K 4.1 4.3  CL 105 116*  CO2 25 19  GLUCOSE 174* 174*  BUN 42* 40*  CREATININE 1.85* 1.62*  CALCIUM 10.1 8.5   Liver Function Tests:  Recent Labs Lab 10/20/13  1031  AST 16  ALT 11  ALKPHOS 80  BILITOT <0.2*  PROT 7.9  ALBUMIN 3.5    Recent Labs Lab 10/20/13 1031  LIPASE 24   No results found for this basename: AMMONIA,  in the last 168 hours CBC:  Recent Labs Lab 10/20/13 1031 10/21/13 0055 10/21/13 1500  WBC 13.9* 8.5 7.9  NEUTROABS 11.0*  --   --   HGB 14.2 11.2* 10.5*  HCT 42.9 35.6* 33.5*  MCV 85.3 87.3 89.3  PLT 465* 293 238    Cardiac Enzymes:  Recent Labs Lab 10/20/13 1031 10/20/13 1830 10/21/13 0055  TROPONINI 0.35* <0.30 <0.30   BNP (last 3 results)  Recent Labs  11/12/12 2000 10/20/13 1031  PROBNP 818.8* 5795.0*   CBG:  Recent Labs Lab 10/20/13 1621 10/20/13 2135 10/21/13 0735 10/21/13 1224  GLUCAP 106* 211* 164* 122*    Recent Results (from the past 240 hour(s))  CULTURE, BLOOD (ROUTINE X 2)     Status: None   Collection Time    10/20/13 10:28 AM      Result Value Range Status   Specimen Description BLOOD LEFT ANTECUBITAL   Final   Special Requests BOTTLES DRAWN AEROBIC AND ANAEROBIC 5CC   Final   Culture  Setup Time     Final   Value: 10/20/2013 13:45     Performed at [facility]   Culture     Final   Value:        BLOOD CULTURE RECEIVED NO GROWTH TO DATE CULTURE WILL BE HELD FOR 5 DAYS BEFORE ISSUING A FINAL NEGATIVE REPORT     Performed at [facility]   Report Status PENDING   Incomplete  CULTURE, BLOOD (ROUTINE X 2)     Status: None   Collection Time    10/20/13 10:32 AM      Result Value Range Status   Specimen Description BLOOD RIGHT FOREARM   Final   Special Requests BOTTLES DRAWN AEROBIC AND ANAEROBIC 5CC   Final   Culture  Setup Time     Final   Value: 10/20/2013 13:46     Performed at [facility]   Culture     Final   Value:        BLOOD CULTURE RECEIVED NO GROWTH TO DATE CULTURE WILL BE HELD FOR 5 DAYS BEFORE ISSUING A FINAL NEGATIVE REPORT     Performed at [facility]   Report Status PENDING   Incomplete  URINE CULTURE     Status: None   Collection Time    10/20/13 10:48 AM      Result Value Range Status   Specimen Description URINE, CATHETERIZED   Final   Special Requests NONE   Final   Culture  Setup Time     Final   Value: 10/20/2013 11:09     Performed at [place]     Final   Value: >=100,000 COLONIES/ML     Performed at [facility]   Culture     Final   Value: Multiple  bacterial morphotypes present, none predominant. Suggest appropriate recollection if clinically indicated.     Performed at [facility]   Report Status 10/21/2013 FINAL   Final  MRSA PCR SCREENING     Status: None   Collection Time    10/20/13  3:02 PM      Result Value Range Status   MRSA by PCR NEGATIVE  NEGATIVE  Final   Comment:            The GeneXpert MRSA Assay (FDA     approved for NASAL specimens     only), is one component of a     comprehensive MRSA colonization     surveillance program. It is not     intended to diagnose MRSA     infection nor to guide or     monitor treatment for     MRSA infections.     Studies:  Recent x-ray studies have been reviewed in detail by the Attending Physician  Scheduled Meds:  Scheduled Meds: . [START ON 10/22/2013] ceFEPime (MAXIPIME) IV  2 g Intravenous Q24H  . docusate sodium  100 mg Oral BID  . insulin aspart  0-9 Units Subcutaneous Q6H  . metoCLOPramide (REGLAN) injection  5 mg Intravenous Q6H  . pantoprazole (PROTONIX) IV  40 mg Intravenous Q12H  . sodium chloride  3 mL Intravenous Q12H  . tamsulosin  0.4 mg Oral Daily  . venlafaxine XR  75 mg Oral Q breakfast   Continuous Infusions: . sodium chloride 125 mL/hr at 10/21/13 1500    Time spent on care of this patient: > 35 min   Debbe Odea, MD  [facility] Office  [phone] Pager - Text Page per Shea Evans as per below:  On-Call/Text Page:      [email or web address]      password [email or web address]  If 7PM-7AM, please contact night-coverage [email or web address] Password [email or web address] 10/21/2013, 4:16 PM   LOS: 1 day

## 2013-10-21 NOTE — Progress Notes (Signed)
10/21/13 0030  Vitals  BP 134/80 mmHg  MAP (mmHg) 91  Pulse Rate ! 145  ECG Heart Rate ! 144  Resp ! 31   Pt heart rate still up with Cardizem at 15, prn lopressor given will continue to monitor

## 2013-10-22 DIAGNOSIS — I359 Nonrheumatic aortic valve disorder, unspecified: Secondary | ICD-10-CM

## 2013-10-22 LAB — CBC
HCT: 28.7 % — ABNORMAL LOW (ref 39.0–52.0)
HCT: 29.1 % — ABNORMAL LOW (ref 39.0–52.0)
HCT: 29.3 % — ABNORMAL LOW (ref 39.0–52.0)
HEMATOCRIT: 32.4 % — AB (ref 39.0–52.0)
HEMOGLOBIN: 10.2 g/dL — AB (ref 13.0–17.0)
Hemoglobin: 9.1 g/dL — ABNORMAL LOW (ref 13.0–17.0)
Hemoglobin: 9.1 g/dL — ABNORMAL LOW (ref 13.0–17.0)
Hemoglobin: 9.3 g/dL — ABNORMAL LOW (ref 13.0–17.0)
MCH: 27.4 pg (ref 26.0–34.0)
MCH: 27.3 pg (ref 26.0–34.0)
MCH: 27.9 pg (ref 26.0–34.0)
MCH: 27.8 pg (ref 26.0–34.0)
MCHC: 31.7 g/dL (ref 30.0–36.0)
MCHC: 31.3 g/dL (ref 30.0–36.0)
MCHC: 31.7 g/dL (ref 30.0–36.0)
MCHC: 31.5 g/dL (ref 30.0–36.0)
MCV: 86.4 fL (ref 78.0–100.0)
MCV: 87.4 fL (ref 78.0–100.0)
MCV: 88 fL (ref 78.0–100.0)
MCV: 88.3 fL (ref 78.0–100.0)
PLATELETS: 224 10*3/uL (ref 150–400)
PLATELETS: 203 10*3/uL (ref 150–400)
PLATELETS: 225 10*3/uL (ref 150–400)
Platelets: 211 10*3/uL (ref 150–400)
RBC: 3.32 MIL/uL — ABNORMAL LOW (ref 4.22–5.81)
RBC: 3.33 MIL/uL — ABNORMAL LOW (ref 4.22–5.81)
RBC: 3.33 MIL/uL — AB (ref 4.22–5.81)
RBC: 3.67 MIL/uL — AB (ref 4.22–5.81)
RDW: 14.4 % (ref 11.5–15.5)
RDW: 14.1 % (ref 11.5–15.5)
RDW: 14 % (ref 11.5–15.5)
RDW: 13.9 % (ref 11.5–15.5)
WBC: 5.6 10*3/uL (ref 4.0–10.5)
WBC: 5.4 10*3/uL (ref 4.0–10.5)
WBC: 4.7 10*3/uL (ref 4.0–10.5)
WBC: 5.5 10*3/uL (ref 4.0–10.5)

## 2013-10-22 LAB — BASIC METABOLIC PANEL
BUN: 22 mg/dL (ref 6–23)
CALCIUM: 7.6 mg/dL — AB (ref 8.4–10.5)
CO2: 21 mEq/L (ref 19–32)
CREATININE: 1.36 mg/dL — AB (ref 0.50–1.35)
Chloride: 113 mEq/L — ABNORMAL HIGH (ref 96–112)
GFR calc Af Amer: 60 mL/min — ABNORMAL LOW (ref >90)
GFR calc non Af Amer: 52 mL/min — ABNORMAL LOW (ref >90)
Glucose, Bld: 105 mg/dL — ABNORMAL HIGH (ref 70–99)
Potassium: 4.3 mEq/L (ref 3.7–5.3)
Sodium: 145 mEq/L (ref 137–147)

## 2013-10-22 LAB — GLUCOSE, CAPILLARY
Glucose-Capillary: 102 mg/dL — ABNORMAL HIGH (ref 70–99)
Glucose-Capillary: 108 mg/dL — ABNORMAL HIGH (ref 70–99)
Glucose-Capillary: 137 mg/dL — ABNORMAL HIGH (ref 70–99)
Glucose-Capillary: 87 mg/dL (ref 70–99)

## 2013-10-22 MED ORDER — CLONIDINE HCL 0.2 MG PO TABS
0.2000 mg | ORAL_TABLET | Freq: Two times a day (BID) | ORAL | Status: DC
  Administered 2013-10-22 – 2013-10-25 (×6): 0.2 mg via ORAL
  Filled 2013-10-22 (×7): qty 1

## 2013-10-22 MED ORDER — INSULIN ASPART 100 UNIT/ML ~~LOC~~ SOLN: 0.0000 [IU] | Freq: Three times a day (TID) | SUBCUTANEOUS | Status: DC

## 2013-10-22 MED ORDER — DEXTROSE 5 % IV SOLN
1.0000 g | INTRAVENOUS | Status: DC
  Administered 2013-10-23 – 2013-10-25 (×3): 1 g via INTRAVENOUS
  Filled 2013-10-22 (×3): qty 10

## 2013-10-22 MED ORDER — INSULIN ASPART 100 UNIT/ML ~~LOC~~ SOLN
0.0000 [IU] | Freq: Three times a day (TID) | SUBCUTANEOUS | Status: DC
Start: 2013-10-22 — End: 2013-10-25
  Administered 2013-10-22: 1 [IU] via SUBCUTANEOUS
  Administered 2013-10-25: 2 [IU] via SUBCUTANEOUS

## 2013-10-22 MED ORDER — METOPROLOL SUCCINATE ER 100 MG PO TB24
200.0000 mg | ORAL_TABLET | Freq: Every day | ORAL | Status: DC
  Administered 2013-10-22 – 2013-10-25 (×4): 200 mg via ORAL
  Filled 2013-10-22 (×4): qty 2

## 2013-10-22 MED ORDER — PANTOPRAZOLE SODIUM 40 MG PO TBEC
40.0000 mg | DELAYED_RELEASE_TABLET | Freq: Two times a day (BID) | ORAL | Status: DC
  Administered 2013-10-22 – 2013-10-25 (×6): 40 mg via ORAL
  Filled 2013-10-22 (×5): qty 1

## 2013-10-22 NOTE — Progress Notes (Signed)
Clinical Social Work Department BRIEF PSYCHOSOCIAL ASSESSMENT 10/22/2013  Patient:  [name], [name]     Account Number:  [id number]     Admit date:  10/20/2013  Clinical Social Worker:  Freeman Caldron  Date/Time:  10/22/2013 01:03 PM  Referred by:  Physician  Date Referred:  10/22/2013 Referred for  SNF Placement   Other Referral:   Interview type:  Family Other interview type:    PSYCHOSOCIAL DATA Living Status:  FACILITY Admitted from facility:  [facility] Level of care:  [place] Primary support name:  [name] ([phone]) Primary support relationship to patient:  SIBLING Degree of support available:   Good--pt has lived at [facility] for the last 8 or 9 years, per brother.    CURRENT CONCERNS Current Concerns  Post-Acute Placement   Other Concerns:    SOCIAL WORK ASSESSMENT / PLAN This CSW completed assessment to assist unit CSW, who is experience a heavy caseload today. CSW called pt's brother, as pt disorented to place and time according to the chart. CSW explained role in discharge, and pt's brother expressed understanding. CSW asked brother to confirm pt is from [facility], and brother confirms and states they would like him to return to the room he was in prior to admission to hospital (room 235). CSW called [facility] and spoke with admissions, and they state this should not be a problem. CSW explained family can follow up with unit CSW for concerns/questions and that unit CSW will inform family when pt is discharging back to SNF. Pt's brother understanding of this and states he has no questions right now and will follow up at another time if needed. This CSW signing off and has provided handoff to unit CSW. FL2 is on the chart to be signed by MD.   Assessment/plan status:  Psychosocial Support/Ongoing Assessment of Needs Other assessment/ plan:   Information/referral to community resources:   SNF [facility] [place])     PATIENT'S/FAMILY'S RESPONSE TO PLAN OF CARE: Good--pt's brother friendly in phone conversation with CSW and explained he has no concerns/questions at this time. Family was interested in pt returning to the room he was in prior to hospitalization, and CSW has called facility to inform them; facility states this should not be a problem. Unit CSW to follow up with family when pt is ready for discharge.       Ky Barban, MSW, [facility] Clinical Social Worker [phone]

## 2013-10-22 NOTE — Progress Notes (Signed)
Progress Note [place] TEAM 1 - Stepdown/ICU TEAM   [name] CBJ:[id number] DOB: 1946-02-18 DOA: 10/20/2013 PCP: No primary provider on file.  Brief narrative: 68 y.o. male who presented w/ hematemesis. Patient is a resident of [facility], and has history of stroke, for which he is on clopidogrel, as well as dementia. Patient was sent to ED for complaints of hematemesis. Patient's nurse reported a few episodes of coffee ground emesis overnight. No reported melena or hematochezia. Patient denied abdominal pain. Patient had similar presentation about one year ago, had EGD by Dr. Michail Sermon in February 2014 (fairly normal) followed by EGD by Dr. Oletta Lamas in March 2014, showing severe ulcerative esophagitis, with biopsies showing non-specific inflammation. Patient did not appear to have been on PPI at his nursing facility.  Subjective: Denies any complaints.  Says he is hungry.  Denies any further vomiting.    Assessment/Plan:  Hematemesis - GI has evaluated and no plans to scope unless uncontrolled bleeding - per GI "start Protonix 40 mg po bid, and continue at this dose for 6 weeks, then decrease to 40 mg po qd, and continue at this dose indefinitely - will resume ASA and Plavix and follow   Anemia due to blood loss, acute - Hgb dropped from 13 > 9.1 - now actually climbing  - cont to follow  UTI w/ sepsis  - fever to Tm 101/ chronic foley - cont to hydrate  - f/u cultures- has grown only multiple morphotypes in the past - narrow to Rocephin only and follow clinically   Hypernatremia - resolved - follow   Chronic systolic CHF - last EF 16-07% 1/14 - currently remains dehydrated- (400 cc urine in past 10 hrs)  Hypotension with h/o HTN - Lactic acidosis - much improved - resume Lopressor and clonidine  - cont to hydrate  Tachycardia - Sinus  - aggressive hydration - resume B Blocker and clonidine  Dementia - stable - no behavioral disturbances - cont  [place]  Chronic kidney disease, stage III - Cr about baseline  Code Status: Full code - d/w patient's brother - he was going to talk with the patient's daughter and let us know if they changed their minds Family Communication: no family present at time of exam  Disposition Plan: SDU - eventual back to SNF  Consultants: GI  Procedures: None  Antibiotics: Cefepime 2/4 >> 2/6 Rocephin 2/6 >>  DVT prophylaxis: SCDs  Objective: Blood pressure 165/80, pulse 104, temperature 99.4 F (37.4 C), temperature source Oral, resp. rate 24, height 6' (1.829 m), weight 87.4 kg (192 lb 10.9 oz), SpO2 98.00%.  Intake/Output Summary (Last 24 hours) at 10/22/13 1414 Last data filed at 10/22/13 1000  Gross per 24 hour  Intake   2293 ml  Output   1025 ml  Net   1268 ml   Exam: General: No acute respiratory distress - alert, confused to time and place Lungs: Clear to auscultation bilaterally without wheezes or crackles Cardiovascular: Regular rhythm but tachy without murmur gallop or rub normal S1 and S2 Abdomen: Nontender, nondistended, soft, bowel sounds positive, no rebound, no ascites, no appreciable mass Extremities: No significant cyanosis, clubbing, or edema bilateral lower extremities  Data Reviewed: Basic Metabolic Panel:  Recent Labs Lab 10/20/13 1031 10/21/13 0055 10/22/13 0600  NA 149* 151* 145  K 4.1 4.3 4.3  CL 105 116* 113*  CO2 25 19 21   GLUCOSE 174* 174* 105*  BUN 42* 40* 22  CREATININE 1.85* 1.62* 1.36*  CALCIUM  10.1 8.5 7.6*   Liver Function Tests:  Recent Labs Lab 10/20/13 1031  AST 16  ALT 11  ALKPHOS 80  BILITOT <0.2*  PROT 7.9  ALBUMIN 3.5    Recent Labs Lab 10/20/13 1031  LIPASE 24   CBC:  Recent Labs Lab 10/20/13 1031  10/21/13 1500 10/21/13 2015 10/22/13 0123 10/22/13 0600 10/22/13 1022  WBC 13.9*  < > 7.9 6.6 5.6 5.4 4.7  NEUTROABS 11.0*  --   --   --   --   --   --   HGB 14.2  < > 10.5* 9.3* 9.1* 9.1* 9.3*  HCT 42.9  < >  33.5* 30.2* 28.7* 29.1* 29.3*  MCV 85.3  < > 89.3 89.1 86.4 87.4 88.0  PLT 465*  < > 238 246 224 203 211  < > = values in this interval not displayed.  Cardiac Enzymes:  Recent Labs Lab 10/20/13 1031 10/20/13 1830 10/21/13 0055  TROPONINI 0.35* <0.30 <0.30   BNP (last 3 results)  Recent Labs  11/12/12 2000 10/20/13 1031  PROBNP 818.8* 5795.0*   CBG:  Recent Labs Lab 10/21/13 1224 10/21/13 1620 10/21/13 2136 10/22/13 0805 10/22/13 1203  GLUCAP 122* 126* 122* 102* 108*    Recent Results (from the past 240 hour(s))  CULTURE, BLOOD (ROUTINE X 2)     Status: None   Collection Time    10/20/13 10:28 AM      Result Value Range Status   Specimen Description BLOOD LEFT ANTECUBITAL   Final   Special Requests BOTTLES DRAWN AEROBIC AND ANAEROBIC 5CC   Final   Culture  Setup Time     Final   Value: 10/20/2013 13:45     Performed at [facility]   Culture     Final   Value:        BLOOD CULTURE RECEIVED NO GROWTH TO DATE CULTURE WILL BE HELD FOR 5 DAYS BEFORE ISSUING A FINAL NEGATIVE REPORT     Performed at [facility]   Report Status PENDING   Incomplete  CULTURE, BLOOD (ROUTINE X 2)     Status: None   Collection Time    10/20/13 10:32 AM      Result Value Range Status   Specimen Description BLOOD RIGHT FOREARM   Final   Special Requests BOTTLES DRAWN AEROBIC AND ANAEROBIC 5CC   Final   Culture  Setup Time     Final   Value: 10/20/2013 13:46     Performed at [facility]   Culture     Final   Value:        BLOOD CULTURE RECEIVED NO GROWTH TO DATE CULTURE WILL BE HELD FOR 5 DAYS BEFORE ISSUING A FINAL NEGATIVE REPORT     Performed at [facility]   Report Status PENDING   Incomplete  URINE CULTURE     Status: None   Collection Time    10/20/13 10:48 AM      Result Value Range Status   Specimen Description URINE, CATHETERIZED   Final   Special Requests NONE   Final   Culture  Setup Time     Final   Value: 10/20/2013 11:09      Performed at [place]     Final   Value: >=100,000 COLONIES/ML     Performed at [facility]   Culture     Final   Value: Multiple bacterial morphotypes present, none predominant. Suggest  appropriate recollection if clinically indicated.     Performed at [facility]   Report Status 10/21/2013 FINAL   Final  MRSA PCR SCREENING     Status: None   Collection Time    10/20/13  3:02 PM      Result Value Range Status   MRSA by PCR NEGATIVE  NEGATIVE Final   Comment:            The GeneXpert MRSA Assay (FDA     approved for NASAL specimens     only), is one component of a     comprehensive MRSA colonization     surveillance program. It is not     intended to diagnose MRSA     infection nor to guide or     monitor treatment for     MRSA infections.    Studies:  Recent x-ray studies have been reviewed in detail by the Attending Physician  Scheduled Meds:  Scheduled Meds: . ceFEPime (MAXIPIME) IV  2 g Intravenous Q24H  . docusate sodium  100 mg Oral BID  . insulin aspart  0-5 Units Subcutaneous QHS  . insulin aspart  0-9 Units Subcutaneous TID WC  . memantine  10 mg Oral BID  . metoCLOPramide (REGLAN) injection  5 mg Intravenous Q6H  . metoprolol tartrate  12.5 mg Oral BID  . pantoprazole  40 mg Oral BID AC  . sodium chloride  3 mL Intravenous Q12H  . tamsulosin  0.4 mg Oral Daily  . venlafaxine XR  75 mg Oral Q breakfast   Time spent on care of this patient: 27 min   Gregg Winchell T, MD  [facility] Office  [phone] Pager - Text Page per Shea Evans as per below:  On-Call/Text Page:      [email or web address]      password [email or web address]  If 7PM-7AM, please contact night-coverage [email or web address] Password [email or web address] 10/22/2013, 2:14 PM   LOS: 2 days

## 2013-10-22 NOTE — Progress Notes (Signed)
Subjective: Eating well. No further emesis.   No bloody emesis. No blood in stool.  Objective: Vital signs in last 24 hours: Temp:  [98.3 F (36.8 C)-99.6 F (37.6 C)] 98.8 F (37.1 C) (02/06 0800) Pulse Rate:  [103-116] 108 (02/06 0800) Resp:  [18-26] 20 (02/06 0800) BP: (126-135)/(43-60) 135/60 mmHg (02/06 0800) SpO2:  [97 %-100 %] 99 % (02/06 0800) Weight change:    PE: GEN:  NAD ABD:  Soft  Lab Results: CBC    Component Value Date/Time   WBC 4.7 10/22/2013 1022   RBC 3.33* 10/22/2013 1022   HGB 9.3* 10/22/2013 1022   HCT 29.3* 10/22/2013 1022   PLT 211 10/22/2013 1022   MCV 88.0 10/22/2013 1022   MCH 27.9 10/22/2013 1022   MCHC 31.7 10/22/2013 1022   RDW 14.0 10/22/2013 1022   LYMPHSABS 1.4 10/20/2013 1031   MONOABS 1.5* 10/20/2013 1031   EOSABS 0.0 10/20/2013 1031   BASOSABS 0.0 10/20/2013 1031   Assessment:  1.  Coffee ground emesis.  Resolved.  Suspect reflux esophagitis-related. 2.  Anemia.  Worsening over past 24 hours is likely dilutional from fluids.  No evidence of overt GI blood loss.  Plan:  1.  Stop IV PPI. 2.  Start Protonix 40 mg po bid, and continue at this dose for 6 weeks, then decrease to 40 mg po qd, and continue at this dose indefinitely. 3.  Keep HOB elevated at least 30 degrees. 4.  Small, frequent meals. 5.  Advance diet as tolerated. 6.  Don't anticipate any need for any further GI testing.  Will sign-off; please call with questions; thank you for the consult.    Landry Dyke 10/22/2013, 10:54 AM

## 2013-10-22 NOTE — Progress Notes (Signed)
Echocardiogram 2D Echocardiogram has been performed.  [name] 10/22/2013, 11:52 AM

## 2013-10-23 LAB — BASIC METABOLIC PANEL
BUN: 14 mg/dL (ref 6–23)
CALCIUM: 7.8 mg/dL — AB (ref 8.4–10.5)
CO2: 21 mEq/L (ref 19–32)
Chloride: 110 mEq/L (ref 96–112)
Creatinine, Ser: 1.21 mg/dL (ref 0.50–1.35)
GFR, EST AFRICAN AMERICAN: 69 mL/min — AB (ref >90)
GFR, EST NON AFRICAN AMERICAN: 60 mL/min — AB (ref >90)
Glucose, Bld: 106 mg/dL — ABNORMAL HIGH (ref 70–99)
Potassium: 4.1 mEq/L (ref 3.7–5.3)
SODIUM: 143 meq/L (ref 137–147)

## 2013-10-23 LAB — GLUCOSE, CAPILLARY
Glucose-Capillary: 91 mg/dL (ref 70–99)
Glucose-Capillary: 123 mg/dL — ABNORMAL HIGH (ref 70–99)
Glucose-Capillary: 82 mg/dL (ref 70–99)
Glucose-Capillary: 90 mg/dL (ref 70–99)

## 2013-10-23 LAB — CBC
HEMATOCRIT: 26.9 % — AB (ref 39.0–52.0)
HEMOGLOBIN: 8.5 g/dL — AB (ref 13.0–17.0)
MCH: 27.2 pg (ref 26.0–34.0)
MCHC: 31.6 g/dL (ref 30.0–36.0)
MCV: 86.2 fL (ref 78.0–100.0)
Platelets: 226 10*3/uL (ref 150–400)
RBC: 3.12 MIL/uL — AB (ref 4.22–5.81)
RDW: 13.5 % (ref 11.5–15.5)
WBC: 4.5 10*3/uL (ref 4.0–10.5)

## 2013-10-23 MED ORDER — METOCLOPRAMIDE HCL 5 MG/ML IJ SOLN
10.0000 mg | Freq: Four times a day (QID) | INTRAMUSCULAR | Status: DC
  Administered 2013-10-23 – 2013-10-25 (×8): 10 mg via INTRAVENOUS
  Filled 2013-10-23 (×11): qty 2

## 2013-10-23 MED ORDER — ASPIRIN 81 MG PO TABS: 81.0000 mg | ORAL_TABLET | Freq: Every day | ORAL | Status: DC

## 2013-10-23 MED ORDER — CLOPIDOGREL BISULFATE 75 MG PO TABS: 75.0000 mg | ORAL_TABLET | Freq: Every day | ORAL | Status: DC

## 2013-10-23 NOTE — Progress Notes (Signed)
Progress Note [facility] TEAM 1 - Stepdown/ICU TEAM   CHESLEY VEASEY TFT:[id number] DOB: 12/25/1945 DOA: 10/20/2013 PCP: No primary provider on file.  Brief narrative: 68 y.o. male who presented w/ hematemesis. Patient is a resident of [facility], and has history of stroke, for which he is on clopidogrel, as well as dementia. Patient was sent to ED for complaints of hematemesis. Patient's nurse reported a few episodes of coffee ground emesis overnight. No reported melena or hematochezia. Patient denied abdominal pain. Patient had similar presentation about one year ago, had EGD by Dr. Michail Sermon in February 2014 (fairly normal) followed by EGD by Dr. Oletta Lamas in March 2014, showing severe ulcerative esophagitis, with biopsies showing non-specific inflammation. Patient did not appear to have been on PPI at his nursing facility.  Subjective: Pt himself denies any complaints. RN reports that he is vomiting after attempts to eat.  Had a large breakfast, and then 30-19min later vomited partially digested breakfast.  No hematemesis.  Seems to be a recurring issue associated w/ eating.    Assessment/Plan:  Hematemesis - GI has evaluated and no plans to scope unless uncontrolled bleeding noted  - per GI "start Protonix 40 mg po bid, and continue at this dose for 6 weeks, then decrease to 40 mg po qd, and continue at this dose indefinitely - will resume ASA and Plavix and follow   Vomiting after meals ?severe gastroparesis - could order nuc med gastric emptying scan, but would have to wait until Mond - will give trial of max dose reglan and follow sx - had relatively recent EGDs  Anemia due to blood loss, acute - Hgb dropped over night, w/o obvious evidence of blood loss - likely dilutional - recheck in AM   UTI w/ sepsis  - fever to Tm 101 - cultures has grown only multiple morphotypes - will plan empiric 7 day course of abx  Hypernatremia - resolved   Chronic systolic CHF - last EF 70-62%  1/14 - slow IVF and follow   Hypotension - Lactic acidosis - resolved  Tachycardia - Sinus  - improved after resumed B Blocker and clonidine + fluid  Dementia - stable - no behavioral disturbances - cont Namenda  Chronic kidney disease, stage III - Cr about baseline  Code Status: FULL Family Communication: no family present at time of exam  Disposition Plan: SDU - eventual back to SNF  Consultants: GI  Procedures: None  Antibiotics: Cefepime 2/4 >> 2/6 Rocephin 2/6 >>  DVT prophylaxis: SCDs  Objective: Blood pressure 139/63, pulse 91, temperature 98.3 F (36.8 C), temperature source Oral, resp. rate 15, height 6' (1.829 m), weight 87.4 kg (192 lb 10.9 oz), SpO2 98.00%.  Intake/Output Summary (Last 24 hours) at 10/23/13 1506 Last data filed at 10/23/13 1149  Gross per 24 hour  Intake   1250 ml  Output   2300 ml  Net  -1050 ml   Exam: General: No acute respiratory distress Lungs: Clear to auscultation bilaterally without wheezes or crackles Cardiovascular: Regular rate and rhythm without murmur gallop or rub normal S1 and S2 Abdomen: Nontender, nondistended, soft, bowel sounds positive, no rebound, no ascites, no appreciable mass Extremities: No significant cyanosis, clubbing, or edema bilateral lower extremities  Data Reviewed: Basic Metabolic Panel:  Recent Labs Lab 10/20/13 1031 10/21/13 0055 10/22/13 0600 10/23/13 0243  NA 149* 151* 145 143  K 4.1 4.3 4.3 4.1  CL 105 116* 113* 110  CO2 25 19 21 21   GLUCOSE 174* 174*  105* 106*  BUN 42* 40* 22 14  CREATININE 1.85* 1.62* 1.36* 1.21  CALCIUM 10.1 8.5 7.6* 7.8*   Liver Function Tests:  Recent Labs Lab 10/20/13 1031  AST 16  ALT 11  ALKPHOS 80  BILITOT <0.2*  PROT 7.9  ALBUMIN 3.5    Recent Labs Lab 10/20/13 1031  LIPASE 24   CBC:  Recent Labs Lab 10/20/13 1031  10/22/13 0123 10/22/13 0600 10/22/13 1022 10/22/13 1410 10/23/13 0243  WBC 13.9*  < > 5.6 5.4 4.7 5.5 4.5    NEUTROABS 11.0*  --   --   --   --   --   --   HGB 14.2  < > 9.1* 9.1* 9.3* 10.2* 8.5*  HCT 42.9  < > 28.7* 29.1* 29.3* 32.4* 26.9*  MCV 85.3  < > 86.4 87.4 88.0 88.3 86.2  PLT 465*  < > 224 203 211 225 226  < > = values in this interval not displayed.  Cardiac Enzymes:  Recent Labs Lab 10/20/13 1031 10/20/13 1830 10/21/13 0055  TROPONINI 0.35* <0.30 <0.30   BNP (last 3 results)  Recent Labs  11/12/12 2000 10/20/13 1031  PROBNP 818.8* 5795.0*   CBG:  Recent Labs Lab 10/22/13 1203 10/22/13 1737 10/22/13 2207 10/23/13 0811 10/23/13 1119  GLUCAP 108* 137* 87 91 123*    Recent Results (from the past 240 hour(s))  CULTURE, BLOOD (ROUTINE X 2)     Status: None   Collection Time    10/20/13 10:28 AM      Result Value Range Status   Specimen Description BLOOD LEFT ANTECUBITAL   Final   Special Requests BOTTLES DRAWN AEROBIC AND ANAEROBIC 5CC   Final   Culture  Setup Time     Final   Value: 10/20/2013 13:45     Performed at [facility]   Culture     Final   Value:        BLOOD CULTURE RECEIVED NO GROWTH TO DATE CULTURE WILL BE HELD FOR 5 DAYS BEFORE ISSUING A FINAL NEGATIVE REPORT     Performed at [facility]   Report Status PENDING   Incomplete  CULTURE, BLOOD (ROUTINE X 2)     Status: None   Collection Time    10/20/13 10:32 AM      Result Value Range Status   Specimen Description BLOOD RIGHT FOREARM   Final   Special Requests BOTTLES DRAWN AEROBIC AND ANAEROBIC 5CC   Final   Culture  Setup Time     Final   Value: 10/20/2013 13:46     Performed at [facility]   Culture     Final   Value:        BLOOD CULTURE RECEIVED NO GROWTH TO DATE CULTURE WILL BE HELD FOR 5 DAYS BEFORE ISSUING A FINAL NEGATIVE REPORT     Performed at [facility]   Report Status PENDING   Incomplete  URINE CULTURE     Status: None   Collection Time    10/20/13 10:48 AM      Result Value Range Status   Specimen Description URINE, CATHETERIZED   Final    Special Requests NONE   Final   Culture  Setup Time     Final   Value: 10/20/2013 11:09     Performed at [facility]     Final   Value: >=100,000 COLONIES/ML     Performed at [facility]  Culture     Final   Value: Multiple bacterial morphotypes present, none predominant. Suggest appropriate recollection if clinically indicated.     Performed at [facility]   Report Status 10/21/2013 FINAL   Final  MRSA PCR SCREENING     Status: None   Collection Time    10/20/13  3:02 PM      Result Value Range Status   MRSA by PCR NEGATIVE  NEGATIVE Final   Comment:            The GeneXpert MRSA Assay (FDA     approved for NASAL specimens     only), is one component of a     comprehensive MRSA colonization     surveillance program. It is not     intended to diagnose MRSA     infection nor to guide or     monitor treatment for     MRSA infections.    Studies:  Recent x-ray studies have been reviewed in detail by the Attending Physician  Scheduled Meds:  Scheduled Meds: . cefTRIAXone (ROCEPHIN)  IV  1 g Intravenous Q24H  . cloNIDine  0.2 mg Oral BID  . docusate sodium  100 mg Oral BID  . insulin aspart  0-5 Units Subcutaneous QHS  . insulin aspart  0-9 Units Subcutaneous TID WC  . memantine  10 mg Oral BID  . metoCLOPramide (REGLAN) injection  5 mg Intravenous Q6H  . metoprolol succinate  200 mg Oral Daily  . pantoprazole  40 mg Oral BID AC  . sodium chloride  3 mL Intravenous Q12H  . tamsulosin  0.4 mg Oral Daily  . venlafaxine XR  75 mg Oral Q breakfast   Time spent on care of this patient: 49 min   Laisha Rau T, MD  [facility] Office  [phone] Pager - Text Page per Shea Evans as per below:  On-Call/Text Page:      [email or web address]      password [email or web address]  If 7PM-7AM, please contact night-coverage [email or web address] Password [email or web address] 10/23/2013, 3:06 PM   LOS: 3 days

## 2013-10-24 LAB — BASIC METABOLIC PANEL
BUN: 13 mg/dL (ref 6–23)
CHLORIDE: 109 meq/L (ref 96–112)
CO2: 21 meq/L (ref 19–32)
CREATININE: 1.26 mg/dL (ref 0.50–1.35)
Calcium: 8 mg/dL — ABNORMAL LOW (ref 8.4–10.5)
GFR calc non Af Amer: 57 mL/min — ABNORMAL LOW (ref >90)
GFR, EST AFRICAN AMERICAN: 66 mL/min — AB (ref >90)
Glucose, Bld: 90 mg/dL (ref 70–99)
POTASSIUM: 4.3 meq/L (ref 3.7–5.3)
Sodium: 141 mEq/L (ref 137–147)

## 2013-10-24 LAB — GLUCOSE, CAPILLARY
Glucose-Capillary: 92 mg/dL (ref 70–99)
Glucose-Capillary: 121 mg/dL — ABNORMAL HIGH (ref 70–99)
Glucose-Capillary: 87 mg/dL (ref 70–99)
Glucose-Capillary: 96 mg/dL (ref 70–99)

## 2013-10-24 LAB — CBC
HEMATOCRIT: 25.3 % — AB (ref 39.0–52.0)
Hemoglobin: 8.1 g/dL — ABNORMAL LOW (ref 13.0–17.0)
MCH: 27.5 pg (ref 26.0–34.0)
MCHC: 32 g/dL (ref 30.0–36.0)
MCV: 85.8 fL (ref 78.0–100.0)
Platelets: 211 10*3/uL (ref 150–400)
RBC: 2.95 MIL/uL — ABNORMAL LOW (ref 4.22–5.81)
RDW: 13.6 % (ref 11.5–15.5)
WBC: 3.9 10*3/uL — AB (ref 4.0–10.5)

## 2013-10-24 NOTE — Progress Notes (Signed)
Progress Note [facility] TEAM 1 - Stepdown/ICU TEAM   GILMORE TSUDA [id number] DOB: 1945/10/30 DOA: 10/20/2013 PCP: No primary provider on file.  Brief narrative: 68 y.o. male who presented w/ hematemesis. Patient is a resident of [facility], and has history of stroke, for which he is on clopidogrel, as well as dementia. Patient was sent to ED for complaints of hematemesis. Patient's nurse reported a few episodes of coffee ground emesis overnight. No reported melena or hematochezia. Patient denied abdominal pain. Patient had similar presentation about one year ago, had EGD by Dr. Michail Sermon in February 2014 (fairly normal) followed by EGD by Dr. Oletta Lamas in March 2014, showing severe ulcerative esophagitis, with biopsies showing non-specific inflammation. Patient did not appear to have been on PPI at his nursing facility.  Subjective: Pt is alert and conversant.  He has been able to tolerate meals today w/o vomiting as long as the meal is fed to him slowly, with pauses th/o.    Assessment/Plan:  Hematemesis - GI evaluated and no plans to scope unless uncontrolled bleeding noted  - per GI "start Protonix 40 mg po bid, and continue at this dose for 6 weeks, then decrease to 40 mg po qd, and continue at this dose indefinitely - will resume ASA and Plavix today   Vomiting after meals ?severe gastroparesis - could order nuc med gastric emptying scan, but pt already on low dose reglan, wch would make results of questionable validity - with trial of max dose reglan and slow feeding sx have resolved - had relatively recent EGDs (witihn the last year)  Anemia due to blood loss, acute - Hgb appears to be stabilizing - follow   UTI w/ sepsis  - fever to Tm 101 - cultures has grown only multiple morphotypes - will plan empiric 7 day course of abx  Hypernatremia - resolved   Chronic systolic CHF - last EF 123456 1/14 - well compensated at this time   Hypotension - Lactic acidosis -  resolved  Tachycardia - Sinus  - resolved after resumed B Blocker and clonidine + fluid  Dementia - stable - no behavioral disturbances - cont Namenda  Chronic kidney disease, stage III - Cr about baseline  Code Status: FULL Family Communication: no family present at time of exam  Disposition Plan: for transfer back to SNF in AM if Hgb stable   Consultants: GI  Procedures: None  Antibiotics: Cefepime 2/4 >> 2/6 Rocephin 2/6 >>  DVT prophylaxis: SCDs  Objective: Blood pressure 111/54, pulse 81, temperature 98.2 F (36.8 C), temperature source Oral, resp. rate 16, height 6' (1.829 m), weight 87.4 kg (192 lb 10.9 oz), SpO2 99.00%.  Intake/Output Summary (Last 24 hours) at 10/24/13 1516 Last data filed at 10/24/13 1125  Gross per 24 hour  Intake    675 ml  Output    900 ml  Net   -225 ml   Exam: General: No acute respiratory distress Lungs: Clear to auscultation bilaterally without wheezes or crackles Cardiovascular: Regular rate and rhythm without murmur gallop or rub  Abdomen: Nontender, nondistended, soft, bowel sounds positive, no rebound, no ascites, no appreciable mass Extremities: No significant cyanosis, clubbing, or edema bilateral lower extremities  Data Reviewed: Basic Metabolic Panel:  Recent Labs Lab 10/20/13 1031 10/21/13 0055 10/22/13 0600 10/23/13 0243 10/24/13 0308  NA 149* 151* 145 143 141  K 4.1 4.3 4.3 4.1 4.3  CL 105 116* 113* 110 109  CO2 25 19 21 21 21   GLUCOSE 174*  174* 105* 106* 90  BUN 42* 40* 22 14 13   CREATININE 1.85* 1.62* 1.36* 1.21 1.26  CALCIUM 10.1 8.5 7.6* 7.8* 8.0*   Liver Function Tests:  Recent Labs Lab 10/20/13 1031  AST 16  ALT 11  ALKPHOS 80  BILITOT <0.2*  PROT 7.9  ALBUMIN 3.5    Recent Labs Lab 10/20/13 1031  LIPASE 24   CBC:  Recent Labs Lab 10/20/13 1031  10/22/13 0600 10/22/13 1022 10/22/13 1410 10/23/13 0243 10/24/13 0308  WBC 13.9*  < > 5.4 4.7 5.5 4.5 3.9*  NEUTROABS 11.0*  --    --   --   --   --   --   HGB 14.2  < > 9.1* 9.3* 10.2* 8.5* 8.1*  HCT 42.9  < > 29.1* 29.3* 32.4* 26.9* 25.3*  MCV 85.3  < > 87.4 88.0 88.3 86.2 85.8  PLT 465*  < > 203 211 225 226 211  < > = values in this interval not displayed.  Cardiac Enzymes:  Recent Labs Lab 10/20/13 1031 10/20/13 1830 10/21/13 0055  TROPONINI 0.35* <0.30 <0.30   BNP (last 3 results)  Recent Labs  11/12/12 2000 10/20/13 1031  PROBNP 818.8* 5795.0*   CBG:  Recent Labs Lab 10/23/13 1119 10/23/13 1710 10/23/13 2131 10/24/13 0753 10/24/13 1203  GLUCAP 123* 82 90 92 121*    Recent Results (from the past 240 hour(s))  CULTURE, BLOOD (ROUTINE X 2)     Status: None   Collection Time    10/20/13 10:28 AM      Result Value Range Status   Specimen Description BLOOD LEFT ANTECUBITAL   Final   Special Requests BOTTLES DRAWN AEROBIC AND ANAEROBIC 5CC   Final   Culture  Setup Time     Final   Value: 10/20/2013 13:45     Performed at [facility]   Culture     Final   Value:        BLOOD CULTURE RECEIVED NO GROWTH TO DATE CULTURE WILL BE HELD FOR 5 DAYS BEFORE ISSUING A FINAL NEGATIVE REPORT     Performed at [facility]   Report Status PENDING   Incomplete  CULTURE, BLOOD (ROUTINE X 2)     Status: None   Collection Time    10/20/13 10:32 AM      Result Value Range Status   Specimen Description BLOOD RIGHT FOREARM   Final   Special Requests BOTTLES DRAWN AEROBIC AND ANAEROBIC 5CC   Final   Culture  Setup Time     Final   Value: 10/20/2013 13:46     Performed at [facility]   Culture     Final   Value:        BLOOD CULTURE RECEIVED NO GROWTH TO DATE CULTURE WILL BE HELD FOR 5 DAYS BEFORE ISSUING A FINAL NEGATIVE REPORT     Performed at [facility]   Report Status PENDING   Incomplete  URINE CULTURE     Status: None   Collection Time    10/20/13 10:48 AM      Result Value Range Status   Specimen Description URINE, CATHETERIZED   Final   Special Requests NONE    Final   Culture  Setup Time     Final   Value: 10/20/2013 11:09     Performed at [facility]     Final   Value: >=100,000 COLONIES/ML     Performed  at [facility]     Final   Value: Multiple bacterial morphotypes present, none predominant. Suggest appropriate recollection if clinically indicated.     Performed at [facility]   Report Status 10/21/2013 FINAL   Final  MRSA PCR SCREENING     Status: None   Collection Time    10/20/13  3:02 PM      Result Value Range Status   MRSA by PCR NEGATIVE  NEGATIVE Final   Comment:            The GeneXpert MRSA Assay (FDA     approved for NASAL specimens     only), is one component of a     comprehensive MRSA colonization     surveillance program. It is not     intended to diagnose MRSA     infection nor to guide or     monitor treatment for     MRSA infections.    Studies:  Recent x-ray studies have been reviewed in detail by the Attending Physician  Scheduled Meds:  Scheduled Meds: . cefTRIAXone (ROCEPHIN)  IV  1 g Intravenous Q24H  . cloNIDine  0.2 mg Oral BID  . docusate sodium  100 mg Oral BID  . insulin aspart  0-5 Units Subcutaneous QHS  . insulin aspart  0-9 Units Subcutaneous TID WC  . memantine  10 mg Oral BID  . metoCLOPramide (REGLAN) injection  10 mg Intravenous Q6H  . metoprolol succinate  200 mg Oral Daily  . pantoprazole  40 mg Oral BID AC  . sodium chloride  3 mL Intravenous Q12H  . tamsulosin  0.4 mg Oral Daily  . venlafaxine XR  75 mg Oral Q breakfast   Time spent on care of this patient: 25 min   MCCLUNG,JEFFREY T, MD  [facility] Office  [phone] Pager - Text Page per Shea Evans as per below:  On-Call/Text Page:      [email or web address]      password [email or web address]  If 7PM-7AM, please contact night-coverage [email or web address] Password [email or web address] 10/24/2013, 3:16 PM   LOS: 4 days

## 2013-10-24 NOTE — Progress Notes (Signed)
Patient ID: [name], male   DOB: June 26, 1946, 68 y.o.   MRN: [id number]         PROGRESS NOTE  DATE:  10-19-13  FACILITY: [facility]   LEVEL OF CARE: SNF  Routine Visit  CHIEF COMPLAINT:  Manage hyperlipidemia, dementia and renovascular hypertension.    HISTORY OF PRESENT ILLNESS:  REASSESSMENT OF ONGOING PROBLEM(S):  HYPERLIPIDEMIA: No complications from the medications presently being used. Last fasting lipid panel showed :   In 07/2012, fasting lipid panel normal, in 5/14 fasting lipid panel normal, in 11-14 HDL 38 otherwise fasting lipid panel normal.  HTN: Pt 's HTN remains stable.  Denies CP, sob, DOE, pedal edema, headaches, dizziness or visual disturbances.  No complications from the medications currently being used.  Last BP : 150/90,  160/90, 145/79, 100/56, 150/84,158/88, 150/84, 113/85, 130/70, 130/72, 132/66, 130/78, 138/80.  DEMENTIA: The dementia remaines stable and continues to function adequately in the current living environment with supervision.  The patient has had little changes in behavior. No complications noted from the medications presently being used.  PAST MEDICAL HISTORY : Reviewed.  No changes.  CURRENT MEDICATIONS: Reviewed per [facility]  REVIEW OF SYSTEMS:  GENERAL: no change in appetite, no fatigue, no weight changes, no fever, chills or weakness RESPIRATORY: no cough, SOB, DOE, wheezing, hemoptysis CARDIAC: no chest pain, edema or palpitations GI: no abdominal pain, diarrhea, constipation, heart burn, nausea or vomiting  PHYSICAL EXAMINATION  VS:  T 97.8      P 80     RR 18    BP 138/80      WT (Lb) 205  GENERAL: no acute distress, normal body habitus NECK: supple, trachea midline, no neck masses, no thyroid tenderness, no thyromegaly RESPIRATORY: breathing is even & unlabored, BS CTAB CARDIAC: RRR, no murmur,no extra heart sounds, no edema GI: abdomen soft, normal BS, no masses, no tenderness, no hepatomegaly, no splenomegaly PSYCHIATRIC:  the patient is alert & oriented to person, affect & behavior appropriate  LABS/RADIOLOGY:  1/15 bmp nl, Hb 10.7, mcv 87 ow cbc nl 11-14 hemoglobin 12.5, MCV 84 otherwise CBC normal, glucose 105, creatinine 1.42, total protein 5.8, albumin 3.5 otherwise CMP normal  8-14 liver profile normal  7/14 Hb 9, mcv 66 ow cbc nl 6/14 hemoglobin 8, MCV 66, platelets 423, WBC 6 point 8, TIBC 2:30, serum iron 14% saturation 6, ferritin 41, hemoglobin A1c 6.8   5/14 WBC 11, hemoglobin 8.8, MCV 68, platelets 385, glucose 122, BUN 28, creatinine 1.69, CO2 18 11/2012:  Hemoglobin 11.4, MCV 83, platelets 388, WBC 6.7.    Glucose 127, creatinine 1.77, otherwise BMP normal.    07/2012:  Liver profile normal.  ASSESSMENT/PLAN:  Renovascular hypertension- improved.  Hyperlipidemia.  Well controlled.   Dementia.  Continue current medications.   Chronic kidney disease.   Renal function nl  BPH.  Continue Flomax.   GERD.  Continue Prilosec.    Anemia of chronic kidney disease-stable.  CPT CODE: 19622

## 2013-10-24 NOTE — Clinical Social Work Note (Signed)
CSW faxed clinicals to [facility].  Liz Beach, [place], [place], [phone]

## 2013-10-25 LAB — CBC
HEMATOCRIT: 25.6 % — AB (ref 39.0–52.0)
Hemoglobin: 8.2 g/dL — ABNORMAL LOW (ref 13.0–17.0)
MCH: 27 pg (ref 26.0–34.0)
MCHC: 32 g/dL (ref 30.0–36.0)
MCV: 84.2 fL (ref 78.0–100.0)
PLATELETS: 237 10*3/uL (ref 150–400)
RBC: 3.04 MIL/uL — ABNORMAL LOW (ref 4.22–5.81)
RDW: 13.2 % (ref 11.5–15.5)
WBC: 5 10*3/uL (ref 4.0–10.5)

## 2013-10-25 LAB — GLUCOSE, CAPILLARY
GLUCOSE-CAPILLARY: 166 mg/dL — AB (ref 70–99)
Glucose-Capillary: 99 mg/dL (ref 70–99)

## 2013-10-25 MED ORDER — CEFUROXIME AXETIL 500 MG PO TABS: 500.0000 mg | ORAL_TABLET | Freq: Two times a day (BID) | ORAL | Status: AC

## 2013-10-25 MED ORDER — METOCLOPRAMIDE HCL 10 MG PO TABS: 10.0000 mg | ORAL_TABLET | Freq: Three times a day (TID) | ORAL | Status: DC

## 2013-10-25 MED ORDER — PANTOPRAZOLE SODIUM 40 MG PO TBEC: 40.0000 mg | DELAYED_RELEASE_TABLET | Freq: Two times a day (BID) | ORAL | Status: AC

## 2013-10-25 NOTE — Progress Notes (Signed)
Pt being d/c back to [facility] via [facility] with belongings. Discharge instruction and discharge packet to be given to rn at [facility] with discharge instruction, d/c summary of md, fl2,

## 2013-10-25 NOTE — Clinical Social Work Note (Signed)
Per Md patient ready to return to [facility]. RN, facility, and niece informed of DC. RN given number for report. RN calling for Ambulance transport. DC packet left on chart. CSW signing off.  Liz Beach, [place], [place], [phone]

## 2013-10-25 NOTE — Discharge Summary (Signed)
DISCHARGE SUMMARY  [name]  MR#: [id number]  DOB:1946-09-11  Date of Admission: 10/20/2013 Date of Discharge: 10/25/2013  Attending Physician:MCCLUNG,JEFFREY T  Patient's PCP:No primary provider on file.  Consults: GI - no procedures   Disposition: return to SNF   Follow-up Appts:     Follow-up Information   Follow up with Your ongoing care will be provided by the doctor at your SNF facility.  .     Tests Needing Follow-up: -) CBC should be rechecked in 5 days -) BP should be followed to determine if adjustment of medications is indicated  -) assistance with all meals should be provided, taking care to allow plenty of time between bites and pausing mid meal for 10-15 minutes to help prevent regurgitation   Discharge Diagnoses: Hematemesis  Vomiting after meals  Anemia due to blood loss, acute  UTI w/ sepsis  Hypernatremia  Chronic systolic CHF  Hypotension - Lactic acidosis  Tachycardia - Sinus  Dementia  Chronic kidney disease, stage III   Initial presentation: 68 y.o. male who presented w/ hematemesis. Patient is a resident of [facility], and has history of stroke, for which he is on clopidogrel, as well as dementia. Patient was sent to ED for complaints of hematemesis. Patient's nurse reported a few episodes of coffee ground emesis overnight. No reported melena or hematochezia. Patient denied abdominal pain. Patient had similar presentation about one year ago, had EGD by Dr. Michail Sermon in February 2014 (fairly normal) followed by EGD by Dr. Oletta Lamas in March 2014, showing severe ulcerative esophagitis, with biopsies showing non-specific inflammation. Patient did not appear to have been on PPI at his nursing facility.   Hospital Course:  Hematemesis  - GI evaluated - no procedure required as bleeding spontaneously ceased/did not return  - per GI "start Protonix 40 mg po bid, and continue at this dose for 6 weeks, then decrease to 40 mg po qd, and continue at  this dose indefinitely  - resumed ASA and Plavix at time of d/c   Vomiting after meals  Likely due to severe gastroparesis - did not order nuc med gastric emptying scan as pt was already on low dose reglan, wch would make results of questionable validity - with trial of max dose reglan and slow feeding sx resolved - had relatively recent EGDs (within the last ~year)   Anemia due to blood loss, acute  - Hgb stabilized at 8.2-8.5  UTI w/ sepsis  - fever to Tm 101 > resolved at time of d/c  - culture grew only multiple morphotypes  - to complete empiric 7 day course of abx   Hypernatremia  - resolved   Chronic systolic CHF  - last EF 78-29% 1/14 - well compensated at this time   Hypotension - Lactic acidosis  - resolved   Tachycardia - Sinus  - resolved after resumed B Blocker and clonidine + fluid   Dementia  - stable - no behavioral disturbances  - cont Namenda   Chronic kidney disease, stage III  - Cr at baseline     Medication List    STOP taking these medications       promethazine 25 MG suppository  Commonly known as:  PHENERGAN      TAKE these medications       amLODipine 10 MG tablet  Commonly known as:  NORVASC  Take 10 mg by mouth daily.     aspirin 81 MG tablet  Take 81 mg by mouth daily.  atorvastatin 20 MG tablet  Commonly known as:  LIPITOR  Take 1 tablet (20 mg total) by mouth daily at 6 PM.     cefUROXime 500 MG tablet  Commonly known as:  CEFTIN  Take 1 tablet (500 mg total) by mouth 2 (two) times daily with a meal.  Start taking on:  10/26/2013     cloNIDine 0.2 MG tablet  Commonly known as:  CATAPRES  Take 0.2 mg by mouth 2 (two) times daily.     clopidogrel 75 MG tablet  Commonly known as:  PLAVIX  Take 75 mg by mouth daily with breakfast.     docusate sodium 100 MG capsule  Commonly known as:  COLACE  Take 100 mg by mouth 2 (two) times daily.     ferrous sulfate 325 (65 FE) MG tablet  Take 325 mg by mouth 2 (two) times daily  with a meal.     FRESHKOTE OP  Place 1 drop into both eyes 4 (four) times daily.     hydrALAZINE 50 MG tablet  Commonly known as:  APRESOLINE  Take 50 mg by mouth 3 (three) times daily.     lactulose 10 GM/15ML solution  Commonly known as:  CHRONULAC  Take 30 mLs by mouth 2 (two) times daily.     memantine 10 MG tablet  Commonly known as:  NAMENDA  Take 10 mg by mouth 2 (two) times daily.     metoCLOPramide 10 MG tablet  Commonly known as:  REGLAN  Take 1 tablet (10 mg total) by mouth 4 (four) times daily -  before meals and at bedtime.     metoprolol 200 MG 24 hr tablet  Commonly known as:  TOPROL-XL  Take 1 tablet (200 mg total) by mouth daily. Take with or immediately following a meal.     pantoprazole 40 MG tablet  Commonly known as:  PROTONIX  Take 1 tablet (40 mg total) by mouth 2 (two) times daily before a meal.     polyethylene glycol packet  Commonly known as:  MIRALAX / GLYCOLAX  Take 17 g by mouth daily.     tamsulosin 0.4 MG Caps capsule  Commonly known as:  FLOMAX  Take 0.4 mg by mouth daily.     venlafaxine XR 75 MG 24 hr capsule  Commonly known as:  EFFEXOR-XR  Take 75 mg by mouth daily with breakfast.        Day of Discharge BP 123/77  Pulse 91  Temp(Src) 99.2 F (37.3 C) (Oral)  Resp 10  Ht 6' (1.829 m)  Wt 87.4 kg (192 lb 10.9 oz)  BMI 26.13 kg/m2  SpO2 98%  Physical Exam: General: No acute respiratory distress Lungs: Clear to auscultation bilaterally without wheezes or crackles Cardiovascular: Regular rate and rhythm without murmur gallop or rub normal S1 and S2 Abdomen: Nontender, nondistended, soft, bowel sounds positive, no rebound, no ascites, no appreciable mass Extremities: No significant cyanosis, clubbing, or edema bilateral lower extremities  Results for orders placed during the hospital encounter of 10/20/13 (from the past 24 hour(s))  GLUCOSE, CAPILLARY     Status: None   Collection Time    10/24/13  5:25 PM      Result  Value Range   Glucose-Capillary 87  70 - 99 mg/dL  GLUCOSE, CAPILLARY     Status: None   Collection Time    10/24/13 10:02 PM      Result Value Range   Glucose-Capillary 96  70 -  99 mg/dL   Comment 1 Notify RN    CBC     Status: Abnormal   Collection Time    10/25/13  3:05 AM      Result Value Range   WBC 5.0  4.0 - 10.5 K/uL   RBC 3.04 (*) 4.22 - 5.81 MIL/uL   Hemoglobin 8.2 (*) 13.0 - 17.0 g/dL   HCT 25.6 (*) 39.0 - 52.0 %   MCV 84.2  78.0 - 100.0 fL   MCH 27.0  26.0 - 34.0 pg   MCHC 32.0  30.0 - 36.0 g/dL   RDW 13.2  11.5 - 15.5 %   Platelets 237  150 - 400 K/uL  GLUCOSE, CAPILLARY     Status: None   Collection Time    10/25/13  7:45 AM      Result Value Range   Glucose-Capillary 99  70 - 99 mg/dL   Comment 1 Notify RN     Comment 2 Documented in Chart    GLUCOSE, CAPILLARY     Status: Abnormal   Collection Time    10/25/13 11:39 AM      Result Value Range   Glucose-Capillary 166 (*) 70 - 99 mg/dL   Comment 1 Notify RN     Comment 2 Documented in Chart      Time spent in discharge (includes decision making & examination of pt): >30 minutes  10/25/2013, 12:44 PM   Cherene Altes, MD [facility] Office  [phone] Pager [phone]  On-Call/Text Page:      [email or web address]      password [facility]

## 2013-10-25 NOTE — Plan of Care (Signed)
Problem: Phase I Progression Outcomes Goal: Voiding-avoid urinary catheter unless indicated Outcome: Adequate for Discharge Chronic foley from nursing home

## 2013-10-26 ENCOUNTER — Non-Acute Institutional Stay (SKILLED_NURSING_FACILITY): Payer: Medicare Other | Admitting: Internal Medicine

## 2013-10-26 DIAGNOSIS — N183 Chronic kidney disease, stage 3 unspecified: Secondary | ICD-10-CM

## 2013-10-26 DIAGNOSIS — D62 Acute posthemorrhagic anemia: Secondary | ICD-10-CM

## 2013-10-26 DIAGNOSIS — F0390 Unspecified dementia without behavioral disturbance: Secondary | ICD-10-CM

## 2013-10-26 DIAGNOSIS — I509 Heart failure, unspecified: Secondary | ICD-10-CM

## 2013-10-26 LAB — CULTURE, BLOOD (ROUTINE X 2)
CULTURE: NO GROWTH
Culture: NO GROWTH

## 2013-10-30 DIAGNOSIS — I509 Heart failure, unspecified: Secondary | ICD-10-CM | POA: Insufficient documentation

## 2013-10-30 DIAGNOSIS — D62 Acute posthemorrhagic anemia: Secondary | ICD-10-CM | POA: Insufficient documentation

## 2013-10-30 NOTE — Progress Notes (Signed)
HISTORY & PHYSICAL  DATE: 10/26/2013   FACILITY: [facility]  LEVEL OF CARE: SNF (31)  ALLERGIES:  No Known Allergies  CHIEF COMPLAINT:  Manage acute blood loss anemia, CHF and chronic kidney disease stage III  HISTORY OF PRESENT ILLNESS: Patient is a 68 year old African American male who was hospitalized secondary to hematemesis. After hospitalization patient was readmitted back to the facility for long-term care management.  ANEMIA: The anemia has been stable. The patient denies fatigue, melena or hematochezia. No complications from the medications currently being used. Loss hemoglobin 8.2-8.5.  CHF:The patient does not relate significant weight changes, denies sob, DOE, orthopnea, PNDs, pedal edema, palpitations or chest pain.  CHF remains stable.  No complications form the medications being used.  CHRONIC KIDNEY DISEASE: The patient's chronic kidney disease remains stable.  Patient denies increasing lower extremity swelling or confusion. Last creatinine : 1.49.  PAST MEDICAL HISTORY :  Past Medical History  Diagnosis Date  . Dementia   . Stroke   . Hydronephrosis   . Hypertension   . Hyperlipemia   . GERD (gastroesophageal reflux disease)   . BPH (benign prostatic hyperplasia)     PAST SURGICAL HISTORY: Past Surgical History  Procedure Laterality Date  . Esophagogastroduodenoscopy N/A 11/11/2012    Procedure: ESOPHAGOGASTRODUODENOSCOPY (EGD);  Surgeon: Lear Ng, MD;  Location: [facility] ENDOSCOPY;  Service: Endoscopy;  Laterality: N/A;  . Subxyphoid pericardial window N/A 11/13/2012    Procedure: SUBXYPHOID PERICARDIAL WINDOW;  Surgeon: Grace Isaac, MD;  Location: [facility];  Service: Thoracic;  Laterality: N/A;  . Esophagogastroduodenoscopy N/A 11/25/2012    Procedure: ESOPHAGOGASTRODUODENOSCOPY (EGD);  Surgeon: Winfield Cunas., MD;  Location: [facility] ENDOSCOPY;  Service: Endoscopy;  Laterality: N/A;    SOCIAL HISTORY:  reports that  he has never smoked. He has never used smokeless tobacco. He reports that he does not drink alcohol or use illicit drugs.  FAMILY HISTORY: None  CURRENT MEDICATIONS: Reviewed per MAR  REVIEW OF SYSTEMS:  See HPI otherwise 14 point ROS is negative.  PHYSICAL EXAMINATION  VS:  T 99.2        P 91      RR 10      BP 123/77       POX% 98        WT (Lb) 192  GENERAL: no acute distress, normal body habitus EYES: conjunctivae normal, sclerae normal, normal eye lids MOUTH/THROAT: lips without lesions,no lesions in the mouth,tongue is without lesions,uvula elevates in midline NECK: supple, trachea midline, no neck masses, no thyroid tenderness, no thyromegaly LYMPHATICS: no LAN in the neck, no supraclavicular LAN RESPIRATORY: breathing is even & unlabored, BS CTAB CARDIAC: RRR, no murmur,no extra heart sounds, no edema GI:  ABDOMEN: abdomen soft, normal BS, no masses, no tenderness  LIVER/SPLEEN: no hepatomegaly, no splenomegaly MUSCULOSKELETAL: HEAD: normal to inspection & palpation BACK: no kyphosis, scoliosis or spinal processes tenderness EXTREMITIES: LEFT UPPER EXTREMITY: Moderate range of motion, decreased strength & tone RIGHT UPPER EXTREMITY:  Hemiparesis LEFT LOWER EXTREMITY:  Unable to assess RIGHT LOWER EXTREMITY:  Unable to assess PSYCHIATRIC: the patient is alert & oriented to person, affect & behavior appropriate  LABS/RADIOLOGY:  10-19-13 glucose 137, creatinine 1.49 otherwise BMP normal 1-15 BUN 20, creatinine 1.26 11-14 HDL 38 otherwise FLP normal  Labs reviewed: Basic Metabolic Panel:  Recent Labs  11/18/12 0111 11/19/12 0830  11/26/12 1757  10/22/13 0600 10/23/13 0243 10/24/13 0308  NA  --   --   < >  --   < >  145 143 141  K  --   --   < >  --   < > 4.3 4.1 4.3  CL  --   --   < >  --   < > 113* 110 109  CO2  --   --   < >  --   < > 21 21 21   GLUCOSE  --   --   < >  --   < > 105* 106* 90  BUN  --   --   < >  --   < > 22 14 13   CREATININE  --   --   < >  --    < > 1.36* 1.21 1.26  CALCIUM  --   --   < >  --   < > 7.6* 7.8* 8.0*  MG 1.5 1.6  --  1.8  --   --   --   --   PHOS  --  2.7  --   --   --   --   --   --   < > = values in this interval not displayed. Liver Function Tests:  Recent Labs  11/30/12 0434 12/24/12 2115 10/20/13 1031  AST 9 14 16   ALT 5 6 11   ALKPHOS 37* 61 80  BILITOT 0.2* 0.2* <0.2*  PROT 5.1* 6.4 7.9  ALBUMIN 2.3* 3.1* 3.5    Recent Labs  11/24/12 0004 11/29/12 1000 10/20/13 1031  LIPASE 35 34 24   CBC:  Recent Labs  11/29/12 1000  12/24/12 2115 10/20/13 1031  10/23/13 0243 10/24/13 0308 10/25/13 0305  WBC 11.2*  < > 8.7 13.9*  < > 4.5 3.9* 5.0  NEUTROABS 9.9*  --  6.9 11.0*  --   --   --   --   HGB 9.8*  < > 8.3* 14.2  < > 8.5* 8.1* 8.2*  HCT 30.8*  < > 26.6* 42.9  < > 26.9* 25.3* 25.6*  MCV 80.0  < > 72.3* 85.3  < > 86.2 85.8 84.2  PLT 527*  < > 306 465*  < > 226 211 237  < > = values in this interval not displayed.  Cardiac Enzymes:  Recent Labs  10/20/13 1031 10/20/13 1830 10/21/13 0055  TROPONINI 0.35* <0.30 <0.30   CBG:  Recent Labs  10/24/13 2202 10/25/13 0745 10/25/13 1139  GLUCAP 96 99 166*   EXAM: PORTABLE CHEST - 1 VIEW   COMPARISON:  DG CHEST 1 VIEW dated 11/29/2012; DG CHEST 1V PORT dated 11/24/2012; CT ABD/PELV WO CM dated 11/24/2012   FINDINGS: There are low lung volumes. There is no focal parenchymal opacity, pleural effusion, or pneumothorax. Stable cardiomediastinal silhouette.   The osseous structures are unremarkable.   IMPRESSION: No active disease.   ASSESSMENT/PLAN:  Acute blood loss anemia-recheck CHF-well compensated Chronic kidney disease stage III-recheck Dementia-stable Renovascular hypertension-well controlled BPH-continue Flomax  I have reviewed patient's medical records received at admission/from hospitalization.  CPT CODE: 70350

## 2014-01-08 ENCOUNTER — Emergency Department (HOSPITAL_COMMUNITY): Payer: Medicare Other

## 2014-01-08 ENCOUNTER — Inpatient Hospital Stay (HOSPITAL_COMMUNITY)
Admission: EM | Admit: 2014-01-08 | Discharge: 2014-01-20 | DRG: 872 | Disposition: A | Discharge status: Skilled Nursing Facility | Payer: Medicare Other | Attending: Internal Medicine | Admitting: Internal Medicine

## 2014-01-08 ENCOUNTER — Encounter (HOSPITAL_COMMUNITY): Payer: Self-pay | Admitting: Emergency Medicine

## 2014-01-08 DIAGNOSIS — F32A Depression, unspecified: Secondary | ICD-10-CM | POA: Diagnosis present

## 2014-01-08 DIAGNOSIS — K208 Other esophagitis without bleeding: Secondary | ICD-10-CM | POA: Diagnosis present

## 2014-01-08 DIAGNOSIS — I2589 Other forms of chronic ischemic heart disease: Secondary | ICD-10-CM | POA: Diagnosis present

## 2014-01-08 DIAGNOSIS — R7881 Bacteremia: Secondary | ICD-10-CM | POA: Diagnosis present

## 2014-01-08 DIAGNOSIS — E1129 Type 2 diabetes mellitus with other diabetic kidney complication: Secondary | ICD-10-CM | POA: Diagnosis present

## 2014-01-08 DIAGNOSIS — N12 Tubulo-interstitial nephritis, not specified as acute or chronic: Secondary | ICD-10-CM | POA: Diagnosis present

## 2014-01-08 DIAGNOSIS — D631 Anemia in chronic kidney disease: Secondary | ICD-10-CM | POA: Diagnosis present

## 2014-01-08 DIAGNOSIS — K922 Gastrointestinal hemorrhage, unspecified: Secondary | ICD-10-CM | POA: Diagnosis present

## 2014-01-08 DIAGNOSIS — I429 Cardiomyopathy, unspecified: Secondary | ICD-10-CM | POA: Diagnosis present

## 2014-01-08 DIAGNOSIS — Z7982 Long term (current) use of aspirin: Secondary | ICD-10-CM

## 2014-01-08 DIAGNOSIS — I10 Essential (primary) hypertension: Secondary | ICD-10-CM | POA: Diagnosis present

## 2014-01-08 DIAGNOSIS — I129 Hypertensive chronic kidney disease with stage 1 through stage 4 chronic kidney disease, or unspecified chronic kidney disease: Secondary | ICD-10-CM | POA: Diagnosis present

## 2014-01-08 DIAGNOSIS — R4701 Aphasia: Secondary | ICD-10-CM | POA: Diagnosis present

## 2014-01-08 DIAGNOSIS — R1310 Dysphagia, unspecified: Secondary | ICD-10-CM | POA: Diagnosis present

## 2014-01-08 DIAGNOSIS — I472 Ventricular tachycardia, unspecified: Secondary | ICD-10-CM | POA: Diagnosis not present

## 2014-01-08 DIAGNOSIS — N183 Chronic kidney disease, stage 3 unspecified: Secondary | ICD-10-CM | POA: Diagnosis present

## 2014-01-08 DIAGNOSIS — Z515 Encounter for palliative care: Secondary | ICD-10-CM

## 2014-01-08 DIAGNOSIS — L89312 Pressure ulcer of right buttock, stage 2: Secondary | ICD-10-CM | POA: Diagnosis present

## 2014-01-08 DIAGNOSIS — F0390 Unspecified dementia without behavioral disturbance: Secondary | ICD-10-CM | POA: Diagnosis present

## 2014-01-08 DIAGNOSIS — Z79899 Other long term (current) drug therapy: Secondary | ICD-10-CM

## 2014-01-08 DIAGNOSIS — D62 Acute posthemorrhagic anemia: Secondary | ICD-10-CM | POA: Diagnosis present

## 2014-01-08 DIAGNOSIS — N10 Acute tubulo-interstitial nephritis: Secondary | ICD-10-CM

## 2014-01-08 DIAGNOSIS — K920 Hematemesis: Secondary | ICD-10-CM | POA: Diagnosis present

## 2014-01-08 DIAGNOSIS — I4729 Other ventricular tachycardia: Secondary | ICD-10-CM | POA: Diagnosis present

## 2014-01-08 DIAGNOSIS — N189 Chronic kidney disease, unspecified: Secondary | ICD-10-CM

## 2014-01-08 DIAGNOSIS — N039 Chronic nephritic syndrome with unspecified morphologic changes: Secondary | ICD-10-CM

## 2014-01-08 DIAGNOSIS — A419 Sepsis, unspecified organism: Secondary | ICD-10-CM | POA: Diagnosis present

## 2014-01-08 DIAGNOSIS — R531 Weakness: Secondary | ICD-10-CM

## 2014-01-08 DIAGNOSIS — I313 Pericardial effusion (noninflammatory): Secondary | ICD-10-CM

## 2014-01-08 DIAGNOSIS — I519 Heart disease, unspecified: Secondary | ICD-10-CM

## 2014-01-08 DIAGNOSIS — N400 Enlarged prostate without lower urinary tract symptoms: Secondary | ICD-10-CM | POA: Diagnosis present

## 2014-01-08 DIAGNOSIS — I639 Cerebral infarction, unspecified: Secondary | ICD-10-CM

## 2014-01-08 DIAGNOSIS — N058 Unspecified nephritic syndrome with other morphologic changes: Secondary | ICD-10-CM | POA: Diagnosis present

## 2014-01-08 DIAGNOSIS — I3139 Other pericardial effusion (noninflammatory): Secondary | ICD-10-CM

## 2014-01-08 DIAGNOSIS — F3289 Other specified depressive episodes: Secondary | ICD-10-CM | POA: Diagnosis present

## 2014-01-08 DIAGNOSIS — E785 Hyperlipidemia, unspecified: Secondary | ICD-10-CM | POA: Diagnosis present

## 2014-01-08 DIAGNOSIS — N179 Acute kidney failure, unspecified: Secondary | ICD-10-CM

## 2014-01-08 DIAGNOSIS — Z6826 Body mass index (BMI) 26.0-26.9, adult: Secondary | ICD-10-CM

## 2014-01-08 DIAGNOSIS — K219 Gastro-esophageal reflux disease without esophagitis: Secondary | ICD-10-CM | POA: Diagnosis present

## 2014-01-08 DIAGNOSIS — A4159 Other Gram-negative sepsis: Principal | ICD-10-CM | POA: Diagnosis present

## 2014-01-08 DIAGNOSIS — I509 Heart failure, unspecified: Secondary | ICD-10-CM

## 2014-01-08 DIAGNOSIS — R000 Tachycardia, unspecified: Secondary | ICD-10-CM | POA: Diagnosis present

## 2014-01-08 DIAGNOSIS — I69920 Aphasia following unspecified cerebrovascular disease: Secondary | ICD-10-CM

## 2014-01-08 DIAGNOSIS — F329 Major depressive disorder, single episode, unspecified: Secondary | ICD-10-CM | POA: Diagnosis present

## 2014-01-08 DIAGNOSIS — E876 Hypokalemia: Secondary | ICD-10-CM | POA: Diagnosis present

## 2014-01-08 DIAGNOSIS — E440 Moderate protein-calorie malnutrition: Secondary | ICD-10-CM | POA: Diagnosis present

## 2014-01-08 DIAGNOSIS — I69959 Hemiplegia and hemiparesis following unspecified cerebrovascular disease affecting unspecified side: Secondary | ICD-10-CM

## 2014-01-08 DIAGNOSIS — N390 Urinary tract infection, site not specified: Secondary | ICD-10-CM

## 2014-01-08 DIAGNOSIS — I69351 Hemiplegia and hemiparesis following cerebral infarction affecting right dominant side: Secondary | ICD-10-CM

## 2014-01-08 DIAGNOSIS — E1165 Type 2 diabetes mellitus with hyperglycemia: Secondary | ICD-10-CM | POA: Diagnosis present

## 2014-01-08 HISTORY — DX: Cardiomyopathy, unspecified: I42.9

## 2014-01-08 LAB — GLUCOSE, CAPILLARY: GLUCOSE-CAPILLARY: 99 mg/dL (ref 70–99)

## 2014-01-08 LAB — URINALYSIS, ROUTINE W REFLEX MICROSCOPIC
Bilirubin Urine: NEGATIVE
Bilirubin Urine: NEGATIVE
Glucose, UA: NEGATIVE mg/dL
Glucose, UA: NEGATIVE mg/dL
Ketones, ur: NEGATIVE mg/dL
Ketones, ur: NEGATIVE mg/dL
NITRITE: NEGATIVE
Nitrite: POSITIVE — AB
PROTEIN: 100 mg/dL — AB
PROTEIN: 100 mg/dL — AB
SPECIFIC GRAVITY, URINE: 1.029 (ref 1.005–1.030)
Specific Gravity, Urine: 1.021 (ref 1.005–1.030)
UROBILINOGEN UA: 0.2 mg/dL (ref 0.0–1.0)
UROBILINOGEN UA: 0.2 mg/dL (ref 0.0–1.0)
pH: 6.5 (ref 5.0–8.0)
pH: 5.5 (ref 5.0–8.0)

## 2014-01-08 LAB — BLOOD GAS, ARTERIAL
Acid-Base Excess: 1.5 mmol/L (ref 0.0–2.0)
BICARBONATE: 24 meq/L (ref 20.0–24.0)
Drawn by: 276051
FIO2: 0.21 %
O2 Saturation: 95.2 %
PH ART: 7.452 — AB (ref 7.350–7.450)
Patient temperature: 101.8
TCO2: 20.7 mmol/L (ref 0–100)
pCO2 arterial: 35.5 mmHg (ref 35.0–45.0)
pO2, Arterial: 84.7 mmHg (ref 80.0–100.0)

## 2014-01-08 LAB — COMPREHENSIVE METABOLIC PANEL
ALT: 8 U/L (ref 0–53)
AST: 15 U/L (ref 0–37)
Albumin: 3.2 g/dL — ABNORMAL LOW (ref 3.5–5.2)
Alkaline Phosphatase: 76 U/L (ref 39–117)
BUN: 22 mg/dL (ref 6–23)
CO2: 23 meq/L (ref 19–32)
Calcium: 9.8 mg/dL (ref 8.4–10.5)
Chloride: 106 mEq/L (ref 96–112)
Creatinine, Ser: 1.16 mg/dL (ref 0.50–1.35)
GFR calc Af Amer: 73 mL/min — ABNORMAL LOW (ref >90)
GFR, EST NON AFRICAN AMERICAN: 63 mL/min — AB (ref >90)
GLUCOSE: 158 mg/dL — AB (ref 70–99)
POTASSIUM: 3.4 meq/L — AB (ref 3.7–5.3)
SODIUM: 145 meq/L (ref 137–147)
TOTAL PROTEIN: 7 g/dL (ref 6.0–8.3)
Total Bilirubin: 0.2 mg/dL — ABNORMAL LOW (ref 0.3–1.2)

## 2014-01-08 LAB — CBC WITH DIFFERENTIAL/PLATELET
Basophils Absolute: 0 10*3/uL (ref 0.0–0.1)
Basophils Relative: 0 % (ref 0–1)
Eosinophils Absolute: 0 10*3/uL (ref 0.0–0.7)
Eosinophils Relative: 0 % (ref 0–5)
HCT: 42.9 % (ref 39.0–52.0)
Hemoglobin: 13.9 g/dL (ref 13.0–17.0)
LYMPHS ABS: 1.2 10*3/uL (ref 0.7–4.0)
LYMPHS PCT: 11 % — AB (ref 12–46)
MCH: 25.6 pg — ABNORMAL LOW (ref 26.0–34.0)
MCHC: 32.4 g/dL (ref 30.0–36.0)
MCV: 79.2 fL (ref 78.0–100.0)
Monocytes Absolute: 0.8 10*3/uL (ref 0.1–1.0)
Monocytes Relative: 7 % (ref 3–12)
NEUTROS PCT: 81 % — AB (ref 43–77)
Neutro Abs: 9 10*3/uL — ABNORMAL HIGH (ref 1.7–7.7)
Platelets: 460 10*3/uL — ABNORMAL HIGH (ref 150–400)
RBC: 5.42 MIL/uL (ref 4.22–5.81)
RDW: 16.1 % — ABNORMAL HIGH (ref 11.5–15.5)
WBC: 11.1 10*3/uL — AB (ref 4.0–10.5)

## 2014-01-08 LAB — TROPONIN I: Troponin I: <0.3 ng/mL (ref <0.30)

## 2014-01-08 LAB — PROCALCITONIN: Procalcitonin: <0.1 ng/mL

## 2014-01-08 LAB — CBC
HCT: 40.1 % (ref 39.0–52.0)
Hemoglobin: 13 g/dL (ref 13.0–17.0)
MCH: 25.7 pg — AB (ref 26.0–34.0)
MCHC: 32.4 g/dL (ref 30.0–36.0)
MCV: 79.4 fL (ref 78.0–100.0)
Platelets: 370 10*3/uL (ref 150–400)
RBC: 5.05 MIL/uL (ref 4.22–5.81)
RDW: 16 % — ABNORMAL HIGH (ref 11.5–15.5)
WBC: 9.8 10*3/uL (ref 4.0–10.5)

## 2014-01-08 LAB — MRSA PCR SCREENING: MRSA BY PCR: NEGATIVE

## 2014-01-08 LAB — PROTIME-INR
INR: 1.04 (ref 0.00–1.49)
Prothrombin Time: 13.4 seconds (ref 11.6–15.2)

## 2014-01-08 LAB — URINE MICROSCOPIC-ADD ON

## 2014-01-08 LAB — MAGNESIUM: Magnesium: 1.8 mg/dL (ref 1.5–2.5)

## 2014-01-08 LAB — I-STAT CG4 LACTIC ACID, ED: Lactic Acid, Venous: 1.33 mmol/L (ref 0.5–2.2)

## 2014-01-08 MED ORDER — VANCOMYCIN HCL IN DEXTROSE 1-5 GM/200ML-% IV SOLN
1000.0000 mg | Freq: Once | INTRAVENOUS | Status: AC
  Administered 2014-01-08: 1000 mg via INTRAVENOUS
  Filled 2014-01-08: qty 200

## 2014-01-08 MED ORDER — FERROUS SULFATE 325 (65 FE) MG PO TABS
325.0000 mg | ORAL_TABLET | Freq: Two times a day (BID) | ORAL | Status: DC
  Administered 2014-01-10 – 2014-01-20 (×18): 325 mg via ORAL
  Filled 2014-01-08 (×26): qty 1

## 2014-01-08 MED ORDER — TAMSULOSIN HCL 0.4 MG PO CAPS
0.4000 mg | ORAL_CAPSULE | Freq: Every day | ORAL | Status: DC
  Administered 2014-01-10 – 2014-01-20 (×9): 0.4 mg via ORAL
  Filled 2014-01-08 (×13): qty 1

## 2014-01-08 MED ORDER — ACETAMINOPHEN 650 MG RE SUPP
650.0000 mg | Freq: Once | RECTAL | Status: AC
  Administered 2014-01-08: 650 mg via RECTAL
  Filled 2014-01-08: qty 1

## 2014-01-08 MED ORDER — PIPERACILLIN-TAZOBACTAM 3.375 G IVPB
3.3750 g | Freq: Three times a day (TID) | INTRAVENOUS | Status: DC
  Administered 2014-01-08 – 2014-01-11 (×9): 3.375 g via INTRAVENOUS
  Filled 2014-01-08 (×10): qty 50

## 2014-01-08 MED ORDER — SODIUM CHLORIDE 0.9 % IV SOLN
8.0000 mg/h | INTRAVENOUS | Status: DC
  Administered 2014-01-08 – 2014-01-10 (×4): 8 mg/h via INTRAVENOUS
  Filled 2014-01-08 (×7): qty 80

## 2014-01-08 MED ORDER — METOCLOPRAMIDE HCL 5 MG/ML IJ SOLN
5.0000 mg | Freq: Four times a day (QID) | INTRAMUSCULAR | Status: DC
  Administered 2014-01-08 – 2014-01-20 (×46): 5 mg via INTRAVENOUS
  Filled 2014-01-08 (×3): qty 1
  Filled 2014-01-08: qty 2
  Filled 2014-01-08 (×15): qty 1
  Filled 2014-01-08: qty 2
  Filled 2014-01-08: qty 1
  Filled 2014-01-08: qty 2
  Filled 2014-01-08 (×10): qty 1
  Filled 2014-01-08: qty 2
  Filled 2014-01-08 (×4): qty 1
  Filled 2014-01-08: qty 2
  Filled 2014-01-08 (×4): qty 1
  Filled 2014-01-08: qty 2
  Filled 2014-01-08 (×11): qty 1

## 2014-01-08 MED ORDER — VANCOMYCIN HCL IN DEXTROSE 1-5 GM/200ML-% IV SOLN: 1000.0000 mg | Freq: Once | INTRAVENOUS | Status: DC

## 2014-01-08 MED ORDER — HYDRALAZINE HCL 50 MG PO TABS
50.0000 mg | ORAL_TABLET | Freq: Three times a day (TID) | ORAL | Status: DC
  Administered 2014-01-10 – 2014-01-13 (×8): 50 mg via ORAL
  Filled 2014-01-08 (×16): qty 1

## 2014-01-08 MED ORDER — SODIUM CHLORIDE 0.9 % IJ SOLN
10.0000 mL | Freq: Two times a day (BID) | INTRAMUSCULAR | Status: DC
  Administered 2014-01-08 – 2014-01-11 (×5): 10 mL
  Administered 2014-01-11: 20 mL
  Administered 2014-01-12: 10 mL
  Administered 2014-01-12: 20 mL
  Administered 2014-01-13 – 2014-01-16 (×7): 10 mL
  Administered 2014-01-17: 20 mL
  Administered 2014-01-17 – 2014-01-18 (×4): 10 mL

## 2014-01-08 MED ORDER — VANCOMYCIN HCL IN DEXTROSE 1-5 GM/200ML-% IV SOLN
1000.0000 mg | Freq: Two times a day (BID) | INTRAVENOUS | Status: DC
  Administered 2014-01-09: 1000 mg via INTRAVENOUS
  Filled 2014-01-08: qty 200

## 2014-01-08 MED ORDER — SODIUM CHLORIDE 0.9 % IJ SOLN
3.0000 mL | Freq: Two times a day (BID) | INTRAMUSCULAR | Status: DC
  Administered 2014-01-10 – 2014-01-17 (×9): 3 mL via INTRAVENOUS

## 2014-01-08 MED ORDER — HYDRALAZINE HCL 20 MG/ML IJ SOLN
10.0000 mg | Freq: Four times a day (QID) | INTRAMUSCULAR | Status: DC | PRN
  Administered 2014-01-08 – 2014-01-14 (×9): 10 mg via INTRAVENOUS
  Filled 2014-01-08 (×8): qty 1
  Filled 2014-01-08: qty 0.5
  Filled 2014-01-08 (×2): qty 1

## 2014-01-08 MED ORDER — ONDANSETRON HCL 4 MG PO TABS: 4.0000 mg | ORAL_TABLET | Freq: Four times a day (QID) | ORAL | Status: DC | PRN

## 2014-01-08 MED ORDER — INSULIN ASPART 100 UNIT/ML ~~LOC~~ SOLN
0.0000 [IU] | Freq: Three times a day (TID) | SUBCUTANEOUS | Status: DC
  Administered 2014-01-09 – 2014-01-10 (×4): 1 [IU] via SUBCUTANEOUS
  Administered 2014-01-11: 2 [IU] via SUBCUTANEOUS
  Administered 2014-01-11 – 2014-01-12 (×5): 1 [IU] via SUBCUTANEOUS
  Administered 2014-01-14: 2 [IU] via SUBCUTANEOUS
  Administered 2014-01-15 – 2014-01-16 (×3): 1 [IU] via SUBCUTANEOUS

## 2014-01-08 MED ORDER — LEVALBUTEROL HCL 0.63 MG/3ML IN NEBU: 0.6300 mg | INHALATION_SOLUTION | RESPIRATORY_TRACT | Status: DC | PRN

## 2014-01-08 MED ORDER — PIPERACILLIN-TAZOBACTAM 3.375 G IVPB: 3.3750 g | Freq: Once | INTRAVENOUS | Status: DC

## 2014-01-08 MED ORDER — ACETAMINOPHEN 325 MG PO TABS: 650.0000 mg | ORAL_TABLET | Freq: Four times a day (QID) | ORAL | Status: DC | PRN

## 2014-01-08 MED ORDER — PIPERACILLIN-TAZOBACTAM 3.375 G IVPB 30 MIN
3.3750 g | Freq: Once | INTRAVENOUS | Status: AC
  Administered 2014-01-08: 3.375 g via INTRAVENOUS
  Filled 2014-01-08: qty 50

## 2014-01-08 MED ORDER — ACETAMINOPHEN 650 MG RE SUPP
650.0000 mg | Freq: Four times a day (QID) | RECTAL | Status: DC | PRN
  Administered 2014-01-11: 650 mg via RECTAL
  Filled 2014-01-08: qty 1

## 2014-01-08 MED ORDER — POTASSIUM CHLORIDE IN NACL 40-0.9 MEQ/L-% IV SOLN
INTRAVENOUS | Status: DC
  Administered 2014-01-08: 21:00:00 via INTRAVENOUS
  Filled 2014-01-08 (×2): qty 1000

## 2014-01-08 MED ORDER — ONDANSETRON HCL 4 MG/2ML IJ SOLN
4.0000 mg | Freq: Four times a day (QID) | INTRAMUSCULAR | Status: DC | PRN
  Administered 2014-01-11 – 2014-01-15 (×3): 4 mg via INTRAVENOUS
  Filled 2014-01-08 (×3): qty 2

## 2014-01-08 MED ORDER — METOPROLOL TARTRATE 1 MG/ML IV SOLN
5.0000 mg | Freq: Three times a day (TID) | INTRAVENOUS | Status: DC
  Administered 2014-01-08 – 2014-01-11 (×9): 5 mg via INTRAVENOUS
  Filled 2014-01-08 (×13): qty 5

## 2014-01-08 MED ORDER — SODIUM CHLORIDE 0.9 % IJ SOLN
10.0000 mL | INTRAMUSCULAR | Status: DC | PRN
  Administered 2014-01-16 – 2014-01-19 (×2): 10 mL
  Administered 2014-01-19: 20 mL
  Administered 2014-01-19: 10 mL
  Administered 2014-01-19: 20 mL
  Administered 2014-01-20 (×2): 10 mL

## 2014-01-08 MED ORDER — IPRATROPIUM BROMIDE 0.02 % IN SOLN: 0.5000 mg | RESPIRATORY_TRACT | Status: DC | PRN

## 2014-01-08 MED ORDER — SODIUM CHLORIDE 0.9 % IV SOLN
80.0000 mg | Freq: Once | INTRAVENOUS | Status: AC
  Administered 2014-01-08: 80 mg via INTRAVENOUS
  Filled 2014-01-08: qty 80

## 2014-01-08 NOTE — ED Notes (Signed)
IV team nurses unable to insert PIV, ultrasound was used without success. Dr Venora Maples made aware.

## 2014-01-08 NOTE — ED Notes (Signed)
Urinalysis collected from existing catheter which was clamped prior to collection

## 2014-01-08 NOTE — ED Provider Notes (Signed)
CSN: [id number]     Arrival date & time 01/08/14  1354 History   First MD Initiated Contact with Patient 01/08/14 1408     Chief Complaint  Patient presents with  . Hematemesis    Level V caveat: Dementia  HPI Patient is brought to the emergency department from the nursing home for developing coffee-ground emesis today.  As reported by nursing home that the patient complained of some abdominal pain.  IM Phenergan was given prior to arrival in the ER.  Patient is a chronic indwelling Foley catheter.  Past Medical History  Diagnosis Date  . Dementia   . Stroke   . Hydronephrosis   . Hypertension   . Hyperlipemia   . GERD (gastroesophageal reflux disease)   . BPH (benign prostatic hyperplasia)    Past Surgical History  Procedure Laterality Date  . Esophagogastroduodenoscopy N/A 11/11/2012    Procedure: ESOPHAGOGASTRODUODENOSCOPY (EGD);  Surgeon: Lear Ng, MD;  Location: [facility] ENDOSCOPY;  Service: Endoscopy;  Laterality: N/A;  . Subxyphoid pericardial window N/A 11/13/2012    Procedure: SUBXYPHOID PERICARDIAL WINDOW;  Surgeon: Grace Isaac, MD;  Location: [place];  Service: Thoracic;  Laterality: N/A;  . Esophagogastroduodenoscopy N/A 11/25/2012    Procedure: ESOPHAGOGASTRODUODENOSCOPY (EGD);  Surgeon: Winfield Cunas., MD;  Location: [facility] ENDOSCOPY;  Service: Endoscopy;  Laterality: N/A;   No family history on file. History  Substance Use Topics  . Smoking status: Never Smoker   . Smokeless tobacco: Never Used  . Alcohol Use: No    Review of Systems  Unable to perform ROS: Dementia      Allergies  Review of patient's allergies indicates no known allergies.  Home Medications   Prior to Admission medications   Medication Sig Start Date End Date Taking? Authorizing Provider  amLODipine (NORVASC) 10 MG tablet Take 10 mg by mouth daily.   Yes Historical Provider, MD  aspirin 81 MG tablet Take 81 mg by mouth daily.   Yes Historical Provider, MD  atorvastatin  (LIPITOR) 20 MG tablet Take 1 tablet (20 mg total) by mouth daily at 6 PM. 12/04/12  Yes Belkys A Regalado, MD  cloNIDine (CATAPRES) 0.2 MG tablet Take 0.2 mg by mouth 2 (two) times daily.   Yes Historical Provider, MD  clopidogrel (PLAVIX) 75 MG tablet Take 75 mg by mouth daily with breakfast.   Yes Historical Provider, MD  docusate sodium (COLACE) 100 MG capsule Take 100 mg by mouth 2 (two) times daily.   Yes Historical Provider, MD  ferrous sulfate 325 (65 FE) MG tablet Take 325 mg by mouth 2 (two) times daily with a meal.   Yes Historical Provider, MD  hydrALAZINE (APRESOLINE) 50 MG tablet Take 50 mg by mouth 3 (three) times daily.   Yes Historical Provider, MD  lactulose (CHRONULAC) 10 GM/15ML solution Take 30 mLs by mouth 2 (two) times daily.   Yes Historical Provider, MD  memantine (NAMENDA) 10 MG tablet Take 10 mg by mouth 2 (two) times daily.   Yes Historical Provider, MD  metoCLOPramide (REGLAN) 10 MG tablet Take 1 tablet (10 mg total) by mouth 4 (four) times daily -  before meals and at bedtime. 10/25/13  Yes Cherene Altes, MD  metoprolol succinate (TOPROL-XL) 200 MG 24 hr tablet Take 1 tablet (200 mg total) by mouth daily. Take with or immediately following a meal. 11/20/12  Yes Nita Sells, MD  pantoprazole (PROTONIX) 40 MG tablet Take 1 tablet (40 mg total) by mouth 2 (two) times  daily before a meal. 10/25/13  Yes Cherene Altes, MD  polyethylene glycol [facility] / GLYCOLAX) packet Take 17 g by mouth daily. 12/04/12  Yes Belkys A Regalado, MD  Polyvinyl Alcohol-Povidone (FRESHKOTE OP) Place 1 drop into both eyes 4 (four) times daily.   Yes Historical Provider, MD  tamsulosin (FLOMAX) 0.4 MG CAPS Take 0.4 mg by mouth daily.   Yes Historical Provider, MD  venlafaxine XR (EFFEXOR-XR) 75 MG 24 hr capsule Take 75 mg by mouth daily with breakfast.   Yes Historical Provider, MD   BP 202/93  Pulse 131  Temp(Src) 101.8 F (38.8 C) (Rectal)  Resp 16  SpO2 98% Physical Exam  Nursing  note and vitals reviewed. Constitutional: He appears well-developed and well-nourished.  HENT:  Head: Normocephalic and atraumatic.  Eyes: EOM are normal.  Neck: Normal range of motion.  Cardiovascular: Regular rhythm, normal heart sounds and intact distal pulses.   Tachycardia  Pulmonary/Chest: Effort normal and breath sounds normal. No respiratory distress.  Abdominal: Soft. He exhibits no distension. There is no tenderness.  Genitourinary:  Green stool without gross blood on rectal exam  Musculoskeletal: Normal range of motion.  Neurological: He is alert.  Answers very simple questions  Skin: Skin is warm and dry.  Psychiatric: He has a normal mood and affect. Judgment normal.    ED Course  Procedures   CENTRAL LINE Performed by: Hoy Morn Consent: The procedure was performed in an emergent situation. Required items: required blood products, implants, devices, and special equipment available Patient identity confirmed: arm band and provided demographic data Time out: Immediately prior to procedure a "time out" was called to verify the correct patient, procedure, equipment, support staff and site/side marked as required. Indications: vascular access Anesthesia: local infiltration Local anesthetic: lidocaine 1% with epinephrine Anesthetic total: 3 ml Patient sedated: no Preparation: skin prepped with 2% chlorhexidine Skin prep agent dried: skin prep agent completely dried prior to procedure Sterile barriers: all five maximum sterile barriers used - cap, mask, sterile gown, sterile gloves, and large sterile sheet Hand hygiene: hand hygiene performed prior to central venous catheter insertion Location details: right subclavian Catheter type: triple lumen Catheter size: 8 Fr Pre-procedure: landmarks identified Ultrasound guidance: no Successful placement: yes Post-procedure: line sutured and dressing applied Assessment: blood return through all parts, free fluid flow,  placement verified by x-ray and no pneumothorax on x-ray Patient tolerance: Patient tolerated the procedure well with no immediate complications.   CRITICAL CARE Performed by: Hoy Morn Total critical care time: 32 Critical care time was exclusive of separately billable procedures and treating other patients. Critical care was necessary to treat or prevent imminent or life-threatening deterioration. Critical care was time spent personally by me on the following activities: development of treatment plan with patient and/or surrogate as well as nursing, discussions with consultants, evaluation of patient's response to treatment, examination of patient, obtaining history from patient or surrogate, ordering and performing treatments and interventions, ordering and review of laboratory studies, ordering and review of radiographic studies, pulse oximetry and re-evaluation of patient's condition.  Apiration of blood/fluid Performed by: Hoy Morn Consent obtained. Required items: required blood products, implants, devices, and special equipment available Patient identity confirmed: verbally with patient Time out: Immediately prior to procedure a "time out" was called to verify the correct patient, procedure, equipment, support staff and site/side marked as required. Preparation: Patient was prepped and draped in the usual sterile fashion. Patient tolerance: Patient tolerated the procedure well with no  immediate complications. Location of aspiration: left brachial artery     Labs Review Labs Reviewed  CBC WITH DIFFERENTIAL - Abnormal; Notable for the following:    WBC 11.1 (*)    MCH 25.6 (*)    RDW 16.1 (*)    Platelets 460 (*)    Neutrophils Relative % 81 (*)    Neutro Abs 9.0 (*)    Lymphocytes Relative 11 (*)    All other components within normal limits  URINALYSIS, ROUTINE W REFLEX MICROSCOPIC - Abnormal; Notable for the following:    APPearance TURBID (*)    Hgb urine  dipstick SMALL (*)    Protein, ur 100 (*)    Leukocytes, UA LARGE (*)    All other components within normal limits  URINE MICROSCOPIC-ADD ON - Abnormal; Notable for the following:    Bacteria, UA MANY (*)    All other components within normal limits  CULTURE, BLOOD (ROUTINE X 2)  CULTURE, BLOOD (ROUTINE X 2)  URINE CULTURE  PROTIME-INR  COMPREHENSIVE METABOLIC PANEL  TROPONIN I  BLOOD GAS, ARTERIAL  I-STAT CG4 LACTIC ACID, ED  I-STAT CG4 LACTIC ACID, ED  POC OCCULT BLOOD, ED    Imaging Review Dg Chest Portable 1 View  01/08/2014   CLINICAL DATA:  Hematemesis, fever, central line placement  EXAM: PORTABLE CHEST - 1 VIEW  COMPARISON:  DG CHEST 1V PORT dated 10/20/2013  FINDINGS: Interval placement of a right central venous line with tip in the distal SVC. Cardiac silhouette is enlarged. There are low lung volumes. No pneumothorax.  IMPRESSION: Right central venous line in good position.   Electronically Signed   By: Suzy Bouchard M.D.   On: 01/08/2014 16:09  I personally reviewed the imaging tests through PACS system I reviewed available ER/hospitalization records through the EMR    EKG Interpretation   Date/Time:  Saturday January 08 2014 14:19:32 EDT Ventricular Rate:  140 PR Interval:    QRS Duration: 144 QT Interval:  377 QTC Calculation: 575 R Axis:   164 Text Interpretation:  Sinus tachycardia Nonspecific intraventricular  conduction delay Nonspecific ST abnormality No significant change was  found Confirmed by Sumi Lye  MD, Lennette Bihari ([id number]) on 01/08/2014 2:29:56 PM      MDM   Final diagnoses:  Upper GI bleed  Sepsis   Patient presenting with fever and coffee-ground emesis.  Tachycardic on arrival.  Lactate is normal.  Covered with flank and Zosyn early for sepsis.  Patient difficult IV stick and therefore a right subclavian central line was placed for access.  Chronic indwelling Foley catheter could be source of infection.  This will be changed out.  Patient be given 2 L  normal saline bolus at this time.  Previous EGD one year ago demonstrated erosive esophagitis.  Patient is on Plavix.  We'll discuss case with GI.  Patient will be admitted to the intensive care unit    Hoy Morn, MD 01/08/14 1626

## 2014-01-08 NOTE — ED Notes (Signed)
Bed: WA20 Expected date:  Expected time:  Means of arrival:  Comments: EMS-vomiting 

## 2014-01-08 NOTE — ED Notes (Signed)
Attempted to insert NG tube, pt's heart rate went up to 190's and he was pulling the tube out. Dr Venora Maples notified and gastric tube insertion held per his verbal order until heart rate stabilizes.

## 2014-01-08 NOTE — ED Notes (Addendum)
Patient from [place] facility with coffee ground emesis which started today.  Alert and oriented per baseline patient c/o abdominal pain.  EMS unable to obtain IV access.  Nursing facility gave IM phenergan 25mg  to patient before he left facility.  Patient hypertensive and tachycardic 02 sats 98% on room air.  Patient arrived with foley catheter.  Patient did not take morning meds.

## 2014-01-08 NOTE — ED Notes (Addendum)
IV RN at bedside as well as hospital phlebotomist. Dr Venora Maples is aware of delay.

## 2014-01-08 NOTE — ED Notes (Signed)
Unable to establish an PIV, second RN assessed, no success. IV team notified.

## 2014-01-08 NOTE — Progress Notes (Signed)
ANTIBIOTIC CONSULT NOTE - INITIAL  Pharmacy Consult for vancomycin and Zosyn Indication: rule out sepsis  No Known Allergies  Patient Measurements: Body weight: 87.4kg on 10/20/13 IBW: 77.6kg  Medical History: Past Medical History  Diagnosis Date  . Dementia   . Stroke   . Hydronephrosis   . Hypertension   . Hyperlipemia   . GERD (gastroesophageal reflux disease)   . BPH (benign prostatic hyperplasia)   . Cardiomyopathy: Per 2 D echo 10/22/13 EF 30-35% 01/08/2014    Medications:  Scheduled:  . metoprolol  5 mg Intravenous 3 times per day   Infusions:  . pantoprazole (PROTONIX) IV 80 mg (01/08/14 1745)  . pantoprozole (PROTONIX) infusion 8 mg/hr (01/08/14 1651)  . vancomycin 1,000 mg (01/08/14 1745)   Assessment: 87 yoM admitted 4/25 from NH w coffee ground emesis and c/o abd pain. Pt noted to have chronic indwelling catheter. Pharmacy has been consulted to dose vancomycin and Zosyn for r/o sepsis. Urinalysis in ED grossly positive for UTI.  Antiinfectives  4/25 >> vancomycin >> 4/25 >> Zosyn >>   Labs / vitals  Tmax: 101.8 WBCs: 11.1 Renal: SCr 1.16 (baseline 1.3-1.5), CrCl 75 ml/min CG, 62 ml/min normalized  Lactic acid: 1.33 (4/25)  Microbiology 4/25 blood x2: ordered 4/25 urine: sent    Goal of Therapy:  Vancomycin trough level 15-20 mcg/ml appropriate Zosyn dosing for renal function and indication  Plan:  - vancomycin 1g IV x1 in addition to 1g IV already administered to equal 2g loading dose - vancomycin 1g IV q12h to start 4/26 at 0600 - Zosyn 3.375G IV q8h, each dose to be infused over 4 hours - vancomycin trough at steady state if indicated - follow-up clinical course, culture results, renal function - follow-up antibiotic de-escalation and length of therapy  Thank you for the consult.  Johny Drilling, PharmD, BCPS Pager: [phone] Pharmacy: [phone] 01/08/2014 6:01 PM

## 2014-01-08 NOTE — H&P (Signed)
[facility] History and Physical  [name] XFG:[id number] DOB: 1946-03-27 DOA: 01/08/2014  Referring physician: Dr. Venora Maples PCP: No primary provider on file.   Chief Complaint: Coffee-ground emesis.  HPI: [name] is a 68 y.o. male  Resident admit to growth with history of stroke with right-sided hemiparesis on Plavix and aspirin, dementia, BPH with chronic indwelling Foley catheter, hypertension, chronic kidney disease, gastroesophageal reflux disease, history of severe ulcerative esophagitis by EGD from March 2014 with biopsy showing nonspecific inflammation who was hospitalized in February of 2015 for hematemesis, presented to the ED with several episodes of coffee-ground emesis. Patient is demented and a such a poor historian and history was obtained from nursing home report and per ED physician. It was noted that patient had coffee-ground emesis on the morning of admission. I am Phenergan was given and patient subsequently had formal episodes of coffee-ground emesis. Patient was noted to be hypertensive with a blood pressure 196/124 pulse of 112 temperature of 99.4. Patient was subsequently sent to the ED for further evaluation and management. In the interview and on review of systems patient answers yes to all questions. Patient was seen in the ED systolic blood pressures noted to be in the 200s. Patient was also noted to be tachycardic with heart rate in the 130s to the 160s. ABG obtained had a pH of 7.45 otherwise was within normal limits.CBC had a white count of 11.1 with a left shift otherwise was within normal limits. Her serum troponin was negative. Lactic acid level was 1.33. Comprehensive metabolic profile obtained had a potassium of 3.4 albumin of 3.2 otherwise was within normal limits. Chest x-ray obtained was negative for any acute infiltrate.   urinalysis obtained from Foley catheter was turbid nitrite negative, large leukocytes, too numerous to count WBCs, many  bacteria. EKG done showed a left bundle branch block with tachycardia. Blood cultures were obtained. Patient was placed on a Protonix drip. Patient was placed on IV vancomycin IV Zosyn. GI was consulted and will assess the patient.   We were called to admit the patient for further evaluation and management.   Review of Systems: Unable to fully assess review of systems as patient has dementia and answers yes to all questions. Constitutional:  No weight loss, night sweats, Fevers, chills, fatigue.  HEENT:  No headaches, Difficulty swallowing,Tooth/dental problems,Sore throat,  No sneezing, itching, ear ache, nasal congestion, post nasal drip,  Cardio-vascular:  No chest pain, Orthopnea, PND, swelling in lower extremities, anasarca, dizziness, palpitations  GI:  No heartburn, indigestion, abdominal pain, nausea, vomiting, diarrhea, change in bowel habits, loss of appetite  Resp:  No shortness of breath with exertion or at rest. No excess mucus, no productive cough, No non-productive cough, No coughing up of blood.No change in color of mucus.No wheezing.No chest wall deformity  Skin:  no rash or lesions.  GU:  no dysuria, change in color of urine, no urgency or frequency. No flank pain.  Musculoskeletal:  No joint pain or swelling. No decreased range of motion. No back pain.  Psych:  No change in mood or affect. No depression or anxiety. No memory loss.   Past Medical History  Diagnosis Date  . Dementia   . Stroke   . Hydronephrosis   . Hypertension   . Hyperlipemia   . GERD (gastroesophageal reflux disease)   . BPH (benign prostatic hyperplasia)   . Cardiomyopathy: Per 2 D echo 10/22/13 EF 30-35% 01/08/2014   Past Surgical History  Procedure Laterality Date  .  Esophagogastroduodenoscopy N/A 11/11/2012    Procedure: ESOPHAGOGASTRODUODENOSCOPY (EGD);  Surgeon: Lear Ng, MD;  Location: [facility] ENDOSCOPY;  Service: Endoscopy;  Laterality: N/A;  . Subxyphoid pericardial window N/A  11/13/2012    Procedure: SUBXYPHOID PERICARDIAL WINDOW;  Surgeon: Grace Isaac, MD;  Location: [facility];  Service: Thoracic;  Laterality: N/A;  . Esophagogastroduodenoscopy N/A 11/25/2012    Procedure: ESOPHAGOGASTRODUODENOSCOPY (EGD);  Surgeon: Winfield Cunas., MD;  Location: [facility] ENDOSCOPY;  Service: Endoscopy;  Laterality: N/A;   Social History:  reports that he has never smoked. He has never used smokeless tobacco. He reports that he does not drink alcohol or use illicit drugs.  No Known Allergies  History reviewed. No pertinent family history.   Prior to Admission medications   Medication Sig Start Date End Date Taking? Authorizing Provider  amLODipine (NORVASC) 10 MG tablet Take 10 mg by mouth daily.   Yes Historical Provider, MD  aspirin 81 MG tablet Take 81 mg by mouth daily.   Yes Historical Provider, MD  atorvastatin (LIPITOR) 20 MG tablet Take 1 tablet (20 mg total) by mouth daily at 6 PM. 12/04/12  Yes Belkys A Regalado, MD  cloNIDine (CATAPRES) 0.2 MG tablet Take 0.2 mg by mouth 2 (two) times daily.   Yes Historical Provider, MD  clopidogrel (PLAVIX) 75 MG tablet Take 75 mg by mouth daily with breakfast.   Yes Historical Provider, MD  docusate sodium (COLACE) 100 MG capsule Take 100 mg by mouth 2 (two) times daily.   Yes Historical Provider, MD  ferrous sulfate 325 (65 FE) MG tablet Take 325 mg by mouth 2 (two) times daily with a meal.   Yes Historical Provider, MD  hydrALAZINE (APRESOLINE) 50 MG tablet Take 50 mg by mouth 3 (three) times daily.   Yes Historical Provider, MD  lactulose (CHRONULAC) 10 GM/15ML solution Take 30 mLs by mouth 2 (two) times daily.   Yes Historical Provider, MD  memantine (NAMENDA) 10 MG tablet Take 10 mg by mouth 2 (two) times daily.   Yes Historical Provider, MD  metoCLOPramide (REGLAN) 10 MG tablet Take 1 tablet (10 mg total) by mouth 4 (four) times daily -  before meals and at bedtime. 10/25/13  Yes Cherene Altes, MD  metoprolol succinate  (TOPROL-XL) 200 MG 24 hr tablet Take 1 tablet (200 mg total) by mouth daily. Take with or immediately following a meal. 11/20/12  Yes Nita Sells, MD  pantoprazole (PROTONIX) 40 MG tablet Take 1 tablet (40 mg total) by mouth 2 (two) times daily before a meal. 10/25/13  Yes Cherene Altes, MD  polyethylene glycol (MIRALAX / GLYCOLAX) packet Take 17 g by mouth daily. 12/04/12  Yes Belkys A Regalado, MD  Polyvinyl Alcohol-Povidone (FRESHKOTE OP) Place 1 drop into both eyes 4 (four) times daily.   Yes Historical Provider, MD  tamsulosin (FLOMAX) 0.4 MG CAPS Take 0.4 mg by mouth daily.   Yes Historical Provider, MD  venlafaxine XR (EFFEXOR-XR) 75 MG 24 hr capsule Take 75 mg by mouth daily with breakfast.   Yes Historical Provider, MD   Physical Exam: Filed Vitals:   01/08/14 1731  BP: 153/87  Pulse: 148  Temp: 100.2 F (37.9 C)  Resp: 22    BP 153/87  Pulse 148  Temp(Src) 100.2 F (37.9 C) (Oral)  Resp 22  SpO2 99%  General:  Appears calm and comfortable Eyes: PERRL, normal lids, irises & conjunctiva ENT: grossly normal hearing, lips & tongue Neck: no LAD, masses or thyromegaly  Cardiovascular: RRR, no m/r/g. No LE edema. Telemetry: SR, no arrhythmias  Respiratory: CTA bilaterally, no w/r/r. Normal respiratory effort. Abdomen: soft, ntnd Skin: no rash or induration seen on limited exam Musculoskeletal: grossly normal tone BUE/BLE Psychiatric: grossly normal mood and affect, speech fluent and appropriate Neurologic: grossly non-focal.          Labs on Admission:  Basic Metabolic Panel:  Recent Labs Lab 01/08/14 1530  NA 145  K 3.4*  CL 106  CO2 23  GLUCOSE 158*  BUN 22  CREATININE 1.16  CALCIUM 9.8   Liver Function Tests:  Recent Labs Lab 01/08/14 1530  AST 15  ALT 8  ALKPHOS 76  BILITOT 0.2*  PROT 7.0  ALBUMIN 3.2*   No results found for this basename: LIPASE, AMYLASE,  in the last 168 hours No results found for this basename: AMMONIA,  in the last  168 hours CBC:  Recent Labs Lab 01/08/14 1530  WBC 11.1*  NEUTROABS 9.0*  HGB 13.9  HCT 42.9  MCV 79.2  PLT 460*   Cardiac Enzymes:  Recent Labs Lab 01/08/14 1530  TROPONINI <0.30    BNP (last 3 results)  Recent Labs  10/20/13 1031  PROBNP 5795.0*   CBG: No results found for this basename: GLUCAP,  in the last 168 hours  Radiological Exams on Admission: Dg Chest Portable 1 View  01/08/2014   CLINICAL DATA:  Hematemesis, fever, central line placement  EXAM: PORTABLE CHEST - 1 VIEW  COMPARISON:  DG CHEST 1V PORT dated 10/20/2013  FINDINGS: Interval placement of a right central venous line with tip in the distal SVC. Cardiac silhouette is enlarged. There are low lung volumes. No pneumothorax.  IMPRESSION: Right central venous line in good position.   Electronically Signed   By: Suzy Bouchard M.D.   On: 01/08/2014 16:09    EKG: Independently reviewed. Tachycardia with probable left bundle branch block similar to prior EKG.   Assessment/Plan Principal Problem:   Sepsis Active Problems:   Dementia   Stroke   GERD (gastroesophageal reflux disease)   UGIB (upper gastrointestinal bleed)   Hemiparesis affecting right side as late effect of stroke   Hypertension, accelerated   BPH (benign prostatic hyperplasia)   Dyslipidemia   Depression   Anemia in chronic kidney disease(285.21)   Chronic kidney disease, stage III (moderate)   Type II or unspecified type diabetes mellitus with renal manifestations, uncontrolled   Tachycardia   Cardiomyopathy: Per 2 D echo 10/22/13 EF 30-35%   Coffee ground emesis   Hypokalemia   #1 sepsis Likely secondary to UTI. Patient with chronic indwelling Foley catheter, and urinalysis obtained was consistent with a UTI. Will change Foley catheter and repeat urinalysis. Blood cultures have been ordered and are pending. Chest x-ray is negative for any acute infiltrate. Lactic acid level is within normal limits. Will check a pro calcitonin level.  We'll place empirically on IV vancomycin and Zosyn. Gentle hydration with IV fluids as patient has a poor ejection fraction and monitor volume status closely. If patient decompensates we'll consult with critical care.  #2 tachycardia/ probable left bundle branch block Likely secondary to sepsis and volume depletion. Hydrate gently with IV fluids. Vision has been pancultured. We'll cycle cardiac enzymes every 6 hours x3. Placed empirically on IV vancomycin and IV Zosyn. Patient was on a beta blocker prior to admission and likely did not receive this secondary to his coffee-ground emesis. We'll place empirically on IV Lopressor and follow.  #3 upper GI  bleed/coffee-ground emesis Questionable etiology. Patient had a similar presentation in February of 2015 which was managed conservatively. Patient was placed on Protonix on discharge. From nursing home records patient still on Protonix. Patient also noted to have a history of ulcerative esophagitis per EGD of March 2014. Will keep patient n.p.o. Placed on a Protonix drip. Hold aspirin and Plavix for now. Gentle hydration. Supportive care. GI has been consulted per ED physician who spoke with Dr. Watt Climes, and patient will be assessed in the morning.   #4 hypokalemia Replete.  #5 dementia Was tolerating oral intake will resume home regimen.  #6 accelerated hypertension/malignant hypertension Will place on IV Lopressor for now. Will continue patient's Flomax and hydralazine. Follow.  #7 gastroesophageal reflux disease PPI.  #8 probable urinary tract infection Urine cultures are pending. When Foley catheter is changed repeat urinalysis. Place empirically on IV Zosyn.  #9 diabetes mellitus Place on a sliding scale insulin.  #10 history of stroke with right-sided hemiparesis Aspirin and Plavix on hold secondary to coffee-ground emesis. Will defer resumption of aspirin and Plavix to GI. PT/OT.  #11 cardiomyopathy Per 2-D echo of February 2015. Will  place patient on IV beta blocker for now. Once patient is tolerating a diet will resume back his Norvasc, Lipitor.  #12 history of BPH status post chronic Foley catheter Will change Foley catheter and repeat urinalysis. Continue home regimen of Flomax.  #13 depression Medications on hold. Once tolerating oral diet will resume.  #14 prophylaxis PPI for GI prophylaxis. SCDs for DVT prophylaxis.    Code Status: Full Family Communication: No family present. Disposition Plan: Admit to ICU  Time spent: 70 mins  Eugenie Filler M.D. [facility] Pager [phone]

## 2014-01-09 ENCOUNTER — Encounter (HOSPITAL_COMMUNITY): Payer: Self-pay | Admitting: Gastroenterology

## 2014-01-09 DIAGNOSIS — L89312 Pressure ulcer of right buttock, stage 2: Secondary | ICD-10-CM | POA: Diagnosis present

## 2014-01-09 DIAGNOSIS — N390 Urinary tract infection, site not specified: Secondary | ICD-10-CM

## 2014-01-09 LAB — COMPREHENSIVE METABOLIC PANEL
ALT: 7 U/L (ref 0–53)
AST: 16 U/L (ref 0–37)
Albumin: 2.8 g/dL — ABNORMAL LOW (ref 3.5–5.2)
Alkaline Phosphatase: 62 U/L (ref 39–117)
BUN: 21 mg/dL (ref 6–23)
CO2: 23 mEq/L (ref 19–32)
Calcium: 8.9 mg/dL (ref 8.4–10.5)
Chloride: 112 mEq/L (ref 96–112)
Creatinine, Ser: 1.28 mg/dL (ref 0.50–1.35)
GFR calc non Af Amer: 56 mL/min — ABNORMAL LOW (ref >90)
GFR, EST AFRICAN AMERICAN: 65 mL/min — AB (ref >90)
GLUCOSE: 127 mg/dL — AB (ref 70–99)
POTASSIUM: 3.6 meq/L — AB (ref 3.7–5.3)
SODIUM: 147 meq/L (ref 137–147)
TOTAL PROTEIN: 5.9 g/dL — AB (ref 6.0–8.3)
Total Bilirubin: 0.3 mg/dL (ref 0.3–1.2)

## 2014-01-09 LAB — CBC
HEMATOCRIT: 39.1 % (ref 39.0–52.0)
HEMOGLOBIN: 12.2 g/dL — AB (ref 13.0–17.0)
MCH: 25.6 pg — ABNORMAL LOW (ref 26.0–34.0)
MCHC: 31.2 g/dL (ref 30.0–36.0)
MCV: 82.1 fL (ref 78.0–100.0)
Platelets: 339 10*3/uL (ref 150–400)
RBC: 4.76 MIL/uL (ref 4.22–5.81)
RDW: 16.1 % — ABNORMAL HIGH (ref 11.5–15.5)
WBC: 8.5 10*3/uL (ref 4.0–10.5)

## 2014-01-09 LAB — GLUCOSE, CAPILLARY
GLUCOSE-CAPILLARY: 126 mg/dL — AB (ref 70–99)
Glucose-Capillary: 133 mg/dL — ABNORMAL HIGH (ref 70–99)
Glucose-Capillary: 114 mg/dL — ABNORMAL HIGH (ref 70–99)
Glucose-Capillary: 200 mg/dL — ABNORMAL HIGH (ref 70–99)

## 2014-01-09 LAB — PROCALCITONIN

## 2014-01-09 LAB — TROPONIN I
Troponin I: <0.3 ng/mL (ref <0.30)
Troponin I: <0.3 ng/mL (ref <0.30)

## 2014-01-09 LAB — TSH: TSH: 0.426 u[IU]/mL (ref 0.350–4.500)

## 2014-01-09 MED ORDER — VANCOMYCIN HCL IN DEXTROSE 750-5 MG/150ML-% IV SOLN
750.0000 mg | Freq: Two times a day (BID) | INTRAVENOUS | Status: DC
  Administered 2014-01-09 – 2014-01-11 (×4): 750 mg via INTRAVENOUS
  Filled 2014-01-09 (×5): qty 150

## 2014-01-09 MED ORDER — DEXTROSE-NACL 5-0.45 % IV SOLN
INTRAVENOUS | Status: DC
  Administered 2014-01-09 – 2014-01-12 (×3): via INTRAVENOUS
  Filled 2014-01-09 (×7): qty 1000

## 2014-01-09 MED ORDER — MAGNESIUM SULFATE 40 MG/ML IJ SOLN
2.0000 g | Freq: Once | INTRAMUSCULAR | Status: AC
  Administered 2014-01-09: 2 g via INTRAVENOUS
  Filled 2014-01-09: qty 50

## 2014-01-09 MED ORDER — POTASSIUM CHLORIDE CRYS ER 20 MEQ PO TBCR: 40.0000 meq | EXTENDED_RELEASE_TABLET | Freq: Once | ORAL | Status: DC

## 2014-01-09 NOTE — Progress Notes (Signed)
ANTIBIOTIC CONSULT NOTE - FOLLOW UP  Pharmacy Consult for vancomycin/zosyn Indication: rule out sepsis - suspected UTI  No Known Allergies  Patient Measurements: Height: 6' 0.05" (183 cm) Weight: 185 lb 3 oz (84 kg) IBW/kg (Calculated) : 77.71 Adjusted Body Weight:   Vital Signs: Temp: 98.4 F (36.9 C) (04/26 0800) Temp src: Oral (04/26 0800) BP: 152/79 mmHg (04/26 0800) Pulse Rate: 121 (04/26 0800) Intake/Output from previous day: 04/25 0701 - 04/26 0700 In: 1292.5 [I.V.:842.5; IV Piggyback:300] Out: 400 [Urine:400] Intake/Output from this shift: Total I/O In: 350 [I.V.:350] Out: 105 [Urine:105]  Labs:  Recent Labs  01/08/14 1530 01/08/14 2120 01/09/14 0400  WBC 11.1* 9.8 8.5  HGB 13.9 13.0 12.2*  PLT 460* 370 339  CREATININE 1.16  --  1.28   Estimated Creatinine Clearance: 60.7 ml/min (by C-G formula based on Cr of 1.28). No results found for this basename: VANCOTROUGH, Corlis Leak, VANCORANDOM, [place], [place], [place], [place], [place], TOBRARND, AMIKACINPEAK, AMIKACINTROU, AMIKACIN,  in the last 72 hours   Microbiology: Recent Results (from the past 720 hour(s))  MRSA PCR SCREENING     Status: None   Collection Time    01/08/14  8:10 PM      Result Value Ref Range Status   MRSA by PCR NEGATIVE  NEGATIVE Final   Comment:            The GeneXpert MRSA Assay (FDA     approved for NASAL specimens     only), is one component of a     comprehensive MRSA colonization     surveillance program. It is not     intended to diagnose MRSA     infection nor to guide or     monitor treatment for     MRSA infections.    Anti-infectives   Start     Dose/Rate Route Frequency Ordered Stop   01/09/14 0600  vancomycin (VANCOCIN) IVPB 1000 mg/200 mL premix     1,000 mg 200 mL/hr over 60 Minutes Intravenous Every 12 hours 01/08/14 1803     01/08/14 2200  piperacillin-tazobactam (ZOSYN) IVPB 3.375 g     3.375 g 12.5 mL/hr over 240 Minutes Intravenous Every  8 hours 01/08/14 1803     01/08/14 1800  vancomycin (VANCOCIN) IVPB 1000 mg/200 mL premix     1,000 mg 200 mL/hr over 60 Minutes Intravenous  Once 01/08/14 1759     01/08/14 1615  piperacillin-tazobactam (ZOSYN) IVPB 3.375 g  Status:  Discontinued     3.375 g 12.5 mL/hr over 240 Minutes Intravenous  Once 01/08/14 1607 01/08/14 1608   01/08/14 1615  vancomycin (VANCOCIN) IVPB 1000 mg/200 mL premix     1,000 mg 200 mL/hr over 60 Minutes Intravenous  Once 01/08/14 1607 01/08/14 1845   01/08/14 1615  piperacillin-tazobactam (ZOSYN) IVPB 3.375 g     3.375 g 100 mL/hr over 30 Minutes Intravenous  Once 01/08/14 1609 01/08/14 1750      Assessment: 34 yoM admitted 4/25 from NH w coffee ground emesis and c/o abd pain. Pt noted to have chronic indwelling catheter. Pharmacy has been consulted to dose vancomycin and Zosyn for r/o sepsis. Urinalysis in ED grossly positive for UTI.   4/25 >> vancomycin >> 4/25 >> Zosyn >>   Tmax: 101.8 WBCs: 11.1 Renal: SCr 1.28 (baseline 1.3-1.5), CrCl 75 ml/min CG, 62 ml/min normalized  Lactic acid: 1.33 (4/25) PCR: <0.1  4/25 blood x2: ordered 4/25 urine: sent   Drug level / dose changes info:  Goal of Therapy:  Vancomycin trough level 15-20 mcg/ml (for UTI - goal 10-74mcg/ml)  Plan:   Due to change in SCr and sepsis likely urinary source, change vancomycin to 750mg  IV q12h  Check vancomycin trough as indicated if remains on vancomycin >= 3 days  Zosyn dose appropriate  Await cultures  [name] 01/09/2014,9:51 AM

## 2014-01-09 NOTE — Progress Notes (Signed)
[facility] PROGRESS NOTE  [name]:[id number] DOB: 11/20/1945 DOA: 01/08/2014 PCP: No primary provider on file.  Assessment/Plan: #1 sepsis Likely secondary to urinary tract infection. Patient with chronic indwelling Foley catheter urinalysis obtained in the ED was consistent with UTI. Foley catheter was changed a repeat urinalysis consistent with UTI. Patient has been pancultured and these are pending. Chest x-ray negative for acute infiltrate. Lactic acid level within normal limits. Pro calcitonin level within normal limits. Continue gentle hydration. Continue IV vancomycin and IV Zosyn. Follow.  #2 tachycardia/probable left bundle branch block Tachycardia likely secondary to sepsis and volume depletion as well as inability to take his beta blocker secondary to his emesis. Heart rate is improved with gentle hydration, IV antibiotics and IV beta blocker. Patient has been pancultured and these are pending. Cardiac enzymes are negative x3. Continue empiric IV vancomycin and IV Zosyn.  #3 urinary tract infection Urinalysis consistent with a UTI even with repeat UA after Foley catheter has been changed. Urine cultures are pending. Continue empiric IV Zosyn.  #4 hypokalemia Replete.  #5 upper GI bleed/coffee-ground emesis Questionable etiology. Patient with prior history of ulcerative esophagitis by EGD of March 2014. Hemoglobin currently 12.2 from 13.9 on admission. Patient currently n.p.o. Patient currently on a Protonix drip. Continue to hold aspirin and Plavix for now and will defer resumption to gastroenterology. Continue gentle hydration. Continue supportive care. GI consultation pending.  #6 dementia Was tolerating oral intake will resume home regimen.  #7 accelerated hypertension/malignant hypertension Improved. Continue IV Lopressor and hydralazine.  #8 gastroesophageal reflux disease PPI.  #9 well controlled diabetes mellitus Hemoglobin A1c was 5.9. CBG is 99.  Continue sliding scale insulin.  #10 history of stroke with right-sided hemiparesis Aspirin and Plavix on hold secondary to coffee-ground emesis and will defer resumption to GI. PT/OT.  #11 cardiomyopathy Per 2-D echo February 2015. Continue IV beta blocker. Once tolerating oral intake will resume home dose Norvasc and Lipitor.  #12 history of BPH status post chronic Foley catheter Foley catheter has been changed. The repeat urinalysis consistent with UTI. Continue Flomax.  #13 depression Once tolerating an oral diet will resume home regimen.  #14 decubitus ulcer of the right buttocks Continue wound care. Frequent turning.  #15 prophylaxis PPI for GI prophylaxis. SCDs for DVT prophylaxis.  Code Status: Full Family Communication: No family at bedside. Disposition Plan: Transfer to step down.   Consultants:  GI pending  Procedures:  Chest x-ray 01/08/2014  Antibiotics:  IV vancomycin 01/08/2014  IV Zosyn 01/08/2014  HPI/Subjective: Patient sleeping. Per nursing no further coffee ground emesis overnight.  Objective: Filed Vitals:   01/09/14 0700  BP: 120/63  Pulse: 100  Temp:   Resp: 16    Intake/Output Summary (Last 24 hours) at 01/09/14 0857 Last data filed at 01/09/14 0800  Gross per 24 hour  Intake 1567.5 ml  Output    505 ml  Net 1062.5 ml   Filed Weights   01/09/14 0500  Weight: 84 kg (185 lb 3 oz)    Exam:   General:  Sleeping.  Cardiovascular: Regular rate rhythm no murmurs rubs or gallops.  Respiratory: Clear to auscultation bilaterally in the anterior lung fields.  Abdomen: Soft, nontender, nondistended, positive bowel sounds.  Musculoskeletal: No clubbing cyanosis or edema. Right hemiparesis.   Data Reviewed: Basic Metabolic Panel:  Recent Labs Lab 01/08/14 1530 01/08/14 2120 01/09/14 0400  NA 145  --  147  K 3.4*  --  3.6*  CL 106  --  112  CO2 23  --  23  GLUCOSE 158*  --  127*  BUN 22  --  21  CREATININE 1.16  --   1.28  CALCIUM 9.8  --  8.9  MG  --  1.8  --    Liver Function Tests:  Recent Labs Lab 01/08/14 1530 01/09/14 0400  AST 15 16  ALT 8 7  ALKPHOS 76 62  BILITOT 0.2* 0.3  PROT 7.0 5.9*  ALBUMIN 3.2* 2.8*   No results found for this basename: LIPASE, AMYLASE,  in the last 168 hours No results found for this basename: AMMONIA,  in the last 168 hours CBC:  Recent Labs Lab 01/08/14 1530 01/08/14 2120 01/09/14 0400  WBC 11.1* 9.8 8.5  NEUTROABS 9.0*  --   --   HGB 13.9 13.0 12.2*  HCT 42.9 40.1 39.1  MCV 79.2 79.4 82.1  PLT 460* 370 339   Cardiac Enzymes:  Recent Labs Lab 01/08/14 1530 01/08/14 2120 01/09/14 0400  TROPONINI <0.30 <0.30 <0.30   BNP (last 3 results)  Recent Labs  10/20/13 1031  PROBNP 5795.0*   CBG:  Recent Labs Lab 01/08/14 2205  GLUCAP 99    Recent Results (from the past 240 hour(s))  MRSA PCR SCREENING     Status: None   Collection Time    01/08/14  8:10 PM      Result Value Ref Range Status   MRSA by PCR NEGATIVE  NEGATIVE Final   Comment:            The GeneXpert MRSA Assay (FDA     approved for NASAL specimens     only), is one component of a     comprehensive MRSA colonization     surveillance program. It is not     intended to diagnose MRSA     infection nor to guide or     monitor treatment for     MRSA infections.     Studies: Dg Chest Portable 1 View  01/08/2014   CLINICAL DATA:  Hematemesis, fever, central line placement  EXAM: PORTABLE CHEST - 1 VIEW  COMPARISON:  DG CHEST 1V PORT dated 10/20/2013  FINDINGS: Interval placement of a right central venous line with tip in the distal SVC. Cardiac silhouette is enlarged. There are low lung volumes. No pneumothorax.  IMPRESSION: Right central venous line in good position.   Electronically Signed   By: Suzy Bouchard M.D.   On: 01/08/2014 16:09    Scheduled Meds: . ferrous sulfate  325 mg Oral BID WC  . hydrALAZINE  50 mg Oral TID  . insulin aspart  0-9 Units Subcutaneous  TID WC  . magnesium sulfate 1 - 4 g bolus IVPB  2 g Intravenous Once  . metoCLOPramide (REGLAN) injection  5 mg Intravenous 4 times per day  . metoprolol  5 mg Intravenous 3 times per day  . piperacillin-tazobactam (ZOSYN)  IV  3.375 g Intravenous Q8H  . sodium chloride  10-40 mL Intracatheter Q12H  . sodium chloride  3 mL Intravenous Q12H  . tamsulosin  0.4 mg Oral Daily  . vancomycin  1,000 mg Intravenous Once  . vancomycin  1,000 mg Intravenous Q12H   Continuous Infusions: . 0.9 % NaCl with KCl 40 mEq / L 75 mL/hr at 01/08/14 2126  . pantoprozole (PROTONIX) infusion 8 mg/hr (01/09/14 0500)    Principal Problem:   Sepsis Active Problems:   Dementia   Stroke   GERD (gastroesophageal reflux  disease)   UGIB (upper gastrointestinal bleed)   Hemiparesis affecting right side as late effect of stroke   Hypertension, accelerated   BPH (benign prostatic hyperplasia)   Dyslipidemia   Depression   Anemia in chronic kidney disease(285.21)   Chronic kidney disease, stage III (moderate)   Type II or unspecified type diabetes mellitus with renal manifestations, uncontrolled   Tachycardia   Cardiomyopathy: Per 2 D echo 10/22/13 EF 30-35%   Coffee ground emesis   Hypokalemia   Decubitus ulcer of right buttock, stage 2   UTI (urinary tract infection)    Time spent: 35 minutes    Eugenie Filler M.D.  [facility] Pager [phone]. If 7PM-7AM, please contact night-coverage at [email or web address], password [facility] 01/09/2014, 8:57 AM  LOS: 1 day

## 2014-01-09 NOTE — Evaluation (Signed)
Physical Therapy Evaluation Patient Details Name: [name] MRN: [id number] DOB: 09-Jun-1946 Today's Date: 01/09/2014   History of Present Illness  68 y.o. male with history of stroke with right-sided hemiparesis on Plavix and aspirin, dementia, BPH with chronic indwelling Foley catheter, hypertension, chronic kidney disease, gastroesophageal reflux disease, history of severe ulcerative esophagitis  admitted with coffee ground emesis. Dx of sepsis, UTI, upper GI bleed.   Clinical Impression  *Pt admitted with upper GI bleed, sepsis, UTI, h/o R hemiparesis**. Pt currently with functional limitations due to the deficits listed below (see PT Problem List).  Pt will benefit from skilled PT to increase their independence and safety with mobility to allow discharge to the venue listed below.    Max assist for supine to sitting on edge of bed where he sat for 7 minutes. Pt unable to provide history due to dementia. He has R hemiparesis from prior CVA. At present a mechanical lift is recommended for bed to recliner transfers. Will follow during acute stay.  **    Follow Up Recommendations SNF;Supervision/Assistance - 24 hour    Equipment Recommendations  Wheelchair (measurements PT);Wheelchair cushion (measurements PT) (if pt doesn't have a WC, need to check with SNF)    Recommendations for Other Services OT consult     Precautions / Restrictions Precautions Precautions: Fall Precaution Comments: R hemiparesis Restrictions Weight Bearing Restrictions: No      Mobility  Bed Mobility Overal bed mobility: Needs Assistance Bed Mobility: Supine to Sit     Supine to sit: Max assist;+2 for physical assistance     General bed mobility comments: pt 40% for supine to sit, assist to advance RLE and to raise trunk  Transfers                 General transfer comment: mechanical lift recommended  Ambulation/Gait                Stairs            Wheelchair  Mobility    Modified Rankin (Stroke Patients Only)       Balance Overall balance assessment: Needs assistance Sitting-balance support: Bilateral upper extremity supported;Feet unsupported Sitting balance-Leahy Scale: Fair                                       Pertinent Vitals/Pain *HR 90 at rest, 115 with activity No c/o pain**    Home Living Family/patient expects to be discharged to:: Skilled nursing facility                      Prior Function Level of Independence: Needs assistance         Comments: pt unable to provide history 2* dementia, but assume pt required assist for transfers and ADLs 2* R sided hemiparesis and dementia     Hand Dominance        Extremity/Trunk Assessment   Upper Extremity Assessment: RUE deficits/detail RUE Deficits / Details: RUE flaccid         Lower Extremity Assessment: RLE deficits/detail;LLE deficits/detail RLE Deficits / Details: RLE flaccid, R ankle DF PROM -15* LLE Deficits / Details: knee extension 4/5  Cervical / Trunk Assessment: Normal  Communication   Communication: No difficulties  Cognition Arousal/Alertness: Awake/alert Behavior During Therapy: WFL for tasks assessed/performed Overall Cognitive Status: History of cognitive impairments - at baseline (not oriented to place, situation, can't  provide history)       Memory: Decreased short-term memory              General Comments      Exercises        Assessment/Plan    PT Assessment Patient needs continued PT services  PT Diagnosis Hemiplegia dominant side;Difficulty walking   PT Problem List Decreased strength;Decreased range of motion;Decreased activity tolerance;Decreased balance;Decreased mobility;Decreased cognition  PT Treatment Interventions DME instruction;Functional mobility training;Therapeutic activities;Therapeutic exercise;Patient/family education;Balance training   PT Goals (Current goals can be found in the  Care Plan section) Acute Rehab PT Goals Patient Stated Goal: to eat PT Goal Formulation: Patient unable to participate in goal setting Time For Goal Achievement: 01/23/14 Potential to Achieve Goals: Fair    Frequency Min 3X/week   Barriers to discharge        Co-evaluation               End of Session   Activity Tolerance: Patient tolerated treatment well Patient left: in bed;with call bell/phone within reach;with bed alarm set Nurse Communication: Mobility status;Need for lift equipment         Time: 1443-1540 PT Time Calculation (min): 19 min   Charges:   PT Evaluation $Initial PT Evaluation Tier I: 1 Procedure PT Treatments $Therapeutic Activity: 8-22 mins   PT G CodesLucile Crater 01/09/2014, 2:34 PM [phone]

## 2014-01-09 NOTE — Consult Note (Signed)
Reason for Consult: Coffee ground emesis Referring Physician: Hospital team  [name] is an 68 y.o. male.  HPI: Patient seen by my partners in the past with recurrent coffee-ground emesis and he is on iron aspirin and Plavix and pump inhibitors and has not had any further signs of bleeding since he's been here and his hospital computer was reviewed and his case was discussed with his nurse and last years 2 endoscopies were reviewed and currently patient has increased lethargy and although he can be aroused he is not answering any questions but I do not know his baseline nor does the nurse  Past Medical History  Diagnosis Date  . Dementia   . Stroke   . Hydronephrosis   . Hypertension   . Hyperlipemia   . GERD (gastroesophageal reflux disease)   . BPH (benign prostatic hyperplasia)   . Cardiomyopathy: Per 2 D echo 10/22/13 EF 30-35% 01/08/2014    Past Surgical History  Procedure Laterality Date  . Esophagogastroduodenoscopy N/A 11/11/2012    Procedure: ESOPHAGOGASTRODUODENOSCOPY (EGD);  Surgeon: Lear Ng, MD;  Location: [facility] ENDOSCOPY;  Service: Endoscopy;  Laterality: N/A;  . Subxyphoid pericardial window N/A 11/13/2012    Procedure: SUBXYPHOID PERICARDIAL WINDOW;  Surgeon: Grace Isaac, MD;  Location: [facility];  Service: Thoracic;  Laterality: N/A;  . Esophagogastroduodenoscopy N/A 11/25/2012    Procedure: ESOPHAGOGASTRODUODENOSCOPY (EGD);  Surgeon: Winfield Cunas., MD;  Location: [facility] ENDOSCOPY;  Service: Endoscopy;  Laterality: N/A;    History reviewed. No pertinent family history.  Social History:  reports that he has never smoked. He has never used smokeless tobacco. He reports that he does not drink alcohol or use illicit drugs.  Allergies: No Known Allergies  Medications: I have reviewed the patient's current medications.  Results for orders placed during the hospital encounter of 01/08/14 (from the past 48 hour(s))  URINALYSIS, ROUTINE W REFLEX MICROSCOPIC      Status: Abnormal   Collection Time    01/08/14  2:50 PM      Result Value Ref Range   Color, Urine YELLOW  YELLOW   APPearance TURBID (*) CLEAR   Specific Gravity, Urine 1.021  1.005 - 1.030   pH 6.5  5.0 - 8.0   Glucose, UA NEGATIVE  NEGATIVE mg/dL   Hgb urine dipstick SMALL (*) NEGATIVE   Bilirubin Urine NEGATIVE  NEGATIVE   Ketones, ur NEGATIVE  NEGATIVE mg/dL   Protein, ur 100 (*) NEGATIVE mg/dL   Urobilinogen, UA 0.2  0.0 - 1.0 mg/dL   Nitrite NEGATIVE  NEGATIVE   Leukocytes, UA LARGE (*) NEGATIVE  URINE MICROSCOPIC-ADD ON     Status: Abnormal   Collection Time    01/08/14  2:50 PM      Result Value Ref Range   WBC, UA TOO NUMEROUS TO COUNT  <3 WBC/hpf   RBC / HPF 3-6  <3 RBC/hpf   Bacteria, UA MANY (*) RARE  CBC WITH DIFFERENTIAL     Status: Abnormal   Collection Time    01/08/14  3:30 PM      Result Value Ref Range   WBC 11.1 (*) 4.0 - 10.5 K/uL   RBC 5.42  4.22 - 5.81 MIL/uL   Hemoglobin 13.9  13.0 - 17.0 g/dL   HCT 42.9  39.0 - 52.0 %   MCV 79.2  78.0 - 100.0 fL   MCH 25.6 (*) 26.0 - 34.0 pg   MCHC 32.4  30.0 - 36.0 g/dL   RDW  16.1 (*) 11.5 - 15.5 %   Platelets 460 (*) 150 - 400 K/uL   Neutrophils Relative % 81 (*) 43 - 77 %   Neutro Abs 9.0 (*) 1.7 - 7.7 K/uL   Lymphocytes Relative 11 (*) 12 - 46 %   Lymphs Abs 1.2  0.7 - 4.0 K/uL   Monocytes Relative 7  3 - 12 %   Monocytes Absolute 0.8  0.1 - 1.0 K/uL   Eosinophils Relative 0  0 - 5 %   Eosinophils Absolute 0.0  0.0 - 0.7 K/uL   Basophils Relative 0  0 - 1 %   Basophils Absolute 0.0  0.0 - 0.1 K/uL  COMPREHENSIVE METABOLIC PANEL     Status: Abnormal   Collection Time    01/08/14  3:30 PM      Result Value Ref Range   Sodium 145  137 - 147 mEq/L   Potassium 3.4 (*) 3.7 - 5.3 mEq/L   Chloride 106  96 - 112 mEq/L   CO2 23  19 - 32 mEq/L   Glucose, Bld 158 (*) 70 - 99 mg/dL   BUN 22  6 - 23 mg/dL   Creatinine, Ser 1.16  0.50 - 1.35 mg/dL   Calcium 9.8  8.4 - 10.5 mg/dL   Total Protein 7.0  6.0 -  8.3 g/dL   Albumin 3.2 (*) 3.5 - 5.2 g/dL   AST 15  0 - 37 U/L   ALT 8  0 - 53 U/L   Alkaline Phosphatase 76  39 - 117 U/L   Total Bilirubin 0.2 (*) 0.3 - 1.2 mg/dL   GFR calc non Af Amer 63 (*) >90 mL/min   GFR calc Af Amer 73 (*) >90 mL/min   Comment: (NOTE)     The eGFR has been calculated using the CKD EPI equation.     This calculation has not been validated in all clinical situations.     eGFR's persistently <90 mL/min signify possible Chronic Kidney     Disease.  PROTIME-INR     Status: None   Collection Time    01/08/14  3:30 PM      Result Value Ref Range   Prothrombin Time 13.4  11.6 - 15.2 seconds   INR 1.04  0.00 - 1.49  TROPONIN I     Status: None   Collection Time    01/08/14  3:30 PM      Result Value Ref Range   Troponin I <0.30  <0.30 ng/mL   Comment:            Due to the release kinetics of cTnI,     a negative result within the first hours     of the onset of symptoms does not rule out     myocardial infarction with certainty.     If myocardial infarction is still suspected,     repeat the test at appropriate intervals.  PROCALCITONIN     Status: None   Collection Time    01/08/14  3:30 PM      Result Value Ref Range   Procalcitonin <0.10     Comment:            Interpretation:     PCT (Procalcitonin) <= 0.5 ng/mL:     Systemic infection (sepsis) is not likely.     Local bacterial infection is possible.     (NOTE)             ICU PCT  Algorithm               Non ICU PCT Algorithm        ----------------------------     ------------------------------             PCT < 0.25 ng/mL                 PCT < 0.1 ng/mL         Stopping of antibiotics            Stopping of antibiotics           strongly encouraged.               strongly encouraged.        ----------------------------     ------------------------------           PCT level decrease by               PCT < 0.25 ng/mL           >= 80% from peak PCT           OR PCT 0.25 - 0.5 ng/mL           Stopping of antibiotics                                                 encouraged.         Stopping of antibiotics               encouraged.        ----------------------------     ------------------------------           PCT level decrease by              PCT >= 0.25 ng/mL           < 80% from peak PCT            AND PCT >= 0.5 ng/mL            Continuing antibiotics                                                  encouraged.           Continuing antibiotics                encouraged.        ----------------------------     ------------------------------         PCT level increase compared          PCT > 0.5 ng/mL             with peak PCT AND              PCT >= 0.5 ng/mL             Escalation of antibiotics                                              strongly encouraged.          Escalation of antibiotics  strongly encouraged.  I-STAT CG4 LACTIC ACID, ED     Status: None   Collection Time    01/08/14  3:50 PM      Result Value Ref Range   Lactic Acid, Venous 1.33  0.5 - 2.2 mmol/L  BLOOD GAS, ARTERIAL     Status: Abnormal   Collection Time    01/08/14  4:40 PM      Result Value Ref Range   FIO2 0.21     pH, Arterial 7.452 (*) 7.350 - 7.450   pCO2 arterial 35.5  35.0 - 45.0 mmHg   pO2, Arterial 84.7  80.0 - 100.0 mmHg   Bicarbonate 24.0  20.0 - 24.0 mEq/L   TCO2 20.7  0 - 100 mmol/L   Acid-Base Excess 1.5  0.0 - 2.0 mmol/L   O2 Saturation 95.2     Patient temperature 101.8     Collection site LEFT RADIAL     Drawn by [id number]     Sample type ARTERIAL DRAW     Allens test (pass/fail) PASS  PASS  MRSA PCR SCREENING     Status: None   Collection Time    01/08/14  8:10 PM      Result Value Ref Range   MRSA by PCR NEGATIVE  NEGATIVE   Comment:            The GeneXpert MRSA Assay (FDA     approved for NASAL specimens     only), is one component of a     comprehensive MRSA colonization     surveillance program. It is not     intended to diagnose MRSA      infection nor to guide or     monitor treatment for     MRSA infections.  MAGNESIUM     Status: None   Collection Time    01/08/14  9:20 PM      Result Value Ref Range   Magnesium 1.8  1.5 - 2.5 mg/dL  TSH     Status: None   Collection Time    01/08/14  9:20 PM      Result Value Ref Range   TSH 0.426  0.350 - 4.500 uIU/mL   Comment: Please note change in reference range.     Performed at [facility]  CBC     Status: Abnormal   Collection Time    01/08/14  9:20 PM      Result Value Ref Range   WBC 9.8  4.0 - 10.5 K/uL   RBC 5.05  4.22 - 5.81 MIL/uL   Hemoglobin 13.0  13.0 - 17.0 g/dL   HCT 40.1  39.0 - 52.0 %   MCV 79.4  78.0 - 100.0 fL   MCH 25.7 (*) 26.0 - 34.0 pg   MCHC 32.4  30.0 - 36.0 g/dL   RDW 16.0 (*) 11.5 - 15.5 %   Platelets 370  150 - 400 K/uL  TROPONIN I     Status: None   Collection Time    01/08/14  9:20 PM      Result Value Ref Range   Troponin I <0.30  <0.30 ng/mL   Comment:            Due to the release kinetics of cTnI,     a negative result within the first hours     of the onset of symptoms does not rule out     myocardial infarction with certainty.  If myocardial infarction is still suspected,     repeat the test at appropriate intervals.  URINALYSIS, ROUTINE W REFLEX MICROSCOPIC     Status: Abnormal   Collection Time    01/08/14  9:37 PM      Result Value Ref Range   Color, Urine YELLOW  YELLOW   APPearance TURBID (*) CLEAR   Specific Gravity, Urine 1.029  1.005 - 1.030   pH 5.5  5.0 - 8.0   Glucose, UA NEGATIVE  NEGATIVE mg/dL   Hgb urine dipstick MODERATE (*) NEGATIVE   Bilirubin Urine NEGATIVE  NEGATIVE   Ketones, ur NEGATIVE  NEGATIVE mg/dL   Protein, ur 100 (*) NEGATIVE mg/dL   Urobilinogen, UA 0.2  0.0 - 1.0 mg/dL   Nitrite POSITIVE (*) NEGATIVE   Leukocytes, UA LARGE (*) NEGATIVE  URINE MICROSCOPIC-ADD ON     Status: Abnormal   Collection Time    01/08/14  9:37 PM      Result Value Ref Range   WBC, UA TOO NUMEROUS TO  COUNT  <3 WBC/hpf   RBC / HPF FIELD OBSCURED BY WBC'S  <3 RBC/hpf   Bacteria, UA MANY (*) RARE  GLUCOSE, CAPILLARY     Status: None   Collection Time    01/08/14 10:05 PM      Result Value Ref Range   Glucose-Capillary 99  70 - 99 mg/dL   Comment 1 Documented in Chart     Comment 2 Notify RN    PROCALCITONIN     Status: None   Collection Time    01/09/14  4:00 AM      Result Value Ref Range   Procalcitonin <0.10     Comment:            Interpretation:     PCT (Procalcitonin) <= 0.5 ng/mL:     Systemic infection (sepsis) is not likely.     Local bacterial infection is possible.     (NOTE)             ICU PCT Algorithm               Non ICU PCT Algorithm        ----------------------------     ------------------------------             PCT < 0.25 ng/mL                 PCT < 0.1 ng/mL         Stopping of antibiotics            Stopping of antibiotics           strongly encouraged.               strongly encouraged.        ----------------------------     ------------------------------           PCT level decrease by               PCT < 0.25 ng/mL           >= 80% from peak PCT           OR PCT 0.25 - 0.5 ng/mL          Stopping of antibiotics  encouraged.         Stopping of antibiotics               encouraged.        ----------------------------     ------------------------------           PCT level decrease by              PCT >= 0.25 ng/mL           < 80% from peak PCT            AND PCT >= 0.5 ng/mL            Continuing antibiotics                                                  encouraged.           Continuing antibiotics                encouraged.        ----------------------------     ------------------------------         PCT level increase compared          PCT > 0.5 ng/mL             with peak PCT AND              PCT >= 0.5 ng/mL             Escalation of antibiotics                                               strongly encouraged.          Escalation of antibiotics            strongly encouraged.  COMPREHENSIVE METABOLIC PANEL     Status: Abnormal   Collection Time    01/09/14  4:00 AM      Result Value Ref Range   Sodium 147  137 - 147 mEq/L   Potassium 3.6 (*) 3.7 - 5.3 mEq/L   Chloride 112  96 - 112 mEq/L   CO2 23  19 - 32 mEq/L   Glucose, Bld 127 (*) 70 - 99 mg/dL   BUN 21  6 - 23 mg/dL   Creatinine, Ser 1.28  0.50 - 1.35 mg/dL   Calcium 8.9  8.4 - 10.5 mg/dL   Total Protein 5.9 (*) 6.0 - 8.3 g/dL   Albumin 2.8 (*) 3.5 - 5.2 g/dL   AST 16  0 - 37 U/L   ALT 7  0 - 53 U/L   Alkaline Phosphatase 62  39 - 117 U/L   Total Bilirubin 0.3  0.3 - 1.2 mg/dL   GFR calc non Af Amer 56 (*) >90 mL/min   GFR calc Af Amer 65 (*) >90 mL/min   Comment: (NOTE)     The eGFR has been calculated using the CKD EPI equation.     This calculation has not been validated in all clinical situations.     eGFR's persistently <90 mL/min signify possible Chronic Kidney     Disease.  CBC     Status: Abnormal   Collection Time    01/09/14  4:00 AM      Result Value Ref Range   WBC 8.5  4.0 - 10.5 K/uL   RBC 4.76  4.22 - 5.81 MIL/uL   Hemoglobin 12.2 (*) 13.0 - 17.0 g/dL   HCT 39.1  39.0 - 52.0 %   MCV 82.1  78.0 - 100.0 fL   MCH 25.6 (*) 26.0 - 34.0 pg   MCHC 31.2  30.0 - 36.0 g/dL   RDW 16.1 (*) 11.5 - 15.5 %   Platelets 339  150 - 400 K/uL  TROPONIN I     Status: None   Collection Time    01/09/14  4:00 AM      Result Value Ref Range   Troponin I <0.30  <0.30 ng/mL   Comment:            Due to the release kinetics of cTnI,     a negative result within the first hours     of the onset of symptoms does not rule out     myocardial infarction with certainty.     If myocardial infarction is still suspected,     repeat the test at appropriate intervals.    Dg Chest Portable 1 View  01/08/2014   CLINICAL DATA:  Hematemesis, fever, central line placement  EXAM: PORTABLE CHEST - 1 VIEW  COMPARISON:  DG  CHEST 1V PORT dated 10/20/2013  FINDINGS: Interval placement of a right central venous line with tip in the distal SVC. Cardiac silhouette is enlarged. There are low lung volumes. No pneumothorax.  IMPRESSION: Right central venous line in good position.   Electronically Signed   By: Suzy Bouchard M.D.   On: 01/08/2014 16:09    ROS unable to obtain currently Blood pressure 171/65, pulse 88, temperature 98.4 F (36.9 C), temperature source Oral, resp. rate 21, height 6' 0.05" (1.83 m), weight 84 kg (185 lb 3 oz), SpO2 95.00%. Physical Exam vital signs stable afebrile no acute distress abdomen is soft nontender hemoglobin fairly stable BUN and creatinine okay  Assessment/Plan: Multiple medical problem with seemingly self-limited hematemesis which seems to be a recurrent problem Plan: Will ask my partner to see him early next week to decide if an endoscopy is needed however I would reevaluate his need for both aspirin and Plavix and continue pump inhibitor and may have clear liquid diet if he is able to swallow and please call us sooner when necessary signs of bleeding  Jeryl Columbia 01/09/2014, 10:15 AM

## 2014-01-10 DIAGNOSIS — K920 Hematemesis: Secondary | ICD-10-CM

## 2014-01-10 DIAGNOSIS — D62 Acute posthemorrhagic anemia: Secondary | ICD-10-CM | POA: Diagnosis present

## 2014-01-10 DIAGNOSIS — R000 Tachycardia, unspecified: Secondary | ICD-10-CM

## 2014-01-10 DIAGNOSIS — I69959 Hemiplegia and hemiparesis following unspecified cerebrovascular disease affecting unspecified side: Secondary | ICD-10-CM

## 2014-01-10 DIAGNOSIS — I69920 Aphasia following unspecified cerebrovascular disease: Secondary | ICD-10-CM

## 2014-01-10 DIAGNOSIS — R1310 Dysphagia, unspecified: Secondary | ICD-10-CM | POA: Diagnosis present

## 2014-01-10 DIAGNOSIS — R7881 Bacteremia: Secondary | ICD-10-CM | POA: Diagnosis present

## 2014-01-10 DIAGNOSIS — I10 Essential (primary) hypertension: Secondary | ICD-10-CM

## 2014-01-10 LAB — BASIC METABOLIC PANEL
BUN: 12 mg/dL (ref 6–23)
CALCIUM: 8.9 mg/dL (ref 8.4–10.5)
CHLORIDE: 111 meq/L (ref 96–112)
CO2: 24 mEq/L (ref 19–32)
Creatinine, Ser: 1.39 mg/dL — ABNORMAL HIGH (ref 0.50–1.35)
GFR calc Af Amer: 59 mL/min — ABNORMAL LOW (ref >90)
GFR calc non Af Amer: 51 mL/min — ABNORMAL LOW (ref >90)
Glucose, Bld: 139 mg/dL — ABNORMAL HIGH (ref 70–99)
Potassium: 3.9 mEq/L (ref 3.7–5.3)
Sodium: 144 mEq/L (ref 137–147)

## 2014-01-10 LAB — CBC
HEMATOCRIT: 31 % — AB (ref 39.0–52.0)
HEMOGLOBIN: 9.6 g/dL — AB (ref 13.0–17.0)
MCH: 25.3 pg — AB (ref 26.0–34.0)
MCHC: 31 g/dL (ref 30.0–36.0)
MCV: 81.8 fL (ref 78.0–100.0)
Platelets: 229 10*3/uL (ref 150–400)
RBC: 3.79 MIL/uL — ABNORMAL LOW (ref 4.22–5.81)
RDW: 16.3 % — AB (ref 11.5–15.5)
WBC: 6.4 10*3/uL (ref 4.0–10.5)

## 2014-01-10 LAB — MAGNESIUM: Magnesium: 2 mg/dL (ref 1.5–2.5)

## 2014-01-10 LAB — GLUCOSE, CAPILLARY
Glucose-Capillary: 147 mg/dL — ABNORMAL HIGH (ref 70–99)
Glucose-Capillary: 150 mg/dL — ABNORMAL HIGH (ref 70–99)
Glucose-Capillary: 91 mg/dL (ref 70–99)

## 2014-01-10 MED ORDER — PANTOPRAZOLE SODIUM 40 MG IV SOLR
40.0000 mg | Freq: Two times a day (BID) | INTRAVENOUS | Status: DC
  Administered 2014-01-10 – 2014-01-20 (×21): 40 mg via INTRAVENOUS
  Filled 2014-01-10 (×23): qty 40

## 2014-01-10 NOTE — Clinical Documentation Improvement (Addendum)
Query 1 of 2  "Cardiomyopathy: Per 2 D echo 10/22/13 EF 30-35%" documented in H&P  Home Medications include:  - Apresoline  - ToprolXL  Possible Clinical Conditions:   - Chronic Systolic Heart failure, stable     - Other Condition   - Unable to Clinically Determine   Query 2 of 2  EGD 01/13/14 -  FINDINGS:no evidence of active bleeding  Esophagus: Distal ulcerative, erosive esophagitis  Stomach: Normal  Duodenum: Normal  COMPLICATIONS:no endoscopic complications  Please document the Possible, Probable, Suspected or Known cause of the patient's hematemesis.  Thank You,  Erling Conte ,RN BSN CCDS Certified Clinical Documentation Specialist:  [facility]

## 2014-01-10 NOTE — Consult Note (Signed)
[facility]    Date of Admission:  01/08/2014           Day 3 vancomycin        Day 3 piperacillin tazobactam       Reason for Consult: UTI and positive blood culture    Referring Physician: Dr. Irine Seal   Principal Problem:   Sepsis Active Problems:   UTI (urinary tract infection)   Bacteremia   Dementia   Stroke   GERD (gastroesophageal reflux disease)   UGIB (upper gastrointestinal bleed)   Hemiparesis affecting right side as late effect of stroke   Expressive aphasia   Hypertension, accelerated   BPH (benign prostatic hyperplasia)   Dyslipidemia   Depression   Anemia in chronic kidney disease(285.21)   Chronic kidney disease, stage III (moderate)   Type II or unspecified type diabetes mellitus with renal manifestations, uncontrolled   Tachycardia   Cardiomyopathy: Per 2 D echo 10/22/13 EF 30-35%   Coffee ground emesis   Hypokalemia   Decubitus ulcer of right buttock, stage 2   Acute blood loss anemia   Dysphagia, unspecified(787.20)   . ferrous sulfate  325 mg Oral BID WC  . hydrALAZINE  50 mg Oral TID  . insulin aspart  0-9 Units Subcutaneous TID WC  . metoCLOPramide (REGLAN) injection  5 mg Intravenous 4 times per day  . metoprolol  5 mg Intravenous 3 times per day  . pantoprazole (PROTONIX) IV  40 mg Intravenous Q12H  . piperacillin-tazobactam (ZOSYN)  IV  3.375 g Intravenous Q8H  . sodium chloride  10-40 mL Intracatheter Q12H  . sodium chloride  3 mL Intravenous Q12H  . tamsulosin  0.4 mg Oral Daily  . vancomycin  1,000 mg Intravenous Once  . vancomycin  750 mg Intravenous Q12H    Recommendations: 1. Continue current antibiotics pending final culture results   Assessment: At this point, I cannot determine if the one positive blood culture represents true bacteremia or an insignificant contaminant. Likewise, is positive urine culture could reflect colonization related to his chronic indwelling Foley catheter  but I, too, suspected he had asymptomatic urinary tract infection on admission and that he is improving with current empiric antibiotic therapy. I will check culture results and followup tomorrow.   HPI: [name] is a 68 y.o. male with a remote history of a CVA resulting in right hemiparesis and aphasia. He developed hematemesis, hypertension and tachycardia and was admitted 2 days ago. Too numerous to count white blood cells were noted on his urinalysis. He was started on empiric antibiotic therapy. He has improved since admission. He is more alert and interactive. His urine cultures growing greater than 100,000 colonies of gram-negative rods. One set of blood cultures is growing gram-positive cocci in clusters. Although 2 sets of blood cultures were ordered only one set was obtained.   Review of Systems: Review of systems not obtained due to patient factors.  Past Medical History  Diagnosis Date  . Dementia   . Stroke   . Hydronephrosis   . Hypertension   . Hyperlipemia   . GERD (gastroesophageal reflux disease)   . BPH (benign prostatic hyperplasia)   . Cardiomyopathy: Per 2 D echo 10/22/13 EF 30-35% 01/08/2014    History  Substance Use Topics  . Smoking status: Never Smoker   . Smokeless tobacco: Never Used  . Alcohol Use: No    History reviewed. No pertinent family history.  No Known Allergies  OBJECTIVE: Blood pressure 129/60, pulse 115, temperature 97.8 F (36.6 C), temperature source Oral, resp. rate 24, height 6' 0.05" (1.83 m), weight 188 lb 15 oz (85.7 kg), SpO2 100.00%. General: He is alert. He answers yes to most questions. He smiles and laughs. Left skull defect Skin: No rash Oral: Multiple missing teeth Lungs: Clear Cor: Regular S1 and S2 with no murmur Abdomen: Soft and nontender. No diarrhea  Lab Results Lab Results  Component Value Date   WBC 6.4 01/10/2014   HGB 9.6* 01/10/2014   HCT 31.0* 01/10/2014   MCV 81.8 01/10/2014   PLT 229 01/10/2014    Lab  Results  Component Value Date   CREATININE 1.39* 01/10/2014   BUN 12 01/10/2014   NA 144 01/10/2014   K 3.9 01/10/2014   CL 111 01/10/2014   CO2 24 01/10/2014    Lab Results  Component Value Date   ALT 7 01/09/2014   AST 16 01/09/2014   ALKPHOS 62 01/09/2014   BILITOT 0.3 01/09/2014     Microbiology: Recent Results (from the past 240 hour(s))  URINE CULTURE     Status: None   Collection Time    01/08/14  2:50 PM      Result Value Ref Range Status   Specimen Description URINE, CATHETERIZED   Final   Special Requests NONE   Final   Culture  Setup Time     Final   Value: 01/08/2014 17:48     Performed at [facility]     Final   Value: >=100,000 COLONIES/ML     Performed at [facility]   Culture     Final   Value: [place]     Performed at [facility]   Report Status PENDING   Incomplete  CULTURE, BLOOD (ROUTINE X 2)     Status: None   Collection Time    01/08/14  3:30 PM      Result Value Ref Range Status   Specimen Description BLOOD LEFT ANTECUBITAL   Final   Special Requests BOTTLES DRAWN AEROBIC AND ANAEROBIC 2CC   Final   Culture  Setup Time     Final   Value: 01/08/2014 21:32     Performed at [facility]   Culture     Final   Value: GRAM POSITIVE COCCI IN CLUSTERS     Note: Gram Stain Report Called to,Read Back By and Verified With: Carlyn Reichert 01/09/14 @ 5PM BY [name]     Performed at [facility]   Report Status PENDING   Incomplete  MRSA PCR SCREENING     Status: None   Collection Time    01/08/14  8:10 PM      Result Value Ref Range Status   MRSA by PCR NEGATIVE  NEGATIVE Final   Comment:            The GeneXpert MRSA Assay (FDA     approved for NASAL specimens     only), is one component of a     comprehensive MRSA colonization     surveillance program. It is not     intended to diagnose MRSA     infection nor to guide or     monitor treatment for     MRSA infections.  URINE CULTURE      Status: None   Collection Time    01/08/14  9:38 PM  Result Value Ref Range Status   Specimen Description URINE, RANDOM   Final   Special Requests NONE   Final   Culture  Setup Time     Final   Value: 01/09/2014 03:53     Performed at [facility] PENDING   Incomplete   Culture     Final   Value: Culture reincubated for better growth     Performed at [facility]   Report Status PENDING   Incomplete    Michel Bickers, [place] [facility] [phone] pager   [phone] cell 01/10/2014, 5:33 PM

## 2014-01-10 NOTE — Evaluation (Signed)
Clinical/Bedside Swallow Evaluation Patient Details  Name: [name] MRN: [id number] Date of Birth: Dec 13, 1945  Today's Date: 01/10/2014 Time: 1330-1400 SLP Time Calculation (min): 30 min  Past Medical History:  Past Medical History  Diagnosis Date  . Dementia   . Stroke   . Hydronephrosis   . Hypertension   . Hyperlipemia   . GERD (gastroesophageal reflux disease)   . BPH (benign prostatic hyperplasia)   . Cardiomyopathy: Per 2 D echo 10/22/13 EF 30-35% 01/08/2014   Past Surgical History:  Past Surgical History  Procedure Laterality Date  . Esophagogastroduodenoscopy N/A 11/11/2012    Procedure: ESOPHAGOGASTRODUODENOSCOPY (EGD);  Surgeon: Lear Ng, MD;  Location: [facility] ENDOSCOPY;  Service: Endoscopy;  Laterality: N/A;  . Subxyphoid pericardial window N/A 11/13/2012    Procedure: SUBXYPHOID PERICARDIAL WINDOW;  Surgeon: Grace Isaac, MD;  Location: [place];  Service: Thoracic;  Laterality: N/A;  . Esophagogastroduodenoscopy N/A 11/25/2012    Procedure: ESOPHAGOGASTRODUODENOSCOPY (EGD);  Surgeon: Winfield Cunas., MD;  Location: [facility] ENDOSCOPY;  Service: Endoscopy;  Laterality: N/A;   HPI:  68 yo male adm to [facility] with coffee ground emesis from SNF ([place]).  Pt has extensive hx including left CVA with aphasia, dysarthria and right hemiparesis, gastroparesis, reflux, esophagits, paralytic ileus, dementia.  Pt recently admitted to hospital Feb 2015 with vomiting blood.  Pt is on a regular/thin diet at SNF and was discharged from SLP caseload on March 9th per SLP conversation with SNF rehab staff.  Bedside swallow evaluation ordered in hospital.  RN reports pt choked on liquids today but tolerated jello well.        Assessment / Plan / Recommendation Clinical Impression  Pt with symptoms of mild-moderate oral dysphagia resulting in decreased lingual coordination/delayed transit- ? if could be multfactorial - (consistent with tardive dyskinesia? and/or due to CVA).   Premature spillage of liquids into pharynx may occur with occasional aspiration but not observed during evaluation today.  Suspect possible delayed pharyngeal swallow initiation but no indications of immediate aspiration.     Pt did cough x1 strongly - not coorelated to po intake- ? due to secretion retention/aspiration and/or extra esophageal symptom.  SLP tested various consistencies with pt including nectar, thin, applesauce with medicine and small bite of cracker.  Pt did not indicate thickened liquids were easier to swallow and given good tolerance of thin during session- SLP recommends to continue clears/thin.    When GI indicates appropriate to advance, recommend consider dys3/ground meats/thin due to acute illness and premorbid cva//motor planning deficits.  SLP educated RN and pt to recommendations, SLP to follow briefly for po tolerance and education.      Aspiration Risk    ongoing due to multifactorial dysphagia   Diet Recommendation Thin liquid (clears)   Liquid Administration via: Cup;Straw Medication Administration: Crushed with puree Supervision: Staff to assist with self feeding;Full supervision/cueing for compensatory strategies Compensations: Slow rate;Small sips/bites;Check for pocketing Postural Changes and/or Swallow Maneuvers: Seated upright 90 degrees;Upright 30-60 min after meal    Other  Recommendations Oral Care Recommendations: Oral care before and after PO (check for oral stasis due to right facial, lingual weakness)   Follow Up Recommendations  Other (comment) (TBD)    Frequency and Duration min 2x/week  1 week   Pertinent Vitals/Pain Afebrile, decreased     Swallow Study Prior Functional Status   resident of [place] for many years, see [place] Date of Onset: 01/10/14 HPI: 68 yo male  adm to [facility] with coffee ground emesis from SNF ([place]).  Pt has extensive hx including left CVA with aphasia, dysarthria and right hemiparesis,  gastroparesis, reflux, esophagits, paralytic ileus, dementia.  Pt recently admitted to hospital Feb 2015 with vomiting blood.  Pt is on a regular/thin diet at SNF and was discharged from SLP caseload on March 9th.  Bedside swallow evaluation ordered in hospital.  RN reports pt choked on liquids today but tolerated jello well.      Type of Study: Bedside swallow evaluation Diet Prior to this Study:  (clears) Temperature Spikes Noted: No Respiratory Status: Room air History of Recent Intubation: No Behavior/Cognition: Alert;Cooperative Oral Cavity - Dentition:  (poor dentition condition, many teeth appeared decayed) Self-Feeding Abilities: Needs set up Patient Positioning: Upright in bed Baseline Vocal Quality: Clear Volitional Cough: Cognitively unable to elicit;Weak (pt with motor planning deficits impacting volitional cough) Volitional Swallow: Unable to elicit    Oral/Motor/Sensory Function Overall Oral Motor/Sensory Function: Impaired at baseline (right weakness due to h/o CVA, pt reports he was ambidexterous prior to cva, lingual discoordination noted - ?  some tardive dyskinesia issues) Labial ROM: Reduced right Labial Symmetry: Abnormal symmetry right Labial Strength: Reduced Lingual ROM: Reduced right;Reduced left Lingual Strength: Reduced Lingual Sensation: Reduced Facial ROM: Reduced right Facial Symmetry: Right droop Velum: Within Functional Limits Mandible: Within Functional Limits   Ice Chips Ice chips: Impaired Oral Phase Impairments: Reduced lingual movement/coordination;Impaired anterior to posterior transit Oral Phase Functional Implications: Prolonged oral transit Pharyngeal Phase Impairments: Suspected delayed Swallow   Thin Liquid Thin Liquid: Impaired Presentation: Cup;Straw;Self Fed Oral Phase Impairments: Impaired anterior to posterior transit;Reduced lingual movement/coordination Oral Phase Functional Implications: Prolonged oral transit Pharyngeal  Phase  Impairments: Suspected delayed Swallow    Nectar Thick Nectar Thick Liquid: Impaired Oral Phase Impairments: Impaired anterior to posterior transit;Reduced lingual movement/coordination Oral phase functional implications: Prolonged oral transit Pharyngeal Phase Impairments: Suspected delayed Swallow   Honey Thick Honey Thick Liquid: Not tested   Puree Puree: Impaired Presentation: Spoon Oral Phase Impairments: Impaired anterior to posterior transit;Reduced lingual movement/coordination Oral Phase Functional Implications: Prolonged oral transit Pharyngeal Phase Impairments: Suspected delayed Swallow   Solid   GO    Solid: Impaired Presentation: Self Fed Oral Phase Impairments: Reduced lingual movement/coordination;Impaired anterior to posterior transit Oral Phase Functional Implications: Other (comment) (delayed oral transiting but no oral pocketing - ) Pharyngeal Phase Impairments: Suspected delayed [place], [facility] SLP [phone]

## 2014-01-10 NOTE — Progress Notes (Signed)
Clinical Social Work Department BRIEF PSYCHOSOCIAL ASSESSMENT 01/10/2014  Patient:  [name], [name]     Account Number:  [id number]     Admit date:  01/08/2014  Clinical Social Worker:  Ulyess Blossom  Date/Time:  01/10/2014 10:22 AM  Referred by:  Physician  Date Referred:  01/10/2014 Referred for  SNF Placement   Other Referral:   Interview type:  Family Other interview type:    PSYCHOSOCIAL DATA Living Status:  FACILITY Admitted from facility:  [facility] Level of care:  [place] Primary support name:  Iverson Sees ([phone]) Primary support relationship to patient:  SIBLING Degree of support available:   Good--pt has lived at [facility] for the last 8 or 9 years, per brother.    CURRENT CONCERNS Current Concerns  Post-Acute Placement   Other Concerns:    SOCIAL WORK ASSESSMENT / PLAN CSW received referral that pt admitted from [facility].    CSW spoke with RN who reports that pt alert and oriented x 2 and has expressive aphasia and RN recommended to contact pt brother.    CSW contacted pt brother via telephone. Pt brother confirmed pt is from [facility]. Pt brother confirmed that plan is for pt to return to [facility] and would like for pt to return to the room he was in prior to admission. CSW discussed that CSW will be in communication with [facility] regarding pt return. Pt brother expressed understanding and appreciation.    CSW called [facility] and left message for admissions. CSW completed FL2 and sent clinicals to [facility].    CSW to continue to follow and assist with pt return to [facility] when pt medically ready for discharge.   Assessment/plan status:  Psychosocial Support/Ongoing Assessment of Needs Other assessment/ plan:   discharge planning   Information/referral to community resources:   Referral back to [facility].    PATIENT'S/FAMILY'S RESPONSE TO PLAN OF CARE: Pt's brother friendly in  phone conversation with CSW and explained he has no concerns/questions at this time. Pt brother interested in pt returning to the room he was in prior to hospitalization. Pt brother appears supportive and involved in pt care.    Alison Murray, MSW, [place] Work [phone]

## 2014-01-10 NOTE — Progress Notes (Signed)
[facility] PROGRESS NOTE  NATION KACKLEY [id number] DOB: 1946-06-06 DOA: 01/08/2014 PCP: No primary provider on file.  Assessment/Plan: #1 sepsis/ prob bacteremia Likely secondary to urinary tract infection and bacteremia. Patient with chronic indwelling Foley catheter urinalysis obtained in the ED was consistent with UTI. Foley catheter was changed a repeat urinalysis consistent with UTI. Patient has been pancultured and these are pending. Prelim blood cultures with 1/2 GPC in clusters. Chest x-ray negative for acute infiltrate. Lactic acid level within normal limits. Pro calcitonin level within normal limits. Afebrile. Nl WBC. Continue gentle hydration. Continue IV vancomycin and IV Zosyn. Consult with ID for further evaluation and rxcs. Follow.  #2 tachycardia/probable left bundle branch block Tachycardia likely secondary to sepsis and volume depletion as well as inability to take his beta blocker secondary to his emesis. Heart rate is improved with gentle hydration, IV antibiotics and IV beta blocker. Patient has been pancultured and these are pending. Cardiac enzymes are negative x3. Continue empiric IV vancomycin and IV Zosyn.  #3 urinary tract infection Urinalysis consistent with a UTI even with repeat UA after Foley catheter has been changed. Urine cultures are pending. Continue empiric IV Zosyn.  #4 hypokalemia Repleted.  #5 upper GI bleed/coffee-ground emesis Questionable etiology. No further coffee ground emesis. Patient with prior history of ulcerative esophagitis by EGD of March 2014. Hemoglobin currently 9.6 from 12.2 from 13.9 on admission. Patient currently on clears. Patient currently on a Protonix drip will change to IV PPI BID. Continue to hold aspirin and Plavix for now and will defer resumption to gastroenterology. Continue gentle hydration. Continue supportive care. GI has seen patient and patient to be reevaluated today to decide on endoscopy.  #6 Acute blood  loss anemia Likely secondary to UGIB/ coffee ground emesis. Hgb 9.6 from 13.9 on admission. Continue PPI. Continue serial CBC. Follow H/H. Transfusion for Hgb < 7.  #7 dementia When tolerating oral intake will resume home regimen.  #8 accelerated hypertension/malignant hypertension Improved. Continue IV Lopressor and hydralazine.  #9 gastroesophageal reflux disease PPI.  #10 well controlled diabetes mellitus Hemoglobin A1c was 5.9. CBG is 114-200. Continue sliding scale insulin.  #11 history of stroke with right-sided hemiparesis Aspirin and Plavix on hold secondary to coffee-ground emesis and will defer resumption to GI. PT/OT.  #12 cardiomyopathy Per 2-D echo February 2015. Continue IV beta blocker. Once tolerating oral intake will resume home dose Norvasc and Lipitor.  #13 history of BPH status post chronic Foley catheter Foley catheter has been changed. The repeat urinalysis consistent with UTI. Continue Flomax.  #14 depression Once tolerating an oral diet will resume home regimen.  #15 decubitus ulcer of the right buttocks Continue wound care. Frequent turning.  # 16 Dysphagia ST eval pending.  #17 prophylaxis PPI for GI prophylaxis. SCDs for DVT prophylaxis.  Code Status: Full Family Communication: No family at bedside. Disposition Plan: Transfer to step down.   Consultants:  GI: Dr Watt Climes 01/09/14  Procedures:  Chest x-ray 01/08/2014  Antibiotics:  IV vancomycin 01/08/2014  IV Zosyn 01/08/2014  HPI/Subjective: Patient alert, ff commands, dysarthria ( chronic). Per nursing no further coffee ground emesis overnight. Per nursing patient with problems swallowing.  Objective: Filed Vitals:   01/10/14 0800  BP: 159/84  Pulse: 97  Temp:   Resp: 22    Intake/Output Summary (Last 24 hours) at 01/10/14 0829 Last data filed at 01/10/14 0800  Gross per 24 hour  Intake   2710 ml  Output   1091 ml  Net  1619 ml   Filed Weights   01/09/14 0500  01/10/14 0400  Weight: 84 kg (185 lb 3 oz) 85.7 kg (188 lb 15 oz)    Exam:   General:  Awake, alert, dysarthria (chronic), ff commands.  Cardiovascular: Regular rate rhythm no murmurs rubs or gallops.  Respiratory: Clear to auscultation bilaterally in the anterior lung fields.  Abdomen: Soft, nontender, nondistended, positive bowel sounds.  Musculoskeletal: No clubbing cyanosis or edema. Right hemiparesis.   Data Reviewed: Basic Metabolic Panel:  Recent Labs Lab 01/08/14 1530 01/08/14 2120 01/09/14 0400 01/10/14 0540  NA 145  --  147 144  K 3.4*  --  3.6* 3.9  CL 106  --  112 111  CO2 23  --  23 24  GLUCOSE 158*  --  127* 139*  BUN 22  --  21 12  CREATININE 1.16  --  1.28 1.39*  CALCIUM 9.8  --  8.9 8.9  MG  --  1.8  --  2.0   Liver Function Tests:  Recent Labs Lab 01/08/14 1530 01/09/14 0400  AST 15 16  ALT 8 7  ALKPHOS 76 62  BILITOT 0.2* 0.3  PROT 7.0 5.9*  ALBUMIN 3.2* 2.8*   No results found for this basename: LIPASE, AMYLASE,  in the last 168 hours No results found for this basename: AMMONIA,  in the last 168 hours CBC:  Recent Labs Lab 01/08/14 1530 01/08/14 2120 01/09/14 0400 01/10/14 0540  WBC 11.1* 9.8 8.5 6.4  NEUTROABS 9.0*  --   --   --   HGB 13.9 13.0 12.2* 9.6*  HCT 42.9 40.1 39.1 31.0*  MCV 79.2 79.4 82.1 81.8  PLT 460* 370 339 229   Cardiac Enzymes:  Recent Labs Lab 01/08/14 1530 01/08/14 2120 01/09/14 0400 01/09/14 0959  TROPONINI <0.30 <0.30 <0.30 <0.30   BNP (last 3 results)  Recent Labs  10/20/13 1031  PROBNP 5795.0*   CBG:  Recent Labs Lab 01/08/14 2205 01/09/14 0814 01/09/14 1208 01/09/14 1542 01/09/14 2125  GLUCAP 99 126* 133* 114* 200*    Recent Results (from the past 240 hour(s))  URINE CULTURE     Status: None   Collection Time    01/08/14  2:50 PM      Result Value Ref Range Status   Specimen Description URINE, CATHETERIZED   Final   Special Requests NONE   Final   Culture  Setup Time      Final   Value: 01/08/2014 17:48     Performed at [place]     Final   Value: >=100,000 COLONIES/ML     Performed at [facility]   Culture     Final   Value: [place]     Performed at [facility]   Report Status PENDING   Incomplete  CULTURE, BLOOD (ROUTINE X 2)     Status: None   Collection Time    01/08/14  3:30 PM      Result Value Ref Range Status   Specimen Description BLOOD LEFT ANTECUBITAL   Final   Special Requests BOTTLES DRAWN AEROBIC AND ANAEROBIC 2CC   Final   Culture  Setup Time     Final   Value: 01/08/2014 21:32     Performed at [facility]   Culture     Final   Value: GRAM POSITIVE COCCI IN CLUSTERS     Note: Gram Stain Report Called to,Read Back By  and Verified With: Carlyn Reichert 01/09/14 @ 5PM BY [name]     Performed at [facility]   Report Status PENDING   Incomplete  MRSA PCR SCREENING     Status: None   Collection Time    01/08/14  8:10 PM      Result Value Ref Range Status   MRSA by PCR NEGATIVE  NEGATIVE Final   Comment:            The GeneXpert MRSA Assay (FDA     approved for NASAL specimens     only), is one component of a     comprehensive MRSA colonization     surveillance program. It is not     intended to diagnose MRSA     infection nor to guide or     monitor treatment for     MRSA infections.  URINE CULTURE     Status: None   Collection Time    01/08/14  9:38 PM      Result Value Ref Range Status   Specimen Description URINE, RANDOM   Final   Special Requests NONE   Final   Culture  Setup Time     Final   Value: 01/09/2014 03:53     Performed at [place] PENDING   Incomplete   Culture     Final   Value: Culture reincubated for better growth     Performed at [facility]   Report Status PENDING   Incomplete     Studies: Dg Chest Portable 1 View  01/08/2014   CLINICAL DATA:  Hematemesis, fever, central line placement  EXAM:  PORTABLE CHEST - 1 VIEW  COMPARISON:  DG CHEST 1V PORT dated 10/20/2013  FINDINGS: Interval placement of a right central venous line with tip in the distal SVC. Cardiac silhouette is enlarged. There are low lung volumes. No pneumothorax.  IMPRESSION: Right central venous line in good position.   Electronically Signed   By: Suzy Bouchard M.D.   On: 01/08/2014 16:09    Scheduled Meds: . ferrous sulfate  325 mg Oral BID WC  . hydrALAZINE  50 mg Oral TID  . insulin aspart  0-9 Units Subcutaneous TID WC  . metoCLOPramide (REGLAN) injection  5 mg Intravenous 4 times per day  . metoprolol  5 mg Intravenous 3 times per day  . piperacillin-tazobactam (ZOSYN)  IV  3.375 g Intravenous Q8H  . sodium chloride  10-40 mL Intracatheter Q12H  . sodium chloride  3 mL Intravenous Q12H  . tamsulosin  0.4 mg Oral Daily  . vancomycin  1,000 mg Intravenous Once  . vancomycin  750 mg Intravenous Q12H   Continuous Infusions: . dextrose 5 % and 0.45% NaCl 1,000 mL with potassium chloride 40 mEq infusion 75 mL/hr at 01/10/14 0800  . pantoprozole (PROTONIX) infusion 8 mg/hr (01/10/14 0800)    Principal Problem:   Sepsis Active Problems:   Bacteremia: 1/2 blood cultures   Acute blood loss anemia   Dementia   Stroke   GERD (gastroesophageal reflux disease)   UGIB (upper gastrointestinal bleed)   Hemiparesis affecting right side as late effect of stroke   Expressive aphasia   Hypertension, accelerated   BPH (benign prostatic hyperplasia)   Dyslipidemia   Depression   Anemia in chronic kidney disease(285.21)   Chronic kidney disease, stage III (moderate)   Type II or unspecified type diabetes mellitus with renal manifestations, uncontrolled   Tachycardia  Cardiomyopathy: Per 2 D echo 10/22/13 EF 30-35%   Coffee ground emesis   Hypokalemia   Decubitus ulcer of right buttock, stage 2   UTI (urinary tract infection)   Dysphagia, unspecified(787.20)    Time spent: 35 minutes    Eugenie Filler  M.D.  [facility] Pager [phone]. If 7PM-7AM, please contact night-coverage at [email or web address], password [facility] 01/10/2014, 8:29 AM  LOS: 2 days

## 2014-01-10 NOTE — Plan of Care (Signed)
Problem: Phase I Progression Outcomes Goal: Voiding-avoid urinary catheter unless indicated Outcome: Not Applicable Date Met:  39/58/44 Chronic foley at nursing home

## 2014-01-10 NOTE — Progress Notes (Signed)
OT Cancellation Note  Patient Details Name: [name] MRN: [id number] DOB: 1946-01-27   Cancelled Treatment:    Reason Eval/Treat Not Completed: OT screened, no needs identified, will sign off. Noted pt has lived at [facility] for 8-9 years and receives significant A with ADL activity. Will defer OT eval to SNF at this time.  Betsy Pries 01/10/2014, 12:02 PM

## 2014-01-10 NOTE — Progress Notes (Signed)
[facility] Progress Note  Subjective: The patient was seen today again in regards to a followup for coffee-ground emesis. He is doing well and has not had any further coffee-ground emesis. In looking back at his notes he has had ulcerative esophagitis in the past.  Objective: Vital signs in last 24 hours: Temp:  [98 F (36.7 C)-99 F (37.2 C)] 98.6 F (37 C) (04/27 1200) Pulse Rate:  [87-116] 87 (04/27 1400) Resp:  [14-29] 24 (04/27 1400) BP: (120-178)/(44-94) 176/83 mmHg (04/27 1400) SpO2:  [96 %-100 %] 100 % (04/27 1400) Weight:  [85.7 kg (188 lb 15 oz)] 85.7 kg (188 lb 15 oz) (04/27 0400) Weight change: 1.7 kg (3 lb 12 oz)   PE:  He is in no distress  Abdomen is soft and nontender  Lab Results: Results for orders placed during the hospital encounter of 01/08/14 (from the past 24 hour(s))  GLUCOSE, CAPILLARY     Status: Abnormal   Collection Time    01/09/14  3:42 PM      Result Value Ref Range   Glucose-Capillary 114 (*) 70 - 99 mg/dL  GLUCOSE, CAPILLARY     Status: Abnormal   Collection Time    01/09/14  9:25 PM      Result Value Ref Range   Glucose-Capillary 200 (*) 70 - 99 mg/dL  BASIC METABOLIC PANEL     Status: Abnormal   Collection Time    01/10/14  5:40 AM      Result Value Ref Range   Sodium 144  137 - 147 mEq/L   Potassium 3.9  3.7 - 5.3 mEq/L   Chloride 111  96 - 112 mEq/L   CO2 24  19 - 32 mEq/L   Glucose, Bld 139 (*) 70 - 99 mg/dL   BUN 12  6 - 23 mg/dL   Creatinine, Ser 1.39 (*) 0.50 - 1.35 mg/dL   Calcium 8.9  8.4 - 10.5 mg/dL   GFR calc non Af Amer 51 (*) >90 mL/min   GFR calc Af Amer 59 (*) >90 mL/min  MAGNESIUM     Status: None   Collection Time    01/10/14  5:40 AM      Result Value Ref Range   Magnesium 2.0  1.5 - 2.5 mg/dL  CBC     Status: Abnormal   Collection Time    01/10/14  5:40 AM      Result Value Ref Range   WBC 6.4  4.0 - 10.5 K/uL   RBC 3.79 (*) 4.22 - 5.81 MIL/uL   Hemoglobin 9.6 (*) 13.0 - 17.0 g/dL   HCT  31.0 (*) 39.0 - 52.0 %   MCV 81.8  78.0 - 100.0 fL   MCH 25.3 (*) 26.0 - 34.0 pg   MCHC 31.0  30.0 - 36.0 g/dL   RDW 16.3 (*) 11.5 - 15.5 %   Platelets 229  150 - 400 K/uL  GLUCOSE, CAPILLARY     Status: Abnormal   Collection Time    01/10/14  7:54 AM      Result Value Ref Range   Glucose-Capillary 147 (*) 70 - 99 mg/dL  GLUCOSE, CAPILLARY     Status: Abnormal   Collection Time    01/10/14 12:04 PM      Result Value Ref Range   Glucose-Capillary 150 (*) 70 - 99 mg/dL    Studies/Results: No results found.    Assessment: Coffee-ground emesis  Plan: Continue PPI therapy. We will proceed with EGD  tomorrow. I discussed this with him and his brother today.    Wonda Horner 01/10/2014, 2:56 PM  Lab Results  Component Value Date   HGB 9.6* 01/10/2014   HGB 12.2* 01/09/2014   HGB 13.0 01/08/2014   HCT 31.0* 01/10/2014   HCT 39.1 01/09/2014   HCT 40.1 01/08/2014   ALKPHOS 62 01/09/2014   ALKPHOS 76 01/08/2014   ALKPHOS 80 10/20/2013   AST 16 01/09/2014   AST 15 01/08/2014   AST 16 10/20/2013   ALT 7 01/09/2014   ALT 8 01/08/2014   ALT 11 10/20/2013

## 2014-01-11 ENCOUNTER — Encounter (HOSPITAL_COMMUNITY)
Admission: EM | Disposition: A | Discharge status: Skilled Nursing Facility | Payer: Self-pay | Source: Home / Self Care | Attending: Internal Medicine

## 2014-01-11 DIAGNOSIS — R69 Illness, unspecified: Secondary | ICD-10-CM

## 2014-01-11 LAB — URINE CULTURE: Colony Count: 50000

## 2014-01-11 LAB — CBC
HCT: 30.5 % — ABNORMAL LOW (ref 39.0–52.0)
HEMOGLOBIN: 9.6 g/dL — AB (ref 13.0–17.0)
MCH: 25.5 pg — AB (ref 26.0–34.0)
MCHC: 31.5 g/dL (ref 30.0–36.0)
MCV: 81.1 fL (ref 78.0–100.0)
Platelets: 238 10*3/uL (ref 150–400)
RBC: 3.76 MIL/uL — ABNORMAL LOW (ref 4.22–5.81)
RDW: 16.1 % — ABNORMAL HIGH (ref 11.5–15.5)
WBC: 5.9 10*3/uL (ref 4.0–10.5)

## 2014-01-11 LAB — BASIC METABOLIC PANEL
BUN: 8 mg/dL (ref 6–23)
CALCIUM: 8.5 mg/dL (ref 8.4–10.5)
CHLORIDE: 109 meq/L (ref 96–112)
CO2: 21 mEq/L (ref 19–32)
CREATININE: 1.4 mg/dL — AB (ref 0.50–1.35)
GFR calc Af Amer: 58 mL/min — ABNORMAL LOW (ref >90)
GFR calc non Af Amer: 50 mL/min — ABNORMAL LOW (ref >90)
GLUCOSE: 122 mg/dL — AB (ref 70–99)
Potassium: 4.1 mEq/L (ref 3.7–5.3)
Sodium: 142 mEq/L (ref 137–147)

## 2014-01-11 LAB — GLUCOSE, CAPILLARY
GLUCOSE-CAPILLARY: 119 mg/dL — AB (ref 70–99)
GLUCOSE-CAPILLARY: 139 mg/dL — AB (ref 70–99)
GLUCOSE-CAPILLARY: 133 mg/dL — AB (ref 70–99)
GLUCOSE-CAPILLARY: 180 mg/dL — AB (ref 70–99)
GLUCOSE-CAPILLARY: 115 mg/dL — AB (ref 70–99)

## 2014-01-11 LAB — CULTURE, BLOOD (ROUTINE X 2)

## 2014-01-11 LAB — VANCOMYCIN, TROUGH: VANCOMYCIN TR: 18.3 ug/mL (ref 10.0–20.0)

## 2014-01-11 SURGERY — EGD (ESOPHAGOGASTRODUODENOSCOPY)
Anesthesia: Moderate Sedation

## 2014-01-11 MED ORDER — METOPROLOL TARTRATE 1 MG/ML IV SOLN: 10.0000 mg | Freq: Three times a day (TID) | INTRAVENOUS | Status: DC

## 2014-01-11 MED ORDER — ENALAPRILAT 1.25 MG/ML IV SOLN
0.6250 mg | Freq: Four times a day (QID) | INTRAVENOUS | Status: DC
  Administered 2014-01-11: 0.625 mg via INTRAVENOUS
  Filled 2014-01-11 (×3): qty 0.5

## 2014-01-11 MED ORDER — SODIUM CHLORIDE 0.9 % IV SOLN
INTRAVENOUS | Status: DC
  Administered 2014-01-12: 11:00:00 via INTRAVENOUS

## 2014-01-11 MED ORDER — CEFEPIME HCL 1 G IJ SOLR
1.0000 g | Freq: Three times a day (TID) | INTRAMUSCULAR | Status: DC
  Administered 2014-01-11 – 2014-01-12 (×3): 1 g via INTRAVENOUS
  Filled 2014-01-11 (×4): qty 1

## 2014-01-11 MED ORDER — LABETALOL HCL 5 MG/ML IV SOLN
10.0000 mg | Freq: Once | INTRAVENOUS | Status: AC
  Administered 2014-01-11: 10 mg via INTRAVENOUS
  Filled 2014-01-11: qty 4

## 2014-01-11 MED ORDER — ENALAPRILAT 1.25 MG/ML IV SOLN
0.6250 mg | Freq: Four times a day (QID) | INTRAVENOUS | Status: DC
  Administered 2014-01-12 (×3): 0.625 mg via INTRAVENOUS
  Filled 2014-01-11 (×6): qty 0.5

## 2014-01-11 MED ORDER — MORPHINE SULFATE 2 MG/ML IJ SOLN
1.0000 mg | INTRAMUSCULAR | Status: DC | PRN
  Administered 2014-01-12: 2 mg via INTRAVENOUS
  Filled 2014-01-11 (×2): qty 1

## 2014-01-11 MED ORDER — METOPROLOL TARTRATE 1 MG/ML IV SOLN
7.5000 mg | Freq: Three times a day (TID) | INTRAVENOUS | Status: DC
  Administered 2014-01-11: 7.5 mg via INTRAVENOUS

## 2014-01-11 MED ORDER — ENALAPRILAT 1.25 MG/ML IV SOLN
1.2500 mg | Freq: Four times a day (QID) | INTRAVENOUS | Status: DC
  Filled 2014-01-11 (×2): qty 1

## 2014-01-11 MED ORDER — ENALAPRILAT 1.25 MG/ML IV SOLN
0.6250 mg | Freq: Once | INTRAVENOUS | Status: DC
  Administered 2014-01-11: 0.625 mg via INTRAVENOUS
  Filled 2014-01-11: qty 0.5

## 2014-01-11 MED ORDER — METOPROLOL TARTRATE 1 MG/ML IV SOLN
10.0000 mg | Freq: Four times a day (QID) | INTRAVENOUS | Status: DC
  Administered 2014-01-11 – 2014-01-15 (×13): 10 mg via INTRAVENOUS
  Filled 2014-01-11 (×21): qty 10

## 2014-01-11 MED ORDER — KETOROLAC TROMETHAMINE 30 MG/ML IJ SOLN: 30.0000 mg | Freq: Four times a day (QID) | INTRAMUSCULAR | Status: DC | PRN

## 2014-01-11 NOTE — Progress Notes (Signed)
ANTIBIOTIC CONSULT NOTE - INITIAL  Pharmacy Consult for Cefepime Indication: UTI  No Known Allergies  Patient Measurements: Height: 6' 0.05" (183 cm) Weight: 188 lb 15 oz (85.7 kg) IBW/kg (Calculated) : 77.71   Vital Signs: Temp: 98.2 F (36.8 C) (04/28 1200) Temp src: Oral (04/28 1200) BP: 171/74 mmHg (04/28 1600) Pulse Rate: 116 (04/28 1600) Intake/Output from previous day: 04/27 0701 - 04/28 0700 In: 2345.6 [P.O.:3.5; I.V.:1892.1; IV Piggyback:450] Out: 1315 [Urine:1315] Intake/Output from this shift: Total I/O In: 725 [I.V.:675; IV Piggyback:50] Out: 4098 [Urine:1065]  Labs:  Recent Labs  01/09/14 0400 01/10/14 0540 01/11/14 0540  WBC 8.5 6.4 5.9  HGB 12.2* 9.6* 9.6*  PLT 339 229 238  CREATININE 1.28 1.39* 1.40*   Estimated Creatinine Clearance: 55.5 ml/min (by C-G formula based on Cr of 1.4).  Recent Labs  01/11/14 1200  [place] 18.3     Microbiology: Recent Results (from the past 720 hour(s))  URINE CULTURE     Status: None   Collection Time    01/08/14  2:50 PM      Result Value Ref Range Status   Specimen Description URINE, CATHETERIZED   Final   Special Requests NONE   Final   Culture  Setup Time     Final   Value: 01/08/2014 17:48     Performed at [facility]     Final   Value: >=100,000 COLONIES/ML     Performed at [facility]   Culture     Final   Value: [place]     Performed at [facility]   Report Status PENDING   Incomplete  CULTURE, BLOOD (ROUTINE X 2)     Status: None   Collection Time    01/08/14  3:30 PM      Result Value Ref Range Status   Specimen Description BLOOD LEFT ANTECUBITAL   Final   Special Requests BOTTLES DRAWN AEROBIC AND ANAEROBIC 2CC   Final   Culture  Setup Time     Final   Value: 01/08/2014 21:32     Performed at [facility]   Culture     Final   Value: STAPHYLOCOCCUS SPECIES (COAGULASE NEGATIVE)     Note: THE SIGNIFICANCE OF ISOLATING  THIS ORGANISM FROM A SINGLE VENIPUNCTURE CANNOT BE PREDICTED WITHOUT FURTHER CLINICAL AND CULTURE CORRELATION. SUSCEPTIBILITIES AVAILABLE ONLY ON REQUEST.     Note: Gram Stain Report Called to,Read Back By and Verified With: Carlyn Reichert 01/09/14 @ 5PM BY [name]     Performed at [facility]   Report Status 01/11/2014 FINAL   Final  MRSA PCR SCREENING     Status: None   Collection Time    01/08/14  8:10 PM      Result Value Ref Range Status   MRSA by PCR NEGATIVE  NEGATIVE Final   Comment:            The GeneXpert MRSA Assay (FDA     approved for NASAL specimens     only), is one component of a     comprehensive MRSA colonization     surveillance program. It is not     intended to diagnose MRSA     infection nor to guide or     monitor treatment for     MRSA infections.  URINE CULTURE     Status: None   Collection Time    01/08/14  9:38 PM  Result Value Ref Range Status   Specimen Description URINE, RANDOM   Final   Special Requests NONE   Final   Culture  Setup Time     Final   Value: 01/09/2014 03:53     Performed at [facility]     Final   Value: 50,000 COLONIES/ML     Performed at [facility]   Culture     Final   Value: Multiple bacterial morphotypes present, none predominant. Suggest appropriate recollection if clinically indicated.     Performed at [facility]   Report Status 01/11/2014 FINAL   Final    Medical History: Past Medical History  Diagnosis Date  . Dementia   . Stroke   . Hydronephrosis   . Hypertension   . Hyperlipemia   . GERD (gastroesophageal reflux disease)   . BPH (benign prostatic hyperplasia)   . Cardiomyopathy: Per 2 D echo 10/22/13 EF 30-35% 01/08/2014    Medications:  Scheduled:  . ceFEPime (MAXIPIME) IV  1 g Intravenous 3 times per day  . enalaprilat  0.625 mg Intravenous 4 times per day  . ferrous sulfate  325 mg Oral BID WC  . hydrALAZINE  50 mg Oral TID  . insulin aspart  0-9  Units Subcutaneous TID WC  . metoCLOPramide (REGLAN) injection  5 mg Intravenous 4 times per day  . metoprolol  10 mg Intravenous Q6H  . pantoprazole (PROTONIX) IV  40 mg Intravenous Q12H  . sodium chloride  10-40 mL Intracatheter Q12H  . sodium chloride  3 mL Intravenous Q12H  . tamsulosin  0.4 mg Oral Daily   Infusions:  . sodium chloride    . dextrose 5 % and 0.45% NaCl 1,000 mL with potassium chloride 40 mEq infusion 75 mL/hr at 01/10/14 1320   PRN: acetaminophen, acetaminophen, hydrALAZINE, ipratropium, levalbuterol, ondansetron (ZOFRAN) IV, ondansetron, sodium chloride Assessment: 68 yo M received 4 days of vancomycin and zosyn for sepsis, acute illness is suspected secondary to UTI.  ID narrowing antibiotics to cefepime.   Coag negative staph in blood cultures thought to be a contaminant   Scr 1.4 with estimated CrCl 55 ml/min  WBC 5.9   AF  4/25 >> vancomycin >> 4/28 4/25 >> Zosyn >> 4/28 4/28 >> Cefepime >>   4/25 blood x2: Coag neg staph F 4/25 urine: > 100K GNR - Preliminary result show klebsiella and proteus  4/25 urine: 50K colonies F  Goal of Therapy:  Cefepime per renal function   Plan:  1.) Start Cefepime 1 gram IV q8h 2.) f/u renal function 3.) f/u urinary cultures and f/u sensitives   [place] PharmD Pager #: [phone] 5:42 PM 01/11/2014

## 2014-01-11 NOTE — Progress Notes (Signed)
[id number] Rosana Hoes, RN, BSN, CCM, [id number] Chart reviewed for update of needs and condition./

## 2014-01-11 NOTE — Progress Notes (Signed)
PT Cancellation Note  ___Treatment cancelled today due to medical issues with patient which prohibited therapy  _X_ Treatment cancelled today due to patient receiving procedure or test ........ EGD scheduled today  ___ Treatment cancelled today due to patient's refusal to participate   ___ Treatment cancelled today due to  Melda Mermelstein  PTA [facility]  Acute  Rehab Pager      [phone]  

## 2014-01-11 NOTE — Progress Notes (Signed)
[facility] PROGRESS NOTE  [name] GHW:[id number] DOB: Feb 02, 1946 DOA: 01/08/2014 PCP: No primary provider on file.  Assessment/Plan: #1 sepsis/ prob bacteremia Likely secondary to urinary tract infection and bacteremia. Patient with chronic indwelling Foley catheter urinalysis obtained in the ED was consistent with UTI. Foley catheter was changed a repeat urinalysis consistent with UTI. Patient has been pancultured and these are pending. Blood culture with coag neg staph. Chest x-ray negative for acute infiltrate. Lactic acid level within normal limits. Pro calcitonin level within normal limits. Afebrile. Nl WBC. Continue gentle hydration. Continue IV Zosyn. D/C IV Vancomycin. ID ff and appreciate input and rxcs.  Follow.  #2 tachycardia/probable left bundle branch block Tachycardia likely secondary to sepsis and volume depletion as well as inability to take his beta blocker secondary to his emesis. Heart rate is improved with gentle hydration, IV antibiotics and IV beta blocker. Patient has been pancultured and these are pending. Cardiac enzymes are negative x3. Continue empiric IV vancomycin and IV Zosyn.  #3 urinary tract infection Urinalysis consistent with a UTI even with repeat UA after Foley catheter has been changed. Urine cultures with > 100,000 GNR. Continue empiric IV Zosyn.  #4 hypokalemia Repleted.  #5 upper GI bleed/coffee-ground emesis Questionable etiology. No further coffee ground emesis. Patient with prior history of ulcerative esophagitis by EGD of March 2014. Hemoglobin currently 9.6 from 12.2 from 13.9 on admission. Patient currently on clears. Patient currently on  IV PPI BID. Continue to hold aspirin and Plavix for now and will defer resumption to gastroenterology. Continue gentle hydration. Continue supportive care. GI has seen patient and patient for upper endoscopy today.  #6 Acute blood loss anemia Likely secondary to UGIB/ coffee ground emesis. Hgb  9.6 from 13.9 on admission. Continue PPI. Continue serial CBC. Follow H/H. Transfusion for Hgb < 7.  #7 dementia When tolerating oral intake will resume home regimen.  #8 accelerated hypertension/malignant hypertension Improved. Increase IV Lopressor and continue prn and hydralazine.  #9 gastroesophageal reflux disease PPI.  #10 well controlled diabetes mellitus Hemoglobin A1c was 5.9. CBG is 91-150. Continue sliding scale insulin.  #11 history of stroke with right-sided hemiparesis Aspirin and Plavix on hold secondary to coffee-ground emesis and will defer resumption to GI. PT/OT.  #12 cardiomyopathy Per 2-D echo February 2015. Continue IV beta blocker. Once tolerating oral intake will resume home dose Norvasc and Lipitor.  #13 history of BPH status post chronic Foley catheter Foley catheter has been changed. The repeat urinalysis consistent with UTI. Continue Flomax.  #14 depression Once tolerating an oral diet will resume home regimen.  #15 decubitus ulcer of the right buttocks Continue wound care. Frequent turning.  # 16 Dysphagia Patient has been evaluated by speech therapy and recommend clears and when patient tolerating a diet dysphagia 3 diet.  #17 prophylaxis PPI for GI prophylaxis. SCDs for DVT prophylaxis.  Code Status: Full Family Communication: No family at bedside. Disposition Plan: Remain in step down.   Consultants:  GI: Dr Watt Climes 01/09/14  ID:Dr Megan Salon 01/10/14  Procedures:  Chest x-ray 01/08/2014  Antibiotics:  IV vancomycin 01/08/2014>>>>01/10/14  IV Zosyn 01/08/2014  HPI/Subjective: Patient alert, ff commands, dysarthria ( chronic). Per nursing no further coffee ground emesis overnight.  Objective: Filed Vitals:   01/11/14 0800  BP: 182/91  Pulse: 105  Temp:   Resp: 13    Intake/Output Summary (Last 24 hours) at 01/11/14 0816 Last data filed at 01/11/14 0800  Gross per 24 hour  Intake 2295.58 ml  Output  1345 ml  Net 950.58  ml   Filed Weights   01/09/14 0500 01/10/14 0400  Weight: 84 kg (185 lb 3 oz) 85.7 kg (188 lb 15 oz)    Exam:   General:  Awake, alert, dysarthria (chronic), ff commands.  Cardiovascular: Regular rate rhythm no murmurs rubs or gallops.  Respiratory: Clear to auscultation bilaterally in the anterior lung fields.  Abdomen: Soft, nontender, nondistended, positive bowel sounds.  Musculoskeletal: No clubbing cyanosis or edema. Right hemiparesis.   Data Reviewed: Basic Metabolic Panel:  Recent Labs Lab 01/08/14 1530 01/08/14 2120 01/09/14 0400 01/10/14 0540 01/11/14 0540  NA 145  --  147 144 142  K 3.4*  --  3.6* 3.9 4.1  CL 106  --  112 111 109  CO2 23  --  23 24 21   GLUCOSE 158*  --  127* 139* 122*  BUN 22  --  21 12 8   CREATININE 1.16  --  1.28 1.39* 1.40*  CALCIUM 9.8  --  8.9 8.9 8.5  MG  --  1.8  --  2.0  --    Liver Function Tests:  Recent Labs Lab 01/08/14 1530 01/09/14 0400  AST 15 16  ALT 8 7  ALKPHOS 76 62  BILITOT 0.2* 0.3  PROT 7.0 5.9*  ALBUMIN 3.2* 2.8*   No results found for this basename: LIPASE, AMYLASE,  in the last 168 hours No results found for this basename: AMMONIA,  in the last 168 hours CBC:  Recent Labs Lab 01/08/14 1530 01/08/14 2120 01/09/14 0400 01/10/14 0540 01/11/14 0540  WBC 11.1* 9.8 8.5 6.4 5.9  NEUTROABS 9.0*  --   --   --   --   HGB 13.9 13.0 12.2* 9.6* 9.6*  HCT 42.9 40.1 39.1 31.0* 30.5*  MCV 79.2 79.4 82.1 81.8 81.1  PLT 460* 370 339 229 238   Cardiac Enzymes:  Recent Labs Lab 01/08/14 1530 01/08/14 2120 01/09/14 0400 01/09/14 0959  TROPONINI <0.30 <0.30 <0.30 <0.30   BNP (last 3 results)  Recent Labs  10/20/13 1031  PROBNP 5795.0*   CBG:  Recent Labs Lab 01/09/14 2125 01/10/14 0754 01/10/14 1204 01/10/14 1534 01/10/14 2107  GLUCAP 200* 147* 150* 91 119*    Recent Results (from the past 240 hour(s))  URINE CULTURE     Status: None   Collection Time    01/08/14  2:50 PM      Result  Value Ref Range Status   Specimen Description URINE, CATHETERIZED   Final   Special Requests NONE   Final   Culture  Setup Time     Final   Value: 01/08/2014 17:48     Performed at [facility]     Final   Value: >=100,000 COLONIES/ML     Performed at [facility]   Culture     Final   Value: [place]     Performed at [facility]   Report Status PENDING   Incomplete  CULTURE, BLOOD (ROUTINE X 2)     Status: None   Collection Time    01/08/14  3:30 PM      Result Value Ref Range Status   Specimen Description BLOOD LEFT ANTECUBITAL   Final   Special Requests BOTTLES DRAWN AEROBIC AND ANAEROBIC 2CC   Final   Culture  Setup Time     Final   Value: 01/08/2014 21:32     Performed at [facility]  Culture     Final   Value: GRAM POSITIVE COCCI IN CLUSTERS     Note: Gram Stain Report Called to,Read Back By and Verified With: Carlyn Reichert 01/09/14 @ 5PM BY [name]     Performed at [facility]   Report Status PENDING   Incomplete  MRSA PCR SCREENING     Status: None   Collection Time    01/08/14  8:10 PM      Result Value Ref Range Status   MRSA by PCR NEGATIVE  NEGATIVE Final   Comment:            The GeneXpert MRSA Assay (FDA     approved for NASAL specimens     only), is one component of a     comprehensive MRSA colonization     surveillance program. It is not     intended to diagnose MRSA     infection nor to guide or     monitor treatment for     MRSA infections.  URINE CULTURE     Status: None   Collection Time    01/08/14  9:38 PM      Result Value Ref Range Status   Specimen Description URINE, RANDOM   Final   Special Requests NONE   Final   Culture  Setup Time     Final   Value: 01/09/2014 03:53     Performed at [place] PENDING   Incomplete   Culture     Final   Value: Culture reincubated for better growth     Performed at [facility]   Report Status PENDING    Incomplete     Studies: No results found.  Scheduled Meds: . ferrous sulfate  325 mg Oral BID WC  . hydrALAZINE  50 mg Oral TID  . insulin aspart  0-9 Units Subcutaneous TID WC  . metoCLOPramide (REGLAN) injection  5 mg Intravenous 4 times per day  . metoprolol  5 mg Intravenous 3 times per day  . pantoprazole (PROTONIX) IV  40 mg Intravenous Q12H  . piperacillin-tazobactam (ZOSYN)  IV  3.375 g Intravenous Q8H  . sodium chloride  10-40 mL Intracatheter Q12H  . sodium chloride  3 mL Intravenous Q12H  . tamsulosin  0.4 mg Oral Daily  . vancomycin  1,000 mg Intravenous Once  . vancomycin  750 mg Intravenous Q12H   Continuous Infusions: . dextrose 5 % and 0.45% NaCl 1,000 mL with potassium chloride 40 mEq infusion 75 mL/hr at 01/10/14 1320    Principal Problem:   Sepsis Active Problems:   Bacteremia   Acute blood loss anemia   Dementia   Stroke   GERD (gastroesophageal reflux disease)   UGIB (upper gastrointestinal bleed)   Hemiparesis affecting right side as late effect of stroke   Expressive aphasia   Hypertension, accelerated   BPH (benign prostatic hyperplasia)   Dyslipidemia   Depression   Anemia in chronic kidney disease(285.21)   Chronic kidney disease, stage III (moderate)   Type II or unspecified type diabetes mellitus with renal manifestations, uncontrolled   Tachycardia   Cardiomyopathy: Per 2 D echo 10/22/13 EF 30-35%   Coffee ground emesis   Hypokalemia   Decubitus ulcer of right buttock, stage 2   UTI (urinary tract infection)   Dysphagia, unspecified(787.20)    Time spent: 40 minutes    Eugenie Filler M.D.  [facility] Pager [phone]. If 7PM-7AM, please contact night-coverage  at [email or web address], password [facility] 01/11/2014, 8:16 AM  LOS: 3 days

## 2014-01-11 NOTE — Progress Notes (Signed)
Patient ID: [name], male   DOB: Nov 02, 1945, 68 y.o.   MRN: [id number]         [facility]    Date of Admission:  01/08/2014           Day 4 vancomycin        Day 4 piperacillin tazobactam  Principal Problem:   Sepsis Active Problems:   UTI (urinary tract infection)   Bacteremia   Dementia   Stroke   GERD (gastroesophageal reflux disease)   UGIB (upper gastrointestinal bleed)   Hemiparesis affecting right side as late effect of stroke   Expressive aphasia   Hypertension, accelerated   BPH (benign prostatic hyperplasia)   Dyslipidemia   Depression   Anemia in chronic kidney disease(285.21)   Chronic kidney disease, stage III (moderate)   Type II or unspecified type diabetes mellitus with renal manifestations, uncontrolled   Tachycardia   Cardiomyopathy: Per 2 D echo 10/22/13 EF 30-35%   Coffee ground emesis   Hypokalemia   Decubitus ulcer of right buttock, stage 2   Acute blood loss anemia   Dysphagia, unspecified(787.20)   . ferrous sulfate  325 mg Oral BID WC  . hydrALAZINE  50 mg Oral TID  . insulin aspart  0-9 Units Subcutaneous TID WC  . metoCLOPramide (REGLAN) injection  5 mg Intravenous 4 times per day  . metoprolol  7.5 mg Intravenous 3 times per day  . pantoprazole (PROTONIX) IV  40 mg Intravenous Q12H  . piperacillin-tazobactam (ZOSYN)  IV  3.375 g Intravenous Q8H  . sodium chloride  10-40 mL Intracatheter Q12H  . sodium chloride  3 mL Intravenous Q12H  . tamsulosin  0.4 mg Oral Daily  . vancomycin  1,000 mg Intravenous Once  . vancomycin  750 mg Intravenous Q12H    Objective: Temp:  [98.2 F (36.8 C)-99.7 F (37.6 C)] 98.2 F (36.8 C) (04/28 1200) Pulse Rate:  [91-126] 116 (04/28 1600) Resp:  [13-29] 29 (04/28 1600) BP: (127-200)/(61-141) 171/74 mmHg (04/28 1600) SpO2:  [96 %-100 %] 98 % (04/28 1600)  General: His records it is been more alert and interactive today. He was able to order his own lunch. He is  currently sleeping   Lab Results Lab Results  Component Value Date   WBC 5.9 01/11/2014   HGB 9.6* 01/11/2014   HCT 30.5* 01/11/2014   MCV 81.1 01/11/2014   PLT 238 01/11/2014    Lab Results  Component Value Date   CREATININE 1.40* 01/11/2014   BUN 8 01/11/2014   NA 142 01/11/2014   K 4.1 01/11/2014   CL 109 01/11/2014   CO2 21 01/11/2014    Lab Results  Component Value Date   ALT 7 01/09/2014   AST 16 01/09/2014   ALKPHOS 62 01/09/2014   BILITOT 0.3 01/09/2014      Microbiology: Recent Results (from the past 240 hour(s))  URINE CULTURE     Status: None   Collection Time    01/08/14  2:50 PM      Result Value Ref Range Status   Specimen Description URINE, CATHETERIZED   Final   Special Requests NONE   Final   Culture  Setup Time     Final   Value: 01/08/2014 17:48     Performed at [place]     Final   Value: >=100,000 COLONIES/ML     Performed at [facility]  Final   Value: GRAM NEGATIVE RODS     Performed at [facility]   Report Status PENDING   Incomplete  CULTURE, BLOOD (ROUTINE X 2)     Status: None   Collection Time    01/08/14  3:30 PM      Result Value Ref Range Status   Specimen Description BLOOD LEFT ANTECUBITAL   Final   Special Requests BOTTLES DRAWN AEROBIC AND ANAEROBIC 2CC   Final   Culture  Setup Time     Final   Value: 01/08/2014 21:32     Performed at [facility]   Culture     Final   Value: STAPHYLOCOCCUS SPECIES (COAGULASE NEGATIVE)     Note: THE SIGNIFICANCE OF ISOLATING THIS ORGANISM FROM A SINGLE VENIPUNCTURE CANNOT BE PREDICTED WITHOUT FURTHER CLINICAL AND CULTURE CORRELATION. SUSCEPTIBILITIES AVAILABLE ONLY ON REQUEST.     Note: Gram Stain Report Called to,Read Back By and Verified With: Carlyn Reichert 01/09/14 @ 5PM BY [name]     Performed at [facility]   Report Status 01/11/2014 FINAL   Final  MRSA PCR SCREENING     Status: None   Collection Time    01/08/14  8:10 PM       Result Value Ref Range Status   MRSA by PCR NEGATIVE  NEGATIVE Final   Comment:            The GeneXpert MRSA Assay (FDA     approved for NASAL specimens     only), is one component of a     comprehensive MRSA colonization     surveillance program. It is not     intended to diagnose MRSA     infection nor to guide or     monitor treatment for     MRSA infections.  URINE CULTURE     Status: None   Collection Time    01/08/14  9:38 PM      Result Value Ref Range Status   Specimen Description URINE, RANDOM   Final   Special Requests NONE   Final   Culture  Setup Time     Final   Value: 01/09/2014 03:53     Performed at [place]     Final   Value: 50,000 COLONIES/ML     Performed at [facility]   Culture     Final   Value: Multiple bacterial morphotypes present, none predominant. Suggest appropriate recollection if clinically indicated.     Performed at [facility]   Report Status 01/11/2014 FINAL   Final    Studies/Results: No results found.  Assessment: I think his acute illness is probably due to a symptomatic urinary tract infection. The one positive blood culture growing coagulase-negative staph is probably an insignificant contaminant. His pulmonary urine culture results have grown to organisms, Klebsiella and Proteus. His Klebsiella is susceptible to cefepime. No susceptibility results are available for his Proteus isolate yet. I will narrow his antibiotic therapy to cefepime alone for now.  Plan: 1. Change antibiotic therapy to IV cefepime pending final urine culture results  Michel Bickers, MD [facility] for [place] [phone] pager   [phone] cell 01/11/2014, 5:12 PM

## 2014-01-12 DIAGNOSIS — N183 Chronic kidney disease, stage 3 unspecified: Secondary | ICD-10-CM

## 2014-01-12 DIAGNOSIS — N10 Acute tubulo-interstitial nephritis: Secondary | ICD-10-CM

## 2014-01-12 DIAGNOSIS — N179 Acute kidney failure, unspecified: Secondary | ICD-10-CM

## 2014-01-12 DIAGNOSIS — E1129 Type 2 diabetes mellitus with other diabetic kidney complication: Secondary | ICD-10-CM

## 2014-01-12 DIAGNOSIS — E1165 Type 2 diabetes mellitus with hyperglycemia: Secondary | ICD-10-CM

## 2014-01-12 DIAGNOSIS — F0390 Unspecified dementia without behavioral disturbance: Secondary | ICD-10-CM

## 2014-01-12 DIAGNOSIS — N189 Chronic kidney disease, unspecified: Secondary | ICD-10-CM

## 2014-01-12 LAB — CBC
HCT: 31.4 % — ABNORMAL LOW (ref 39.0–52.0)
HEMOGLOBIN: 9.6 g/dL — AB (ref 13.0–17.0)
MCH: 25 pg — AB (ref 26.0–34.0)
MCHC: 30.6 g/dL (ref 30.0–36.0)
MCV: 81.8 fL (ref 78.0–100.0)
PLATELETS: 255 10*3/uL (ref 150–400)
RBC: 3.84 MIL/uL — AB (ref 4.22–5.81)
RDW: 16.1 % — ABNORMAL HIGH (ref 11.5–15.5)
WBC: 6 10*3/uL (ref 4.0–10.5)

## 2014-01-12 LAB — BASIC METABOLIC PANEL
BUN: 6 mg/dL (ref 6–23)
CALCIUM: 8.9 mg/dL (ref 8.4–10.5)
CO2: 23 mEq/L (ref 19–32)
Chloride: 110 mEq/L (ref 96–112)
Creatinine, Ser: 1.37 mg/dL — ABNORMAL HIGH (ref 0.50–1.35)
GFR calc Af Amer: 60 mL/min — ABNORMAL LOW (ref >90)
GFR, EST NON AFRICAN AMERICAN: 51 mL/min — AB (ref >90)
Glucose, Bld: 125 mg/dL — ABNORMAL HIGH (ref 70–99)
POTASSIUM: 4.4 meq/L (ref 3.7–5.3)
Sodium: 142 mEq/L (ref 137–147)

## 2014-01-12 LAB — GLUCOSE, CAPILLARY
GLUCOSE-CAPILLARY: 96 mg/dL (ref 70–99)
Glucose-Capillary: 122 mg/dL — ABNORMAL HIGH (ref 70–99)
Glucose-Capillary: 123 mg/dL — ABNORMAL HIGH (ref 70–99)
Glucose-Capillary: 121 mg/dL — ABNORMAL HIGH (ref 70–99)

## 2014-01-12 LAB — URINE CULTURE: Colony Count: >=100000

## 2014-01-12 MED ORDER — ENSURE COMPLETE PO LIQD
237.0000 mL | Freq: Two times a day (BID) | ORAL | Status: DC
  Administered 2014-01-12 – 2014-01-20 (×11): 237 mL via ORAL

## 2014-01-12 MED ORDER — LEVOFLOXACIN 500 MG PO TABS
500.0000 mg | ORAL_TABLET | Freq: Every day | ORAL | Status: DC
  Administered 2014-01-12 – 2014-01-16 (×4): 500 mg via ORAL
  Filled 2014-01-12 (×5): qty 1

## 2014-01-12 MED ORDER — SODIUM CHLORIDE 0.9 % IV SOLN: INTRAVENOUS | Status: DC

## 2014-01-12 NOTE — Progress Notes (Signed)
Physical Therapy Treatment Patient Details Name: [name] MRN: [id number] DOB: Mar 11, 1946 Today's Date: 01/12/2014    History of Present Illness 68 y.o. male with history of stroke with right-sided hemiparesis on Plavix and aspirin, dementia, BPH with chronic indwelling Foley catheter, hypertension, chronic kidney disease, gastroesophageal reflux disease, history of severe ulcerative esophagitis  admitted with coffee ground emesis. Dx of sepsis, UTI, upper GI bleed.     PT Comments    Pt in bed on 1.5 lts O2 at 100% so removed nasal cannula.  BP 170/98(121), HR 104, RR 23.  Pt alert and following commands.  Slurred speech and some expressive aphagia (word finding) believe to be his baseline.  Assisted from supine to EOB.  Sat EOB x 5 min at [employer] [facility].  Assisted to recliner total assist + 2 1/4 pivot from elevated bed to recliner pt 10%.    Follow Up Recommendations  SNF;Supervision/Assistance - 24 hour     Equipment Recommendations  Wheelchair (measurements PT);Wheelchair cushion (measurements PT)    Recommendations for Other Services       Precautions / Restrictions Precautions Precautions: Fall Precaution Comments: R hemiparesis Restrictions Weight Bearing Restrictions: No    Mobility  Bed Mobility Overal bed mobility: Needs Assistance Bed Mobility: Supine to Sit     Supine to sit: Max assist;+2 for physical assistance     General bed mobility comments: pt 40% for supine to sit, assist to advance RLE and to raise trunk.  Required 100% assist to scoot to eob.  Poor sitting balance with R lean on R UE for support and forward flex.    Transfers Overall transfer level: Needs assistance   Transfers: Stand Pivot Transfers   Stand pivot transfers: +2 safety/equipment;+2 physical assistance;Total assist (pt 10%)       General transfer comment: 1/4 pivot turn from elevated bed to recliner + 2 total assist pt only 10%.  Sky lift pad in recliner for nursing to  use to transfer pt back to bed.  Ambulation/Gait                 Stairs            Wheelchair Mobility    Modified Rankin (Stroke Patients Only)       Balance                                    Cognition                            Exercises      General Comments        Pertinent Vitals/Pain     Home Living                      Prior Function            PT Goals (current goals can now be found in the care plan section) Progress towards PT goals: Progressing toward goals    Frequency  Min 3X/week    PT Plan      Co-evaluation             End of Session Equipment Utilized During Treatment: Gait belt Activity Tolerance: Patient tolerated treatment well Patient left: in chair;with call bell/phone within reach     Time: 1005-1030 PT Time Calculation (min): 25 min  Charges:  $Therapeutic  Activity: 23-37 mins                    G Codes:      Rica Koyanagi  PTA [facility]  Acute  Rehab Pager      [phone]

## 2014-01-12 NOTE — Progress Notes (Signed)
Patient ID: [name], male   DOB: 1946-07-13, 68 y.o.   MRN: [id number]  [facility] PROGRESS NOTE  [name]:[id number] DOB: 11-01-1945 DOA: 01/08/2014 PCP: No primary provider on file.  Brief narrative: 68 y.o. male, resident of SNF, with history of stroke with residual  right-sided hemiparesis (on Plavix and aspirin), dementia, BPH with chronic indwelling Foley catheter, HTN, CKD, GERD, severe ulcerative esophagitis by EGD March 2014 with biopsy showing nonspecific inflammation, recently hospitalized in February of 2015 for hematemesis, now presented to the ED with main concern of coffee-ground emesis.  Upon arrival to ED, pt's SBP was in 200's with HR in 130 - 140's. UA suggestive of UTI. Pt started on Protonix drip, IV Vancomycin and Zosyn.TRH asked to admit, GI consulted.    Principal Problem:   Sepsis - most likely secondary to UTI - urine culture 4/25 grew klebsiella and proteus  Both sensitive to Ciprofloxacin, Levaquin, Rocephin - if ID specialist agrees, we could probably narrow down ABX coverage to one of the above ABX - will follow up on recommendations, appreciate input  Active Problems:   Coffee ground emesis - plan for EGD in AM, appreciate assistance  - Hg/Hct remain stable and at baseline - continue Protonix IV BID    HTN, accelerated - continue Hydralazine, Metoprolol - stop Enalaprilat due to renal failure - we can add another antihypertensive regimen if indicated   Hemiparesis affecting right side as late effect of stroke  - will place order for PT evaluation once pt more medically able to participate    BPH (benign prostatic hyperplasia) - continue Tamsulosin    Anemia in chronic kidney disease - Hg and Hct stable and at baseline - CBC in AM   Chronic kidney disease, stage III (moderate) - Cr is still elevated - will stop Enalaprilat and will continue gentle hydration with NS  - repeat BMP in AM   Type II or unspecified type diabetes  mellitus with renal manifestations, uncontrolled - continue SSI for now until oral intake improves    Cardiomyopathy: Per 2 D echo 10/2013 EF 30-35% - gentle hydration as noted above  - weight today is 191 lbs   Hypokalemia - supplemented and WNL this AM   Decubitus ulcer of right buttock, stage 2 - wound care consult    Moderate malnutrition - advance diet as pt able to tolerate - keep NPO after midnight, plan for EGD in AM  Consultants:  ID  GI  Wound care  Procedures/Studies:  None Antibiotics:  Vancomycin 4/25 --> 4/28  Zosyn 4/25 --> 4/28  Maxipime 4/28 -->  Code Status: Full Family Communication: Brother at bedside  Disposition Plan: Keep in SDU  HPI/Subjective: No events overnight.   Objective: Filed Vitals:   01/12/14 0400 01/12/14 0500 01/12/14 0800 01/12/14 0820  BP: 153/66 166/76  156/82  Pulse: 97 105  90  Temp: 99.5 F (37.5 C)  98.4 F (36.9 C)   TempSrc: Oral  Oral   Resp: 17 17  13   Height:      Weight: 86.8 kg (191 lb 5.8 oz)     SpO2: 100% 98%  97%    Intake/Output Summary (Last 24 hours) at 01/12/14 1359 Last data filed at 01/12/14 0800  Gross per 24 hour  Intake 1787.5 ml  Output    810 ml  Net  977.5 ml    Exam:   General:  Pt is alert, follows commands appropriately, not in acute distress  Cardiovascular:  Regular rate and rhythm, S1/S2, no murmurs, no rubs, no gallops  Respiratory: Clear to auscultation bilaterally, no wheezing, no crackles, no rhonchi  Abdomen: Soft, non tender, non distended, bowel sounds present, no guarding   Data Reviewed: Basic Metabolic Panel:  Recent Labs Lab 01/08/14 1530 01/08/14 2120 01/09/14 0400 01/10/14 0540 01/11/14 0540 01/12/14 0530  NA 145  --  147 144 142 142  K 3.4*  --  3.6* 3.9 4.1 4.4  CL 106  --  112 111 109 110  CO2 23  --  23 24 21 23   GLUCOSE 158*  --  127* 139* 122* 125*  BUN 22  --  21 12 8 6   CREATININE 1.16  --  1.28 1.39* 1.40* 1.37*  CALCIUM 9.8  --  8.9  8.9 8.5 8.9  MG  --  1.8  --  2.0  --   --    Liver Function Tests:  Recent Labs Lab 01/08/14 1530 01/09/14 0400  AST 15 16  ALT 8 7  ALKPHOS 76 62  BILITOT 0.2* 0.3  PROT 7.0 5.9*  ALBUMIN 3.2* 2.8*   CBC:  Recent Labs Lab 01/08/14 1530 01/08/14 2120 01/09/14 0400 01/10/14 0540 01/11/14 0540 01/12/14 0530  WBC 11.1* 9.8 8.5 6.4 5.9 6.0  NEUTROABS 9.0*  --   --   --   --   --   HGB 13.9 13.0 12.2* 9.6* 9.6* 9.6*  HCT 42.9 40.1 39.1 31.0* 30.5* 31.4*  MCV 79.2 79.4 82.1 81.8 81.1 81.8  PLT 460* 370 339 229 238 255   Cardiac Enzymes:  Recent Labs Lab 01/08/14 1530 01/08/14 2120 01/09/14 0400 01/09/14 0959  TROPONINI <0.30 <0.30 <0.30 <0.30   CBG:  Recent Labs Lab 01/11/14 1154 01/11/14 1701 01/11/14 2148 01/12/14 0750 01/12/14 1212  GLUCAP 133* 180* 115* 122* 123*    Recent Results (from the past 240 hour(s))  URINE CULTURE     Status: None   Collection Time    01/08/14  2:50 PM      Result Value Ref Range Status   Specimen Description URINE, CATHETERIZED   Final   Special Requests NONE   Final   Culture  Setup Time     Final   Value: 01/08/2014 17:48     Performed at [place]     Final   Value: >=100,000 COLONIES/ML     Performed at [facility]   Culture     Final   Value: [place]     Performed at [facility]   Report Status 01/12/2014 FINAL   Final   Organism ID, Bacteria KLEBSIELLA PNEUMONIAE   Final   Organism ID, Bacteria PROTEUS MIRABILIS   Final  CULTURE, BLOOD (ROUTINE X 2)     Status: None   Collection Time    01/08/14  3:30 PM      Result Value Ref Range Status   Specimen Description BLOOD LEFT ANTECUBITAL   Final   Special Requests BOTTLES DRAWN AEROBIC AND ANAEROBIC 2CC   Final   Culture  Setup Time     Final   Value: 01/08/2014 21:32     Performed at [facility]   Culture     Final   Value: STAPHYLOCOCCUS SPECIES (COAGULASE NEGATIVE)      Note: THE SIGNIFICANCE OF ISOLATING THIS ORGANISM FROM A SINGLE VENIPUNCTURE CANNOT BE PREDICTED WITHOUT FURTHER CLINICAL AND CULTURE CORRELATION. SUSCEPTIBILITIES AVAILABLE  ONLY ON REQUEST.     Note: Gram Stain Report Called to,Read Back By and Verified With: Carlyn Reichert 01/09/14 @ 5PM BY [name]     Performed at [facility]   Report Status 01/11/2014 FINAL   Final  MRSA PCR SCREENING     Status: None   Collection Time    01/08/14  8:10 PM      Result Value Ref Range Status   MRSA by PCR NEGATIVE  NEGATIVE Final   Comment:            The GeneXpert MRSA Assay (FDA     approved for NASAL specimens     only), is one component of a     comprehensive MRSA colonization     surveillance program. It is not     intended to diagnose MRSA     infection nor to guide or     monitor treatment for     MRSA infections.  URINE CULTURE     Status: None   Collection Time    01/08/14  9:38 PM      Result Value Ref Range Status   Specimen Description URINE, RANDOM   Final   Special Requests NONE   Final   Culture  Setup Time     Final   Value: 01/09/2014 03:53     Performed at [place]     Final   Value: 50,000 COLONIES/ML     Performed at [facility]   Culture     Final   Value: Multiple bacterial morphotypes present, none predominant. Suggest appropriate recollection if clinically indicated.     Performed at [facility]   Report Status 01/11/2014 FINAL   Final     Scheduled Meds: . ceFEPime (MAXIPIME) IV  1 g Intravenous 3 times per day  . enalaprilat  0.625 mg Intravenous 4 times per day  . feeding supplement (ENSURE COMPLETE)  237 mL Oral BID BM  . ferrous sulfate  325 mg Oral BID WC  . hydrALAZINE  50 mg Oral TID  . insulin aspart  0-9 Units Subcutaneous TID WC  . metoCLOPramide (REGLAN) injection  5 mg Intravenous 4 times per day  . metoprolol  10 mg Intravenous Q6H  . pantoprazole (PROTONIX) IV  40 mg Intravenous Q12H  .  sodium chloride  10-40 mL Intracatheter Q12H  . sodium chloride  3 mL Intravenous Q12H  . tamsulosin  0.4 mg Oral Daily   Continuous Infusions: . sodium chloride 20 mL/hr at 01/12/14 1051  . dextrose 5 % and 0.45% NaCl 1,000 mL with potassium chloride 40 mEq infusion 75 mL/hr at 01/12/14 0618   Theodis Blaze, MD  [facility] Pager [phone]  If 7PM-7AM, please contact night-coverage [email or web address] Password [email or web address] 01/12/2014, 1:59 PM   LOS: 4 days

## 2014-01-12 NOTE — Progress Notes (Signed)
Patient ID: [name], male   DOB: 27-Dec-1945, 68 y.o.   MRN: [id number]         [facility]    Date of Admission:  01/08/2014           Day 5 antibiotics        Day 2 cefepime  Principal Problem:   Sepsis Active Problems:   UTI (urinary tract infection)   Bacteremia   Dementia   Stroke   GERD (gastroesophageal reflux disease)   UGIB (upper gastrointestinal bleed)   Hemiparesis affecting right side as late effect of stroke   Expressive aphasia   Hypertension, accelerated   BPH (benign prostatic hyperplasia)   Dyslipidemia   Depression   Anemia in chronic kidney disease(285.21)   Chronic kidney disease, stage III (moderate)   Type II or unspecified type diabetes mellitus with renal manifestations, uncontrolled   Tachycardia   Cardiomyopathy: Per 2 D echo 10/22/13 EF 30-35%   Coffee ground emesis   Hypokalemia   Decubitus ulcer of right buttock, stage 2   Acute blood loss anemia   Dysphagia, unspecified(787.20)   . ceFEPime (MAXIPIME) IV  1 g Intravenous 3 times per day  . feeding supplement (ENSURE COMPLETE)  237 mL Oral BID BM  . ferrous sulfate  325 mg Oral BID WC  . hydrALAZINE  50 mg Oral TID  . insulin aspart  0-9 Units Subcutaneous TID WC  . metoCLOPramide (REGLAN) injection  5 mg Intravenous 4 times per day  . metoprolol  10 mg Intravenous Q6H  . pantoprazole (PROTONIX) IV  40 mg Intravenous Q12H  . sodium chloride  10-40 mL Intracatheter Q12H  . sodium chloride  3 mL Intravenous Q12H  . tamsulosin  0.4 mg Oral Daily    Subjective:  He is feeling better.  Objective: Temp:  [98.3 F (36.8 C)-100.1 F (37.8 C)] 98.4 F (36.9 C) (04/29 0800) Pulse Rate:  [90-159] 95 (04/29 1400) Resp:  [13-31] 23 (04/29 1400) BP: (140-188)/(48-97) 184/80 mmHg (04/29 1400) SpO2:  [97 %-100 %] 100 % (04/29 1400) Weight:  [191 lb 5.8 oz (86.8 kg)] 191 lb 5.8 oz (86.8 kg) (04/29 0400)  General: He is alert, comfortable watching TV. He is  talkative and interactive  Lab Results Lab Results  Component Value Date   WBC 6.0 01/12/2014   HGB 9.6* 01/12/2014   HCT 31.4* 01/12/2014   MCV 81.8 01/12/2014   PLT 255 01/12/2014    Lab Results  Component Value Date   CREATININE 1.37* 01/12/2014   BUN 6 01/12/2014   NA 142 01/12/2014   K 4.4 01/12/2014   CL 110 01/12/2014   CO2 23 01/12/2014    Lab Results  Component Value Date   ALT 7 01/09/2014   AST 16 01/09/2014   ALKPHOS 62 01/09/2014   BILITOT 0.3 01/09/2014      Microbiology: Recent Results (from the past 240 hour(s))  URINE CULTURE     Status: None   Collection Time    01/08/14  2:50 PM      Result Value Ref Range Status   Specimen Description URINE, CATHETERIZED   Final   Special Requests NONE   Final   Culture  Setup Time     Final   Value: 01/08/2014 17:48     Performed at [place]     Final   Value: >=100,000 COLONIES/ML     Performed at [facility]  Culture     Final   Value: KLEBSIELLA PNEUMONIAE     PROTEUS MIRABILIS     Performed at [facility]   Report Status 01/12/2014 FINAL   Final   Organism ID, Bacteria KLEBSIELLA PNEUMONIAE   Final   Organism ID, Bacteria PROTEUS MIRABILIS   Final  CULTURE, BLOOD (ROUTINE X 2)     Status: None   Collection Time    01/08/14  3:30 PM      Result Value Ref Range Status   Specimen Description BLOOD LEFT ANTECUBITAL   Final   Special Requests BOTTLES DRAWN AEROBIC AND ANAEROBIC 2CC   Final   Culture  Setup Time     Final   Value: 01/08/2014 21:32     Performed at [facility]   Culture     Final   Value: STAPHYLOCOCCUS SPECIES (COAGULASE NEGATIVE)     Note: THE SIGNIFICANCE OF ISOLATING THIS ORGANISM FROM A SINGLE VENIPUNCTURE CANNOT BE PREDICTED WITHOUT FURTHER CLINICAL AND CULTURE CORRELATION. SUSCEPTIBILITIES AVAILABLE ONLY ON REQUEST.     Note: Gram Stain Report Called to,Read Back By and Verified With: Carlyn Reichert 01/09/14 @ 5PM BY [name]     Performed at  [facility]   Report Status 01/11/2014 FINAL   Final  MRSA PCR SCREENING     Status: None   Collection Time    01/08/14  8:10 PM      Result Value Ref Range Status   MRSA by PCR NEGATIVE  NEGATIVE Final   Comment:            The GeneXpert MRSA Assay (FDA     approved for NASAL specimens     only), is one component of a     comprehensive MRSA colonization     surveillance program. It is not     intended to diagnose MRSA     infection nor to guide or     monitor treatment for     MRSA infections.  URINE CULTURE     Status: None   Collection Time    01/08/14  9:38 PM      Result Value Ref Range Status   Specimen Description URINE, RANDOM   Final   Special Requests NONE   Final   Culture  Setup Time     Final   Value: 01/09/2014 03:53     Performed at [facility]     Final   Value: 50,000 COLONIES/ML     Performed at [facility]   Culture     Final   Value: Multiple bacterial morphotypes present, none predominant. Suggest appropriate recollection if clinically indicated.     Performed at [facility]   Report Status 01/11/2014 FINAL   Final   Assessment: He is improving on therapy for pyelonephritis. I will change him to oral levofloxacin to cover both Klebsiella and Proteus. He needs another 5 days of therapy.  Plan: 1. Change IV cefepime to oral levofloxacin and treat 5 more days  Michel Bickers, MD [facility] for [place] [phone] pager   [phone] cell 01/12/2014, 4:45 PM

## 2014-01-12 NOTE — Progress Notes (Signed)
Speech Language Pathology Treatment: Dysphagia  Patient Details Name: Ricky BISCHOF MRN: [id number] DOB: May 08, 1946 Today's Date: 01/12/2014 Time: 4098-1191 SLP Time Calculation (min): 17 min  Assessment / Plan / Recommendation Clinical Impression  Pt with adequate oropharyngeal swallowing ability for diet, he tolerated his [place], mashed potatoes and green beans today per RN.  Pt projectile vomited after consuming Brendolyn Patty food last pm.  SLP observed pt consuming Ensure, soda, few graham crackers with slow mastication of cracker, delayed oral transiting/holding and right anterior labial spill of secretions/cracker.   SLP facilitated oral clearance by cueing pt to swallow to clear stasis.   Oral dysphagia likely due to his weakness from CVA and motor planning deficits.    Pt did complain of pain with swallow with direct SLP question - pointing to distal esophagus- followed by pt belching.  SLP provided pt with pan for emesis is needed and advised he cease intake until resolution of discomfort/backflow sensation.    Suspect primary risk of aspiration is due to his known GI issues for which GI is managing.  Recommend to continue mechanical soft/ground meat diet due to oral deficits/dentition and follow up briefly with SNF SLP for solid food advancement as pt medically progresses.    SLP to sign off as all education completed and goals met.  Thanks.    HPI HPI: 68 yo male adm to [facility] with coffee ground emesis from SNF ([place]).  Pt has extensive hx including left CVA with aphasia, dysarthria and right hemiparesis, gastroparesis, reflux, esophagits, paralytic ileus, dementia.  Pt recently admitted to hospital Feb 2015 with vomiting blood.  Pt is on a regular/thin diet at SNF and was discharged from SLP caseload on March 9th.  Bedside swallow evaluation completed two days ago with recommendation for clear and advance to mechanical soft when GI approves.  SLP follow up to determine tolerance of  diet, educate pt to recommendations and sign off.  Pt has not had endoscopic evaluation completed yet but is scheduled for next date.    Pertinent Vitals Afebrile, decreased  SLP Plan  All goals met    Recommendations Diet recommendations: Dysphagia 3 (mechanical soft);Thin liquid Liquids provided via: Cup;Straw Medication Administration: Crushed with puree (whole if contraindicated to crush) Supervision: Intermittent supervision to cue for compensatory strategies (set up assist, pt with right hemiparesis) Compensations: Slow rate;Small sips/bites;Check for pocketing (oral suction after intake prn)              Oral Care Recommendations: Oral care before and after PO (check for oral stasis right) Follow up Recommendations: Skilled Nursing facility (for dietary advancement as pt medically improves) Plan: All goals met    [place], [facility] SLP [phone]

## 2014-01-12 NOTE — Progress Notes (Signed)
[facility] Progress Note  Subjective: No further reports of coffee-ground emesis  Objective: Vital signs in last 24 hours: Temp:  [98.1 F (36.7 C)-100.1 F (37.8 C)] 98.4 F (36.9 C) (04/29 0800) Pulse Rate:  [90-159] 90 (04/29 0820) Resp:  [13-31] 13 (04/29 0820) BP: (140-188)/(48-97) 156/82 mmHg (04/29 0820) SpO2:  [97 %-100 %] 97 % (04/29 0820) Weight:  [86.8 kg (191 lb 5.8 oz)] 86.8 kg (191 lb 5.8 oz) (04/29 0400) Weight change:    PE:  No distress  Heart regular rhythm  Lungs clear  Abdomen soft nontender  Lab Results: Results for orders placed during the hospital encounter of 01/08/14 (from the past 24 hour(s))  GLUCOSE, CAPILLARY     Status: Abnormal   Collection Time    01/11/14  5:01 PM      Result Value Ref Range   Glucose-Capillary 180 (*) 70 - 99 mg/dL  GLUCOSE, CAPILLARY     Status: Abnormal   Collection Time    01/11/14  9:48 PM      Result Value Ref Range   Glucose-Capillary 115 (*) 70 - 99 mg/dL   Comment 1 Documented in Chart     Comment 2 Notify RN    CBC     Status: Abnormal   Collection Time    01/12/14  5:30 AM      Result Value Ref Range   WBC 6.0  4.0 - 10.5 K/uL   RBC 3.84 (*) 4.22 - 5.81 MIL/uL   Hemoglobin 9.6 (*) 13.0 - 17.0 g/dL   HCT 31.4 (*) 39.0 - 52.0 %   MCV 81.8  78.0 - 100.0 fL   MCH 25.0 (*) 26.0 - 34.0 pg   MCHC 30.6  30.0 - 36.0 g/dL   RDW 16.1 (*) 11.5 - 15.5 %   Platelets 255  150 - 400 K/uL  BASIC METABOLIC PANEL     Status: Abnormal   Collection Time    01/12/14  5:30 AM      Result Value Ref Range   Sodium 142  137 - 147 mEq/L   Potassium 4.4  3.7 - 5.3 mEq/L   Chloride 110  96 - 112 mEq/L   CO2 23  19 - 32 mEq/L   Glucose, Bld 125 (*) 70 - 99 mg/dL   BUN 6  6 - 23 mg/dL   Creatinine, Ser 1.37 (*) 0.50 - 1.35 mg/dL   Calcium 8.9  8.4 - 10.5 mg/dL   GFR calc non Af Amer 51 (*) >90 mL/min   GFR calc Af Amer 60 (*) >90 mL/min  GLUCOSE, CAPILLARY     Status: Abnormal   Collection Time   01/12/14  7:50 AM      Result Value Ref Range   Glucose-Capillary 122 (*) 70 - 99 mg/dL   Comment 1 Documented in Chart     Comment 2 Notify RN    GLUCOSE, CAPILLARY     Status: Abnormal   Collection Time    01/12/14 12:12 PM      Result Value Ref Range   Glucose-Capillary 123 (*) 70 - 99 mg/dL   Comment 1 Notify RN     Comment 2 Documented in Chart      Studies/Results: No results found.    Assessment: Coffee-ground emesis probably related to esophagitis  Plan: EGD planned for tomorrow. We were able to get consent from his brother    Wonda Horner 01/12/2014, 2:12 PM  Lab Results  Component Value Date  HGB 9.6* 01/12/2014   HGB 9.6* 01/11/2014   HGB 9.6* 01/10/2014   HCT 31.4* 01/12/2014   HCT 30.5* 01/11/2014   HCT 31.0* 01/10/2014   ALKPHOS 62 01/09/2014   ALKPHOS 76 01/08/2014   ALKPHOS 80 10/20/2013   AST 16 01/09/2014   AST 15 01/08/2014   AST 16 10/20/2013   ALT 7 01/09/2014   ALT 8 01/08/2014   ALT 11 10/20/2013

## 2014-01-12 NOTE — Progress Notes (Signed)
CSW continuing to follow as pt admitted from [facility] and plan is for pt to return upon discharge.  CSW received update from RN and pt not yet medically stable for discharge. Per RN, plan is for upper endo hopefully today.  CSW sent updated clinicals to [facility].   CSW to continue to follow and facilitate pt discharge needs when pt medically ready for discharge.  Alison Murray, MSW, [place] [facility] [phone]

## 2014-01-12 NOTE — Progress Notes (Signed)
INITIAL NUTRITION ASSESSMENT  DOCUMENTATION CODES Per approved criteria  -Not Applicable   INTERVENTION: - Ensure Complete BID, given once pt stops vomiting - Diet per SLP - Will continue to monitor   NUTRITION DIAGNOSIS: Swallowing difficulty related to possibly from hx of CVA as evidenced by SLP evaluation.   Goal: Pt able to safely consume >90% of meals/supplements  Monitor:  Weights, labs, intake, vomiting   Reason for Assessment: Low braden  68 y.o. male  Admitting Dx: Sepsis  ASSESSMENT: Pt with history of stroke with right-sided hemiparesis, dementia, BPH with chronic indwelling Foley catheter, hypertension, chronic kidney disease, gastroesophageal reflux disease, history of severe ulcerative esophagitis who was hospitalized in February of 2015 for hematemesis, presented to the ED with several episodes of coffee-ground emesis. Started 01/08/14. GI following and plan is for upper endoscopy today.   Met with pt who reports eating well PTA, 2 meals/day. No documentation of nutritional supplements on medication list PTA. Seen by SLP 4/27 who noted pt with multifactorial dysphagia with recommendations for thin liquids. Per SLP, pt was on a regular/thin liquid diet at SNF however RN reported pt choked on liquids 4/27. Per conversation with RN, pt ate some burger and fries last night and then had projectile vomiting. RN reports when pt is given medications, they will sit on his tongue for awhile before he eventually swallows them, denies any pocketing behavior. Weight stable PTA.    Height: Ht Readings from Last 1 Encounters:  01/09/14 6' 0.05" (1.83 m)    Weight: Wt Readings from Last 1 Encounters:  01/12/14 191 lb 5.8 oz (86.8 kg)    Ideal Body Weight: 178 lbs   % Ideal Body Weight: 107%  Wt Readings from Last 10 Encounters:  01/12/14 191 lb 5.8 oz (86.8 kg)  01/12/14 191 lb 5.8 oz (86.8 kg)  10/21/13 192 lb 10.9 oz (87.4 kg)  01/28/13 182 lb (82.555 kg)  11/29/12  189 lb 13.1 oz (86.1 kg)  11/25/12 188 lb 11.2 oz (85.594 kg)  11/25/12 188 lb 11.2 oz (85.594 kg)  11/20/12 190 lb 6.4 oz (86.365 kg)  11/20/12 190 lb 6.4 oz (86.365 kg)  11/20/12 190 lb 6.4 oz (86.365 kg)    Usual Body Weight: 192 lbs 2 months ago   % Usual Body Weight: 99%  BMI:  Body mass index is 25.92 kg/(m^2).  Estimated Nutritional Needs: Kcal: 2150-2350 Protein: 115-130g Fluid: 2.1-2.3L/day   Skin: stage 2 right buttocks pressure ulcer   Diet Order: Criss Rosales  EDUCATION NEEDS: -No education needs identified at this time   Intake/Output Summary (Last 24 hours) at 01/12/14 1322 Last data filed at 01/12/14 0800  Gross per 24 hour  Intake   1800 ml  Output    810 ml  Net    990 ml    Last BM: 4/28   Labs:   Recent Labs Lab 01/08/14 2120  01/10/14 0540 01/11/14 0540 01/12/14 0530  NA  --   < > 144 142 142  K  --   < > 3.9 4.1 4.4  CL  --   < > 111 109 110  CO2  --   < > _0 BUN  --   < > _1 CREATININE  --   < > 1.39* 1.40* 1.37*  CALCIUM  --   < > 8.9 8.5 8.9  MG 1.8  --  2.0  --   --   GLUCOSE  --   < >  139* 122* 125*  < > = values in this interval not displayed.  CBG (last 3)   Recent Labs  01/11/14 2148 01/12/14 0750 01/12/14 1212  GLUCAP 115* 122* 123*    Scheduled Meds: . ceFEPime (MAXIPIME) IV  1 g Intravenous 3 times per day  . enalaprilat  0.625 mg Intravenous 4 times per day  . ferrous sulfate  325 mg Oral BID WC  . hydrALAZINE  50 mg Oral TID  . insulin aspart  0-9 Units Subcutaneous TID WC  . metoCLOPramide (REGLAN) injection  5 mg Intravenous 4 times per day  . metoprolol  10 mg Intravenous Q6H  . pantoprazole (PROTONIX) IV  40 mg Intravenous Q12H  . sodium chloride  10-40 mL Intracatheter Q12H  . sodium chloride  3 mL Intravenous Q12H  . tamsulosin  0.4 mg Oral Daily    Continuous Infusions: . sodium chloride 20 mL/hr at 01/12/14 1051  . dextrose 5 % and 0.45% NaCl 1,000 mL with potassium chloride 40 mEq  infusion 75 mL/hr at 01/12/14 6301    Past Medical History  Diagnosis Date  . Dementia   . Stroke   . Hydronephrosis   . Hypertension   . Hyperlipemia   . GERD (gastroesophageal reflux disease)   . BPH (benign prostatic hyperplasia)   . Cardiomyopathy: Per 2 D echo 10/22/13 EF 30-35% 01/08/2014    Past Surgical History  Procedure Laterality Date  . Esophagogastroduodenoscopy N/A 11/11/2012    Procedure: ESOPHAGOGASTRODUODENOSCOPY (EGD);  Surgeon: Lear Ng, MD;  Location: [facility] ENDOSCOPY;  Service: Endoscopy;  Laterality: N/A;  . Subxyphoid pericardial window N/A 11/13/2012    Procedure: SUBXYPHOID PERICARDIAL WINDOW;  Surgeon: Grace Isaac, MD;  Location: [place];  Service: Thoracic;  Laterality: N/A;  . Esophagogastroduodenoscopy N/A 11/25/2012    Procedure: ESOPHAGOGASTRODUODENOSCOPY (EGD);  Surgeon: Winfield Cunas., MD;  Location[facility] ENDOSCOPY;  Service: Endoscopy;  Laterality: N/A;    Mikey College MS, [place], [place] Pager [phone] After Hours Pager

## 2014-01-13 ENCOUNTER — Encounter (HOSPITAL_COMMUNITY)
Admission: EM | Disposition: A | Discharge status: Skilled Nursing Facility | Payer: Self-pay | Source: Home / Self Care | Attending: Internal Medicine

## 2014-01-13 ENCOUNTER — Inpatient Hospital Stay (HOSPITAL_COMMUNITY): Payer: Medicare Other | Admitting: Registered Nurse

## 2014-01-13 ENCOUNTER — Encounter (HOSPITAL_COMMUNITY): Payer: Self-pay | Admitting: *Deleted

## 2014-01-13 ENCOUNTER — Encounter (HOSPITAL_COMMUNITY): Payer: Medicare Other | Admitting: Registered Nurse

## 2014-01-13 DIAGNOSIS — I129 Hypertensive chronic kidney disease with stage 1 through stage 4 chronic kidney disease, or unspecified chronic kidney disease: Secondary | ICD-10-CM

## 2014-01-13 DIAGNOSIS — Z8673 Personal history of transient ischemic attack (TIA), and cerebral infarction without residual deficits: Secondary | ICD-10-CM

## 2014-01-13 HISTORY — PX: ESOPHAGOGASTRODUODENOSCOPY: SHX5428

## 2014-01-13 LAB — BASIC METABOLIC PANEL
BUN: 5 mg/dL — ABNORMAL LOW (ref 6–23)
BUN: 5 mg/dL — ABNORMAL LOW (ref 6–23)
CHLORIDE: 106 meq/L (ref 96–112)
CO2: 21 meq/L (ref 19–32)
CO2: 22 meq/L (ref 19–32)
Calcium: 9 mg/dL (ref 8.4–10.5)
Calcium: 9.5 mg/dL (ref 8.4–10.5)
Chloride: 109 mEq/L (ref 96–112)
Creatinine, Ser: 1.21 mg/dL (ref 0.50–1.35)
Creatinine, Ser: 1.34 mg/dL (ref 0.50–1.35)
GFR calc Af Amer: 69 mL/min — ABNORMAL LOW (ref >90)
GFR calc Af Amer: 61 mL/min — ABNORMAL LOW (ref >90)
GFR calc non Af Amer: 60 mL/min — ABNORMAL LOW (ref >90)
GFR calc non Af Amer: 53 mL/min — ABNORMAL LOW (ref >90)
GLUCOSE: 154 mg/dL — AB (ref 70–99)
Glucose, Bld: 93 mg/dL (ref 70–99)
POTASSIUM: 4.4 meq/L (ref 3.7–5.3)
Potassium: 4.6 mEq/L (ref 3.7–5.3)
SODIUM: 138 meq/L (ref 137–147)
Sodium: 143 mEq/L (ref 137–147)

## 2014-01-13 LAB — CBC
HCT: 33.3 % — ABNORMAL LOW (ref 39.0–52.0)
Hemoglobin: 10.2 g/dL — ABNORMAL LOW (ref 13.0–17.0)
MCH: 25.1 pg — ABNORMAL LOW (ref 26.0–34.0)
MCHC: 30.6 g/dL (ref 30.0–36.0)
MCV: 82 fL (ref 78.0–100.0)
Platelets: 295 10*3/uL (ref 150–400)
RBC: 4.06 MIL/uL — AB (ref 4.22–5.81)
RDW: 15.9 % — AB (ref 11.5–15.5)
WBC: 5.3 10*3/uL (ref 4.0–10.5)

## 2014-01-13 LAB — GLUCOSE, CAPILLARY
GLUCOSE-CAPILLARY: 94 mg/dL (ref 70–99)
GLUCOSE-CAPILLARY: 152 mg/dL — AB (ref 70–99)
Glucose-Capillary: 101 mg/dL — ABNORMAL HIGH (ref 70–99)

## 2014-01-13 LAB — PHOSPHORUS: Phosphorus: 2.2 mg/dL — ABNORMAL LOW (ref 2.3–4.6)

## 2014-01-13 LAB — MAGNESIUM: Magnesium: 2.5 mg/dL (ref 1.5–2.5)

## 2014-01-13 LAB — TROPONIN I

## 2014-01-13 SURGERY — EGD (ESOPHAGOGASTRODUODENOSCOPY)
Anesthesia: Monitor Anesthesia Care

## 2014-01-13 MED ORDER — METOPROLOL TARTRATE 1 MG/ML IV SOLN: 5.0000 mg | INTRAVENOUS | Status: AC

## 2014-01-13 MED ORDER — LABETALOL HCL 5 MG/ML IV SOLN
INTRAVENOUS | Status: DC | PRN
  Administered 2014-01-13: 5 mg via INTRAVENOUS

## 2014-01-13 MED ORDER — MAGNESIUM SULFATE 40 MG/ML IJ SOLN
2.0000 g | Freq: Once | INTRAMUSCULAR | Status: AC
  Administered 2014-01-13: 2 g via INTRAVENOUS
  Filled 2014-01-13: qty 50

## 2014-01-13 MED ORDER — MIDAZOLAM HCL 2 MG/2ML IJ SOLN
INTRAMUSCULAR | Status: AC
  Filled 2014-01-13: qty 2

## 2014-01-13 MED ORDER — PROPOFOL 10 MG/ML IV BOLUS
INTRAVENOUS | Status: AC
  Filled 2014-01-13: qty 20

## 2014-01-13 MED ORDER — PROPOFOL INFUSION 10 MG/ML OPTIME
INTRAVENOUS | Status: DC | PRN
  Administered 2014-01-13: 50 ug/kg/min via INTRAVENOUS

## 2014-01-13 MED ORDER — BUTAMBEN-TETRACAINE-BENZOCAINE 2-2-14 % EX AERO
INHALATION_SPRAY | CUTANEOUS | Status: DC | PRN
  Administered 2014-01-13: 1 via TOPICAL

## 2014-01-13 MED ORDER — MIDAZOLAM HCL 5 MG/5ML IJ SOLN
INTRAMUSCULAR | Status: DC | PRN
  Administered 2014-01-13: 0.5 mg via INTRAVENOUS

## 2014-01-13 MED ORDER — LIDOCAINE HCL (CARDIAC) 20 MG/ML IV SOLN
INTRAVENOUS | Status: AC
  Filled 2014-01-13: qty 5

## 2014-01-13 MED ORDER — KETAMINE HCL 10 MG/ML IJ SOLN
INTRAMUSCULAR | Status: DC | PRN
  Administered 2014-01-13: 10 mg via INTRAVENOUS
  Administered 2014-01-13: 20 mg via INTRAVENOUS

## 2014-01-13 MED ORDER — AMIODARONE LOAD VIA INFUSION
150.0000 mg | Freq: Once | INTRAVENOUS | Status: AC
  Administered 2014-01-13: 150 mg via INTRAVENOUS
  Filled 2014-01-13 (×2): qty 83.34

## 2014-01-13 MED ORDER — LABETALOL HCL 5 MG/ML IV SOLN
INTRAVENOUS | Status: AC
Start: 2014-01-13 — End: 2014-01-13
  Filled 2014-01-13: qty 4

## 2014-01-13 MED ORDER — LIDOCAINE HCL (CARDIAC) 20 MG/ML IV SOLN
INTRAVENOUS | Status: DC | PRN
  Administered 2014-01-13: 100 mg via INTRAVENOUS

## 2014-01-13 MED ORDER — POTASSIUM PHOSPHATES 15 MMOLE/5ML IV SOLN
10.0000 mmol | Freq: Once | INTRAVENOUS | Status: AC
  Administered 2014-01-13: 10 mmol via INTRAVENOUS
  Filled 2014-01-13: qty 3.33

## 2014-01-13 NOTE — Transfer of Care (Signed)
Immediate Anesthesia Transfer of Care Note  Patient: [name]  Procedure(s) Performed: Procedure(s): ESOPHAGOGASTRODUODENOSCOPY (EGD) (N/A)  Patient Location: PACU and Endoscopy Unit  Anesthesia Type:MAC  Level of Consciousness: awake, alert , oriented and patient cooperative  Airway & Oxygen Therapy: Patient Spontanous Breathing and Patient connected to nasal cannula oxygen  Post-op Assessment: Report given to PACU RN, Post -op Vital signs reviewed and stable and Patient moving all extremities  Post vital signs: Reviewed and stable  Complications: No apparent anesthesia complications

## 2014-01-13 NOTE — Progress Notes (Signed)
Pt with acute HR spike, SVT present and diastyolic pressure extremely elevated. Anesthesia present - Dr. Winfred Leeds, [place],CRNA..... Labetalol 5mg , IV given, Esmolol given.  Pt choking, has difficulty handling his secretions at baseline d/t his CVA hx....suctioned.  Responded well to medication for HTN, HR.  Able to preform swift EGD without incident.Marland KitchenMarland KitchenMarland Kitchen

## 2014-01-13 NOTE — Anesthesia Preprocedure Evaluation (Addendum)
Anesthesia Evaluation  Patient identified by MRN, date of birth, ID band Patient awake    Reviewed: Allergy & Precautions, H&P , NPO status , Patient's Chart, lab work & pertinent test results  Airway Mallampati: III TM Distance: >3 FB     Dental  (+) Poor Dentition, Missing, Dental Advisory Given   Pulmonary neg pulmonary ROS,  breath sounds clear to auscultation  Pulmonary exam normal       Cardiovascular hypertension, Pt. on medications and Pt. on home beta blockers +CHF Rhythm:Regular Rate:Tachycardia  Cardiac Troponin negative X3.   Neuro/Psych Depression Dementia CVA, Residual Symptoms    GI/Hepatic Neg liver ROS, GERD-  ,  Endo/Other  diabetes, Poorly Controlled, Type 2  Renal/GU Renal Insufficiency and CRFRenal disease  negative genitourinary   Musculoskeletal   Abdominal   Peds  Hematology negative hematology ROS (+) anemia ,   Anesthesia Other Findings   Reproductive/Obstetrics                        Anesthesia Physical Anesthesia Plan  ASA: IV  Anesthesia Plan: MAC   Post-op Pain Management:    Induction: Intravenous  Airway Management Planned: Nasal Cannula  Additional Equipment:   Intra-op Plan:   Post-operative Plan:   Informed Consent: I have reviewed the patients History and Physical, chart, labs and discussed the procedure including the risks, benefits and alternatives for the proposed anesthesia with the patient or authorized representative who has indicated his/her understanding and acceptance.   Dental advisory given  Plan Discussed with: CRNA  Anesthesia Plan Comments:         Anesthesia Quick Evaluation

## 2014-01-13 NOTE — Op Note (Signed)
[facility] [place], [id number]   ENDOSCOPY PROCEDURE REPORT  PATIENT: Ricky, Snyder.  MR#: [id number] BIRTHDATE: [birth year] , 67  yrs. old GENDER: Male ENDOSCOPIST: Acquanetta Sit, MD REFERRED BY: PROCEDURE DATE:  01/13/2014 PROCEDURE:   EGD ASA CLASS: 3 INDICATIONS: hematemesis MEDICATIONS: per anesthesia TOPICAL ANESTHETIC:  DESCRIPTION OF PROCEDURE:   After the risks benefits and alternatives of the procedure were thoroughly explained, informed consent was obtained.  The Pentax Gastroscope [id number]  endoscope was introduced through the mouth and advanced to the second portion of the duodenum      , limited by Without limitations.   The instrument was slowly withdrawn as the mucosa was fully examined.      FINDINGS:no evidence of active bleeding   Esophagus: Distal ulcerative, erosive esophagitis  Stomach: Normal  Duodenum: Normal COMPLICATIONS:no endoscopic complications  ENDOSCOPIC IMPRESSION:see above   RECOMMENDATIONS:continue PPI therapy, we will sign off.      _______________________________ Lorrin MaisAcquanetta Sit, MD 01/13/2014 2:30 PM   [Instructions Given]    PATIENT NAME:  [name]. MR#: [id number]

## 2014-01-13 NOTE — Progress Notes (Signed)
eLink Physician-Brief Progress Note Patient Name: WANDA CELLUCCI DOB: 09-Jul-1946 MRN: [id number]  Date of Service  01/13/2014   HPI/Events of Note    Recent Labs Lab 01/08/14 1530 01/08/14 2120 01/09/14 0400 01/09/14 0959 01/13/14 1823  TROPONINI <0.30 <0.30 <0.30 <0.30 <0.30     Recent Labs Lab 01/08/14 2120  01/10/14 0540 01/11/14 0540 01/12/14 0530 01/13/14 0350 01/13/14 1823  NA  --   < > 144 142 142 138 143  K  --   < > 3.9 4.1 4.4 4.6 4.4  CL  --   < > 111 109 110 106 109  CO2  --   < > 24 21 23 21 22   GLUCOSE  --   < > 139* 122* 125* 93 154*  BUN  --   < > 12 8 6  5* 5*  CREATININE  --   < > 1.39* 1.40* 1.37* 1.21 1.34  CALCIUM  --   < > 8.9 8.5 8.9 9.0 9.5  MG 1.8  --  2.0  --   --   --  2.5  PHOS  --   --   --   --   --   --  2.2*  < > = values in this interval not displayed.   eICU Interventions  Still with wide complex  Reviewed EKG with Dr Debara Pickett of cards: who will see him  rec Amio dose x 1 now Correct low phos with K phos   Intervention Category Intermediate Interventions: Electrolyte abnormality - evaluation and management  Brand Males 01/13/2014, 7:54 PM

## 2014-01-13 NOTE — Consult Note (Signed)
WOC wound consult note Reason for Consult: Pressure ulcer to sacrum   Wound type: Pink tissue noted from healed stage II pressure ulcer to sacrum Pressure Ulcer POA: Yes Measurement: 1 cm x 1 cm intact Wound bed: Fully epithelialized.  healed Drainage (amount, consistency, odor) none Dressing procedure/placement/frequency: Allevyn silicone border foam to protect.  Turn and reposition frequently.  Will not follow at this time.  Please re-consult if needed.  Domenic Moras RN BSN [place] Pager [phone]

## 2014-01-13 NOTE — Progress Notes (Signed)
Called about stable wide-complex tachycardia. Patient was given IV lopressor and converted quickly back to a more narrow complex rhythm, but has had intermittent bursts of tachycardia. This could represent VT (especially given EF of 30-35%).  I would recommend a single dose IV amiodarone 150 mg now. Will provide further consultative recommendations in the am tomorrow.  D/w Dr. Chase Caller in [place].  Pixie Casino, MD, [facility] Attending Cardiologist [place]

## 2014-01-13 NOTE — Progress Notes (Signed)
eLink Physician-Brief Progress Note Patient Name: JEFFRY VOGELSANG DOB: 1945/11/17 MRN: [id number]  Date of Service  01/13/2014   HPI/Events of Note   Patient asymptomatic buyt suddent wide complex tachycardia to HR 140s. On EKG this looked sinus. After 5mg  lipopressor chnaged to narrow complex. The wide complex looked like solder ekg   eICU Interventions  mag sulfate 2gm Repeat lopressor - HR 127, improved but still high reepeakt EKG - now narrow and sinus Check trop, electrolytes TRH to call cardds consult   Intervention Category Major Interventions: Arrhythmia - evaluation and management  Shalee Paolo 01/13/2014, 5:28 PM

## 2014-01-13 NOTE — Progress Notes (Signed)
Patient ID: [name], male   DOB: 02-16-1946, 68 y.o.   MRN: [id number]         [facility] for Infectious [facility]    Date of Admission:  01/08/2014           Day 6 antibiotics        Day 2 levofloxacin   Principal Problem:   Sepsis Active Problems:   UTI (urinary tract infection)   Bacteremia   Dementia   Stroke   GERD (gastroesophageal reflux disease)   UGIB (upper gastrointestinal bleed)   Hemiparesis affecting right side as late effect of stroke   Expressive aphasia   Hypertension, accelerated   BPH (benign prostatic hyperplasia)   Dyslipidemia   Depression   Anemia in chronic kidney disease(285.21)   Chronic kidney disease, stage III (moderate)   Type II or unspecified type diabetes mellitus with renal manifestations, uncontrolled   Tachycardia   Cardiomyopathy: Per 2 D echo 10/22/13 EF 30-35%   Coffee ground emesis   Hypokalemia   Decubitus ulcer of right buttock, stage 2   Acute blood loss anemia   Dysphagia, unspecified(787.20)   . feeding supplement (ENSURE COMPLETE)  237 mL Oral BID BM  . ferrous sulfate  325 mg Oral BID WC  . hydrALAZINE  50 mg Oral TID  . insulin aspart  0-9 Units Subcutaneous TID WC  . levofloxacin  500 mg Oral Daily  . metoCLOPramide (REGLAN) injection  5 mg Intravenous 4 times per day  . metoprolol  10 mg Intravenous Q6H  . pantoprazole (PROTONIX) IV  40 mg Intravenous Q12H  . sodium chloride  10-40 mL Intracatheter Q12H  . sodium chloride  3 mL Intravenous Q12H  . tamsulosin  0.4 mg Oral Daily    Subjective:  He seems tp be feeling better.  Objective: Temp:  [97.2 F (36.2 C)-99.7 F (37.6 C)] 98.6 F (37 C) (04/30 1112) Pulse Rate:  [95-124] 119 (04/30 1600) Resp:  [7-27] 23 (04/30 1600) BP: (99-209)/(38-106) 103/49 mmHg (04/30 1605) SpO2:  [95 %-100 %] 100 % (04/30 1600) Weight:  [184 lb 8.4 oz (83.7 kg)] 184 lb 8.4 oz (83.7 kg) (04/30 0533)  General: He is alert, comfortable watching TV. He is talkative and  interactive  Lab Results Lab Results  Component Value Date   WBC 5.3 01/13/2014   HGB 10.2* 01/13/2014   HCT 33.3* 01/13/2014   MCV 82.0 01/13/2014   PLT 295 01/13/2014    Lab Results  Component Value Date   CREATININE 1.21 01/13/2014   BUN 5* 01/13/2014   NA 138 01/13/2014   K 4.6 01/13/2014   CL 106 01/13/2014   CO2 21 01/13/2014    Lab Results  Component Value Date   ALT 7 01/09/2014   AST 16 01/09/2014   ALKPHOS 62 01/09/2014   BILITOT 0.3 01/09/2014      Microbiology: Recent Results (from the past 240 hour(s))  URINE CULTURE     Status: None   Collection Time    01/08/14  2:50 PM      Result Value Ref Range Status   Specimen Description URINE, CATHETERIZED   Final   Special Requests NONE   Final   Culture  Setup Time     Final   Value: 01/08/2014 17:48     Performed at [facility]     Final   Value: >=100,000 COLONIES/ML     Performed at [facility]  Final   Value: KLEBSIELLA PNEUMONIAE     PROTEUS MIRABILIS     Performed at [facility]   Report Status 01/12/2014 FINAL   Final   Organism ID, Bacteria KLEBSIELLA PNEUMONIAE   Final   Organism ID, Bacteria PROTEUS MIRABILIS   Final  CULTURE, BLOOD (ROUTINE X 2)     Status: None   Collection Time    01/08/14  3:30 PM      Result Value Ref Range Status   Specimen Description BLOOD LEFT ANTECUBITAL   Final   Special Requests BOTTLES DRAWN AEROBIC AND ANAEROBIC 2CC   Final   Culture  Setup Time     Final   Value: 01/08/2014 21:32     Performed at [facility]   Culture     Final   Value: STAPHYLOCOCCUS SPECIES (COAGULASE NEGATIVE)     Note: THE SIGNIFICANCE OF ISOLATING THIS ORGANISM FROM A SINGLE VENIPUNCTURE CANNOT BE PREDICTED WITHOUT FURTHER CLINICAL AND CULTURE CORRELATION. SUSCEPTIBILITIES AVAILABLE ONLY ON REQUEST.     Note: Gram Stain Report Called to,Read Back By and Verified With: Carlyn Reichert 01/09/14 @ 5PM BY [name]     Performed at [facility]   Report Status 01/11/2014 FINAL   Final  MRSA PCR SCREENING     Status: None   Collection Time    01/08/14  8:10 PM      Result Value Ref Range Status   MRSA by PCR NEGATIVE  NEGATIVE Final   Comment:            The GeneXpert MRSA Assay (FDA     approved for NASAL specimens     only), is one component of a     comprehensive MRSA colonization     surveillance program. It is not     intended to diagnose MRSA     infection nor to guide or     monitor treatment for     MRSA infections.  URINE CULTURE     Status: None   Collection Time    01/08/14  9:38 PM      Result Value Ref Range Status   Specimen Description URINE, RANDOM   Final   Special Requests NONE   Final   Culture  Setup Time     Final   Value: 01/09/2014 03:53     Performed at [place]     Final   Value: 50,000 COLONIES/ML     Performed at [facility]   Culture     Final   Value: Multiple bacterial morphotypes present, none predominant. Suggest appropriate recollection if clinically indicated.     Performed at [facility]   Report Status 01/11/2014 FINAL   Final   Assessment: He is improving on therapy for pyelonephritis.   Plan: 1. Continue oral levofloxacin for 4 more days 2. I will sign off now please call if we can be of further assistance  Michel Bickers, MD [facility] [phone] pager   [phone] cell 01/13/2014, 4:15 PM

## 2014-01-13 NOTE — Progress Notes (Addendum)
Patient ID: [name], male   DOB: 08-16-1946, 68 y.o.   MRN: [id number]  [facility] PROGRESS NOTE  [name] SAY:[id number] DOB: 09-07-1946 DOA: 01/08/2014 PCP: No primary provider on file.  Brief narrative:  68 y.o. male, resident of SNF, with history of stroke with residual right-sided hemiparesis (on Plavix and aspirin), dementia, BPH with chronic indwelling Foley catheter, HTN, CKD, GERD, severe ulcerative esophagitis by EGD March 2014 with biopsy showing nonspecific inflammation, recently hospitalized in February of 2015 for hematemesis, now presented to the ED with main concern of coffee-ground emesis.   Upon arrival to ED, pt's SBP was in 200's with HR in 130 - 140's. UA suggestive of UTI. Pt started on Protonix drip, IV Vancomycin and Zosyn.TRH asked to admit, GI consulted.   Principal Problem:  Sepsis  - most likely secondary to UTI  - urine culture 4/25 grew klebsiella and proteus Both sensitive to Ciprofloxacin, Levaquin, Rocephin  - pt transitioned to oral Levaquin 4/29 and will continue for 4 more days, appreciate GI input  Active Problems:  Coffee ground emesis  - EGD with distal ulcerative, erosive esophagitis (4/30 - done by Dr. Penelope Coop)  - Hg/Hct remain stable and at baseline  - continue Protonix BID and transition to PO  HTN, accelerated  - continue Hydralazine, Metoprolol  - stopped Enalaprilat due to renal failure  - we can add another antihypertensive regimen if indicated  Hemiparesis affecting right side as late effect of stroke  - will place order for PT evaluation once pt more medically able to participate  BPH (benign prostatic hyperplasia)  - continue Tamsulosin  Anemia in chronic kidney disease  - Hg and Hct stable and at baseline  - CBC in AM  Chronic kidney disease, stage III (moderate)  - Cr trending down and now WNL - stop IVF as pt on soft dysphagia III diet and tolerating well - repeat BMP in AM  Type II or unspecified type  diabetes mellitus with renal manifestations, uncontrolled  - continue SSI for now until oral intake improves  Cardiomyopathy: Per 2 D echo 10/2013 EF 30-35%  - gentle hydration as noted above  - weight trend 191 lbs --> 184 lbs this AM Hypokalemia  - supplemented and WNL this AM  Decubitus ulcer of right buttock, stage 2  - wound care consult  Moderate malnutrition  - advance diet as pt able to tolerate  - advance diet to dysphagia III   Consultants:  ID  GI  Wound care  Procedures/Studies:  None Antibiotics:  Vancomycin 4/25 --> 4/28  Zosyn 4/25 --> 4/28  Maxipime 4/28 --> 4/29 Levaquin 4/29 --> 05/03  Code Status: Full  Family Communication: Brother at bedside  Disposition Plan: Transfer to telemetry bed   HPI/Subjective: No events overnight.   Objective: Filed Vitals:   01/13/14 1550 01/13/14 1555 01/13/14 1600 01/13/14 1605  BP:   99/38 103/49  Pulse: 115 117 119   Temp:      TempSrc:      Resp: 22 24 23    Height:      Weight:      SpO2: 100% 100% 100%     Intake/Output Summary (Last 24 hours) at 01/13/14 1650 Last data filed at 01/13/14 1430  Gross per 24 hour  Intake    970 ml  Output   2050 ml  Net  -1080 ml    Exam:   General:  Pt is alert, follows commands appropriately, not in acute distress  Cardiovascular: Regular rate and rhythm, S1/S2, no murmurs, no rubs, no gallops  Respiratory: Clear to auscultation bilaterally, no wheezing, no crackles, no rhonchi  Abdomen: Soft, non tender, non distended, bowel sounds present, no guarding  Data Reviewed: Basic Metabolic Panel:  Recent Labs Lab 01/08/14 2120 01/09/14 0400 01/10/14 0540 01/11/14 0540 01/12/14 0530 01/13/14 0350  NA  --  147 144 142 142 138  K  --  3.6* 3.9 4.1 4.4 4.6  CL  --  112 111 109 110 106  CO2  --  23 24 21 23 21   GLUCOSE  --  127* 139* 122* 125* 93  BUN  --  21 12 8 6  5*  CREATININE  --  1.28 1.39* 1.40* 1.37* 1.21  CALCIUM  --  8.9 8.9 8.5 8.9 9.0  MG 1.8  --   2.0  --   --   --    Liver Function Tests:  Recent Labs Lab 01/08/14 1530 01/09/14 0400  AST 15 16  ALT 8 7  ALKPHOS 76 62  BILITOT 0.2* 0.3  PROT 7.0 5.9*  ALBUMIN 3.2* 2.8*   CBC:  Recent Labs Lab 01/08/14 1530  01/09/14 0400 01/10/14 0540 01/11/14 0540 01/12/14 0530 01/13/14 0350  WBC 11.1*  < > 8.5 6.4 5.9 6.0 5.3  NEUTROABS 9.0*  --   --   --   --   --   --   HGB 13.9  < > 12.2* 9.6* 9.6* 9.6* 10.2*  HCT 42.9  < > 39.1 31.0* 30.5* 31.4* 33.3*  MCV 79.2  < > 82.1 81.8 81.1 81.8 82.0  PLT 460*  < > 339 229 238 255 295  < > = values in this interval not displayed. Cardiac Enzymes:  Recent Labs Lab 01/08/14 1530 01/08/14 2120 01/09/14 0400 01/09/14 0959  TROPONINI <0.30 <0.30 <0.30 <0.30   CBG:  Recent Labs Lab 01/12/14 1212 01/12/14 1737 01/12/14 2156 01/13/14 0823 01/13/14 1632  GLUCAP 123* 121* 96 101* 94    Recent Results (from the past 240 hour(s))  URINE CULTURE     Status: None   Collection Time    01/08/14  2:50 PM      Result Value Ref Range Status   Specimen Description URINE, CATHETERIZED   Final   Special Requests NONE   Final   Culture  Setup Time     Final   Value: 01/08/2014 17:48     Performed at [place]     Final   Value: >=100,000 COLONIES/ML     Performed at [facility]   Culture     Final   Value: [place]     Performed at [facility]   Report Status 01/12/2014 FINAL   Final   Organism ID, Bacteria KLEBSIELLA PNEUMONIAE   Final   Organism ID, Bacteria PROTEUS MIRABILIS   Final  CULTURE, BLOOD (ROUTINE X 2)     Status: None   Collection Time    01/08/14  3:30 PM      Result Value Ref Range Status   Specimen Description BLOOD LEFT ANTECUBITAL   Final   Special Requests BOTTLES DRAWN AEROBIC AND ANAEROBIC 2CC   Final   Culture  Setup Time     Final   Value: 01/08/2014 21:32     Performed at [facility]   Culture     Final    Value: STAPHYLOCOCCUS SPECIES (COAGULASE  NEGATIVE)     Note: THE SIGNIFICANCE OF ISOLATING THIS ORGANISM FROM A SINGLE VENIPUNCTURE CANNOT BE PREDICTED WITHOUT FURTHER CLINICAL AND CULTURE CORRELATION. SUSCEPTIBILITIES AVAILABLE ONLY ON REQUEST.     Note: Gram Stain Report Called to,Read Back By and Verified With: Carlyn Reichert 01/09/14 @ 5PM BY [name]     Performed at [facility]   Report Status 01/11/2014 FINAL   Final  MRSA PCR SCREENING     Status: None   Collection Time    01/08/14  8:10 PM      Result Value Ref Range Status   MRSA by PCR NEGATIVE  NEGATIVE Final   Comment:            The GeneXpert MRSA Assay (FDA     approved for NASAL specimens     only), is one component of a     comprehensive MRSA colonization     surveillance program. It is not     intended to diagnose MRSA     infection nor to guide or     monitor treatment for     MRSA infections.  URINE CULTURE     Status: None   Collection Time    01/08/14  9:38 PM      Result Value Ref Range Status   Specimen Description URINE, RANDOM   Final   Special Requests NONE   Final   Culture  Setup Time     Final   Value: 01/09/2014 03:53     Performed at [employer]     Final   Value: 50,000 COLONIES/ML     Performed at [facility]   Culture     Final   Value: Multiple bacterial morphotypes present, none predominant. Suggest appropriate recollection if clinically indicated.     Performed at [facility]   Report Status 01/11/2014 FINAL   Final     Scheduled Meds: . feeding supplement (ENSURE COMPLETE)  237 mL Oral BID BM  . ferrous sulfate  325 mg Oral BID WC  . hydrALAZINE  50 mg Oral TID  . insulin aspart  0-9 Units Subcutaneous TID WC  . levofloxacin  500 mg Oral Daily  . metoCLOPramide (REGLAN) injection  5 mg Intravenous 4 times per day  . metoprolol  10 mg Intravenous Q6H  . pantoprazole (PROTONIX) IV  40 mg Intravenous Q12H  . sodium chloride  10-40 mL  Intracatheter Q12H  . sodium chloride  3 mL Intravenous Q12H  . tamsulosin  0.4 mg Oral Daily   Continuous Infusions: . sodium chloride Stopped (01/12/14 2330)  . sodium chloride 30 mL/hr at 01/12/14 1511   Theodis Blaze, MD  [facility] Pager [phone]  If 7PM-7AM, please contact night-coverage [email or web address] Password [email or web address] 01/13/2014, 4:50 PM   LOS: 5 days

## 2014-01-14 ENCOUNTER — Encounter (HOSPITAL_COMMUNITY): Payer: Self-pay | Admitting: Gastroenterology

## 2014-01-14 DIAGNOSIS — I472 Ventricular tachycardia: Secondary | ICD-10-CM | POA: Diagnosis present

## 2014-01-14 DIAGNOSIS — I428 Other cardiomyopathies: Secondary | ICD-10-CM

## 2014-01-14 DIAGNOSIS — I4729 Other ventricular tachycardia: Secondary | ICD-10-CM

## 2014-01-14 LAB — GLUCOSE, CAPILLARY
GLUCOSE-CAPILLARY: 160 mg/dL — AB (ref 70–99)
GLUCOSE-CAPILLARY: 85 mg/dL (ref 70–99)
Glucose-Capillary: 113 mg/dL — ABNORMAL HIGH (ref 70–99)
Glucose-Capillary: 145 mg/dL — ABNORMAL HIGH (ref 70–99)

## 2014-01-14 LAB — BASIC METABOLIC PANEL
BUN: 6 mg/dL (ref 6–23)
CHLORIDE: 105 meq/L (ref 96–112)
CO2: 22 meq/L (ref 19–32)
CREATININE: 1.33 mg/dL (ref 0.50–1.35)
Calcium: 9.2 mg/dL (ref 8.4–10.5)
GFR calc Af Amer: 62 mL/min — ABNORMAL LOW (ref >90)
GFR calc non Af Amer: 53 mL/min — ABNORMAL LOW (ref >90)
GLUCOSE: 108 mg/dL — AB (ref 70–99)
Potassium: 4 mEq/L (ref 3.7–5.3)
SODIUM: 138 meq/L (ref 137–147)

## 2014-01-14 LAB — TROPONIN I
Troponin I: <0.3 ng/mL (ref <0.30)
Troponin I: <0.3 ng/mL (ref <0.30)
Troponin I: <0.3 ng/mL (ref <0.30)

## 2014-01-14 LAB — CBC
HCT: 32 % — ABNORMAL LOW (ref 39.0–52.0)
Hemoglobin: 10.4 g/dL — ABNORMAL LOW (ref 13.0–17.0)
MCH: 25.7 pg — ABNORMAL LOW (ref 26.0–34.0)
MCHC: 32.5 g/dL (ref 30.0–36.0)
MCV: 79 fL (ref 78.0–100.0)
PLATELETS: 292 10*3/uL (ref 150–400)
RBC: 4.05 MIL/uL — AB (ref 4.22–5.81)
RDW: 15.6 % — ABNORMAL HIGH (ref 11.5–15.5)
WBC: 6.5 10*3/uL (ref 4.0–10.5)

## 2014-01-14 MED ORDER — HYDRALAZINE HCL 10 MG PO TABS
10.0000 mg | ORAL_TABLET | Freq: Three times a day (TID) | ORAL | Status: DC
  Administered 2014-01-14 – 2014-01-19 (×14): 10 mg via ORAL
  Filled 2014-01-14 (×18): qty 1

## 2014-01-14 MED ORDER — ISOSORB DINITRATE-HYDRALAZINE 20-37.5 MG PO TABS
1.0000 | ORAL_TABLET | Freq: Two times a day (BID) | ORAL | Status: DC
  Administered 2014-01-14 – 2014-01-19 (×10): 1 via ORAL
  Filled 2014-01-14 (×12): qty 1

## 2014-01-14 NOTE — Progress Notes (Signed)
Patient ID: [name], male   DOB: October 01, 1945, 68 y.o.   MRN: [id number]  [facility] PROGRESS NOTE  PAVLE WILER VOJ:[id number] DOB: 11/04/1945 DOA: 01/08/2014 PCP: No primary provider on file.  Brief narrative:  68 y.o. male, resident of SNF, with history of stroke with residual right-sided hemiparesis (on Plavix and aspirin), dementia, BPH with chronic indwelling Foley catheter, HTN, CKD, GERD, severe ulcerative esophagitis by EGD March 2014 with biopsy showing nonspecific inflammation, recently hospitalized in February of 2015 for hematemesis, now presented to the ED with main concern of coffee-ground emesis.   Upon arrival to ED, pt's SBP was in 200's with HR in 130 - 140's. UA suggestive of UTI. Pt started on Protonix drip, IV Vancomycin and Zosyn.TRH asked to admit, GI consulted.   Principal Problem:  Sepsis  - most likely secondary to UTI  - urine culture 4/25 grew klebsiella and proteus Both sensitive to Ciprofloxacin, Levaquin, Rocephin  - pt transitioned to oral Levaquin 4/29 and will continue for 3 more days, appreciate GI input  Active Problems:  Coffee ground emesis  - EGD with distal ulcerative, erosive esophagitis (4/30 - done by Dr. Penelope Coop)  - Hg/Hct remain stable and at baseline  - continue Protonix BID and transition to PO  NSVT/Sustained VT - with LBBB - Cardiomyopathy, probably non-ischemic, EF 30-35%  - management per cardiology, appreciate input  HTN, accelerated  - continue Hydralazine, Metoprolol  - stopped Enalaprilat due to renal failure  Hemiparesis affecting right side as late effect of stroke  - will place order for PT evaluation once pt more medically able to participate  BPH (benign prostatic hyperplasia)  - continue Tamsulosin  Anemia in chronic kidney disease  - Hg and Hct stable and at baseline  - CBC in AM  Chronic kidney disease, stage III (moderate)  - Cr trending down and now WNL  - stop IVF as pt on soft dysphagia III diet  and tolerating well  - repeat BMP in AM  Type II or unspecified type diabetes mellitus with renal manifestations, uncontrolled  - continue SSI for now until oral intake improves  Cardiomyopathy: Per 2 D echo 10/2013 EF 30-35%  - gentle hydration as noted above  - weight trend 191 lbs --> 184 lbs --> 189 lbs this AM  Hypokalemia  - supplemented and WNL this AM  Decubitus ulcer of right buttock, stage 2  - wound care consult  Moderate malnutrition  - advance diet as pt able to tolerate  - advance diet to dysphagia III   Consultants:  ID  GI  Cardiology  Wound care  Procedures/Studies:  None Antibiotics:  Vancomycin 4/25 --> 4/28  Zosyn 4/25 --> 4/28  Maxipime 4/28 --> 4/29  Levaquin 4/29 --> 05/03  Code Status: Full  Family Communication: Brother at bedside  Disposition Plan: Transfer to telemetry bed    HPI/Subjective: No events overnight.   Objective: Filed Vitals:   01/14/14 0500 01/14/14 0600 01/14/14 0800 01/14/14 0850  BP: 151/81 177/91  197/88  Pulse: 120 101    Temp:   98.5 F (36.9 C)   TempSrc:   Oral   Resp: 23 21    Height:      Weight: 85.9 kg (189 lb 6 oz)     SpO2: 98% 100%      Intake/Output Summary (Last 24 hours) at 01/14/14 0921 Last data filed at 01/14/14 0600  Gross per 24 hour  Intake    820 ml  Output  2110 ml  Net  -1290 ml    Exam:   General:  Pt is alert, follows most of the commands appropriately, not in acute distress  Cardiovascular: Regular rate and rhythm, no rubs, no gallops  Respiratory: Clear to auscultation bilaterally, no wheezing, diminished breath sounds at bases   Abdomen: Soft, non tender, non distended, bowel sounds present, no guarding  Data Reviewed: Basic Metabolic Panel:  Recent Labs Lab 01/08/14 2120  01/10/14 0540 01/11/14 0540 01/12/14 0530 01/13/14 0350 01/13/14 1823 01/14/14 0530  NA  --   < > 144 142 142 138 143 138  K  --   < > 3.9 4.1 4.4 4.6 4.4 4.0  CL  --   < > 111 109 110 106 109  105  CO2  --   < > 24 21 23 21 22 22   GLUCOSE  --   < > 139* 122* 125* 93 154* 108*  BUN  --   < > 12 8 6  5* 5* 6  CREATININE  --   < > 1.39* 1.40* 1.37* 1.21 1.34 1.33  CALCIUM  --   < > 8.9 8.5 8.9 9.0 9.5 9.2  MG 1.8  --  2.0  --   --   --  2.5  --   PHOS  --   --   --   --   --   --  2.2*  --   < > = values in this interval not displayed. Liver Function Tests:  Recent Labs Lab 01/08/14 1530 01/09/14 0400  AST 15 16  ALT 8 7  ALKPHOS 76 62  BILITOT 0.2* 0.3  PROT 7.0 5.9*  ALBUMIN 3.2* 2.8*   CBC:  Recent Labs Lab 01/08/14 1530  01/10/14 0540 01/11/14 0540 01/12/14 0530 01/13/14 0350 01/14/14 0530  WBC 11.1*  < > 6.4 5.9 6.0 5.3 6.5  NEUTROABS 9.0*  --   --   --   --   --   --   HGB 13.9  < > 9.6* 9.6* 9.6* 10.2* 10.4*  HCT 42.9  < > 31.0* 30.5* 31.4* 33.3* 32.0*  MCV 79.2  < > 81.8 81.1 81.8 82.0 79.0  PLT 460*  < > 229 238 255 295 292  < > = values in this interval not displayed. Cardiac Enzymes:  Recent Labs Lab 01/08/14 2120 01/09/14 0400 01/09/14 0959 01/13/14 1823 01/14/14 0200  TROPONINI <0.30 <0.30 <0.30 <0.30 <0.30   CBG:  Recent Labs Lab 01/12/14 2156 01/13/14 0823 01/13/14 1632 01/13/14 1958 01/14/14 0743  GLUCAP 96 101* 94 152* 113*    Recent Results (from the past 240 hour(s))  URINE CULTURE     Status: None   Collection Time    01/08/14  2:50 PM      Result Value Ref Range Status   Specimen Description URINE, CATHETERIZED   Final   Special Requests NONE   Final   Culture  Setup Time     Final   Value: 01/08/2014 17:48     Performed at [place]     Final   Value: >=100,000 COLONIES/ML     Performed at [facility]   Culture     Final   Value: [place]     Performed at [facility]   Report Status 01/12/2014 FINAL   Final   Organism ID, Bacteria KLEBSIELLA PNEUMONIAE   Final   Organism ID,  Bacteria PROTEUS MIRABILIS   Final  CULTURE, BLOOD  (ROUTINE X 2)     Status: None   Collection Time    01/08/14  3:30 PM      Result Value Ref Range Status   Specimen Description BLOOD LEFT ANTECUBITAL   Final   Special Requests BOTTLES DRAWN AEROBIC AND ANAEROBIC 2CC   Final   Culture  Setup Time     Final   Value: 01/08/2014 21:32     Performed at [facility]   Culture     Final   Value: STAPHYLOCOCCUS SPECIES (COAGULASE NEGATIVE)     Note: THE SIGNIFICANCE OF ISOLATING THIS ORGANISM FROM A SINGLE VENIPUNCTURE CANNOT BE PREDICTED WITHOUT FURTHER CLINICAL AND CULTURE CORRELATION. SUSCEPTIBILITIES AVAILABLE ONLY ON REQUEST.     Note: Gram Stain Report Called to,Read Back By and Verified With: Carlyn Reichert 01/09/14 @ 5PM BY [name]     Performed at [facility]   Report Status 01/11/2014 FINAL   Final  MRSA PCR SCREENING     Status: None   Collection Time    01/08/14  8:10 PM      Result Value Ref Range Status   MRSA by PCR NEGATIVE  NEGATIVE Final   Comment:            The GeneXpert MRSA Assay (FDA     approved for NASAL specimens     only), is one component of a     comprehensive MRSA colonization     surveillance program. It is not     intended to diagnose MRSA     infection nor to guide or     monitor treatment for     MRSA infections.  URINE CULTURE     Status: None   Collection Time    01/08/14  9:38 PM      Result Value Ref Range Status   Specimen Description URINE, RANDOM   Final   Special Requests NONE   Final   Culture  Setup Time     Final   Value: 01/09/2014 03:53     Performed at [place]     Final   Value: 50,000 COLONIES/ML     Performed at [facility]   Culture     Final   Value: Multiple bacterial morphotypes present, none predominant. Suggest appropriate recollection if clinically indicated.     Performed at [facility]   Report Status 01/11/2014 FINAL   Final     Scheduled Meds: . feeding supplement (ENSURE COMPLETE)  237 mL Oral BID BM   . ferrous sulfate  325 mg Oral BID WC  . insulin aspart  0-9 Units Subcutaneous TID WC  . levofloxacin  500 mg Oral Daily  . metoCLOPramide (REGLAN) injection  5 mg Intravenous 4 times per day  . metoprolol  10 mg Intravenous Q6H  . metoprolol  5 mg Intravenous STAT  . metoprolol  5 mg Intravenous STAT  . pantoprazole (PROTONIX) IV  40 mg Intravenous Q12H  . sodium chloride  10-40 mL Intracatheter Q12H  . sodium chloride  3 mL Intravenous Q12H  . tamsulosin  0.4 mg Oral Daily   Continuous Infusions:    Theodis Blaze, MD  [facility] Pager [phone]  If 7PM-7AM, please contact night-coverage [email or web address] Password [email or web address] 01/14/2014, 9:21 AM   LOS: 6 days

## 2014-01-14 NOTE — Progress Notes (Signed)
PT Cancellation Note / Discharge from Acute PT  Patient Details Name: SULTAN PARGAS MRN: [id number] DOB: 06/25/1946   Cancelled Treatment:    Reason Eval/Treat Not Completed: Other (comment)  [facility] to clarify PLOF and they report pt unable to assist very much at baseline, lifted by CNAs.  Pt was receiving therapy in March however was discharged.  Pt does not seem appropriate for skilled acute PT at this time.  Will defer further PT needs back to SNF.  Recommend lift if pt OOB.  Pt to sign off.   Junius Argyle 01/14/2014, 1:04 PM Carmelia Bake, PT, DPT 01/14/2014 Pager: [phone]

## 2014-01-14 NOTE — Progress Notes (Signed)
CSW continuing to follow as pt admitted from [facility].  CSW spoke with MD who stated that pt not yet medically ready for discharge and no anticipation that pt will be medically ready during the weekend.  CSW contacted [facility] and left a message.  CSW to continue to follow and facilitate pt discharge needs when pt medically ready for discharge.   Alison Murray, MSW, [place] [facility] [phone]

## 2014-01-14 NOTE — Anesthesia Postprocedure Evaluation (Signed)
Anesthesia Post Note  Patient: [name]  Procedure(s) Performed: Procedure(s) (LRB): ESOPHAGOGASTRODUODENOSCOPY (EGD) (N/A)  Anesthesia type: General  Patient location: PACU  Post pain: Pain level controlled  Post assessment: Post-op Vital signs reviewed  Last Vitals:  Filed Vitals:   01/14/14 0800  BP:   Pulse:   Temp: 36.9 C  Resp:     Post vital signs: Reviewed  Level of consciousness: sedated  Complications: No apparent anesthesia complications

## 2014-01-14 NOTE — Consult Note (Signed)
CONSULTATION NOTE  Reason for Consult: Sustained VT  Requesting Physician: Dr. Doyle Askew  Cardiologist: Dr. Marlou Porch  HPI: This is a 68 y.o. male with a past medical history significant for acute GI bleed in 10/2012 - found by echo to have a moderate pericardial effusion.  Also a history of hypertension, dementia, GERD, and stroke with right-sided hemiparesis as well as expressive aphasia.  He had an abdominal CT which showed cardiac enlargement with pericardial effusion. An echocardiogram was performed which demonstrated a large pericardial effusion with possible inhibition of his right atrial free wall with no evidence of right ventricular collapse, unable to visualize IVC. This was ultimately drained by pericardial window on 11/10/12. EF was noted to be newly reduced to 40% range on echocardiogram. He was noted to have a LBBB. He was noted to have some NSVT during his prior hospitalization.  He is now admitted with recurrent hematemesis and coffee ground emesis. He was also found to have UTI and positive blood cultures.  During hospitalization, noted to have sustained VT in the 140's which broke with IV lopressor - he has had some PVC's and bigeminy overnight.  PMHx:  Past Medical History  Diagnosis Date  . Dementia   . Stroke   . Hydronephrosis   . Hypertension   . Hyperlipemia   . GERD (gastroesophageal reflux disease)   . BPH (benign prostatic hyperplasia)   . Cardiomyopathy: Per 2 D echo 10/22/13 EF 30-35% 01/08/2014   Past Surgical History  Procedure Laterality Date  . Esophagogastroduodenoscopy N/A 11/11/2012    Procedure: ESOPHAGOGASTRODUODENOSCOPY (EGD);  Surgeon: Lear Ng, MD;  Location: [facility] ENDOSCOPY;  Service: Endoscopy;  Laterality: N/A;  . Subxyphoid pericardial window N/A 11/13/2012    Procedure: SUBXYPHOID PERICARDIAL WINDOW;  Surgeon: Grace Isaac, MD;  Location: [place];  Service: Thoracic;  Laterality: N/A;  . Esophagogastroduodenoscopy N/A 11/25/2012   Procedure: ESOPHAGOGASTRODUODENOSCOPY (EGD);  Surgeon: Winfield Cunas., MD;  Location: [facility] ENDOSCOPY;  Service: Endoscopy;  Laterality: N/A;    FAMHx: History reviewed. No pertinent family history.  SOCHx:  reports that he has never smoked. He has never used smokeless tobacco. He reports that he does not drink alcohol or use illicit drugs.  ALLERGIES: No Known Allergies  ROS: Review of systems not obtained due to patient factors. (Expressive aphasia)  HOME MEDICATIONS: Prescriptions prior to admission  Medication Sig Dispense Refill  . amLODipine (NORVASC) 10 MG tablet Take 10 mg by mouth daily.      Marland Kitchen aspirin 81 MG tablet Take 81 mg by mouth daily.      Marland Kitchen atorvastatin (LIPITOR) 20 MG tablet Take 1 tablet (20 mg total) by mouth daily at 6 PM.  30 tablet  0  . cloNIDine (CATAPRES) 0.2 MG tablet Take 0.2 mg by mouth 2 (two) times daily.      . clopidogrel (PLAVIX) 75 MG tablet Take 75 mg by mouth daily with breakfast.      . docusate sodium (COLACE) 100 MG capsule Take 100 mg by mouth 2 (two) times daily.      . ferrous sulfate 325 (65 FE) MG tablet Take 325 mg by mouth 2 (two) times daily with a meal.      . hydrALAZINE (APRESOLINE) 50 MG tablet Take 50 mg by mouth 3 (three) times daily.      Marland Kitchen lactulose (CHRONULAC) 10 GM/15ML solution Take 30 mLs by mouth 2 (two) times daily.      . memantine (NAMENDA) 10 MG tablet  Take 10 mg by mouth 2 (two) times daily.      . metoCLOPramide (REGLAN) 10 MG tablet Take 1 tablet (10 mg total) by mouth 4 (four) times daily -  before meals and at bedtime.      . metoprolol succinate (TOPROL-XL) 200 MG 24 hr tablet Take 1 tablet (200 mg total) by mouth daily. Take with or immediately following a meal.  30 tablet  0  . pantoprazole (PROTONIX) 40 MG tablet Take 1 tablet (40 mg total) by mouth 2 (two) times daily before a meal.      . polyethylene glycol (MIRALAX / GLYCOLAX) packet Take 17 g by mouth daily.  14 each  0  . Polyvinyl Alcohol-Povidone  (FRESHKOTE OP) Place 1 drop into both eyes 4 (four) times daily.      . tamsulosin (FLOMAX) 0.4 MG CAPS Take 0.4 mg by mouth daily.      Marland Kitchen venlafaxine XR (EFFEXOR-XR) 75 MG 24 hr capsule Take 75 mg by mouth daily with breakfast.        HOSPITAL MEDICATIONS: Prior to Admission:  Prescriptions prior to admission  Medication Sig Dispense Refill  . amLODipine (NORVASC) 10 MG tablet Take 10 mg by mouth daily.      Marland Kitchen aspirin 81 MG tablet Take 81 mg by mouth daily.      Marland Kitchen atorvastatin (LIPITOR) 20 MG tablet Take 1 tablet (20 mg total) by mouth daily at 6 PM.  30 tablet  0  . cloNIDine (CATAPRES) 0.2 MG tablet Take 0.2 mg by mouth 2 (two) times daily.      . clopidogrel (PLAVIX) 75 MG tablet Take 75 mg by mouth daily with breakfast.      . docusate sodium (COLACE) 100 MG capsule Take 100 mg by mouth 2 (two) times daily.      . ferrous sulfate 325 (65 FE) MG tablet Take 325 mg by mouth 2 (two) times daily with a meal.      . hydrALAZINE (APRESOLINE) 50 MG tablet Take 50 mg by mouth 3 (three) times daily.      Marland Kitchen lactulose (CHRONULAC) 10 GM/15ML solution Take 30 mLs by mouth 2 (two) times daily.      . memantine (NAMENDA) 10 MG tablet Take 10 mg by mouth 2 (two) times daily.      . metoCLOPramide (REGLAN) 10 MG tablet Take 1 tablet (10 mg total) by mouth 4 (four) times daily -  before meals and at bedtime.      . metoprolol succinate (TOPROL-XL) 200 MG 24 hr tablet Take 1 tablet (200 mg total) by mouth daily. Take with or immediately following a meal.  30 tablet  0  . pantoprazole (PROTONIX) 40 MG tablet Take 1 tablet (40 mg total) by mouth 2 (two) times daily before a meal.      . polyethylene glycol (MIRALAX / GLYCOLAX) packet Take 17 g by mouth daily.  14 each  0  . Polyvinyl Alcohol-Povidone (FRESHKOTE OP) Place 1 drop into both eyes 4 (four) times daily.      . tamsulosin (FLOMAX) 0.4 MG CAPS Take 0.4 mg by mouth daily.      Marland Kitchen venlafaxine XR (EFFEXOR-XR) 75 MG 24 hr capsule Take 75 mg by mouth  daily with breakfast.        VITALS: Blood pressure 177/91, pulse 101, temperature 99.1 F (37.3 C), temperature source Oral, resp. rate 21, height 6' 0.05" (1.83 m), weight 189 lb 6 oz (85.9 kg), SpO2 100.00%.  PHYSICAL EXAM: General appearance: alert, appears older than stated age and no distress Neck: no carotid bruit and no JVD Lungs: clear to auscultation bilaterally Heart: regular rhythm with occasional skipped beats Abdomen: soft, non-tender; bowel sounds normal; no masses,  no organomegaly Extremities: extremities normal, atraumatic, no cyanosis or edema Pulses: 2+ and symmetric Skin: Skin color, texture, turgor normal. No rashes or lesions Neurologic: Mental status: Awake, espressive aphasia, dense hemiparesis on the right Psych: Unable to assess  LABS: Results for orders placed during the hospital encounter of 01/08/14 (from the past 48 hour(s))  GLUCOSE, CAPILLARY     Status: Abnormal   Collection Time    01/12/14  7:50 AM      Result Value Ref Range   Glucose-Capillary 122 (*) 70 - 99 mg/dL   Comment 1 Documented in Chart     Comment 2 Notify RN    GLUCOSE, CAPILLARY     Status: Abnormal   Collection Time    01/12/14 12:12 PM      Result Value Ref Range   Glucose-Capillary 123 (*) 70 - 99 mg/dL   Comment 1 Notify RN     Comment 2 Documented in Chart    GLUCOSE, CAPILLARY     Status: Abnormal   Collection Time    01/12/14  5:37 PM      Result Value Ref Range   Glucose-Capillary 121 (*) 70 - 99 mg/dL  GLUCOSE, CAPILLARY     Status: None   Collection Time    01/12/14  9:56 PM      Result Value Ref Range   Glucose-Capillary 96  70 - 99 mg/dL  CBC     Status: Abnormal   Collection Time    01/13/14  3:50 AM      Result Value Ref Range   WBC 5.3  4.0 - 10.5 K/uL   RBC 4.06 (*) 4.22 - 5.81 MIL/uL   Hemoglobin 10.2 (*) 13.0 - 17.0 g/dL   HCT 33.3 (*) 39.0 - 52.0 %   MCV 82.0  78.0 - 100.0 fL   MCH 25.1 (*) 26.0 - 34.0 pg   MCHC 30.6  30.0 - 36.0 g/dL   RDW  15.9 (*) 11.5 - 15.5 %   Platelets 295  150 - 400 K/uL  BASIC METABOLIC PANEL     Status: Abnormal   Collection Time    01/13/14  3:50 AM      Result Value Ref Range   Sodium 138  137 - 147 mEq/L   Potassium 4.6  3.7 - 5.3 mEq/L   Chloride 106  96 - 112 mEq/L   CO2 21  19 - 32 mEq/L   Glucose, Bld 93  70 - 99 mg/dL   BUN 5 (*) 6 - 23 mg/dL   Creatinine, Ser 1.21  0.50 - 1.35 mg/dL   Calcium 9.0  8.4 - 10.5 mg/dL   GFR calc non Af Amer 60 (*) >90 mL/min   GFR calc Af Amer 69 (*) >90 mL/min   Comment: (NOTE)     The eGFR has been calculated using the CKD EPI equation.     This calculation has not been validated in all clinical situations.     eGFR's persistently <90 mL/min signify possible Chronic Kidney     Disease.  GLUCOSE, CAPILLARY     Status: Abnormal   Collection Time    01/13/14  8:23 AM      Result Value Ref Range   Glucose-Capillary 101 (*)  70 - 99 mg/dL   Comment 1 Documented in Chart     Comment 2 Notify RN    GLUCOSE, CAPILLARY     Status: None   Collection Time    01/13/14  4:32 PM      Result Value Ref Range   Glucose-Capillary 94  70 - 99 mg/dL   Comment 1 Documented in Chart     Comment 2 Notify RN    BASIC METABOLIC PANEL     Status: Abnormal   Collection Time    01/13/14  6:23 PM      Result Value Ref Range   Sodium 143  137 - 147 mEq/L   Potassium 4.4  3.7 - 5.3 mEq/L   Chloride 109  96 - 112 mEq/L   CO2 22  19 - 32 mEq/L   Glucose, Bld 154 (*) 70 - 99 mg/dL   BUN 5 (*) 6 - 23 mg/dL   Creatinine, Ser 1.34  0.50 - 1.35 mg/dL   Calcium 9.5  8.4 - 10.5 mg/dL   GFR calc non Af Amer 53 (*) >90 mL/min   GFR calc Af Amer 61 (*) >90 mL/min   Comment: (NOTE)     The eGFR has been calculated using the CKD EPI equation.     This calculation has not been validated in all clinical situations.     eGFR's persistently <90 mL/min signify possible Chronic Kidney     Disease.  MAGNESIUM     Status: None   Collection Time    01/13/14  6:23 PM      Result Value  Ref Range   Magnesium 2.5  1.5 - 2.5 mg/dL  PHOSPHORUS     Status: Abnormal   Collection Time    01/13/14  6:23 PM      Result Value Ref Range   Phosphorus 2.2 (*) 2.3 - 4.6 mg/dL  TROPONIN I     Status: None   Collection Time    01/13/14  6:23 PM      Result Value Ref Range   Troponin I <0.30  <0.30 ng/mL   Comment:            Due to the release kinetics of cTnI,     a negative result within the first hours     of the onset of symptoms does not rule out     myocardial infarction with certainty.     If myocardial infarction is still suspected,     repeat the test at appropriate intervals.  GLUCOSE, CAPILLARY     Status: Abnormal   Collection Time    01/13/14  7:58 PM      Result Value Ref Range   Glucose-Capillary 152 (*) 70 - 99 mg/dL  TROPONIN I     Status: None   Collection Time    01/14/14  2:00 AM      Result Value Ref Range   Troponin I <0.30  <0.30 ng/mL   Comment:            Due to the release kinetics of cTnI,     a negative result within the first hours     of the onset of symptoms does not rule out     myocardial infarction with certainty.     If myocardial infarction is still suspected,     repeat the test at appropriate intervals.  CBC     Status: Abnormal   Collection Time    01/14/14  5:30 AM      Result Value Ref Range   WBC 6.5  4.0 - 10.5 K/uL   RBC 4.05 (*) 4.22 - 5.81 MIL/uL   Hemoglobin 10.4 (*) 13.0 - 17.0 g/dL   HCT 32.0 (*) 39.0 - 52.0 %   MCV 79.0  78.0 - 100.0 fL   MCH 25.7 (*) 26.0 - 34.0 pg   MCHC 32.5  30.0 - 36.0 g/dL   RDW 15.6 (*) 11.5 - 15.5 %   Platelets 292  150 - 400 K/uL  BASIC METABOLIC PANEL     Status: Abnormal   Collection Time    01/14/14  5:30 AM      Result Value Ref Range   Sodium 138  137 - 147 mEq/L   Potassium 4.0  3.7 - 5.3 mEq/L   Chloride 105  96 - 112 mEq/L   CO2 22  19 - 32 mEq/L   Glucose, Bld 108 (*) 70 - 99 mg/dL   BUN 6  6 - 23 mg/dL   Creatinine, Ser 1.33  0.50 - 1.35 mg/dL   Calcium 9.2  8.4 - 10.5  mg/dL   GFR calc non Af Amer 53 (*) >90 mL/min   GFR calc Af Amer 62 (*) >90 mL/min   Comment: (NOTE)     The eGFR has been calculated using the CKD EPI equation.     This calculation has not been validated in all clinical situations.     eGFR's persistently <90 mL/min signify possible Chronic Kidney     Disease.    IMAGING: No results found.  HOSPITAL DIAGNOSES: Principal Problem:   Sepsis Active Problems:   Dementia   Stroke   GERD (gastroesophageal reflux disease)   UGIB (upper gastrointestinal bleed)   Hemiparesis affecting right side as late effect of stroke   Expressive aphasia   Hypertension, accelerated   BPH (benign prostatic hyperplasia)   Dyslipidemia   Depression   Anemia in chronic kidney disease(285.21)   Chronic kidney disease, stage III (moderate)   Type II or unspecified type diabetes mellitus with renal manifestations, uncontrolled   Tachycardia   Cardiomyopathy: Per 2 D echo 10/22/13 EF 30-35%   Coffee ground emesis   Hypokalemia   Decubitus ulcer of right buttock, stage 2   UTI (urinary tract infection)   Bacteremia   Acute blood loss anemia   Dysphagia, unspecified(787.20)   IMPRESSION: 1. NSVT/Sustained VT 2. LBBB 3. Cardiomyopathy, probably non-ischemic, EF 30-35% 4.   Uncontrolled HTN 5.   Poor functional status 6.   Recurrent hemetemesis (on plavix for prior stroke) 7.   History of pericardial effusion s/p pericardial window (resolved on echo 10/2013)  RECOMMENDATION: 1. Mr. Kocak had sustained VT with a LBBB morphology, that was different and wider than his underlying morphology. Given axis and low EF would favor VT. He is having PVC's today with similar morphology. Unfortunately, his functional status is poor - there is dementia, aphasia, and hemiparesis. He is not a candidate for further ischemia evaluation as he is not a cardiac catheterization candidate. He is at increased risk for sudden cardiac death due to his cardiomyopathy and I  explained this too him, however, I do not feel he is an AICD candidate.  I would recommend medical therapy.  He is on high dose b-blocker at home and this is being held because of his UGIB.  Once he can reliably take po, I would recommend restarting his home PO Toprol XL.  In addition, if  BP will tolerate, would add BIDIL 25/37.5 mg BID for CHF. Keep K+>4 and Mg 2+>2.  Will need follow-up with Dr. Marlou Porch after discharge.  Time Spent Directly with Patient: 45 minutes  Pixie Casino, MD, [facility] Attending Cardiologist [place] 01/14/2014, 6:58 AM

## 2014-01-15 DIAGNOSIS — I6350 Cerebral infarction due to unspecified occlusion or stenosis of unspecified cerebral artery: Secondary | ICD-10-CM

## 2014-01-15 DIAGNOSIS — N039 Chronic nephritic syndrome with unspecified morphologic changes: Secondary | ICD-10-CM

## 2014-01-15 DIAGNOSIS — I319 Disease of pericardium, unspecified: Secondary | ICD-10-CM

## 2014-01-15 DIAGNOSIS — I519 Heart disease, unspecified: Secondary | ICD-10-CM

## 2014-01-15 DIAGNOSIS — D631 Anemia in chronic kidney disease: Secondary | ICD-10-CM

## 2014-01-15 LAB — GLUCOSE, CAPILLARY
GLUCOSE-CAPILLARY: 119 mg/dL — AB (ref 70–99)
Glucose-Capillary: 124 mg/dL — ABNORMAL HIGH (ref 70–99)
Glucose-Capillary: 128 mg/dL — ABNORMAL HIGH (ref 70–99)
Glucose-Capillary: 94 mg/dL (ref 70–99)
Glucose-Capillary: 99 mg/dL (ref 70–99)

## 2014-01-15 LAB — BASIC METABOLIC PANEL
BUN: 9 mg/dL (ref 6–23)
CALCIUM: 8.9 mg/dL (ref 8.4–10.5)
CO2: 24 mEq/L (ref 19–32)
Chloride: 110 mEq/L (ref 96–112)
Creatinine, Ser: 1.56 mg/dL — ABNORMAL HIGH (ref 0.50–1.35)
GFR, EST AFRICAN AMERICAN: 51 mL/min — AB (ref >90)
GFR, EST NON AFRICAN AMERICAN: 44 mL/min — AB (ref >90)
Glucose, Bld: 104 mg/dL — ABNORMAL HIGH (ref 70–99)
POTASSIUM: 4.2 meq/L (ref 3.7–5.3)
Sodium: 144 mEq/L (ref 137–147)

## 2014-01-15 LAB — CBC
HCT: 28.8 % — ABNORMAL LOW (ref 39.0–52.0)
HEMOGLOBIN: 9.2 g/dL — AB (ref 13.0–17.0)
MCH: 25.3 pg — ABNORMAL LOW (ref 26.0–34.0)
MCHC: 31.9 g/dL (ref 30.0–36.0)
MCV: 79.3 fL (ref 78.0–100.0)
Platelets: 291 10*3/uL (ref 150–400)
RBC: 3.63 MIL/uL — AB (ref 4.22–5.81)
RDW: 15.9 % — ABNORMAL HIGH (ref 11.5–15.5)
WBC: 5.4 10*3/uL (ref 4.0–10.5)

## 2014-01-15 LAB — TROPONIN I

## 2014-01-15 MED ORDER — METOPROLOL TARTRATE 1 MG/ML IV SOLN
2.5000 mg | Freq: Once | INTRAVENOUS | Status: AC
  Administered 2014-01-15: 2.5 mg via INTRAVENOUS

## 2014-01-15 MED ORDER — SODIUM CHLORIDE 0.9 % IV BOLUS (SEPSIS)
500.0000 mL | Freq: Once | INTRAVENOUS | Status: AC
  Administered 2014-01-15: 500 mL via INTRAVENOUS

## 2014-01-15 MED ORDER — METOPROLOL TARTRATE 100 MG PO TABS
100.0000 mg | ORAL_TABLET | Freq: Two times a day (BID) | ORAL | Status: DC
  Administered 2014-01-15 – 2014-01-20 (×11): 100 mg via ORAL
  Filled 2014-01-15 (×13): qty 1

## 2014-01-15 NOTE — Progress Notes (Signed)
Patient Name: [name] Date of Encounter: 01/15/2014  Principal Problem:   Sepsis Active Problems:   Dementia   Stroke   GERD (gastroesophageal reflux disease)   UGIB (upper gastrointestinal bleed)   Hemiparesis affecting right side as late effect of stroke   Expressive aphasia   Hypertension, accelerated   BPH (benign prostatic hyperplasia)   Dyslipidemia   Depression   Anemia in chronic kidney disease(285.21)   Chronic kidney disease, stage III (moderate)   Type II or unspecified type diabetes mellitus with renal manifestations, uncontrolled   Tachycardia   Cardiomyopathy: Per 2 D echo 10/22/13 EF 30-35%   Coffee ground emesis   Hypokalemia   Decubitus ulcer of right buttock, stage 2   UTI (urinary tract infection)   Bacteremia   Acute blood loss anemia   Dysphagia, unspecified(787.20)   NSVT (nonsustained ventricular tachycardia)   Length of Stay: 7  SUBJECTIVE  Unable to communicate - appears comfortable. 30 beat wide complex tachycardia at 0615h, without change in clinical status.  CURRENT MEDS . feeding supplement (ENSURE COMPLETE)  237 mL Oral BID BM  . ferrous sulfate  325 mg Oral BID WC  . hydrALAZINE  10 mg Oral 3 times per day  . insulin aspart  0-9 Units Subcutaneous TID WC  . isosorbide-hydrALAZINE  1 tablet Oral BID  . levofloxacin  500 mg Oral Daily  . metoCLOPramide (REGLAN) injection  5 mg Intravenous 4 times per day  . metoprolol  10 mg Intravenous Q6H  . pantoprazole (PROTONIX) IV  40 mg Intravenous Q12H  . sodium chloride  10-40 mL Intracatheter Q12H  . sodium chloride  3 mL Intravenous Q12H  . tamsulosin  0.4 mg Oral Daily    OBJECTIVE   Intake/Output Summary (Last 24 hours) at 01/15/14 0910 Last data filed at 01/15/14 0820  Gross per 24 hour  Intake    100 ml  Output    545 ml  Net   -445 ml   Filed Weights   01/13/14 0533 01/14/14 0500 01/15/14 0626  Weight: 184 lb 8.4 oz (83.7 kg) 189 lb 6 oz (85.9 kg) 194 lb 10.7 oz (88.3  kg)    PHYSICAL EXAM Filed Vitals:   01/15/14 0500 01/15/14 0626 01/15/14 0700 01/15/14 0800  BP: 150/71  141/82 159/88  Pulse: 115  103 102  Temp:      TempSrc:      Resp: 18  19 17   Height:      Weight:  194 lb 10.7 oz (88.3 kg)    SpO2: 100%  100% 99%   General appearance: alert, appears older than stated age and no distress  Neck: no carotid bruit and no JVD  Lungs: clear to auscultation bilaterally  Heart: regular rhythm with occasional ectopy  Abdomen: soft, non-tender; bowel sounds normal; no masses, no organomegaly  Extremities: extremities normal, atraumatic, no cyanosis or edema  Pulses: 2+ and symmetric  Skin: Skin color, texture, turgor normal. No rashes or lesions  Neurologic: Mental status: Awake, espressive aphasia, dense hemiparesis on the right  Psych: Unable to assess   LABS  CBC  Recent Labs  01/14/14 0530 01/15/14 0530  WBC 6.5 5.4  HGB 10.4* 9.2*  HCT 32.0* 28.8*  MCV 79.0 79.3  PLT 292 440   Basic Metabolic Panel  Recent Labs  01/13/14 1823 01/14/14 0530 01/15/14 0530  NA 143 138 144  K 4.4 4.0 4.2  CL 109 105 110  CO2 22 22 24   GLUCOSE 154*  108* 104*  BUN 5* 6 9  CREATININE 1.34 1.33 1.56*  CALCIUM 9.5 9.2 8.9  MG 2.5  --   --   PHOS 2.2*  --   --    Cardiac Enzymes  Recent Labs  01/14/14 1032 01/14/14 1740 01/15/14 0315  TROPONINI <0.30 <0.30 <0.30   Radiology Studies Imaging results have been reviewed and No results found.  TELE Frequent PVCs, rare couplets. Only two episodes of wide complex tachycardia (SVT with rate related LBBB versus VT) overnight, notably a 30-beat run at 155 bpm at 6AM today. Hemodynamically stable throughout. When the arrhythmia converts to NSR, he still has a broad complex, typical LBBB pattern QRS for three beats, supporting a conduction related QRS abnormality rather than VT. All his WCT events have 1:1 AV association.  ECG When I reviewed his ECGs yesterday, I thought they represented  tachycardia with rate related LBBB. I was not aware of his low EF.  ASSESSMENT AND PLAN  I am not confident that he has VT. At least this morning's event has features that support SVT with rate related LBBB. His low LVEF does raise concerns for potential ventricular arrhythmia.  Either way, the same antiarrhythmic regimen is indicated (beta blockers +/-amiodarone). I agree that he is not a candidate for cardiac cath, revascularization procedures or AICD. Part of the problem is the very short effect of IV metoprolol. Will switch to crushed BID metoprolol tartrate in a dose equivalent to his chronic home Rx of metoprolol succinate. His arrhythmic events are likely related in part to beta blocker withdrawal. If unable to take PO, IV metoprolol should be dosed every 4 hours.  Would recommend review of his code status.   Sanda Klein, MD, [facility] [phone] office [phone] pager 01/15/2014 9:10 AM

## 2014-01-15 NOTE — Progress Notes (Signed)
[facility] PROGRESS NOTE  [name] VZD:[id number] DOB: 05-08-1946 DOA: 01/08/2014 PCP: No primary provider on file.  Brief narrative: 68 y.o. male, resident of SNF, with history of stroke with residual right-sided hemiparesis (on Plavix and aspirin), dementia, BPH with chronic indwelling Foley catheter, HTN, CKD, GERD, severe ulcerative esophagitis by EGD March 2014 with biopsy showing nonspecific inflammation, recently hospitalized in February of 2015 for hematemesis who presented to [facility] ED 01/08/2014 with main concern of coffee-ground emesis.  In ED, pt's SBP was in 200's with HR in 130 - 140's. He was found to have UTI (which now we know it is secondary to Klebsiella and Proteus species). Pt initially on vanco and zosyn but currently only on Levaquin based on sensitivity report. Pt required protonix drip on admission. EGD showed distal ulcerative, erosive esophagitis (4/30 - done by Dr. Penelope Coop)    Assessment and Plan:  Principal Problem:  Sepsis secondary to UTI Klebsiella and Proteus species - urine culture 4/25 grew klebsiella and proteus Both sensitive to Ciprofloxacin, Levaquin, Rocephin  - pt transitioned to oral Levaquin 4/29 and will continue until 01/16/2014  Active Problems:  Coffee ground emesis, Acute blood loss anemia - EGD with distal ulcerative, erosive esophagitis (4/30 - done by Dr. Penelope Coop)  - Hg/Hct remain stable and at baseline, 9.2 - continue Protonix BID  - continue ferrous sulfate supplementation NSVT/Sustained VT  - with LBBB; appreciate cardio input; they agree that cardiac cath is not recommended at this time; they made changes to metoprolol, now 100  Mg PO BID - continue to monitor in SDU for next 24 hours - Cardiomyopathy, probably non-ischemic, EF 30-35%  HTN, accelerated  - continue Hydralazine, Imdur and Metoprolol  - stopped Enalaprilat due to renal failure  Hemiparesis affecting right side as late effect of stroke  - PT one pt able to  participate  BPH (benign prostatic hyperplasia)  - continue Tamsulosin  Chronic kidney disease, stage III (moderate)  - Cr normalized but now back up to 1.56 - follow up BMP in am Type II or unspecified type diabetes mellitus with renal manifestations, uncontrolled  - continue SSI for now until oral intake improves  - CBG's in past 24 hours: 85, 145, 128 Hypokalemia  - supplemented and WNL  Decubitus ulcer of right buttock, stage 2  - wound care consulted Moderate malnutrition  - advance diet as pt able to tolerate  - diet dysphagia III    Code Status: Full  Family Communication: family not at the bedside Disposition Plan: remains in ICU  Consultants:  ID  GI  Cardiology  Wound care  Procedures/Studies:  None Antibiotics:  Vancomycin 4/25 --> 4/28  Zosyn 4/25 --> 4/28  Maxipime 4/28 --> 4/29  Levaquin 4/29 --> 05/03   Robbie Lis, MD  [facility] Pager [phone]  If 7PM-7AM, please contact night-coverage [email or web address] Password [email or web address] 01/15/2014, 4:02 PM   LOS: 7 days    HPI/Subjective: No acute overnight events.   Objective: Filed Vitals:   01/15/14 1300 01/15/14 1312 01/15/14 1400 01/15/14 1500  BP: 111/84 122/61 107/67 116/58  Pulse: 94  94 85  Temp:      TempSrc:      Resp: 21  27 13   Height:      Weight:      SpO2: 97%  97% 97%    Intake/Output Summary (Last 24 hours) at 01/15/14 1602 Last data filed at 01/15/14 1539  Gross per 24 hour  Intake    470 ml  Output    505 ml  Net    -35 ml    Exam:   General:  Pt is not in acute distress  Cardiovascular:tachycardic, S1/S2 apprecaited  Respiratory: Clear to auscultation bilaterally, no wheezing, no crackles  Abdomen: Soft, non tender, non distended, bowel sounds present  Extremities: No edema, pulses DP and PT palpable bilaterally  Neuro: Grossly nonfocal  Data Reviewed: Basic Metabolic Panel:  Recent Labs Lab 01/08/14 2120  01/10/14 0540  01/12/14 0530 01/13/14 0350  01/13/14 1823 01/14/14 0530 01/15/14 0530  NA  --   < > 144  < > 142 138 143 138 144  K  --   < > 3.9  < > 4.4 4.6 4.4 4.0 4.2  CL  --   < > 111  < > 110 106 109 105 110  CO2  --   < > 24  < > 23 21 22 22 24   GLUCOSE  --   < > 139*  < > 125* 93 154* 108* 104*  BUN  --   < > 12  < > 6 5* 5* 6 9  CREATININE  --   < > 1.39*  < > 1.37* 1.21 1.34 1.33 1.56*  CALCIUM  --   < > 8.9  < > 8.9 9.0 9.5 9.2 8.9  MG 1.8  --  2.0  --   --   --  2.5  --   --   PHOS  --   --   --   --   --   --  2.2*  --   --   < > = values in this interval not displayed. Liver Function Tests:  Recent Labs Lab 01/09/14 0400  AST 16  ALT 7  ALKPHOS 62  BILITOT 0.3  PROT 5.9*  ALBUMIN 2.8*   No results found for this basename: LIPASE, AMYLASE,  in the last 168 hours No results found for this basename: AMMONIA,  in the last 168 hours CBC:  Recent Labs Lab 01/11/14 0540 01/12/14 0530 01/13/14 0350 01/14/14 0530 01/15/14 0530  WBC 5.9 6.0 5.3 6.5 5.4  HGB 9.6* 9.6* 10.2* 10.4* 9.2*  HCT 30.5* 31.4* 33.3* 32.0* 28.8*  MCV 81.1 81.8 82.0 79.0 79.3  PLT 238 255 295 292 291   Cardiac Enzymes:  Recent Labs Lab 01/14/14 0200 01/14/14 1032 01/14/14 1740 01/15/14 0315 01/15/14 0945  TROPONINI <0.30 <0.30 <0.30 <0.30 <0.30   BNP: No components found with this basename: POCBNP,  CBG:  Recent Labs Lab 01/14/14 0743 01/14/14 1247 01/14/14 1710 01/14/14 2151 01/15/14 1144  GLUCAP 113* 160* 85 145* 128*    URINE CULTURE     Status: None   Collection Time    01/08/14  2:50 PM      Result Value Ref Range Status   Specimen Description URINE, CATHETERIZED   Final   Value: KLEBSIELLA PNEUMONIAE     PROTEUS MIRABILIS     Performed at [facility]   Report Status 01/12/2014 FINAL   Final   Organism ID, Bacteria KLEBSIELLA PNEUMONIAE   Final   Organism ID, Bacteria PROTEUS MIRABILIS   Final  CULTURE, BLOOD (ROUTINE X 2)     Status: None   Collection Time    01/08/14  3:30 PM       Result Value Ref Range Status   Specimen Description BLOOD LEFT ANTECUBITAL   Final   Value: STAPHYLOCOCCUS SPECIES (COAGULASE NEGATIVE)     Performed at [facility]   Report Status 01/11/2014 FINAL   Final  MRSA PCR SCREENING     Status: None   Collection Time    01/08/14  8:10 PM      Result Value Ref Range Status   MRSA by PCR NEGATIVE  NEGATIVE Final  URINE CULTURE     Status: None   Collection Time    01/08/14  9:38 PM      Result Value Ref Range Status   Specimen Description URINE, RANDOM   Final   Value: Multiple bacterial morphotypes present, none predominant. Suggest appropriate recollection if clinically indicated.     Performed at [facility]   Report Status 01/11/2014 FINAL   Final     Studies: No results found.  Scheduled Meds: . feeding supplement   237 mL Oral BID BM  . ferrous sulfate  325 mg Oral BID WC  . hydrALAZINE  10 mg Oral 3 times per day  . insulin aspart  0-9 Units Subcutaneous TID WC  . isosorbide-hydrALAZINE  1 tablet Oral BID  . levofloxacin  500 mg Oral Daily  . metoCLOPramide  5 mg Intravenous 4 times per day  . metoprolol tartrate  100 mg Oral BID  . pantoprazole   40 mg Intravenous Q12H  . tamsulosin  0.4 mg Oral Daily

## 2014-01-16 LAB — BASIC METABOLIC PANEL WITH GFR
BUN: 10 mg/dL (ref 6–23)
CO2: 25 meq/L (ref 19–32)
Calcium: 9.1 mg/dL (ref 8.4–10.5)
Chloride: 110 meq/L (ref 96–112)
Creatinine, Ser: 1.45 mg/dL — ABNORMAL HIGH (ref 0.50–1.35)
GFR calc Af Amer: 56 mL/min — ABNORMAL LOW
GFR calc non Af Amer: 48 mL/min — ABNORMAL LOW
Glucose, Bld: 96 mg/dL (ref 70–99)
Potassium: 4.2 meq/L (ref 3.7–5.3)
Sodium: 144 meq/L (ref 137–147)

## 2014-01-16 LAB — CBC
HCT: 27.1 % — ABNORMAL LOW (ref 39.0–52.0)
Hemoglobin: 8.6 g/dL — ABNORMAL LOW (ref 13.0–17.0)
MCH: 25.4 pg — ABNORMAL LOW (ref 26.0–34.0)
MCHC: 31.7 g/dL (ref 30.0–36.0)
MCV: 79.9 fL (ref 78.0–100.0)
Platelets: 274 10*3/uL (ref 150–400)
RBC: 3.39 MIL/uL — ABNORMAL LOW (ref 4.22–5.81)
RDW: 15.9 % — ABNORMAL HIGH (ref 11.5–15.5)
WBC: 5.2 10*3/uL (ref 4.0–10.5)

## 2014-01-16 LAB — TROPONIN I
Troponin I: <0.3 ng/mL
Troponin I: <0.3 ng/mL

## 2014-01-16 LAB — GLUCOSE, CAPILLARY
GLUCOSE-CAPILLARY: 125 mg/dL — AB (ref 70–99)
GLUCOSE-CAPILLARY: 105 mg/dL — AB (ref 70–99)
Glucose-Capillary: 101 mg/dL — ABNORMAL HIGH (ref 70–99)
Glucose-Capillary: 96 mg/dL (ref 70–99)

## 2014-01-16 MED ORDER — AMIODARONE HCL 200 MG PO TABS
200.0000 mg | ORAL_TABLET | Freq: Two times a day (BID) | ORAL | Status: DC
  Administered 2014-01-16 – 2014-01-20 (×9): 200 mg via ORAL
  Filled 2014-01-16 (×10): qty 1

## 2014-01-16 NOTE — Progress Notes (Signed)
Patient ID: [name], male   DOB: Oct 24, 1945, 68 y.o.   MRN: [id number]  [facility] PROGRESS NOTE  MACALISTER ARNAUD PFX:[id number] DOB: 12-29-1945 DOA: 01/08/2014 PCP: No primary provider on file.  Brief narrative:  68 y.o. male, resident of SNF, with history of stroke with residual right-sided hemiparesis (on Plavix and aspirin), dementia, BPH with chronic indwelling Foley catheter, HTN, CKD, GERD, severe ulcerative esophagitis by EGD March 2014 with biopsy showing nonspecific inflammation, recently hospitalized in February of 2015 for hematemesis who presented to [facility] ED 01/08/2014 with main concern of coffee-ground emesis.   In ED, pt's SBP was in 200's with HR in 130 - 140's. He was found to have UTI (which now we know it is secondary to Klebsiella and Proteus species). Pt initially on vanco and zosyn but currently only on Levaquin based on sensitivity report. Pt required protonix drip on admission. EGD showed distal ulcerative, erosive esophagitis (4/30 - done by Dr. Penelope Coop)   Assessment and Plan:  Principal Problem:  Sepsis secondary to UTI Klebsiella and Proteus species  - urine culture 4/25 grew klebsiella and proteus Both sensitive to Ciprofloxacin, Levaquin, Rocephin  - pt transitioned to oral Levaquin 4/29 and will continue until 01/16/2014, stop after today's dose   Active Problems:  Coffee ground emesis, Acute blood loss anemia  - EGD with distal ulcerative, erosive esophagitis (4/30 - done by Dr. Penelope Coop)  - Hg/Hct slightly down since yesterday, no signs of active bleeding  - continue Protonix BID  - continue ferrous sulfate supplementation  NSVT/Sustained VT  - with LBBB; appreciate cardio input; they agree that cardiac cath is not recommended at this time; they made changes to metoprolol, now 100 Mg PO BID  - continue to monitor in SDU - Cardiomyopathy, probably non-ischemic, EF 30-35%  - high risk for sudden cardiac arrest, will ask palliative care for further  assistance and addressing code status  HTN, accelerated  - continue Hydralazine, Imdur and Metoprolol  - stopped Enalaprilat due to renal failure  Hemiparesis affecting right side as late effect of stroke  - PT once pt able to participate  BPH (benign prostatic hyperplasia)  - continue Tamsulosin  Chronic kidney disease, stage III (moderate)  - Cr trending down over the past 24 hours  - follow up BMP in am  Type II or unspecified type diabetes mellitus with renal manifestations, uncontrolled  - continue SSI for now until oral intake improves  - CBG's in past 24 hours in 100's  Hypokalemia  - supplemented and WNL  Decubitus ulcer of right buttock, stage 2  - wound care consulted  Moderate malnutrition  - advance diet as pt able to tolerate  - diet dysphagia III   Code Status: Full  Family Communication: family not at the bedside  Disposition Plan: remains in ICU   Consultants:  ID  GI  Cardiology  Wound care  Procedures/Studies:  None Antibiotics:  Vancomycin 4/25 --> 4/28  Zosyn 4/25 --> 4/28  Maxipime 4/28 --> 4/29  Levaquin 4/29 --> 05/03  HPI/Subjective: No events overnight.   Objective: Filed Vitals:   01/16/14 1000 01/16/14 1056 01/16/14 1100 01/16/14 1200  BP: 131/71  148/63 117/61  Pulse: 99 86 75 91  Temp:      TempSrc:      Resp: 20  14 20   Height:      Weight:      SpO2: 100%  99% 99%    Intake/Output Summary (Last 24 hours) at 01/16/14 1652  Last data filed at 01/16/14 1200  Gross per 24 hour  Intake    350 ml  Output    885 ml  Net   -535 ml    Exam:   General:  Pt is alert, follows most of the commands appropriately, not in acute distress  Cardiovascular: irregular rate and rhythm, no rubs, no gallops  Respiratory: Clear to auscultation bilaterally, no wheezing, diminished breath sounds at bases   Abdomen: Soft, non tender, non distended, bowel sounds present, no guarding  Data Reviewed: Basic Metabolic Panel:  Recent Labs Lab  01/10/14 0540  01/13/14 0350 01/13/14 1823 01/14/14 0530 01/15/14 0530 01/16/14 0550  NA 144  < > 138 143 138 144 144  K 3.9  < > 4.6 4.4 4.0 4.2 4.2  CL 111  < > 106 109 105 110 110  CO2 24  < > 21 22 22 24 25   GLUCOSE 139*  < > 93 154* 108* 104* 96  BUN 12  < > 5* 5* 6 9 10   CREATININE 1.39*  < > 1.21 1.34 1.33 1.56* 1.45*  CALCIUM 8.9  < > 9.0 9.5 9.2 8.9 9.1  MG 2.0  --   --  2.5  --   --   --   PHOS  --   --   --  2.2*  --   --   --   < > = values in this interval not displayed.  CBC:  Recent Labs Lab 01/12/14 0530 01/13/14 0350 01/14/14 0530 01/15/14 0530 01/16/14 0550  WBC 6.0 5.3 6.5 5.4 5.2  HGB 9.6* 10.2* 10.4* 9.2* 8.6*  HCT 31.4* 33.3* 32.0* 28.8* 27.1*  MCV 81.8 82.0 79.0 79.3 79.9  PLT 255 295 292 291 274   Cardiac Enzymes:  Recent Labs Lab 01/15/14 0315 01/15/14 0945 01/15/14 1745 01/16/14 0150 01/16/14 0950  TROPONINI <0.30 <0.30 <0.30 <0.30 <0.30   CBG:  Recent Labs Lab 01/15/14 1552 01/15/14 1757 01/15/14 2127 01/16/14 0820 01/16/14 1209  GLUCAP 119* 94 99 101* 125*    Recent Results (from the past 240 hour(s))  URINE CULTURE     Status: None   Collection Time    01/08/14  2:50 PM      Result Value Ref Range Status   Specimen Description URINE, CATHETERIZED   Final   Special Requests NONE   Final   Culture  Setup Time     Final   Value: 01/08/2014 17:48     Performed at [place]     Final   Value: >=100,000 COLONIES/ML     Performed at [facility]   Culture     Final   Value: Garrett     Performed at [facility]   Report Status 01/12/2014 FINAL   Final   Organism ID, Bacteria KLEBSIELLA PNEUMONIAE   Final   Organism ID, Bacteria PROTEUS MIRABILIS   Final  CULTURE, BLOOD (ROUTINE X 2)     Status: None   Collection Time    01/08/14  3:30 PM      Result Value Ref Range Status   Specimen Description BLOOD LEFT ANTECUBITAL   Final   Special  Requests BOTTLES DRAWN AEROBIC AND ANAEROBIC 2CC   Final   Culture  Setup Time     Final   Value: 01/08/2014 21:32     Performed at [facility]  Final   Value: STAPHYLOCOCCUS SPECIES (COAGULASE NEGATIVE)     Note: THE SIGNIFICANCE OF ISOLATING THIS ORGANISM FROM A SINGLE VENIPUNCTURE CANNOT BE PREDICTED WITHOUT FURTHER CLINICAL AND CULTURE CORRELATION. SUSCEPTIBILITIES AVAILABLE ONLY ON REQUEST.     Note: Gram Stain Report Called to,Read Back By and Verified With: Carlyn Reichert 01/09/14 @ 5PM BY [name]     Performed at [facility]   Report Status 01/11/2014 FINAL   Final  MRSA PCR SCREENING     Status: None   Collection Time    01/08/14  8:10 PM      Result Value Ref Range Status   MRSA by PCR NEGATIVE  NEGATIVE Final   Comment:            The GeneXpert MRSA Assay (FDA     approved for NASAL specimens     only), is one component of a     comprehensive MRSA colonization     surveillance program. It is not     intended to diagnose MRSA     infection nor to guide or     monitor treatment for     MRSA infections.  URINE CULTURE     Status: None   Collection Time    01/08/14  9:38 PM      Result Value Ref Range Status   Specimen Description URINE, RANDOM   Final   Special Requests NONE   Final   Culture  Setup Time     Final   Value: 01/09/2014 03:53     Performed at [place]     Final   Value: 50,000 COLONIES/ML     Performed at [facility]   Culture     Final   Value: Multiple bacterial morphotypes present, none predominant. Suggest appropriate recollection if clinically indicated.     Performed at [facility]   Report Status 01/11/2014 FINAL   Final     Scheduled Meds: . amiodarone  200 mg Oral BID  . ferrous sulfate  325 mg Oral BID WC  . hydrALAZINE  10 mg Oral 3 times per day  . insulin aspart  0-9 Units Subcutaneous TID WC  . isosorbide-hydrALAZINE  1 tablet Oral BID  . levofloxacin  500 mg  Oral Daily  . metoCLOPramide   5 mg Intravenous 4 times per day  . metoprolol tartrate  100 mg Oral BID  . pantoprazole  IV  40 mg Intravenous Q12H  . tamsulosin  0.4 mg Oral Daily   Continuous Infusions:   Theodis Blaze, MD  [facility] Pager [phone]  If 7PM-7AM, please contact night-coverage [email or web address] Password [email or web address] 01/16/2014, 4:52 PM   LOS: 8 days

## 2014-01-16 NOTE — Progress Notes (Signed)
PHARMACY BRIEF NOTE:  Drug Interaction -  Amiodarone and Levofloxacin  Please note this potential drug interaction.  Prolongation of the QTc interval may occur with fluoroquinolone antibiotics. Although the incidence of torsades has been considered rare with amiodarone, there have been case reports of torsades de pointes following administration of concurrent amiodarone and levofloxacin.  Caution is warranted when these two agents are administered concurrently.  [place]. 01/16/2014 9:36 AM

## 2014-01-16 NOTE — Progress Notes (Signed)
Patient Name: [name] Date of Encounter: 01/16/2014  Principal Problem:   Sepsis Active Problems:   Dementia   Stroke   GERD (gastroesophageal reflux disease)   UGIB (upper gastrointestinal bleed)   Hemiparesis affecting right side as late effect of stroke   Expressive aphasia   Hypertension, accelerated   BPH (benign prostatic hyperplasia)   Dyslipidemia   Depression   Anemia in chronic kidney disease(285.21)   Chronic kidney disease, stage III (moderate)   Type II or unspecified type diabetes mellitus with renal manifestations, uncontrolled   Tachycardia   Cardiomyopathy: Per 2 D echo 10/22/13 EF 30-35%   Coffee ground emesis   Hypokalemia   Decubitus ulcer of right buttock, stage 2   UTI (urinary tract infection)   Bacteremia   Acute blood loss anemia   Dysphagia, unspecified(787.20)   NSVT (nonsustained ventricular tachycardia)   Length of Stay: 8  SUBJECTIVE  No clinical status changes. Continues to have episodes of wide complex tachycardia, athough the frequency and duration of these is lower. An episode occurred while I was seeing him. It is triggered after a PVC with a different morphology than the tachycardia, has 1:1 AV association, rate 169 bpm and terminated after roughly 30 seconds. He remained alert and unperturbed during the event. His pulse oximeter waveform shows good perfusion during the event  CURRENT MEDS . feeding supplement (ENSURE COMPLETE)  237 mL Oral BID BM  . ferrous sulfate  325 mg Oral BID WC  . hydrALAZINE  10 mg Oral 3 times per day  . insulin aspart  0-9 Units Subcutaneous TID WC  . isosorbide-hydrALAZINE  1 tablet Oral BID  . levofloxacin  500 mg Oral Daily  . metoCLOPramide (REGLAN) injection  5 mg Intravenous 4 times per day  . metoprolol tartrate  100 mg Oral BID  . pantoprazole (PROTONIX) IV  40 mg Intravenous Q12H  . sodium chloride  10-40 mL Intracatheter Q12H  . sodium chloride  3 mL Intravenous Q12H  . tamsulosin  0.4  mg Oral Daily    OBJECTIVE   Intake/Output Summary (Last 24 hours) at 01/16/14 0846 Last data filed at 01/16/14 0520  Gross per 24 hour  Intake    620 ml  Output    675 ml  Net    -55 ml   Filed Weights   01/14/14 0500 01/15/14 0626 01/16/14 0400  Weight: 189 lb 6 oz (85.9 kg) 194 lb 10.7 oz (88.3 kg) 189 lb 13.1 oz (86.1 kg)    PHYSICAL EXAM Filed Vitals:   01/16/14 0100 01/16/14 0300 01/16/14 0400 01/16/14 0500  BP: 145/79 168/71  151/90  Pulse: 109 85  88  Temp:   98.8 F (37.1 C)   TempSrc:   Axillary   Resp: 24 13  15   Height:      Weight:   189 lb 13.1 oz (86.1 kg)   SpO2: 100% 100%  100%   General appearance: alert, appears older than stated age and no distress  Neck: no carotid bruit and no JVD  Lungs: clear to auscultation bilaterally  Heart: regular rhythm with occasional ectopy  Abdomen: soft, non-tender; bowel sounds normal; no masses, no organomegaly  Extremities: extremities normal, atraumatic, no cyanosis or edema  Pulses: 2+ and symmetric  Skin: Skin color, texture, turgor normal. No rashes or lesions  Neurologic: Mental status: Awake, espressive aphasia, dense hemiparesis on the right  Psych: Unable to assess  LABS  CBC  Recent Labs  01/15/14 0530 01/16/14  0550  WBC 5.4 5.2  HGB 9.2* 8.6*  HCT 28.8* 27.1*  MCV 79.3 79.9  PLT 291 665   Basic Metabolic Panel  Recent Labs  01/13/14 1823  01/15/14 0530 01/16/14 0550  NA 143  < > 144 144  K 4.4  < > 4.2 4.2  CL 109  < > 110 110  CO2 22  < > 24 25  GLUCOSE 154*  < > 104* 96  BUN 5*  < > 9 10  CREATININE 1.34  < > 1.56* 1.45*  CALCIUM 9.5  < > 8.9 9.1  MG 2.5  --   --   --   PHOS 2.2*  --   --   --   < > = values in this interval not displayed. Cardiac Enzymes  Recent Labs  01/15/14 0945 01/15/14 1745 01/16/14 0150  TROPONINI <0.30 <0.30 <0.30    Radiology Studies Imaging results have been reviewed and No results found.  TELE Described above - mostly SR 90-100 bpm; rate  nonsustained and brief sustained VT  ASSESSMENT AND PLAN Moderate cardiomyopathy, EF around 35% Recurrent wide complex tachycardia, probably VT, well tolerated hemodynamically Poor functional status - not a good candidate for aggressive cardiac therapy or ICD.  Add amiodarone to high dose beta blocker. He is incapacitated due to hemiparesis and appears to be at risk for aspiration. Consider readdressing DNR status.  Sanda Klein, MD, [facility] [phone] office [phone] pager 01/16/2014 8:46 AM

## 2014-01-16 NOTE — Progress Notes (Signed)
P's  urine output is low. [place] [facility] aware. Order for 500 cc bolus given will continue to monitor.

## 2014-01-17 DIAGNOSIS — R5383 Other fatigue: Secondary | ICD-10-CM

## 2014-01-17 DIAGNOSIS — R4701 Aphasia: Secondary | ICD-10-CM

## 2014-01-17 DIAGNOSIS — I509 Heart failure, unspecified: Secondary | ICD-10-CM

## 2014-01-17 DIAGNOSIS — R5381 Other malaise: Secondary | ICD-10-CM

## 2014-01-17 DIAGNOSIS — Z515 Encounter for palliative care: Secondary | ICD-10-CM

## 2014-01-17 LAB — CBC
HCT: 26 % — ABNORMAL LOW (ref 39.0–52.0)
Hemoglobin: 8.1 g/dL — ABNORMAL LOW (ref 13.0–17.0)
MCH: 25.3 pg — ABNORMAL LOW (ref 26.0–34.0)
MCHC: 31.2 g/dL (ref 30.0–36.0)
MCV: 81.3 fL (ref 78.0–100.0)
PLATELETS: 264 10*3/uL (ref 150–400)
RBC: 3.2 MIL/uL — ABNORMAL LOW (ref 4.22–5.81)
RDW: 15.6 % — AB (ref 11.5–15.5)
WBC: 5.7 10*3/uL (ref 4.0–10.5)

## 2014-01-17 LAB — GLUCOSE, CAPILLARY
Glucose-Capillary: 89 mg/dL (ref 70–99)
Glucose-Capillary: 107 mg/dL — ABNORMAL HIGH (ref 70–99)
Glucose-Capillary: 95 mg/dL (ref 70–99)

## 2014-01-17 LAB — BASIC METABOLIC PANEL
BUN: 9 mg/dL (ref 6–23)
CHLORIDE: 109 meq/L (ref 96–112)
CO2: 27 mEq/L (ref 19–32)
Calcium: 9.1 mg/dL (ref 8.4–10.5)
Creatinine, Ser: 1.41 mg/dL — ABNORMAL HIGH (ref 0.50–1.35)
GFR calc non Af Amer: 50 mL/min — ABNORMAL LOW (ref >90)
GFR, EST AFRICAN AMERICAN: 58 mL/min — AB (ref >90)
Glucose, Bld: 94 mg/dL (ref 70–99)
POTASSIUM: 4.1 meq/L (ref 3.7–5.3)
SODIUM: 143 meq/L (ref 137–147)

## 2014-01-17 LAB — TROPONIN I: Troponin I: <0.3 ng/mL (ref <0.30)

## 2014-01-17 NOTE — Consult Note (Signed)
Patient [name]      DOB: 11/14/1945      GYJ:[id number]     Consult Note from the Palliative Medicine Team at [place] Requested by: Dr Doyle Askew     PCP: No primary provider on file. Reason for Consultation: Clarification of GOC and options    Phone Number:None  Assessment of patients Current state:  Continued complex medical situation.  PMH; stroke (per family drug OD and "brain surgery, 13 yrs ago), dementia, HTN, total care lives at a SNF, dysphagia, significant cardiac history with VT with LBBB, EF 40% (not a candidate for further evaluation or AICD).  Not with capacity and family is faced with advanced decisions and anticipatory care needs.   Consult is for review of medical treatment options, clarification of goals of care and end of life issues, disposition and options, and symptom recommendation.  This NP [name] reviewed medical records, received report from team, assessed the patient and then meet at the patient's bedside along with his brother/main decision maker [name]  to discuss diagnosis prognosis, [place], EOL wishes disposition and options.  A detailed discussion was had today regarding advanced directives.  Concepts specific to code status, artifical feeding and hydration, continued IV antibiotics and rehospitalization was had.  The difference between a aggressive medical intervention path  and a palliative comfort care path for this patient at this time was had.  Values and goals of care important to patient and family were attempted to be elicited.  Concept of Hospice and Palliative Care were discussed     Questions and concerns addressed. MOST form was review. Family encouraged to call with questions or concerns.  PMT will continue to support holistically.   Goals of Care: 1.  Code Status:  Full code  Difficult conversation attempting  to help his brother/main decsion maker understand the seriousness of the situation and the  limited treatment options availble for his brother.  He speaks to a past experience in which he believes his wife was deprived of good medical care and died because of neglect.  This is influencing his decsions at hsi time.  However he he states if all attempts to save his brother are exhausted he would not let him "live on machines".    2. Scope of Treatment:  At this time continue all availble and offered medcail interventions to prolong life.  3. Disposition:  To SNF when medically stable, recommend hospice services if eligible at the time   4. Symptom Management:   1.  Weakness: medical management of treatable illness  5. Psychosocial:  Emotional support to patient and his brother at bedside.  Family is encouraged to re-examine plan of care and consider decisions dependant on daily outcomes   6. Spiritual:  Chaplain services consulted  Brief YOV:ZCHY is a 68 y.o. male with a past medical history significant for acute GI bleed in 10/2012 - found by echo to have a moderate pericardial effusion. Also a history of hypertension, dementia, GERD, and stroke with right-sided hemiparesis as well as expressive aphasia. He had an abdominal CT which showed cardiac enlargement with pericardial effusion. An echocardiogram was performed which demonstrated a large pericardial effusion with possible inhibition of his right atrial free wall with no evidence of right ventricular collapse, unable to visualize IVC. This was ultimately drained by pericardial window on 11/10/12. EF was noted to be newly reduced to 40% range on echocardiogram. He was noted to have a LBBB. He was  noted to have some NSVT during his prior hospitalization. He is now admitted with recurrent hematemesis and coffee ground emesis. He was also found to have UTI and positive blood cultures. During hospitalization, noted to have sustained VT in the 140's which broke with IV lopressor - he has had some PVC's and bigeminy overnight. Complicated  medical situation with limited treatment options   ROS: unable to illicit due to aphasia   PMH:  Past Medical History  Diagnosis Date  . Dementia   . Stroke   . Hydronephrosis   . Hypertension   . Hyperlipemia   . GERD (gastroesophageal reflux disease)   . BPH (benign prostatic hyperplasia)   . Cardiomyopathy: Per 2 D echo 10/22/13 EF 30-35% 01/08/2014     PSH: Past Surgical History  Procedure Laterality Date  . Esophagogastroduodenoscopy N/A 11/11/2012    Procedure: ESOPHAGOGASTRODUODENOSCOPY (EGD);  Surgeon: Lear Ng, MD;  Location: [facility] ENDOSCOPY;  Service: Endoscopy;  Laterality: N/A;  . Subxyphoid pericardial window N/A 11/13/2012    Procedure: SUBXYPHOID PERICARDIAL WINDOW;  Surgeon: Grace Isaac, MD;  Location: [facility];  Service: Thoracic;  Laterality: N/A;  . Esophagogastroduodenoscopy N/A 11/25/2012    Procedure: ESOPHAGOGASTRODUODENOSCOPY (EGD);  Surgeon: Winfield Cunas., MD;  Location: [facility] ENDOSCOPY;  Service: Endoscopy;  Laterality: N/A;  . Esophagogastroduodenoscopy N/A 01/13/2014    Procedure: ESOPHAGOGASTRODUODENOSCOPY (EGD);  Surgeon: Wonda Horner, MD;  Location: [facility] ENDOSCOPY;  Service: Endoscopy;  Laterality: N/A;   I have reviewed the [place] and SH and  If appropriate update it with new information. No Known Allergies Scheduled Meds: . amiodarone  200 mg Oral BID  . feeding supplement (ENSURE COMPLETE)  237 mL Oral BID BM  . ferrous sulfate  325 mg Oral BID WC  . hydrALAZINE  10 mg Oral 3 times per day  . insulin aspart  0-9 Units Subcutaneous TID WC  . isosorbide-hydrALAZINE  1 tablet Oral BID  . metoCLOPramide (REGLAN) injection  5 mg Intravenous 4 times per day  . metoprolol tartrate  100 mg Oral BID  . pantoprazole (PROTONIX) IV  40 mg Intravenous Q12H  . sodium chloride  10-40 mL Intracatheter Q12H  . sodium chloride  3 mL Intravenous Q12H  . tamsulosin  0.4 mg Oral Daily   Continuous Infusions:  PRN Meds:.acetaminophen, acetaminophen,  hydrALAZINE, ipratropium, levalbuterol, morphine injection, ondansetron (ZOFRAN) IV, ondansetron, sodium chloride    BP 110/61  Pulse 80  Temp(Src) 98.3 F (36.8 C) (Oral)  Resp 22  Ht 6' 0.05" (1.83 m)  Wt 87.1 kg (192 lb 0.3 oz)  BMI 26.01 kg/m2  SpO2 99%   PPS:30 % at best   Intake/Output Summary (Last 24 hours) at 01/17/14 1257 Last data filed at 01/17/14 0919  Gross per 24 hour  Intake    384 ml  Output    790 ml  Net   -406 ml    Physical Exam:  General: chronically ill appearing ,NAD HEENT:  Mm, no exudate Chest:   Decreased in bases, CTA CVS: RRR Abdomen:soft NT +BS Ext: without dedam, + muscle atrophy Neuro: alert and oriented to person and place  Labs: CBC    Component Value Date/Time   WBC 5.7 01/17/2014 0525   RBC 3.20* 01/17/2014 0525   HGB 8.1* 01/17/2014 0525   HCT 26.0* 01/17/2014 0525   PLT 264 01/17/2014 0525   MCV 81.3 01/17/2014 0525   MCH 25.3* 01/17/2014 0525   MCHC 31.2 01/17/2014 0525   RDW 15.6* 01/17/2014  0525   LYMPHSABS 1.2 01/08/2014 1530   MONOABS 0.8 01/08/2014 1530   EOSABS 0.0 01/08/2014 1530   BASOSABS 0.0 01/08/2014 1530    BMET    Component Value Date/Time   NA 143 01/17/2014 0525   K 4.1 01/17/2014 0525   CL 109 01/17/2014 0525   CO2 27 01/17/2014 0525   GLUCOSE 94 01/17/2014 0525   BUN 9 01/17/2014 0525   CREATININE 1.41* 01/17/2014 0525   CALCIUM 9.1 01/17/2014 0525   GFRNONAA 50* 01/17/2014 0525   GFRAA 58* 01/17/2014 0525    CMP     Component Value Date/Time   NA 143 01/17/2014 0525   K 4.1 01/17/2014 0525   CL 109 01/17/2014 0525   CO2 27 01/17/2014 0525   GLUCOSE 94 01/17/2014 0525   BUN 9 01/17/2014 0525   CREATININE 1.41* 01/17/2014 0525   CALCIUM 9.1 01/17/2014 0525   PROT 5.9* 01/09/2014 0400   ALBUMIN 2.8* 01/09/2014 0400   AST 16 01/09/2014 0400   ALT 7 01/09/2014 0400   ALKPHOS 62 01/09/2014 0400   BILITOT 0.3 01/09/2014 0400   GFRNONAA 50* 01/17/2014 0525   GFRAA 58* 01/17/2014 0525      Time In Time Out Total Time Spent with Patient Total  Overall Time  1200 1315 70 min 75 min    Greater than 50%  of this time was spent counseling and coordinating care related to the above assessment and plan.   Ricky Lessen NP  Palliative Medicine Team Team Phone # [phone] Pager [phone]  Discussed with Dr Doyle Askew

## 2014-01-17 NOTE — Progress Notes (Signed)
    Subjective:  No complaints. No CP, no SOB.   Objective:  Vital Signs in the last 24 hours: Temp:  [97.6 F (36.4 C)-98.6 F (37 C)] 97.6 F (36.4 C) (05/04 0400) Pulse Rate:  [75-101] 87 (05/04 0400) Resp:  [13-22] 13 (05/04 0400) BP: (94-176)/(37-88) 151/77 mmHg (05/04 0400) SpO2:  [95 %-100 %] 99 % (05/04 0400) Weight:  [192 lb 0.3 oz (87.1 kg)] 192 lb 0.3 oz (87.1 kg) (05/04 0400)  Intake/Output from previous day: 05/03 0701 - 05/04 0700 In: 724 [P.O.:724] Out: [place] [Urine:1190]   Physical Exam: General: Well developed, well nourished, in no acute distress. Head:  Normocephalic and atraumatic. Lungs: Clear to auscultation and percussion. Heart: Normal S1 and S2, ectopy.  No murmur, rubs or gallops.  Abdomen: soft, non-tender, positive bowel sounds. Extremities: No clubbing or cyanosis. No edema. Neurologic: Alert, hemiparesis    Lab Results:  Recent Labs  01/16/14 0550 01/17/14 0525  WBC 5.2 5.7  HGB 8.6* 8.1*  PLT 274 264    Recent Labs  01/16/14 0550 01/17/14 0525  NA 144 143  K 4.2 4.1  CL 110 109  CO2 25 27  GLUCOSE 96 94  BUN 10 9  CREATININE 1.45* 1.41*    Recent Labs  01/16/14 1745 01/17/14 0130  TROPONINI <0.30 <0.30   EKG: Sinus tachy, PAC  Cardiac Studies:  EF 30%, moderate LVH  Assessment/Plan:  Principal Problem:   Sepsis Active Problems:   Dementia   Stroke   GERD (gastroesophageal reflux disease)   UGIB (upper gastrointestinal bleed)   Hemiparesis affecting right side as late effect of stroke   Expressive aphasia   Hypertension, accelerated   BPH (benign prostatic hyperplasia)   Dyslipidemia   Depression   Anemia in chronic kidney disease(285.21)   Chronic kidney disease, stage III (moderate)   Type II or unspecified type diabetes mellitus with renal manifestations, uncontrolled   Tachycardia   Cardiomyopathy: Per 2 D echo 10/22/13 EF 30-35%   Coffee ground emesis   Hypokalemia   Decubitus ulcer of right  buttock, stage 2   UTI (urinary tract infection)   Bacteremia   Acute blood loss anemia   Dysphagia, unspecified(787.20)   NSVT (nonsustained ventricular tachycardia)  67 with stroke, dementia, recent UGI bleed, HTN, NSVT/SVT.   - agree with amiodarone for suppression  - 5/3 episode noted on tele.  - High dose beta blocker  - Not candidate for ICD  - Appears comfortable. No symptoms of heart failure.   - If creatinine. Remains stable, consider low dose lasix 20mg  PO QD for chronic therapy as well as low dose ACE-I.    Will sign off. Please call with any questions.    Candee Furbish 01/17/2014, 7:56 AM

## 2014-01-17 NOTE — Progress Notes (Addendum)
Patient ID: [name], male   DOB: 1946-01-25, 68 y.o.   MRN: [id number]  [facility] PROGRESS NOTE  [name] OBS:[id number] DOB: 07-23-1946 DOA: 01/08/2014 PCP: No primary provider on file.  Brief narrative:  68 y.o. male, resident of SNF, with history of stroke with residual right-sided hemiparesis (on Plavix and aspirin), dementia, BPH with chronic indwelling Foley catheter, HTN, CKD, GERD, severe ulcerative esophagitis by EGD March 2014 with biopsy showing nonspecific inflammation, recently hospitalized in February of 2015 for hematemesis who presented to [facility] ED 01/08/2014 with main concern of coffee-ground emesis.   In ED, pt's SBP was in 200's with HR in 130 - 140's. He was found to have UTI (which now we know it is secondary to Klebsiella and Proteus species). Pt initially on vanco and zosyn but currently only on Levaquin based on sensitivity report. Pt required protonix drip on admission. EGD showed distal ulcerative, erosive esophagitis (4/30 - done by Dr. Penelope Coop).   Assessment and Plan:  Principal Problem:  Sepsis secondary to UTI Klebsiella and Proteus species  - urine culture 4/25 grew klebsiella and proteus Both sensitive to Ciprofloxacin, Levaquin, Rocephin  - pt transitioned to oral Levaquin 4/29 and continued until 01/16/2014 Active Problems:  Coffee ground emesis, Acute blood loss anemia  - EGD with distal ulcerative, erosive esophagitis (4/30 - done by Dr. Penelope Coop)  - Hg/Hct remains overall stable, no signs of active bleeding  - continue Protonix BID  - continue ferrous sulfate supplementation  NSVT/Sustained VT  - with LBBB; appreciate cardio input; they agree that cardiac cath is not recommended at this time; they made changes to metoprolol, now 100 Mg PO BID  - Cardiomyopathy, probably non-ischemic, EF 30-35%  - high risk for sudden cardiac arrest - PCT consulted, pt remains full code - transfer to telemetry bed  HTN, accelerated  - continue  Hydralazine, Imdur and Metoprolol  - stopped Enalaprilat due to renal failure  Hemiparesis affecting right side as late effect of stroke  - PT once pt able to participate  BPH (benign prostatic hyperplasia)  - continue Tamsulosin  Chronic kidney disease, stage III (moderate)  - Cr slowly trending down over the past 24 hours  - follow up BMP in am  Type II or unspecified type diabetes mellitus with renal manifestations, uncontrolled  - continue SSI for now until oral intake improves  - CBG's in past 24 hours in 100's  Hypokalemia  - supplemented and WNL  Decubitus ulcer of right buttock, stage 2  - wound care consulted  Moderate malnutrition  - advance diet as pt able to tolerate  - diet dysphagia III   Code Status: Full  Family Communication: brother over the phone Disposition Plan: transfer to telemetry   Consultants:  ID  GI  Cardiology  Wound care  Procedures/Studies:  None Antibiotics:  Vancomycin 4/25 --> 4/28  Zosyn 4/25 --> 4/28  Maxipime 4/28 --> 4/29  Levaquin 4/29 --> 05/03  HPI/Subjective: No events overnight.   Objective: Filed Vitals:   01/16/14 2000 01/17/14 0000 01/17/14 0400 01/17/14 0800  BP: 146/74 116/75 151/77   Pulse: 97 97 87   Temp: 98.5 F (36.9 C) 98.2 F (36.8 C) 97.6 F (36.4 C) 98.3 F (36.8 C)  TempSrc: Oral Oral Axillary Oral  Resp: 20 20 13    Height:      Weight:   87.1 kg (192 lb 0.3 oz)   SpO2: 99% 95% 99%     Intake/Output Summary (Last 24 hours)  at 01/17/14 3810 Last data filed at 01/17/14 1751  Gross per 24 hour  Intake    724 ml  Output    990 ml  Net   -266 ml    Exam:   General:  Pt is alert, follows most of the commands appropriately, not in acute distress  Cardiovascular: Regular rate and rhythm, ectopy, no rubs, no gallops  Respiratory: Clear to auscultation bilaterally, no wheezing, diminished breath sounds at bases  Abdomen: Soft, non tender, non distended, bowel sounds present, no guarding  Data  Reviewed: Basic Metabolic Panel:  Recent Labs Lab 01/13/14 1823 01/14/14 0530 01/15/14 0530 01/16/14 0550 01/17/14 0525  NA 143 138 144 144 143  K 4.4 4.0 4.2 4.2 4.1  CL 109 105 110 110 109  CO2 22 22 24 25 27   GLUCOSE 154* 108* 104* 96 94  BUN 5* 6 9 10 9   CREATININE 1.34 1.33 1.56* 1.45* 1.41*  CALCIUM 9.5 9.2 8.9 9.1 9.1  MG 2.5  --   --   --   --   PHOS 2.2*  --   --   --   --    CBC:  Recent Labs Lab 01/13/14 0350 01/14/14 0530 01/15/14 0530 01/16/14 0550 01/17/14 0525  WBC 5.3 6.5 5.4 5.2 5.7  HGB 10.2* 10.4* 9.2* 8.6* 8.1*  HCT 33.3* 32.0* 28.8* 27.1* 26.0*  MCV 82.0 79.0 79.3 79.9 81.3  PLT 295 292 291 274 264   Cardiac Enzymes:  Recent Labs Lab 01/15/14 1745 01/16/14 0150 01/16/14 0950 01/16/14 1745 01/17/14 0130  TROPONINI <0.30 <0.30 <0.30 <0.30 <0.30   CBG:  Recent Labs Lab 01/15/14 2127 01/16/14 0820 01/16/14 1209 01/16/14 1701 01/16/14 2124  GLUCAP 99 101* 125* 105* 96    Recent Results (from the past 240 hour(s))  URINE CULTURE     Status: None   Collection Time    01/08/14  2:50 PM      Result Value Ref Range Status   Specimen Description URINE, CATHETERIZED   Final   Special Requests NONE   Final   Culture  Setup Time     Final   Value: 01/08/2014 17:48     Performed at [place]     Final   Value: >=100,000 COLONIES/ML     Performed at [facility]   Culture     Final   Value: [place]     Performed at [facility]   Report Status 01/12/2014 FINAL   Final   Organism ID, Bacteria KLEBSIELLA PNEUMONIAE   Final   Organism ID, Bacteria PROTEUS MIRABILIS   Final  CULTURE, BLOOD (ROUTINE X 2)     Status: None   Collection Time    01/08/14  3:30 PM      Result Value Ref Range Status   Specimen Description BLOOD LEFT ANTECUBITAL   Final   Special Requests BOTTLES DRAWN AEROBIC AND ANAEROBIC 2CC   Final   Culture  Setup Time     Final   Value:  01/08/2014 21:32     Performed at [facility]   Culture     Final   Value: STAPHYLOCOCCUS SPECIES (COAGULASE NEGATIVE)     Note: THE SIGNIFICANCE OF ISOLATING THIS ORGANISM FROM A SINGLE VENIPUNCTURE CANNOT BE PREDICTED WITHOUT FURTHER CLINICAL AND CULTURE CORRELATION. SUSCEPTIBILITIES AVAILABLE ONLY ON REQUEST.     Note: Gram Stain Report Called to,Read Back By and  Verified With: Carlyn Reichert 01/09/14 @ 5PM BY [name]     Performed at [facility]   Report Status 01/11/2014 FINAL   Final  MRSA PCR SCREENING     Status: None   Collection Time    01/08/14  8:10 PM      Result Value Ref Range Status   MRSA by PCR NEGATIVE  NEGATIVE Final   Comment:            The GeneXpert MRSA Assay (FDA     approved for NASAL specimens     only), is one component of a     comprehensive MRSA colonization     surveillance program. It is not     intended to diagnose MRSA     infection nor to guide or     monitor treatment for     MRSA infections.  URINE CULTURE     Status: None   Collection Time    01/08/14  9:38 PM      Result Value Ref Range Status   Specimen Description URINE, RANDOM   Final   Special Requests NONE   Final   Culture  Setup Time     Final   Value: 01/09/2014 03:53     Performed at [place]     Final   Value: 50,000 COLONIES/ML     Performed at [facility]   Culture     Final   Value: Multiple bacterial morphotypes present, none predominant. Suggest appropriate recollection if clinically indicated.     Performed at [facility]   Report Status 01/11/2014 FINAL   Final     Scheduled Meds: . amiodarone  200 mg Oral BID  . feeding supplement (ENSURE COMPLETE)  237 mL Oral BID BM  . ferrous sulfate  325 mg Oral BID WC  . hydrALAZINE  10 mg Oral 3 times per day  . insulin aspart  0-9 Units Subcutaneous TID WC  . isosorbide-hydrALAZINE  1 tablet Oral BID  . metoCLOPramide (REGLAN) injection  5 mg Intravenous 4 times  per day  . metoprolol tartrate  100 mg Oral BID  . pantoprazole (PROTONIX) IV  40 mg Intravenous Q12H  . sodium chloride  10-40 mL Intracatheter Q12H  . sodium chloride  3 mL Intravenous Q12H  . tamsulosin  0.4 mg Oral Daily   Continuous Infusions:   Theodis Blaze, MD  [facility] Pager [phone]  If 7PM-7AM, please contact night-coverage [email or web address] Password [email or web address] 01/17/2014, 8:39 AM   LOS: 9 days

## 2014-01-18 DIAGNOSIS — Z515 Encounter for palliative care: Secondary | ICD-10-CM

## 2014-01-18 DIAGNOSIS — R531 Weakness: Secondary | ICD-10-CM

## 2014-01-18 LAB — CBC
HEMATOCRIT: 26.4 % — AB (ref 39.0–52.0)
HEMOGLOBIN: 8.3 g/dL — AB (ref 13.0–17.0)
MCH: 25.1 pg — ABNORMAL LOW (ref 26.0–34.0)
MCHC: 31.4 g/dL (ref 30.0–36.0)
MCV: 79.8 fL (ref 78.0–100.0)
Platelets: 263 10*3/uL (ref 150–400)
RBC: 3.31 MIL/uL — ABNORMAL LOW (ref 4.22–5.81)
RDW: 15.2 % (ref 11.5–15.5)
WBC: 4.4 10*3/uL (ref 4.0–10.5)

## 2014-01-18 LAB — BASIC METABOLIC PANEL
BUN: 10 mg/dL (ref 6–23)
CHLORIDE: 107 meq/L (ref 96–112)
CO2: 24 mEq/L (ref 19–32)
CREATININE: 1.43 mg/dL — AB (ref 0.50–1.35)
Calcium: 8.3 mg/dL — ABNORMAL LOW (ref 8.4–10.5)
GFR calc non Af Amer: 49 mL/min — ABNORMAL LOW (ref >90)
GFR, EST AFRICAN AMERICAN: 57 mL/min — AB (ref >90)
GLUCOSE: 87 mg/dL (ref 70–99)
Potassium: 3.6 mEq/L — ABNORMAL LOW (ref 3.7–5.3)
Sodium: 140 mEq/L (ref 137–147)

## 2014-01-18 LAB — GLUCOSE, CAPILLARY
GLUCOSE-CAPILLARY: 84 mg/dL (ref 70–99)
Glucose-Capillary: 94 mg/dL (ref 70–99)
Glucose-Capillary: 78 mg/dL (ref 70–99)
Glucose-Capillary: 120 mg/dL — ABNORMAL HIGH (ref 70–99)
Glucose-Capillary: 95 mg/dL (ref 70–99)

## 2014-01-18 MED ORDER — POTASSIUM CHLORIDE CRYS ER 20 MEQ PO TBCR
40.0000 meq | EXTENDED_RELEASE_TABLET | Freq: Once | ORAL | Status: AC
  Administered 2014-01-18: 40 meq via ORAL
  Filled 2014-01-18: qty 2

## 2014-01-18 NOTE — Consult Note (Signed)
I have reviewed this case with our NP and agree with the Assessment and Plan as stated.  Vandana Haman L. Tempest Frankland, MD MBA The Palliative Medicine Team at [place] Team Phone: [phone] Pager: [phone]   

## 2014-01-18 NOTE — Progress Notes (Addendum)
Patient was stable at transfer. Report given to Manuela Schwartz RN prior to transport. Patient assessment has not changed from am. Attempted to call brother Demonta Wombles, no answer. Left telephone message with niece [name] concerning patient transfer to Room 1520. No answer, left voice mail message.

## 2014-01-18 NOTE — Progress Notes (Signed)
Just received from ICU, agree with their am assesment.

## 2014-01-18 NOTE — Progress Notes (Signed)
CSW continuing to follow.  CSW reviewed chart and noted that PMT [place] meeting held and plan is for pt to return to [facility] recommend hospice services if eligible at the time.   CSW contacted [facility] and updated facility. Per facility, hospice can be consulted from facility upon pt return.  CSW to continue to follow and facilitate pt discharge needs when pt medically ready for discharge.  Alison Murray, MSW, [place] [facility] [phone]

## 2014-01-18 NOTE — Progress Notes (Signed)
Pt was alone and lying in bed watching tv when I arrived. Pt said he had not seen [place], (and mentioned other names) whom he said is is brother. After my introduction, pt asked a few minutes later who I was. He repeated names of persons he had not seen. Pt was glad for my visit. I will refer or ck later for family members to offer additional assistance. Ernest Haber Chaplain  01/18/14 1100  Clinical Encounter Type  Visited With Patient

## 2014-01-18 NOTE — Progress Notes (Signed)
Patient ID: [name], male   DOB: September 19, 1945, 68 y.o.   MRN: [id number]  [facility] PROGRESS NOTE  [name] RCV:[id number] DOB: [birth year] DOA: 01/08/2014 PCP: No primary provider on file.  Brief narrative: 68 y.o. male, resident of SNF, with history of stroke with residual right-sided hemiparesis (on Plavix and aspirin), dementia, BPH with chronic indwelling Foley catheter, HTN, CKD, GERD, severe ulcerative esophagitis by EGD March 2014 with biopsy showing nonspecific inflammation, recently hospitalized in February of 2015 for hematemesis who presented to [facility] ED 01/08/2014 with main concern of coffee-ground emesis.   In ED, pt's SBP was in 200's with HR in 130 - 140's. He was found to have UTI (which now we know it is secondary to Klebsiella and Proteus species). Pt initially on vanco and zosyn but currently only on Levaquin based on sensitivity report. Pt required protonix drip on admission. EGD showed distal ulcerative, erosive esophagitis (4/30 - done by Dr. Penelope Coop).   Assessment and Plan:  Principal Problem:  Sepsis secondary to UTI Klebsiella and Proteus species  - urine culture 4/25 grew klebsiella and proteus Both sensitive to Ciprofloxacin, Levaquin, Rocephin  - pt transitioned to oral Levaquin 4/29 and continued until 01/16/2014  Active Problems:  Coffee ground emesis, Acute blood loss anemia  - EGD with distal ulcerative, erosive esophagitis (4/30 - done by Dr. Penelope Coop)  - Hg/Hct remains overall stable, no signs of active bleeding  - continue Protonix BID  - continue ferrous sulfate supplementation  NSVT/Sustained VT  - with LBBB; appreciate cardio input; they agree that cardiac cath is not recommended at this time; they made changes to metoprolol, now 100 Mg PO BID  - Cardiomyopathy, probably non-ischemic, EF 30-35%  - high risk for sudden cardiac arrest  - PCT consulted, pt remains full code and we appreciate PCT input  - transfer to telemetry bed when bed  available  HTN, accelerated  - continue Hydralazine, Imdur and Metoprolol  - stopped Enalaprilat due to renal failure  Hemiparesis affecting right side as late effect of stroke  - PT once pt able to participate  BPH (benign prostatic hyperplasia)  - continue Tamsulosin  Chronic kidney disease, stage III (moderate)  - Cr now stabilized ~ 1.4 which is possibly new baseline - repeat BMP in AM Type II or unspecified type diabetes mellitus with renal manifestations, uncontrolled  - continue SSI for now until oral intake improves  - CBG's in past 24 hours in 100's  Hypokalemia  - continue to supplement  Decubitus ulcer of right buttock, stage 2  - wound care consulted  Moderate malnutrition  - advance diet as pt able to tolerate  - diet dysphagia III   Code Status: Full  Family Communication: brother over the phone  Disposition Plan: transfer to telemetry and possible d/c in 24 - 48 hours   Consultants:  ID  GI  Cardiology  Wound care  Procedures/Studies:  None Antibiotics:  Vancomycin 4/25 --> 4/28  Zosyn 4/25 --> 4/28  Maxipime 4/28 --> 4/29  Levaquin 4/29 --> 05/03  HPI/Subjective: No events overnight.   Objective: Filed Vitals:   01/18/14 0400 01/18/14 0800 01/18/14 0939 01/18/14 1200  BP: 154/76  160/111 138/72  Pulse: 67  95 79  Temp: 99 F (37.2 C) 97.8 F (36.6 C)  98 F (36.7 C)  TempSrc: Oral Oral  Oral  Resp: 14   21  Height:      Weight:      SpO2: 96%  96%    Intake/Output Summary (Last 24 hours) at 01/18/14 1609 Last data filed at 01/18/14 1500  Gross per 24 hour  Intake    784 ml  Output    950 ml  Net   -166 ml    Exam:   General:  Pt is alert, follows commands appropriately, not in acute distress  Cardiovascular: Regular rate and rhythm, S1/S2, no murmurs, no rubs, no gallops  Respiratory: Clear to auscultation bilaterally, diminished breath sounds at bases   Abdomen: Soft, non tender, non distended, bowel sounds present, no  guarding  Data Reviewed: Basic Metabolic Panel:  Recent Labs Lab 01/13/14 1823 01/14/14 0530 01/15/14 0530 01/16/14 0550 01/17/14 0525 01/18/14 0515  NA 143 138 144 144 143 140  K 4.4 4.0 4.2 4.2 4.1 3.6*  CL 109 105 110 110 109 107  CO2 22 22 24 25 27 24   GLUCOSE 154* 108* 104* 96 94 87  BUN 5* 6 9 10 9 10   CREATININE 1.34 1.33 1.56* 1.45* 1.41* 1.43*  CALCIUM 9.5 9.2 8.9 9.1 9.1 8.3*  MG 2.5  --   --   --   --   --   PHOS 2.2*  --   --   --   --   --    CBC:  Recent Labs Lab 01/14/14 0530 01/15/14 0530 01/16/14 0550 01/17/14 0525 01/18/14 0515  WBC 6.5 5.4 5.2 5.7 4.4  HGB 10.4* 9.2* 8.6* 8.1* 8.3*  HCT 32.0* 28.8* 27.1* 26.0* 26.4*  MCV 79.0 79.3 79.9 81.3 79.8  PLT 292 291 274 264 263   Cardiac Enzymes:  Recent Labs Lab 01/16/14 0150 01/16/14 0950 01/16/14 1745 01/17/14 0130 01/17/14 1015  TROPONINI <0.30 <0.30 <0.30 <0.30 <0.30   CBG:  Recent Labs Lab 01/17/14 0746 01/17/14 1152 01/17/14 1658 01/17/14 2139 01/18/14 0759  GLUCAP 89 107* 95 94 78    Recent Results (from the past 240 hour(s))  MRSA PCR SCREENING     Status: None   Collection Time    01/08/14  8:10 PM      Result Value Ref Range Status   MRSA by PCR NEGATIVE  NEGATIVE Final   Comment:            The GeneXpert MRSA Assay (FDA     approved for NASAL specimens     only), is one component of a     comprehensive MRSA colonization     surveillance program. It is not     intended to diagnose MRSA     infection nor to guide or     monitor treatment for     MRSA infections.  URINE CULTURE     Status: None   Collection Time    01/08/14  9:38 PM      Result Value Ref Range Status   Specimen Description URINE, RANDOM   Final   Special Requests NONE   Final   Culture  Setup Time     Final   Value: 01/09/2014 03:53     Performed at [place]     Final   Value: 50,000 COLONIES/ML     Performed at [facility]   Culture     Final   Value:  Multiple bacterial morphotypes present, none predominant. Suggest appropriate recollection if clinically indicated.     Performed at [facility]   Report Status 01/11/2014 FINAL   Final     Scheduled Meds: . amiodarone  200 mg Oral BID  . feeding supplement (ENSURE COMPLETE)  237 mL Oral BID BM  . ferrous sulfate  325 mg Oral BID WC  . hydrALAZINE  10 mg Oral 3 times per day  . insulin aspart  0-9 Units Subcutaneous TID WC  . isosorbide-hydrALAZINE  1 tablet Oral BID  . metoCLOPramide (REGLAN) injection  5 mg Intravenous 4 times per day  . metoprolol tartrate  100 mg Oral BID  . pantoprazole (PROTONIX) IV  40 mg Intravenous Q12H  . sodium chloride  10-40 mL Intracatheter Q12H  . sodium chloride  3 mL Intravenous Q12H  . tamsulosin  0.4 mg Oral Daily   Continuous Infusions:    Theodis Blaze, MD  [facility] Pager [phone]  If 7PM-7AM, please contact night-coverage [email or web address] Password [email or web address] 01/18/2014, 4:09 PM   LOS: 10 days

## 2014-01-18 NOTE — Progress Notes (Signed)
[id number] Rosana Hoes, RN, BSN, CCM  [phone]  Chart Reviewed for discharge and hospital needs.  Discharge needs at time of review: None present will follow for needs. Patient is from [facility] snf and expected to return, patient is not able to sign for himself  Review of patient progress due on [id number].

## 2014-01-19 LAB — BASIC METABOLIC PANEL
BUN: 13 mg/dL (ref 6–23)
CO2: 25 mEq/L (ref 19–32)
CREATININE: 1.37 mg/dL — AB (ref 0.50–1.35)
Calcium: 9.3 mg/dL (ref 8.4–10.5)
Chloride: 105 mEq/L (ref 96–112)
GFR calc non Af Amer: 51 mL/min — ABNORMAL LOW (ref >90)
GFR, EST AFRICAN AMERICAN: 60 mL/min — AB (ref >90)
Glucose, Bld: 85 mg/dL (ref 70–99)
Potassium: 4.4 mEq/L (ref 3.7–5.3)
Sodium: 138 mEq/L (ref 137–147)

## 2014-01-19 LAB — CBC
HCT: 29.4 % — ABNORMAL LOW (ref 39.0–52.0)
Hemoglobin: 9.1 g/dL — ABNORMAL LOW (ref 13.0–17.0)
MCH: 24.5 pg — AB (ref 26.0–34.0)
MCHC: 31 g/dL (ref 30.0–36.0)
MCV: 79.2 fL (ref 78.0–100.0)
Platelets: 322 10*3/uL (ref 150–400)
RBC: 3.71 MIL/uL — ABNORMAL LOW (ref 4.22–5.81)
RDW: 15.2 % (ref 11.5–15.5)
WBC: 5.3 10*3/uL (ref 4.0–10.5)

## 2014-01-19 LAB — GLUCOSE, CAPILLARY
Glucose-Capillary: 93 mg/dL (ref 70–99)
Glucose-Capillary: 120 mg/dL — ABNORMAL HIGH (ref 70–99)
Glucose-Capillary: 120 mg/dL — ABNORMAL HIGH (ref 70–99)
Glucose-Capillary: 108 mg/dL — ABNORMAL HIGH (ref 70–99)

## 2014-01-19 MED ORDER — ISOSORB DINITRATE-HYDRALAZINE 20-37.5 MG PO TABS
1.0000 | ORAL_TABLET | Freq: Three times a day (TID) | ORAL | Status: DC
  Administered 2014-01-19 – 2014-01-20 (×3): 1 via ORAL
  Filled 2014-01-19 (×5): qty 1

## 2014-01-19 NOTE — Progress Notes (Signed)
Patient ID: [name], male   DOB: 1946-06-03, 68 y.o.   MRN: [id number]  [facility] PROGRESS NOTE  [name] JJO:[id number] DOB: 16-Mar-1946 DOA: 01/08/2014  PCP: Physician at SNF  Brief narrative: 68 y.o. male, resident of SNF, with history of stroke with residual right-sided hemiparesis (on Plavix and aspirin), dementia, BPH with chronic indwelling Foley catheter, HTN, CKD, GERD, severe ulcerative esophagitis by EGD March 2014 with biopsy showing nonspecific inflammation, recently hospitalized in February of 2015 for hematemesis who presented to [facility] ED 01/08/2014 with main concern of coffee-ground emesis.   In ED, pt's SBP was in 200's with HR in 130 - 140's. He was found to have UTI (which now we know it is secondary to Klebsiella and Proteus species). Pt initially on vanco and zosyn but currently only on Levaquin based on sensitivity report. Pt required protonix drip on admission. EGD showed distal ulcerative, erosive esophagitis (4/30 - done by Dr. Penelope Coop).   Assessment and Plan:  Sepsis secondary to UTI Klebsiella and Proteus species  - urine culture 4/25 grew klebsiella and proteus Both sensitive to Ciprofloxacin, Levaquin, Rocephin  - pt transitioned to oral Levaquin 4/29 and continued until 01/16/2014   Coffee ground emesis, Acute blood loss anemia  - EGD with distal ulcerative, erosive esophagitis (4/30 - done by Dr. Penelope Coop)  - Hg/Hct remains overall stable, no signs of active bleeding  - continue Protonix BID  - continue ferrous sulfate supplementation   NSVT/Sustained VT  - with LBBB; appreciate cardio input; they agree that cardiac cath is not recommended at this time; they made changes to metoprolol, now 100 Mg PO BID. On Amiodarone as well. Not candidate for ICD. - Cardiomyopathy, probably non-ischemic, EF 30-35%. Can consider low dose lasix as OP. But patient appears euvolemic currently. No ACEI as creatinine is high. He is on Bidil.  - high risk for sudden  cardiac arrest  - Palliative Care consulted and discussed with patient's brother, pt remains full code and we appreciate their input   HTN, accelerated  - continue Hydralazine, Imdur and Metoprolol  - stopped Enalaprilat due to renal failure   Hemiparesis affecting right side as late effect of stroke  - PT once pt able to participate   BPH (benign prostatic hyperplasia)  - continue Tamsulosin   Chronic kidney disease, stage III (moderate)  - Cr now stabilized ~ 1.4 which is possibly new baseline - repeat BMP in AM  Type II or unspecified type diabetes mellitus with renal manifestations, uncontrolled  - continue SSI for now until oral intake improves  - CBG's in past 24 hours in 100's   Hypokalemia  - continue to supplement. Repleted.  Decubitus ulcer of right buttock, stage 2  - wound care consulted   Moderate malnutrition  - advance diet as pt able to tolerate  - diet dysphagia III   Code Status: Full  Family Communication: Brother over the phone. Disposition Plan: SNF 5/7   Consultants:  ID  GI  Cardiology  Wound care   Procedures/Studies:  None  Antibiotics:  Vancomycin 4/25 --> 4/28  Zosyn 4/25 --> 4/28  Maxipime 4/28 --> 4/29  Levaquin 4/29 --> 05/03  HPI/Subjective: No events overnight.   Objective: Filed Vitals:   01/18/14 1600 01/18/14 2127 01/19/14 0413 01/19/14 1056  BP:  156/78 139/84 144/85  Pulse:  88 84 84  Temp: 98.8 F (37.1 C) 98 F (36.7 C) 98.1 F (36.7 C)   TempSrc: Oral Oral Oral   Resp:  16 20   Height:      Weight:   84.2 kg (185 lb 10 oz)   SpO2:  100% 100%     Intake/Output Summary (Last 24 hours) at 01/19/14 1426 Last data filed at 01/19/14 0918  Gross per 24 hour  Intake    297 ml  Output   1325 ml  Net  -1028 ml    Exam:   General:  Pt is alert, follows commands appropriately, not in acute distress  Cardiovascular: Regular rate and rhythm, S1/S2, no murmurs, no rubs, no gallops  Respiratory: Clear to  auscultation bilaterally,    Abdomen: Soft, non tender, non distended, bowel sounds present, no guarding  Neuro: Old right hemiparesis  Data Reviewed: Basic Metabolic Panel:  Recent Labs Lab 01/13/14 1823  01/15/14 0530 01/16/14 0550 01/17/14 0525 01/18/14 0515 01/19/14 0459  NA 143  < > 144 144 143 140 138  K 4.4  < > 4.2 4.2 4.1 3.6* 4.4  CL 109  < > 110 110 109 107 105  CO2 22  < > 24 25 27 24 25   GLUCOSE 154*  < > 104* 96 94 87 85  BUN 5*  < > 9 10 9 10 13   CREATININE 1.34  < > 1.56* 1.45* 1.41* 1.43* 1.37*  CALCIUM 9.5  < > 8.9 9.1 9.1 8.3* 9.3  MG 2.5  --   --   --   --   --   --   PHOS 2.2*  --   --   --   --   --   --   < > = values in this interval not displayed. CBC:  Recent Labs Lab 01/15/14 0530 01/16/14 0550 01/17/14 0525 01/18/14 0515 01/19/14 0459  WBC 5.4 5.2 5.7 4.4 5.3  HGB 9.2* 8.6* 8.1* 8.3* 9.1*  HCT 28.8* 27.1* 26.0* 26.4* 29.4*  MCV 79.3 79.9 81.3 79.8 79.2  PLT 291 274 264 263 322   Cardiac Enzymes:  Recent Labs Lab 01/16/14 0150 01/16/14 0950 01/16/14 1745 01/17/14 0130 01/17/14 1015  TROPONINI <0.30 <0.30 <0.30 <0.30 <0.30   CBG:  Recent Labs Lab 01/18/14 1156 01/18/14 1729 01/18/14 2124 01/19/14 0746 01/19/14 1157  GLUCAP 120* 84 95 93 120*     Scheduled Meds: . amiodarone  200 mg Oral BID  . feeding supplement (ENSURE COMPLETE)  237 mL Oral BID BM  . ferrous sulfate  325 mg Oral BID WC  . hydrALAZINE  10 mg Oral 3 times per day  . insulin aspart  0-9 Units Subcutaneous TID WC  . isosorbide-hydrALAZINE  1 tablet Oral BID  . metoCLOPramide (REGLAN) injection  5 mg Intravenous 4 times per day  . metoprolol tartrate  100 mg Oral BID  . pantoprazole (PROTONIX) IV  40 mg Intravenous Q12H  . sodium chloride  10-40 mL Intracatheter Q12H  . sodium chloride  3 mL Intravenous Q12H  . tamsulosin  0.4 mg Oral Daily   Continuous Infusions:    Bonnielee Haff, MD  [facility] Pager [phone]  If 7PM-7AM, please contact  night-coverage [email or web address] Password [email or web address] 01/19/2014, 2:26 PM   LOS: 11 days

## 2014-01-19 NOTE — Progress Notes (Signed)
NUTRITION FOLLOW UP  Intervention:   - Diet advancement per MD, unclear why pt on clear liquids - Ensure Complete BID ordered, recommended continuing supplements - Recommend MD re-check phosphorus and magnesium   - Will continue to monitor   Nutrition Dx:   Swallowing difficulty related to possibly from hx of CVA as evidenced by SLP evaluation - improved   Goal:   Pt able to safely consume >90% of meals/supplements - not met, pt only only clear liquid diet   Monitor:   Weights, labs, diet advancement, goals of care   Assessment:   Pt with history of stroke with right-sided hemiparesis, dementia, BPH with chronic indwelling Foley catheter, hypertension, chronic kidney disease, gastroesophageal reflux disease, history of severe ulcerative esophagitis who was hospitalized in February of 2015 for hematemesis, presented to the ED with several episodes of coffee-ground emesis. Started 01/08/14. GI following and plan is for upper endoscopy today.   4/29 - Met with pt who reports eating well PTA, 2 meals/day. No documentation of nutritional supplements on medication list PTA. Seen by SLP 4/27 who noted pt with multifactorial dysphagia with recommendations for thin liquids. Per SLP, pt was on a regular/thin liquid diet at SNF however RN reported pt choked on liquids 4/27. Per conversation with RN, pt ate some burger and fries last night and then had projectile vomiting. RN reports when pt is given medications, they will sit on his tongue for awhile before he eventually swallows them, denies any pocketing behavior. Weight stable PTA.   5/6 - SLP signed off 4/29 and recommended mechanical soft/ground meat diet with thin liquids and noted pt with "adequate oropharyngeal swallowing ability for diet". Had EGD 4/30 that showed no evidence of active bleeding with distal ulcerative, erosive esophagitis. Had sudden wide complex tachycardia 4/30, was given magnesium sulfate and phosphorus replacement. Palliative  care team met with pt and brother yesterday - plan is full code and to continue all available and offered medical interventions to prolong life. Met with pt who reports he has been consuming all of clear liquids on tray without any nausea. PO intake this morning documented as 10%. Provided pt with Sprite as he requested a soda.   Potassium WNL, got replacement yesterday Phosphorus low last week, not re-ordered Magnesium from last week WNL   Height: Ht Readings from Last 1 Encounters:  01/09/14 6' 0.05" (1.83 m)    Weight Status:   Wt Readings from Last 1 Encounters:  01/19/14 185 lb 10 oz (84.2 kg)  Admit wt          185 lb 3 oz (84 kg)  Re-estimated needs:  Kcal: 2150-2350  Protein: 115-130g  Fluid: 2.1-2.3L/day   Skin: stage 2 right buttocks pressure ulcer healed per WOC RN   Diet Order: Clear Liquid   Intake/Output Summary (Last 24 hours) at 01/19/14 0948 Last data filed at 01/19/14 0918  Gross per 24 hour  Intake    357 ml  Output   1825 ml  Net  -1468 ml    Last BM: 5/4   Labs:   Recent Labs Lab 01/13/14 1823  01/17/14 0525 01/18/14 0515 01/19/14 0459  NA 143  < > 143 140 138  K 4.4  < > 4.1 3.6* 4.4  CL 109  < > 109 107 105  CO2 22  < > _0 BUN 5*  < > _1 CREATININE 1.34  < > 1.41* 1.43* 1.37*  CALCIUM 9.5  < >  9.1 8.3* 9.3  MG 2.5  --   --   --   --   PHOS 2.2*  --   --   --   --   GLUCOSE 154*  < > 94 87 85  < > = values in this interval not displayed.  CBG (last 3)   Recent Labs  01/18/14 1729 01/18/14 2124 01/19/14 0746  GLUCAP 84 95 93    Scheduled Meds: . amiodarone  200 mg Oral BID  . feeding supplement (ENSURE COMPLETE)  237 mL Oral BID BM  . ferrous sulfate  325 mg Oral BID WC  . hydrALAZINE  10 mg Oral 3 times per day  . insulin aspart  0-9 Units Subcutaneous TID WC  . isosorbide-hydrALAZINE  1 tablet Oral BID  . metoCLOPramide (REGLAN) injection  5 mg Intravenous 4 times per day  . metoprolol tartrate  100 mg  Oral BID  . pantoprazole (PROTONIX) IV  40 mg Intravenous Q12H  . sodium chloride  10-40 mL Intracatheter Q12H  . sodium chloride  3 mL Intravenous Q12H  . tamsulosin  0.4 mg Oral Daily     Mikey College MS, RD, LDN [phone] Pager [phone] After Hours Pager

## 2014-01-20 LAB — BASIC METABOLIC PANEL
BUN: 12 mg/dL (ref 6–23)
CALCIUM: 9.3 mg/dL (ref 8.4–10.5)
CO2: 26 mEq/L (ref 19–32)
Chloride: 104 mEq/L (ref 96–112)
Creatinine, Ser: 1.39 mg/dL — ABNORMAL HIGH (ref 0.50–1.35)
GFR calc Af Amer: 59 mL/min — ABNORMAL LOW (ref >90)
GFR calc non Af Amer: 51 mL/min — ABNORMAL LOW (ref >90)
GLUCOSE: 88 mg/dL (ref 70–99)
Potassium: 4.1 mEq/L (ref 3.7–5.3)
Sodium: 140 mEq/L (ref 137–147)

## 2014-01-20 LAB — CBC
HEMATOCRIT: 22.8 % — AB (ref 39.0–52.0)
Hemoglobin: 7.2 g/dL — ABNORMAL LOW (ref 13.0–17.0)
MCH: 25 pg — AB (ref 26.0–34.0)
MCHC: 31.6 g/dL (ref 30.0–36.0)
MCV: 79.2 fL (ref 78.0–100.0)
PLATELETS: 360 10*3/uL (ref 150–400)
RBC: 2.88 MIL/uL — AB (ref 4.22–5.81)
RDW: 15.2 % (ref 11.5–15.5)
WBC: 4.5 10*3/uL (ref 4.0–10.5)

## 2014-01-20 LAB — HEMOGLOBIN AND HEMATOCRIT, BLOOD
HCT: 31.1 % — ABNORMAL LOW (ref 39.0–52.0)
HEMOGLOBIN: 9.8 g/dL — AB (ref 13.0–17.0)

## 2014-01-20 LAB — GLUCOSE, CAPILLARY
Glucose-Capillary: 95 mg/dL (ref 70–99)
Glucose-Capillary: 100 mg/dL — ABNORMAL HIGH (ref 70–99)

## 2014-01-20 MED ORDER — CLOPIDOGREL BISULFATE 75 MG PO TABS: 75.0000 mg | ORAL_TABLET | Freq: Every day | ORAL | Status: AC

## 2014-01-20 MED ORDER — ASPIRIN 81 MG PO TABS: 81.0000 mg | ORAL_TABLET | Freq: Every day | ORAL | Status: DC

## 2014-01-20 MED ORDER — ISOSORB DINITRATE-HYDRALAZINE 20-37.5 MG PO TABS: 1.0000 | ORAL_TABLET | Freq: Three times a day (TID) | ORAL | Status: AC

## 2014-01-20 MED ORDER — AMIODARONE HCL 200 MG PO TABS: 200.0000 mg | ORAL_TABLET | Freq: Two times a day (BID) | ORAL | Status: AC

## 2014-01-20 MED ORDER — ENSURE COMPLETE PO LIQD: 237.0000 mL | Freq: Two times a day (BID) | ORAL | Status: DC

## 2014-01-20 MED ORDER — CLONIDINE HCL 0.2 MG PO TABS: 0.1000 mg | ORAL_TABLET | Freq: Two times a day (BID) | ORAL | Status: DC | PRN

## 2014-01-20 NOTE — Progress Notes (Signed)
R CVC D/C per MD order. Pressure x62min/no bleeding/vaseline pressure dsg applied. Lorri Frederick, RN

## 2014-01-20 NOTE — Discharge Instructions (Signed)
Esophagitis  Esophagitis is inflammation of the esophagus. It can involve swelling, soreness, and pain in the esophagus. This condition can make it difficult and painful to swallow.  CAUSES   Most causes of esophagitis are not serious. Many different factors can cause esophagitis, including:   Gastroesophageal reflux disease (GERD). This is when acid from your stomach flows up into the esophagus.   Recurrent vomiting.   An allergic-type reaction.   Certain medicines, especially those that come in large pills.   Ingestion of harmful chemicals, such as household cleaning products.   Heavy alcohol use.   An infection of the esophagus.   Radiation treatment for cancer.   Certain diseases such as sarcoidosis, Crohn's disease, and scleroderma. These diseases may cause recurrent esophagitis.  SYMPTOMS    Trouble swallowing.   Painful swallowing.   Chest pain.   Difficulty breathing.   Nausea.   Vomiting.   Abdominal pain.  DIAGNOSIS   Your caregiver will take your history and do a physical exam. Depending upon what your caregiver finds, certain tests may also be done, including:   Barium X-ray. You will drink a solution that coats the esophagus, and X-rays will be taken.   Endoscopy. A lighted tube is put down the esophagus so your caregiver can examine the area.   Allergy tests. These can sometimes be arranged through follow-up visits.  TREATMENT   Treatment will depend on the cause of your esophagitis. In some cases, steroids or other medicines may be given to help relieve your symptoms or to treat the underlying cause of your condition. Medicines that may be recommended include:   Viscous lidocaine, to soothe the esophagus.   Antacids.   Acid reducers.   Proton pump inhibitors.   Antiviral medicines for certain viral infections of the esophagus.   Antifungal medicines for certain fungal infections of the esophagus.   Antibiotic medicines, depending on the cause of the esophagitis.  HOME CARE  INSTRUCTIONS    Avoid foods and drinks that seem to make your symptoms worse.   Eat small, frequent meals instead of large meals.   Avoid eating for the 3 hours prior to your bedtime.   If you have trouble taking pills, use a pill splitter to decrease the size and likelihood of the pill getting stuck or injuring the esophagus on the way down. Drinking water after taking a pill also helps.   Stop smoking if you smoke.   Maintain a healthy weight.   Wear loose-fitting clothing. Do not wear anything tight around your waist that causes pressure on your stomach.   Raise the head of your bed 6 to 8 inches with wood blocks to help you sleep. Extra pillows will not help.   Only take over-the-counter or prescription medicines as directed by your caregiver.  SEEK IMMEDIATE MEDICAL CARE IF:   You have severe chest pain that radiates into your arm, neck, or jaw.   You feel sweaty, dizzy, or lightheaded.   You have shortness of breath.   You vomit blood.   You have difficulty or pain with swallowing.   You have bloody or black, tarry stools.   You have a fever.   You have a burning sensation in the chest more than 3 times a week for more than 2 weeks.   You cannot swallow, drink, or eat.   You drool because you cannot swallow your saliva.  MAKE SURE YOU:   Understand these instructions.   Will watch your condition.     Will get help right away if you are not doing well or get worse.  Document Released: 10/10/2004 Document Revised: 11/25/2011 Document Reviewed: 05/03/2011  [employer] Patient Information 2014 [employer].

## 2014-01-20 NOTE — Progress Notes (Signed)
Patient is set to discharge back to [facility] SNF today. Patient's sister-in-law aware. Discharge packet in [place] aware. [facility] called for transport pickup (Service Request Id: [id number]).   Raynaldo Opitz, [facility] Clinical Social Worker cell #: [phone]

## 2014-01-20 NOTE — Discharge Summary (Signed)
[facility]  Physician Discharge Summary   Patient ID: [name] MRN: [id number] DOB/AGE: April 13, 1946 68 y.o.  Admit date: 01/08/2014 Discharge date: 01/20/2014  PCP: Ezequiel Kayser, MD  DISCHARGE DIAGNOSES:  Principal Problem:   Sepsis Active Problems:   Dementia   Stroke   GERD (gastroesophageal reflux disease)   UGIB (upper gastrointestinal bleed)   Hemiparesis affecting right side as late effect of stroke   Expressive aphasia   Hypertension, accelerated   BPH (benign prostatic hyperplasia)   Dyslipidemia   Depression   Anemia in chronic kidney disease(285.21)   Chronic kidney disease, stage III (moderate)   Type II or unspecified type diabetes mellitus with renal manifestations, uncontrolled   Tachycardia   Cardiomyopathy: Per 2 D echo 10/22/13 EF 30-35%   Coffee ground emesis   Hypokalemia   Decubitus ulcer of right buttock, stage 2   UTI (urinary tract infection)   Bacteremia   Acute blood loss anemia   Dysphagia, unspecified(787.20)   NSVT (nonsustained ventricular tachycardia)   Palliative care encounter   Weakness generalized   RECOMMENDATIONS FOR OUTPATIENT FOLLOW UP: 1. Resume Anti-platelet agents on 5/15. 2. Changes made to his anti-hypertensive medications.  DISCHARGE CONDITION: fair  Diet recommendation: Dys 3 diet  Filed Weights   01/17/14 0400 01/19/14 0413 01/19/14 2245  Weight: 87.1 kg (192 lb 0.3 oz) 84.2 kg (185 lb 10 oz) 83.87 kg (184 lb 14.4 oz)    INITIAL HISTORY: 68 y.o. male, resident of SNF, with history of stroke with residual right-sided hemiparesis (on Plavix and aspirin), dementia, BPH with chronic indwelling Foley catheter, HTN, CKD, GERD, severe ulcerative esophagitis by EGD March 2014 with biopsy showing nonspecific inflammation, recently hospitalized in February of 2015 for hematemesis who presented to [facility] ED 01/08/2014 with main concern of coffee-ground emesis. In ED, pt's SBP was in 200's with HR in 130 - 140's.  He was found to have UTI (which now we know it is secondary to Klebsiella and Proteus species). Pt initially on vanco and zosyn but currently only on Levaquin based on sensitivity report. Pt required protonix drip on admission. EGD showed distal ulcerative, erosive esophagitis (4/30 - done by Dr. Penelope Coop).   Consultants:  ID  GI  Cardiology  Wound care   Procedures/Studies:  EGD  Antibiotics:  Vancomycin 4/25 --> 4/28  Zosyn 4/25 --> 4/28  Maxipime 4/28 --> 4/29  Levaquin 4/29 --> 05/03   HOSPITAL COURSE:   Sepsis secondary to UTI Klebsiella and Proteus species  Urine culture from 4/25 grew klebsiella and proteus Both sensitive to Ciprofloxacin, Levaquin, Rocephin. Patient was transitioned to oral Levaquin 4/29 and continued until 01/16/2014. He has completed treatment.  Coffee ground emesis, Acute blood loss anemia  EGD was done and showed distal ulcerative, erosive esophagitis (4/30 - done by Dr. Penelope Coop). Hg/Hct remains overall stable, no signs of active bleeding. He should continue Protonix BID. Continue ferrous sulfate supplementation. Will hold antiplatelet agents till 5/15.  NSVT/Sustained VT/Cardiomyopathy He was noted to have NSVT with LBBB. He remained asymptomatic. Cardiology was consulted. He underwent ECHO in February which showed a EF of 30%. Cardiolgoy felt that patient was not candidate for aggressive intervention including ICD. They added Amiodarone and increased Metoprolol.  He was also started on Bidil for his cardiomyopathy. Patient remains at risk for sudden cardiac arrest. Palliative Medicine was consulted. They discussed with patient's brother, pt remains full code and we appreciate their input.  HTN, accelerated  Continue current medications. Stopped Enalaprilat due to renal failure. Stopped  Amodipine due to addition of Bidil and as BP is well controlled. Clonidine was stopped at admission. Patient may have experienced some rebound HTN but BP is now better. Will leave  on Clonidine on PRN basis. If BP remains significantly elevated at SNF, this may have to be restarted on scheduled basis or dose of Bidil can be increased.  Hemiparesis affecting right side as late effect of stroke  At baseline. Dysphagia 3 diet based on SLP evaluation.  BPH (benign prostatic hyperplasia)  Continue Tamsulosin   Chronic kidney disease, stage III (moderate)  Cr now stabilized ~ 1.4 which is possibly new baseline.   Type II or unspecified type diabetes mellitus with renal manifestations, uncontrolled  But not on medications at SNF. Monitor at SNF. CBG's here have been well controlled without long acting medications.  Decubitus ulcer of right buttock, stage 2  Wound care consulted. Follow decube care.  Moderate malnutrition  dysphagia III diet. Ensure twice daily.  Patient stable and ready to go back to SNF. Discussed with his brother yesterday.   PERTINENT LABS: The results of significant diagnostics from this hospitalization (including imaging, microbiology, ancillary and laboratory) are listed below for reference.    Microbiology: Urine culture 4/25: Klebsiella and proteus mirabilis.  Labs: Basic Metabolic Panel:  Recent Labs Lab 01/13/14 1823  01/16/14 0550 01/17/14 0525 01/18/14 0515 01/19/14 0459 01/20/14 0515  NA 143  < > 144 143 140 138 140  K 4.4  < > 4.2 4.1 3.6* 4.4 4.1  CL 109  < > 110 109 107 105 104  CO2 22  < > 25 27 24 25 26   GLUCOSE 154*  < > 96 94 87 85 88  BUN 5*  < > 10 9 10 13 12   CREATININE 1.34  < > 1.45* 1.41* 1.43* 1.37* 1.39*  CALCIUM 9.5  < > 9.1 9.1 8.3* 9.3 9.3  MG 2.5  --   --   --   --   --   --   PHOS 2.2*  --   --   --   --   --   --   < > = values in this interval not displayed.  CBC:  Recent Labs Lab 01/16/14 0550 01/17/14 0525 01/18/14 0515 01/19/14 0459 01/20/14 0515 01/20/14 0823  WBC 5.2 5.7 4.4 5.3 4.5  --   HGB 8.6* 8.1* 8.3* 9.1* 7.2* 9.8*  HCT 27.1* 26.0* 26.4* 29.4* 22.8* 31.1*  MCV 79.9 81.3  79.8 79.2 79.2  --   PLT 274 264 263 322 360  --    Cardiac Enzymes:  Recent Labs Lab 01/16/14 0150 01/16/14 0950 01/16/14 1745 01/17/14 0130 01/17/14 1015  TROPONINI <0.30 <0.30 <0.30 <0.30 <0.30   BNP: BNP (last 3 results)  Recent Labs  10/20/13 1031  PROBNP 5795.0*   CBG:  Recent Labs Lab 01/19/14 0746 01/19/14 1157 01/19/14 1712 01/19/14 2217 01/20/14 0738  GLUCAP 93 120* 120* 108* 95     IMAGING STUDIES Dg Chest Portable 1 View  01/08/2014   CLINICAL DATA:  Hematemesis, fever, central line placement  EXAM: PORTABLE CHEST - 1 VIEW  COMPARISON:  DG CHEST 1V PORT dated 10/20/2013  FINDINGS: Interval placement of a right central venous line with tip in the distal SVC. Cardiac silhouette is enlarged. There are low lung volumes. No pneumothorax.  IMPRESSION: Right central venous line in good position.   Electronically Signed   By: Suzy Bouchard M.D.   On: 01/08/2014 16:09  DISCHARGE EXAMINATION: Filed Vitals:   01/19/14 1056 01/19/14 1451 01/19/14 2245 01/20/14 0430  BP: 144/85 136/84 129/79 123/75  Pulse: 84 79 71 69  Temp:  97.7 F (36.5 C) 98.1 F (36.7 C) 98.6 F (37 C)  TempSrc:  Oral Oral Oral  Resp:  20 18 18   Height:      Weight:   83.87 kg (184 lb 14.4 oz)   SpO2:  100% 100% 100%   General appearance: alert, cooperative, appears stated age and no distress Resp: clear to auscultation bilaterally Cardio: regular rate and rhythm, S1, S2 normal, no murmur, click, rub or gallop GI: soft, non-tender; bowel sounds normal; no masses,  no organomegaly Neurologic: Old right hemiparesis  DISPOSITION: SNF  Discharge Orders   Future Orders Complete By Expires   Discharge diet:  As directed    Discharge instructions  As directed    Increase activity slowly  As directed       ALLERGIES: No Known Allergies  Current Discharge Medication List    START taking these medications   Details  amiodarone (PACERONE) 200 MG tablet Take 1 tablet (200 mg  total) by mouth 2 (two) times daily.    feeding supplement, ENSURE COMPLETE, (ENSURE COMPLETE) LIQD Take 237 mLs by mouth 2 (two) times daily between meals.    isosorbide-hydrALAZINE (BIDIL) 20-37.5 MG per tablet Take 1 tablet by mouth 3 (three) times daily.      CONTINUE these medications which have CHANGED   Details  aspirin 81 MG tablet Take 1 tablet (81 mg total) by mouth daily. PLEASE RESUME ON 01/28/14 Qty: 30 tablet    cloNIDine (CATAPRES) 0.2 MG tablet Take 0.5 tablets (0.1 mg total) by mouth 2 (two) times daily as needed. FOR SBP GREATER THAN 160. IF BP REMAINS ELEVATED CONSISTENTLY, CONSIDER SCHEDULED DOSING.    clopidogrel (PLAVIX) 75 MG tablet Take 1 tablet (75 mg total) by mouth daily with breakfast. PLEASE RESUME ON 01/28/14      CONTINUE these medications which have NOT CHANGED   Details  atorvastatin (LIPITOR) 20 MG tablet Take 1 tablet (20 mg total) by mouth daily at 6 PM. Qty: 30 tablet, Refills: 0    docusate sodium (COLACE) 100 MG capsule Take 100 mg by mouth 2 (two) times daily.    ferrous sulfate 325 (65 FE) MG tablet Take 325 mg by mouth 2 (two) times daily with a meal.    lactulose (CHRONULAC) 10 GM/15ML solution Take 30 mLs by mouth 2 (two) times daily.    memantine (NAMENDA) 10 MG tablet Take 10 mg by mouth 2 (two) times daily.    metoCLOPramide (REGLAN) 10 MG tablet Take 1 tablet (10 mg total) by mouth 4 (four) times daily -  before meals and at bedtime.    metoprolol succinate (TOPROL-XL) 200 MG 24 hr tablet Take 1 tablet (200 mg total) by mouth daily. Take with or immediately following a meal. Qty: 30 tablet, Refills: 0    pantoprazole (PROTONIX) 40 MG tablet Take 1 tablet (40 mg total) by mouth 2 (two) times daily before a meal.    polyethylene glycol (MIRALAX / GLYCOLAX) packet Take 17 g by mouth daily. Qty: 14 each, Refills: 0    Polyvinyl Alcohol-Povidone (FRESHKOTE OP) Place 1 drop into both eyes 4 (four) times daily.    tamsulosin (FLOMAX)  0.4 MG CAPS Take 0.4 mg by mouth daily.    venlafaxine XR (EFFEXOR-XR) 75 MG 24 hr capsule Take 75 mg by mouth daily with breakfast.  STOP taking these medications     amLODipine (NORVASC) 10 MG tablet      hydrALAZINE (APRESOLINE) 50 MG tablet        Follow-up Information   Follow up with DASANAYAKA,GAYANI, MD. Schedule an appointment as soon as possible for a visit in 1 week.   Specialty:  Internal Medicine   Contact information:   [place] [id number] [phone]       TOTAL DISCHARGE TIME: 35 mins  Bonnielee Haff  [facility] Pager [phone]  01/20/2014, 10:13 AM

## 2014-01-24 ENCOUNTER — Non-Acute Institutional Stay (SKILLED_NURSING_FACILITY): Payer: Medicare Other | Admitting: Internal Medicine

## 2014-01-24 DIAGNOSIS — N400 Enlarged prostate without lower urinary tract symptoms: Secondary | ICD-10-CM

## 2014-01-24 DIAGNOSIS — K208 Other esophagitis without bleeding: Secondary | ICD-10-CM

## 2014-01-24 DIAGNOSIS — I6350 Cerebral infarction due to unspecified occlusion or stenosis of unspecified cerebral artery: Secondary | ICD-10-CM

## 2014-01-24 DIAGNOSIS — I428 Other cardiomyopathies: Secondary | ICD-10-CM

## 2014-01-24 DIAGNOSIS — I429 Cardiomyopathy, unspecified: Secondary | ICD-10-CM

## 2014-01-24 DIAGNOSIS — K2210 Ulcer of esophagus without bleeding: Secondary | ICD-10-CM | POA: Insufficient documentation

## 2014-01-24 DIAGNOSIS — I639 Cerebral infarction, unspecified: Secondary | ICD-10-CM

## 2014-01-24 NOTE — Progress Notes (Signed)
HISTORY & PHYSICAL  DATE: 01/24/2014   FACILITY: [facility]  LEVEL OF CARE: SNF (31)  ALLERGIES:  No Known Allergies  CHIEF COMPLAINT:  Manage erosive esophagitis, cardiomyopathy and CVA  HISTORY OF PRESENT ILLNESS: Patient is a 68 year old African American male was hospitalized secondary to sepsis from UTI and coffee-ground emesis. He is readmitted back to the facility for long-term care management. He is a poor historian.  EROSIVE ESOPHAGITIS: due to coffee-ground emesis patient had an EGD done which showed distal ulcerated erosive esophagitis. Patient is on Protonix twice a day and tolerates it without any side effects. No further coffee ground emesis.  CARDIOMYOPATHY: The patient's cardiomyopathy remains stable. Staff deny increasing lower extremity swelling, shortness of breath, chest pain, palpitations, orthopnea or PNDs. No complications reported from the medications currently being used. EF 30%. Cardiology felt that he is not a candidate for aggressive intervention including ICD.  CVA: The patient's CVA remains stable.  Staff deny new neurologic symptoms such as numbness, tingling, weakness, speech difficulties or visual disturbances.  No complications reported from the medications currently being used. He has right-sided hemiparesis and dysphagia.  PAST MEDICAL HISTORY :  Past Medical History  Diagnosis Date  . Dementia   . Stroke   . Hydronephrosis   . Hypertension   . Hyperlipemia   . GERD (gastroesophageal reflux disease)   . BPH (benign prostatic hyperplasia)   . Cardiomyopathy: Per 2 D echo 10/22/13 EF 30-35% 01/08/2014    PAST SURGICAL HISTORY: Past Surgical History  Procedure Laterality Date  . Esophagogastroduodenoscopy N/A 11/11/2012    Procedure: ESOPHAGOGASTRODUODENOSCOPY (EGD);  Surgeon: Lear Ng, MD;  Location: [facility] ENDOSCOPY;  Service: Endoscopy;  Laterality: N/A;  . Subxyphoid pericardial window N/A 11/13/2012   Procedure: SUBXYPHOID PERICARDIAL WINDOW;  Surgeon: Grace Isaac, MD;  Location: [facility];  Service: Thoracic;  Laterality: N/A;  . Esophagogastroduodenoscopy N/A 11/25/2012    Procedure: ESOPHAGOGASTRODUODENOSCOPY (EGD);  Surgeon: Winfield Cunas., MD;  Location: [facility] ENDOSCOPY;  Service: Endoscopy;  Laterality: N/A;  . Esophagogastroduodenoscopy N/A 01/13/2014    Procedure: ESOPHAGOGASTRODUODENOSCOPY (EGD);  Surgeon: Wonda Horner, MD;  Location: [facility] ENDOSCOPY;  Service: Endoscopy;  Laterality: N/A;    SOCIAL HISTORY:  reports that he has never smoked. He has never used smokeless tobacco. He reports that he does not drink alcohol or use illicit drugs.  FAMILY HISTORY: Unobtainable due to patient being a poor historian  CURRENT MEDICATIONS: Reviewed per MAR/see medication list  REVIEW OF SYSTEMS: Unobtainable due to patient being a poor historian  PHYSICAL EXAMINATION  VS:  See VS section  GENERAL: no acute distress, normal body habitus EYES: conjunctivae normal, sclerae normal, normal eye lids MOUTH/THROAT: lips without lesions,no lesions in the mouth,tongue is without lesions,uvula elevates in midline NECK: supple, trachea midline, no neck masses, no thyroid tenderness, no thyromegaly LYMPHATICS: no LAN in the neck, no supraclavicular LAN RESPIRATORY: breathing is even & unlabored, BS CTAB CARDIAC: RRR, no murmur,no extra heart sounds, no edema GI:  ABDOMEN: abdomen soft, normal BS, no masses, no tenderness  LIVER/SPLEEN: no hepatomegaly, no splenomegaly MUSCULOSKELETAL: HEAD: normal to inspection & palpation BACK: no kyphosis, scoliosis or spinal processes tenderness EXTREMITIES: Unable to assess, he has right-sided hemiparesis PSYCHIATRIC: the patient is alert & oriented to person, decreased affect & behavior appropriate  LABS/RADIOLOGY:  Labs reviewed: Basic Metabolic Panel:  Recent Labs  01/08/14 2120  01/10/14 0540  01/13/14 1823  01/18/14 0515 01/19/14 0459  01/20/14 0515  NA  --   < > 144  < > 143  < > 140 138 140  K  --   < > 3.9  < > 4.4  < > 3.6* 4.4 4.1  CL  --   < > 111  < > 109  < > 107 105 104  CO2  --   < > 24  < > 22  < > 24 25 26   GLUCOSE  --   < > 139*  < > 154*  < > 87 85 88  BUN  --   < > 12  < > 5*  < > 10 13 12   CREATININE  --   < > 1.39*  < > 1.34  < > 1.43* 1.37* 1.39*  CALCIUM  --   < > 8.9  < > 9.5  < > 8.3* 9.3 9.3  MG 1.8  --  2.0  --  2.5  --   --   --   --   PHOS  --   --   --   --  2.2*  --   --   --   --   < > = values in this interval not displayed. Liver Function Tests:  Recent Labs  10/20/13 1031 01/08/14 1530 01/09/14 0400  AST 16 15 16   ALT 11 8 7   ALKPHOS 80 76 62  BILITOT <0.2* 0.2* 0.3  PROT 7.9 7.0 5.9*  ALBUMIN 3.5 3.2* 2.8*    Recent Labs  10/20/13 1031  LIPASE 24   CBC:  Recent Labs  10/20/13 1031  01/08/14 1530  01/18/14 0515 01/19/14 0459 01/20/14 0515 01/20/14 0823  WBC 13.9*  < > 11.1*  < > 4.4 5.3 4.5  --   NEUTROABS 11.0*  --  9.0*  --   --   --   --   --   HGB 14.2  < > 13.9  < > 8.3* 9.1* 7.2* 9.8*  HCT 42.9  < > 42.9  < > 26.4* 29.4* 22.8* 31.1*  MCV 85.3  < > 79.2  < > 79.8 79.2 79.2  --   PLT 465*  < > 460*  < > 263 322 360  --   < > = values in this interval not displayed.  Cardiac Enzymes:  Recent Labs  01/16/14 1745 01/17/14 0130 01/17/14 1015  TROPONINI <0.30 <0.30 <0.30   CBG:  Recent Labs  01/19/14 2217 01/20/14 0738 01/20/14 1149  GLUCAP 108* 95 100*    PORTABLE CHEST - 1 VIEW   COMPARISON:  DG CHEST 1V PORT dated 10/20/2013   FINDINGS: Interval placement of a right central venous line with tip in the distal SVC. Cardiac silhouette is enlarged. There are low lung volumes. No pneumothorax.   IMPRESSION: Right central venous line in good position.    ASSESSMENT/PLAN:  Erosive esophagitis-continue Protonix twice a day. Cardiomyopathy-well compensated CVA-patient has right-sided hemiparesis and dysphagia. Continue supportive  care. BPH-continue Flomax Chronic kidney disease stage III- recheck renal functions Acute blood loss anemia-recheck. Continue iron. Diabetes mellitus with vascular complications-check hemoglobin A1c Hyperlipidemia-check fasting lipid panel Check CBC and CMP  I have reviewed patient's medical records received at admission/from hospitalization.  CPT CODE: 29528  Lakasha Mcfall Y Glema Takaki, [place] [phone]

## 2014-02-02 ENCOUNTER — Non-Acute Institutional Stay (SKILLED_NURSING_FACILITY): Payer: Medicare Other | Admitting: Internal Medicine

## 2014-02-02 DIAGNOSIS — N039 Chronic nephritic syndrome with unspecified morphologic changes: Principal | ICD-10-CM

## 2014-02-02 DIAGNOSIS — N183 Chronic kidney disease, stage 3 unspecified: Secondary | ICD-10-CM

## 2014-02-02 DIAGNOSIS — D473 Essential (hemorrhagic) thrombocythemia: Secondary | ICD-10-CM

## 2014-02-02 DIAGNOSIS — D631 Anemia in chronic kidney disease: Secondary | ICD-10-CM

## 2014-02-04 NOTE — Progress Notes (Signed)
Patient ID: [name], male   DOB: [birth year], 68 y.o.   MRN: [id number]            PROGRESS NOTE  DATE: 02/02/2014       FACILITY:  [facility]  LEVEL OF CARE: SNF (31)  Acute Visit  CHIEF COMPLAINT:  Manage anemia of chronic kidney disease and chronic kidney disease.     HISTORY OF PRESENT ILLNESS: I was requested by the staff to assess the patient regarding above problem(s):  ANEMIA: The anemia is unstable. The patient denies fatigue, melena or hematochezia. No complications from the medications currently being used.  On 01/28/2014:  Hemoglobin 9.5, MCV 79.  In 11/2013:  Hemoglobin 11.3, MCV 81.  Patient overall is a poor historian.      CHRONIC KIDNEY DISEASE: The patient's chronic kidney disease is unstable.  Patient denies increasing lower extremity swelling or confusion. Last BUN and creatinine are:   On 01/28/2014:  BUN 18, creatinine 1.53.  In 10/2013:  Creatinine 1.29.    PAST MEDICAL HISTORY : Reviewed.  No changes/see problem list  CURRENT MEDICATIONS: Reviewed per MAR/see medication list  REVIEW OF SYSTEMS:  Difficult to obtain.  Patient is a poor historian.     PHYSICAL EXAMINATION  VS: see VS section        GENERAL: no acute distress, normal body habitus EYES: conjunctivae normal, sclerae normal, normal eye lids NECK: supple, trachea midline, no neck masses, no thyroid tenderness, no thyromegaly LYMPHATICS: no LAN in the neck, no supraclavicular LAN RESPIRATORY: breathing is even & unlabored, BS CTAB CARDIAC: RRR, no murmur,no extra heart sounds, no edema GI: abdomen soft, normal BS, no masses, no tenderness, no hepatomegaly, no splenomegaly PSYCHIATRIC: the patient is alert & oriented to person, affect & behavior appropriate  LABS/RADIOLOGY: On 01/28/2014:  Platelets 479.    In 11/2013:  Platelets 290.       ASSESSMENT/PLAN:  Anemia of chronic kidney disease.  Unstable problem.  Hemoglobin declined.  Recheck in one week.     Chronic kidney disease.  Unstable problem.  Renal functions worse.  Recheck in one week.    Thrombocytosis.  New problem.  We will monitor.     CPT CODE: 27062       Mayleigh Tetrault Y Roseland Braun, [place] [phone]

## 2014-02-14 ENCOUNTER — Non-Acute Institutional Stay (SKILLED_NURSING_FACILITY): Payer: Medicare Other | Admitting: Internal Medicine

## 2014-02-14 DIAGNOSIS — N183 Chronic kidney disease, stage 3 unspecified: Secondary | ICD-10-CM

## 2014-02-14 DIAGNOSIS — N039 Chronic nephritic syndrome with unspecified morphologic changes: Principal | ICD-10-CM

## 2014-02-14 DIAGNOSIS — D631 Anemia in chronic kidney disease: Secondary | ICD-10-CM

## 2014-02-17 NOTE — Progress Notes (Signed)
Patient ID: [name], male   DOB: 08/24/1946, 68 y.o.   MRN: [id number]            PROGRESS NOTE  DATE: 02/14/2014       FACILITY:  [facility]  LEVEL OF CARE: SNF (31)  Acute Visit  CHIEF COMPLAINT:  Manage anemia of chronic kidney disease and chronic kidney disease stage III.    HISTORY OF PRESENT ILLNESS: I was requested by the staff to assess the patient regarding above problem(s):  ANEMIA: The anemia is unstable. The patient denies fatigue, melena or hematochezia. No complications from the medications currently being used.  On 02/10/2014:  Hemoglobin 9.8, MCV 80.   In 11/2013:  Hemoglobin 11.3.  The anemia is secondary to chronic kidney disease.    CHRONIC KIDNEY DISEASE: The patient's chronic kidney disease is unstable.  Patient denies increasing lower extremity swelling or confusion. Last BUN and creatinine are:   On 02/10/2014:  BUN 19, creatinine 1.73.  In 10/2013:  Creatinine 1.29.  Patient is not on renal toxic medications.    He has a history of chronic kidney disease stage III.    PAST MEDICAL HISTORY : Reviewed.  No changes/see problem list  CURRENT MEDICATIONS: Reviewed per MAR/see medication list  REVIEW OF SYSTEMS:  GENERAL: no change in appetite, no fatigue, no weight changes, no fever, chills or weakness RESPIRATORY: no cough, SOB, DOE,, wheezing, hemoptysis CARDIAC: no chest pain, edema or palpitations GI: no abdominal pain, diarrhea, constipation, heart burn, nausea or vomiting  PHYSICAL EXAMINATION  VS:  T 98.2       P 70      RR 18      BP 148/86     POX 98%       WT (Lb) 192       GENERAL: no acute distress, normal body habitus EYES: conjunctivae normal, sclerae normal, normal eye lids NECK: supple, trachea midline, no neck masses, no thyroid tenderness, no thyromegaly LYMPHATICS: no LAN in the neck, no supraclavicular LAN RESPIRATORY: breathing is even & unlabored, BS CTAB CARDIAC: RRR, no murmur,no extra heart sounds, no  edema GI: abdomen soft, normal BS, no masses, no tenderness, no hepatomegaly, no splenomegaly PSYCHIATRIC: the patient is alert & oriented to person, affect & behavior appropriate  ASSESSMENT/PLAN:  Anemia of chronic kidney disease.  Unstable problem.  Hemoglobin has declined.  Recheck.    Chronic kidney disease stage III.  Unstable problem.  Renal functions are worse.  Recheck.    CPT CODE: 28413       [name], [place] [phone]

## 2014-02-21 ENCOUNTER — Non-Acute Institutional Stay (SKILLED_NURSING_FACILITY): Payer: Medicare Other | Admitting: Internal Medicine

## 2014-02-21 DIAGNOSIS — N183 Chronic kidney disease, stage 3 unspecified: Secondary | ICD-10-CM

## 2014-02-21 DIAGNOSIS — I150 Renovascular hypertension: Secondary | ICD-10-CM

## 2014-02-21 DIAGNOSIS — E7800 Pure hypercholesterolemia, unspecified: Secondary | ICD-10-CM

## 2014-02-21 DIAGNOSIS — F0390 Unspecified dementia without behavioral disturbance: Secondary | ICD-10-CM

## 2014-02-21 NOTE — Progress Notes (Signed)
Patient ID: [name], male   DOB: [birth year], 68 y.o.   MRN: [id number]          PROGRESS NOTE  DATE:  02-21-14  FACILITY: [facility]   LEVEL OF CARE: SNF  Routine Visit  CHIEF COMPLAINT:  Manage hyperlipidemia, dementia and renovascular hypertension.    HISTORY OF PRESENT ILLNESS:  REASSESSMENT OF ONGOING PROBLEM(S):  HYPERLIPIDEMIA: No complications from the medications presently being used. Last fasting lipid panel showed :   In 07/2012, fasting lipid panel normal, in 5/14 fasting lipid panel normal, in 11-14 HDL 38 otherwise fasting lipid panel normal, in 5-15 fasting lipid panel normal  HTN: Pt 's HTN remains stable.  Denies CP, sob, DOE, pedal edema, headaches, dizziness or visual disturbances.  No complications from the medications currently being used.  Last BP : 150/90,  160/90, 145/79, 100/56, 150/84,158/88, 150/84, 113/85, 130/70, 130/72, 132/66, 130/78, 138/80, 148/86.  DEMENTIA: The dementia remaines stable and continues to function adequately in the current living environment with supervision.  The patient has had little changes in behavior. No complications noted from the medications presently being used.  PAST MEDICAL HISTORY : Reviewed.  No changes.  CURRENT MEDICATIONS: Reviewed per [facility]  REVIEW OF SYSTEMS:  GENERAL: no change in appetite, no fatigue, no weight changes, no fever, chills or weakness RESPIRATORY: no cough, SOB, DOE, wheezing, hemoptysis CARDIAC: no chest pain, edema or palpitations GI: no abdominal pain, diarrhea, constipation, heart burn, nausea or vomiting  PHYSICAL EXAMINATION  VS: See vital signs section  GENERAL: no acute distress, normal body habitus EYES: Normal sclerae, normal conjunctivae, discharge NECK: supple, trachea midline, no neck masses, no thyroid tenderness, no thyromegaly LYMPHATICS: No cervical lymphadenopathy, no supraclavicular lymphadenopathy RESPIRATORY: breathing is even & unlabored, BS CTAB CARDIAC: RRR, no  murmur,no extra heart sounds, no edema GI: abdomen soft, normal BS, no masses, no tenderness, no hepatomegaly, no splenomegaly PSYCHIATRIC: the patient is alert & oriented to person, affect & behavior appropriate  LABS/RADIOLOGY: 5-15 hemoglobin 9.8, MCV 80 otherwise CBC normal, creatinine 1.73 otherwise BMP normal, total protein 5.9, albumin 3.5 otherwise liver profile normal, hemoglobin A1c 5.9 1/15 bmp nl, Hb 10.7, mcv 87 ow cbc nl 11-14 hemoglobin 12.5, MCV 84 otherwise CBC normal, glucose 105, creatinine 1.42, total protein 5.8, albumin 3.5 otherwise CMP normal  8-14 liver profile normal  7/14 Hb 9, mcv 66 ow cbc nl 6/14 hemoglobin 8, MCV 66, platelets 423, WBC 6 point 8, TIBC 2:30, serum iron 14% saturation 6, ferritin 41, hemoglobin A1c 6.8   5/14 WBC 11, hemoglobin 8.8, MCV 68, platelets 385, glucose 122, BUN 28, creatinine 1.69, CO2 18 11/2012:  Hemoglobin 11.4, MCV 83, platelets 388, WBC 6.7.    Glucose 127, creatinine 1.77, otherwise BMP normal.    07/2012:  Liver profile normal.  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 Will review a log.  Hyperlipidemia.  Well controlled.   Dementia.  Continue current medications.   Chronic kidney disease.   Unstable problem. Renal functions increased. Recheck pending  BPH.  Continue Flomax.   GERD.  Continue Prilosec.    Anemia of chronic kidney disease-unstable. Hemoglobin declined. Recheck pending  CPT CODE: 95621  [name] Y. Durwin Reges, [place] [phone]

## 2014-02-23 ENCOUNTER — Non-Acute Institutional Stay (SKILLED_NURSING_FACILITY): Payer: Medicare Other | Admitting: Internal Medicine

## 2014-02-23 DIAGNOSIS — N183 Chronic kidney disease, stage 3 unspecified: Secondary | ICD-10-CM

## 2014-02-23 DIAGNOSIS — N039 Chronic nephritic syndrome with unspecified morphologic changes: Principal | ICD-10-CM

## 2014-02-23 DIAGNOSIS — D631 Anemia in chronic kidney disease: Secondary | ICD-10-CM

## 2014-02-28 NOTE — Progress Notes (Signed)
Patient ID: [name], male   DOB: 1946/02/06, 68 y.o.   MRN: [id number]            PROGRESS NOTE  DATE: 02/23/2014         FACILITY:  [facility]  LEVEL OF CARE: SNF (31)  Acute Visit  CHIEF COMPLAINT:  Manage anemia of chronic kidney disease and chronic kidney disease stage III.    HISTORY OF PRESENT ILLNESS: I was requested by the staff to assess the patient regarding above problem(s):  ANEMIA: The anemia has been stable. The patient denies fatigue, melena or hematochezia. No complications from the medications currently being used.  On 02/18/2014:  Hemoglobin 11.2.  In 01/2014:  Hemoglobin 9.8.    CHRONIC KIDNEY DISEASE: The patient's chronic kidney disease remains stable.  Patient denies increasing lower extremity swelling or confusion. Last BUN and creatinine are:  On 02/18/2014:  BUN 17, creatinine 1.64.  In 01/2014:  Creatinine 1.74.    PAST MEDICAL HISTORY : Reviewed.  No changes/see problem list  CURRENT MEDICATIONS: Reviewed per MAR/see medication list  REVIEW OF SYSTEMS:  GENERAL: no change in appetite, no fatigue, no weight changes, no fever, chills or weakness RESPIRATORY: no cough, SOB, DOE,, wheezing, hemoptysis CARDIAC: no chest pain, edema or palpitations GI: no abdominal pain, diarrhea, constipation, heart burn, nausea or vomiting  PHYSICAL EXAMINATION  VS:  T 98.2       P 70      RR 18      BP 148/86     POX 98% room air       WT (Lb) 192       GENERAL: no acute distress, normal body habitus NECK: supple, trachea midline, no neck masses, no thyroid tenderness, no thyromegaly RESPIRATORY: breathing is even & unlabored, BS CTAB CARDIAC: RRR, no murmur,no extra heart sounds, no edema GI: abdomen soft, normal BS, no masses, no tenderness, no hepatomegaly, no splenomegaly PSYCHIATRIC: the patient is alert & oriented to person, affect & behavior appropriate  ASSESSMENT/PLAN:  Anemia of chronic kidney disease.  Hemoglobin improved.       Chronic kidney disease stage III.  Renal functions improved.    CPT CODE: 11031        Jenesa Foresta Y [name], [place] [phone]

## 2014-03-24 ENCOUNTER — Non-Acute Institutional Stay (SKILLED_NURSING_FACILITY): Payer: Medicare Other | Admitting: Internal Medicine

## 2014-03-24 DIAGNOSIS — E7800 Pure hypercholesterolemia, unspecified: Secondary | ICD-10-CM

## 2014-03-24 DIAGNOSIS — I150 Renovascular hypertension: Secondary | ICD-10-CM

## 2014-03-24 DIAGNOSIS — F0390 Unspecified dementia without behavioral disturbance: Secondary | ICD-10-CM

## 2014-03-24 DIAGNOSIS — N183 Chronic kidney disease, stage 3 unspecified: Secondary | ICD-10-CM

## 2014-03-24 NOTE — Progress Notes (Signed)
Patient ID: BENEDICT KUE, male   DOB: 1946-08-24, 68 y.o.   MRN: [id number]         PROGRESS NOTE  DATE:  03-24-14  FACILITY: [facility]   LEVEL OF CARE: SNF  Routine Visit  CHIEF COMPLAINT:  Manage hyperlipidemia, dementia and renovascular hypertension.    HISTORY OF PRESENT ILLNESS:  REASSESSMENT OF ONGOING PROBLEM(S):  HYPERLIPIDEMIA: No complications from the medications presently being used. Last fasting lipid panel showed :   In 07/2012, fasting lipid panel normal, in 5/14 fasting lipid panel normal, in 11-14 HDL 38 otherwise fasting lipid panel normal, in 5-15 fasting lipid panel normal  HTN: Pt 's HTN remains stable.  Denies CP, sob, DOE, pedal edema, headaches, dizziness or visual disturbances.  No complications from the medications currently being used.  Last BP : 150/90,  160/90, 145/79, 100/56, 150/84,158/88, 150/84, 113/85, 130/70, 130/72, 132/66, 130/78, 138/80, 148/86, 144/84.  DEMENTIA: The dementia remaines stable and continues to function adequately in the current living environment with supervision.  The patient has had little changes in behavior. No complications noted from the medications presently being used.  PAST MEDICAL HISTORY : Reviewed.  No changes.  CURRENT MEDICATIONS: Reviewed per [facility]  REVIEW OF SYSTEMS:  GENERAL: no change in appetite, no fatigue, no weight changes, no fever, chills or weakness RESPIRATORY: no cough, SOB, DOE, wheezing, hemoptysis CARDIAC: no chest pain, edema or palpitations GI: no abdominal pain, diarrhea, constipation, heart burn, nausea or vomiting  PHYSICAL EXAMINATION  VS: See vital signs section  GENERAL: no acute distress, normal body habitus NECK: supple, trachea midline, no neck masses, no thyroid tenderness, no thyromegaly RESPIRATORY: breathing is even & unlabored, BS CTAB CARDIAC: RRR, no murmur,no extra heart sounds, no edema GI: abdomen soft, normal BS, no masses, no tenderness, no hepatomegaly, no  splenomegaly PSYCHIATRIC: the patient is alert & oriented to person, affect & behavior appropriate  LABS/RADIOLOGY: 6-15 BUN 17, creatinine 1.64, hemoglobin 11.2 5-15 hemoglobin 9.8, MCV 80 otherwise CBC normal, creatinine 1.73 otherwise BMP normal, total protein 5.9, albumin 3.5 otherwise liver profile normal, hemoglobin A1c 5.9 1/15 bmp nl, Hb 10.7, mcv 87 ow cbc nl 11-14 hemoglobin 12.5, MCV 84 otherwise CBC normal, glucose 105, creatinine 1.42, total protein 5.8, albumin 3.5 otherwise CMP normal  8-14 liver profile normal  7/14 Hb 9, mcv 66 ow cbc nl 6/14 hemoglobin 8, MCV 66, platelets 423, WBC 6 point 8, TIBC 2:30, serum iron 14% saturation 6, ferritin 41, hemoglobin A1c 6.8   5/14 WBC 11, hemoglobin 8.8, MCV 68, platelets 385, glucose 122, BUN 28, creatinine 1.69, CO2 18 11/2012:  Hemoglobin 11.4, MCV 83, platelets 388, WBC 6.7.    Glucose 127, creatinine 1.77, otherwise BMP normal.    07/2012:  Liver profile normal.  ASSESSMENT/PLAN:  Renovascular hypertension-blood pressure borderline. Will monitor.  Hyperlipidemia.  Well controlled.   Dementia.  Continue current medications.   Chronic kidney disease.  Stable.  BPH.  Continue Flomax.   GERD.  Continue Prilosec.    Anemia of chronic kidney disease-stable.  CPT CODE: 27782  Gayani Y. Durwin Reges, [place] [phone]

## 2014-04-18 ENCOUNTER — Non-Acute Institutional Stay (SKILLED_NURSING_FACILITY): Payer: Medicare Other | Admitting: Internal Medicine

## 2014-04-18 DIAGNOSIS — N183 Chronic kidney disease, stage 3 unspecified: Secondary | ICD-10-CM

## 2014-04-18 DIAGNOSIS — I150 Renovascular hypertension: Secondary | ICD-10-CM

## 2014-04-18 DIAGNOSIS — E7800 Pure hypercholesterolemia, unspecified: Secondary | ICD-10-CM

## 2014-04-18 DIAGNOSIS — F0390 Unspecified dementia without behavioral disturbance: Secondary | ICD-10-CM

## 2014-04-19 NOTE — Progress Notes (Signed)
Patient ID: [name], male   DOB: [birth year], 68 y.o.   MRN: [id number]         PROGRESS NOTE  DATE:  04-18-14  FACILITY: [facility]   LEVEL OF CARE: SNF  Routine Visit  CHIEF COMPLAINT:  Manage hyperlipidemia, dementia and renovascular hypertension.    HISTORY OF PRESENT ILLNESS:  REASSESSMENT OF ONGOING PROBLEM(S):  HYPERLIPIDEMIA: No complications from the medications presently being used. Last fasting lipid panel showed :   In 07/2012, fasting lipid panel normal, in 5/14 fasting lipid panel normal, in 11-14 HDL 38 otherwise fasting lipid panel normal, in 5-15 fasting lipid panel normal  HTN: Pt 's HTN remains stable.  Denies CP, sob, DOE, pedal edema, headaches, dizziness or visual disturbances.  No complications from the medications currently being used.  Last BP : 150/90,  160/90, 145/79, 100/56, 150/84,158/88, 150/84, 113/85, 130/70, 130/72, 132/66, 130/78, 138/80, 148/86, 144/84, 126/82.  DEMENTIA: The dementia remaines stable and continues to function adequately in the current living environment with supervision.  The patient has had little changes in behavior. No complications noted from the medications presently being used.  PAST MEDICAL HISTORY : Reviewed.  No changes.  CURRENT MEDICATIONS: Reviewed per [facility]  REVIEW OF SYSTEMS:  GENERAL: no change in appetite, no fatigue, no weight changes, no fever, chills or weakness RESPIRATORY: no cough, SOB, DOE, wheezing, hemoptysis CARDIAC: no chest pain, edema or palpitations GI: no abdominal pain, diarrhea, constipation, heart burn, nausea or vomiting  PHYSICAL EXAMINATION  VS: See vital signs section  GENERAL: no acute distress, normal body habitus EYES: Normal sclerae, normal conjunctivae, no discharge NECK: supple, trachea midline, no neck masses, no thyroid tenderness, no thyromegaly LYMPHATICS: No cervical lymphadenopathy, no supraclavicular lymphadenopathy RESPIRATORY: breathing is even & unlabored, BS  CTAB CARDIAC: RRR, no murmur,no extra heart sounds, no edema GI: abdomen soft, normal BS, no masses, no tenderness, no hepatomegaly, no splenomegaly PSYCHIATRIC: the patient is alert & oriented to person, affect & behavior appropriate  LABS/RADIOLOGY: 7-15 hemoglobin 12, MCV 78 otherwise CBC normal 6-15 BUN 17, creatinine 1.64, hemoglobin 11.2 5-15 hemoglobin 9.8, MCV 80 otherwise CBC normal, creatinine 1.73 otherwise BMP normal, total protein 5.9, albumin 3.5 otherwise liver profile normal, hemoglobin A1c 5.9 1/15 bmp nl, Hb 10.7, mcv 87 ow cbc nl 11-14 hemoglobin 12.5, MCV 84 otherwise CBC normal, glucose 105, creatinine 1.42, total protein 5.8, albumin 3.5 otherwise CMP normal  8-14 liver profile normal  7/14 Hb 9, mcv 66 ow cbc nl 6/14 hemoglobin 8, MCV 66, platelets 423, WBC 6 point 8, TIBC 2:30, serum iron 14% saturation 6, ferritin 41, hemoglobin A1c 6.8   5/14 WBC 11, hemoglobin 8.8, MCV 68, platelets 385, glucose 122, BUN 28, creatinine 1.69, CO2 18 11/2012:  Hemoglobin 11.4, MCV 83, platelets 388, WBC 6.7.    Glucose 127, creatinine 1.77, otherwise BMP normal.    07/2012:  Liver profile normal.  ASSESSMENT/PLAN:  Renovascular hypertension-well controlled.  Hyperlipidemia.  Well controlled.   Dementia.  Continue current medications.   Chronic kidney disease.  Stable.  BPH.  Continue Flomax.   GERD.  Continue Prilosec.    Anemia of chronic kidney disease-hemoglobin improved.  Depression-Effexor was increased  CPT CODE: 21194  [name], [place] [phone]

## 2014-06-03 ENCOUNTER — Non-Acute Institutional Stay (SKILLED_NURSING_FACILITY): Payer: Medicare Other | Admitting: Internal Medicine

## 2014-06-03 DIAGNOSIS — N390 Urinary tract infection, site not specified: Secondary | ICD-10-CM

## 2014-06-06 ENCOUNTER — Non-Acute Institutional Stay (SKILLED_NURSING_FACILITY): Payer: Medicare Other | Admitting: Internal Medicine

## 2014-06-06 DIAGNOSIS — F0390 Unspecified dementia without behavioral disturbance: Secondary | ICD-10-CM

## 2014-06-06 DIAGNOSIS — N183 Chronic kidney disease, stage 3 unspecified: Secondary | ICD-10-CM

## 2014-06-06 DIAGNOSIS — E7800 Pure hypercholesterolemia, unspecified: Secondary | ICD-10-CM

## 2014-06-06 DIAGNOSIS — I150 Renovascular hypertension: Secondary | ICD-10-CM

## 2014-06-07 NOTE — Progress Notes (Signed)
Patient ID: [name], male   DOB: [birth year], 68 y.o.   MRN: [id number]         PROGRESS NOTE  DATE:  06-06-14  FACILITY: [facility]   LEVEL OF CARE: SNF  Routine Visit  CHIEF COMPLAINT:  Manage hyperlipidemia, dementia and renovascular hypertension.    HISTORY OF PRESENT ILLNESS:  REASSESSMENT OF ONGOING PROBLEM(S):  HYPERLIPIDEMIA: No complications from the medications presently being used. Last fasting lipid panel showed :   In 07/2012, fasting lipid panel normal, in 5/14 fasting lipid panel normal, in 11-14 HDL 38 otherwise fasting lipid panel normal, in 5-15 fasting lipid panel normal  HTN: Pt 's HTN remains stable.  Denies CP, sob, DOE, pedal edema, headaches, dizziness or visual disturbances.  No complications from the medications currently being used.  Last BP : 150/90,  160/90, 145/79, 100/56, 150/84,158/88, 150/84, 113/85, 130/70, 130/72, 132/66, 130/78, 138/80, 148/86, 144/84, 126/82, 128/72.  DEMENTIA: The dementia remaines stable and continues to function adequately in the current living environment with supervision.  The patient has had little changes in behavior. No complications noted from the medications presently being used.  PAST MEDICAL HISTORY : Reviewed.  No changes.  CURRENT MEDICATIONS: Reviewed per [facility]  REVIEW OF SYSTEMS:  GENERAL: no change in appetite, no fatigue, no weight changes, no fever, chills or weakness RESPIRATORY: no cough, SOB, DOE, wheezing, hemoptysis CARDIAC: no chest pain, edema or palpitations GI: no abdominal pain, diarrhea, constipation, heart burn, nausea or vomiting  PHYSICAL EXAMINATION  VS: See vital signs section  GENERAL: no acute distress, normal body habitus NECK: supple, trachea midline, no neck masses, no thyroid tenderness, no thyromegaly RESPIRATORY: breathing is even & unlabored, BS CTAB CARDIAC: RRR, no murmur,no extra heart sounds, no edema GI: abdomen soft, normal BS, no masses, no tenderness, no  hepatomegaly, no splenomegaly PSYCHIATRIC: the patient is alert & oriented to person, affect & behavior appropriate  LABS/RADIOLOGY: 7-15 hemoglobin 12, MCV 78 otherwise CBC normal 6-15 BUN 17, creatinine 1.64, hemoglobin 11.2 5-15 hemoglobin 9.8, MCV 80 otherwise CBC normal, creatinine 1.73 otherwise BMP normal, total protein 5.9, albumin 3.5 otherwise liver profile normal, hemoglobin A1c 5.9 1/15 bmp nl, Hb 10.7, mcv 87 ow cbc nl 11-14 hemoglobin 12.5, MCV 84 otherwise CBC normal, glucose 105, creatinine 1.42, total protein 5.8, albumin 3.5 otherwise CMP normal  8-14 liver profile normal  7/14 Hb 9, mcv 66 ow cbc nl 6/14 hemoglobin 8, MCV 66, platelets 423, WBC 6 point 8, TIBC 2:30, serum iron 14% saturation 6, ferritin 41, hemoglobin A1c 6.8   5/14 WBC 11, hemoglobin 8.8, MCV 68, platelets 385, glucose 122, BUN 28, creatinine 1.69, CO2 18 11/2012:  Hemoglobin 11.4, MCV 83, platelets 388, WBC 6.7.    Glucose 127, creatinine 1.77, otherwise BMP normal.    07/2012:  Liver profile normal.  ASSESSMENT/PLAN:  Renovascular hypertension-well controlled.  Hyperlipidemia.  Well controlled.   Dementia.  Continue current medications.   Chronic kidney disease.  Stable.  BPH.  Continue Flomax.   GERD.  Continue Prilosec.    Anemia of chronic kidney disease-hemoglobin improved.  Depression-on Effexor  Constipation-continue current laxatives  CVA-continue Plavix. Patient has right-sided hemiparesis.  CPT CODE: 48185  Gayani Y. Durwin Reges, [place] [phone]

## 2014-06-08 NOTE — Progress Notes (Signed)
Patient ID: [name], male   DOB: [birth year], 68 y.o.   MRN: [id number]           PROGRESS NOTE  DATE: 06/03/2014          FACILITY:  [facility]  LEVEL OF CARE: SNF (31)  Acute Visit  CHIEF COMPLAINT:  Manage UTI.    HISTORY OF PRESENT ILLNESS: I was requested by the staff to assess the patient regarding above problem(s):  Due to hematuria, the patient had urine studies done.  On 05/31/2014:  Urinalysis showed cloudy appearance, +3 WBC esterase, positive nitrite, WBCs greater than 30, RBCs 11-30.  Urine culture grows Proteus mirabilis significantly.  Patient denies any suprapubic pain, flank pain.     PAST MEDICAL HISTORY : Reviewed.  No changes/see problem list  CURRENT MEDICATIONS: Reviewed per MAR/see medication list  REVIEW OF SYSTEMS:  GENERAL: no change in appetite, no fatigue, no weight changes, no fever, chills or weakness RESPIRATORY: no cough, SOB, DOE,, wheezing, hemoptysis CARDIAC: no chest pain, edema or palpitations GI: no abdominal pain, diarrhea, constipation, heart burn, nausea or vomiting  PHYSICAL EXAMINATION  VS: see VS section  GENERAL: no acute distress, normal body habitus NECK: supple, trachea midline, no neck masses, no thyroid tenderness, no thyromegaly RESPIRATORY: breathing is even & unlabored, BS CTAB CARDIAC: RRR, no murmur,no extra heart sounds, no edema GI: abdomen soft, normal BS, no masses, no tenderness, no hepatomegaly, no splenomegaly PSYCHIATRIC: the patient is alert & oriented to person, affect & behavior appropriate  ASSESSMENT/PLAN:  UTI.  New problem.  Start Cipro 250 mg b.i.d. for 7 days and probiotics b.i.d. for 10 days.    CPT CODE: 98338            Jahmir Salo Y Mackinzie Vuncannon, [place] [phone]

## 2014-06-17 ENCOUNTER — Emergency Department (HOSPITAL_COMMUNITY): Payer: Medicare Other

## 2014-06-17 ENCOUNTER — Encounter (HOSPITAL_COMMUNITY): Payer: Self-pay | Admitting: Emergency Medicine

## 2014-06-17 ENCOUNTER — Emergency Department (HOSPITAL_COMMUNITY)
Admission: EM | Admit: 2014-06-17 | Discharge: 2014-06-17 | Disposition: A | Discharge status: Home / Self Care | Payer: Medicare Other | Attending: Emergency Medicine | Admitting: Emergency Medicine

## 2014-06-17 ENCOUNTER — Other Ambulatory Visit: Payer: Self-pay | Admitting: Internal Medicine

## 2014-06-17 ENCOUNTER — Ambulatory Visit (HOSPITAL_COMMUNITY): Payer: Medicare Other

## 2014-06-17 DIAGNOSIS — K219 Gastro-esophageal reflux disease without esophagitis: Secondary | ICD-10-CM | POA: Diagnosis not present

## 2014-06-17 DIAGNOSIS — N390 Urinary tract infection, site not specified: Secondary | ICD-10-CM | POA: Insufficient documentation

## 2014-06-17 DIAGNOSIS — N400 Enlarged prostate without lower urinary tract symptoms: Secondary | ICD-10-CM | POA: Diagnosis not present

## 2014-06-17 DIAGNOSIS — T83511A Infection and inflammatory reaction due to indwelling urethral catheter, initial encounter: Secondary | ICD-10-CM

## 2014-06-17 DIAGNOSIS — Z7982 Long term (current) use of aspirin: Secondary | ICD-10-CM | POA: Diagnosis not present

## 2014-06-17 DIAGNOSIS — Y846 Urinary catheterization as the cause of abnormal reaction of the patient, or of later complication, without mention of misadventure at the time of the procedure: Secondary | ICD-10-CM | POA: Diagnosis not present

## 2014-06-17 DIAGNOSIS — I429 Cardiomyopathy, unspecified: Secondary | ICD-10-CM | POA: Insufficient documentation

## 2014-06-17 DIAGNOSIS — Z9889 Other specified postprocedural states: Secondary | ICD-10-CM | POA: Insufficient documentation

## 2014-06-17 DIAGNOSIS — T8351XA Infection and inflammatory reaction due to indwelling urinary catheter, initial encounter: Secondary | ICD-10-CM | POA: Diagnosis not present

## 2014-06-17 DIAGNOSIS — I10 Essential (primary) hypertension: Secondary | ICD-10-CM | POA: Diagnosis not present

## 2014-06-17 DIAGNOSIS — Z8673 Personal history of transient ischemic attack (TIA), and cerebral infarction without residual deficits: Secondary | ICD-10-CM | POA: Diagnosis not present

## 2014-06-17 DIAGNOSIS — Z79899 Other long term (current) drug therapy: Secondary | ICD-10-CM | POA: Insufficient documentation

## 2014-06-17 DIAGNOSIS — E785 Hyperlipidemia, unspecified: Secondary | ICD-10-CM | POA: Insufficient documentation

## 2014-06-17 DIAGNOSIS — F0390 Unspecified dementia without behavioral disturbance: Secondary | ICD-10-CM | POA: Diagnosis not present

## 2014-06-17 DIAGNOSIS — R531 Weakness: Secondary | ICD-10-CM | POA: Diagnosis present

## 2014-06-17 DIAGNOSIS — I639 Cerebral infarction, unspecified: Secondary | ICD-10-CM

## 2014-06-17 LAB — URINE MICROSCOPIC-ADD ON

## 2014-06-17 LAB — BASIC METABOLIC PANEL
Anion gap: 12 (ref 5–15)
BUN: 27 mg/dL — AB (ref 6–23)
CALCIUM: 9.9 mg/dL (ref 8.4–10.5)
CHLORIDE: 101 meq/L (ref 96–112)
CO2: 23 meq/L (ref 19–32)
Creatinine, Ser: 1.65 mg/dL — ABNORMAL HIGH (ref 0.50–1.35)
GFR calc Af Amer: 48 mL/min — ABNORMAL LOW (ref >90)
GFR calc non Af Amer: 41 mL/min — ABNORMAL LOW (ref >90)
Glucose, Bld: 107 mg/dL — ABNORMAL HIGH (ref 70–99)
Potassium: 4.8 mEq/L (ref 3.7–5.3)
Sodium: 136 mEq/L — ABNORMAL LOW (ref 137–147)

## 2014-06-17 LAB — CBC
HEMATOCRIT: 39.1 % (ref 39.0–52.0)
Hemoglobin: 12.7 g/dL — ABNORMAL LOW (ref 13.0–17.0)
MCH: 25.3 pg — AB (ref 26.0–34.0)
MCHC: 32.5 g/dL (ref 30.0–36.0)
MCV: 77.9 fL — ABNORMAL LOW (ref 78.0–100.0)
Platelets: 276 10*3/uL (ref 150–400)
RBC: 5.02 MIL/uL (ref 4.22–5.81)
RDW: 16.9 % — ABNORMAL HIGH (ref 11.5–15.5)
WBC: 5.8 10*3/uL (ref 4.0–10.5)

## 2014-06-17 LAB — URINALYSIS, ROUTINE W REFLEX MICROSCOPIC
BILIRUBIN URINE: NEGATIVE
GLUCOSE, UA: NEGATIVE mg/dL
KETONES UR: NEGATIVE mg/dL
Nitrite: NEGATIVE
Protein, ur: NEGATIVE mg/dL
SPECIFIC GRAVITY, URINE: 1.008 (ref 1.005–1.030)
Urobilinogen, UA: 0.2 mg/dL (ref 0.0–1.0)
pH: 5 (ref 5.0–8.0)

## 2014-06-17 LAB — CBG MONITORING, ED: Glucose-Capillary: 91 mg/dL (ref 70–99)

## 2014-06-17 MED ORDER — CEFPODOXIME PROXETIL 100 MG PO TABS: 100.0000 mg | ORAL_TABLET | Freq: Two times a day (BID) | ORAL | Status: DC

## 2014-06-17 MED ORDER — DEXTROSE 5 % IV SOLN
1.0000 g | Freq: Once | INTRAVENOUS | Status: AC
  Administered 2014-06-17: 1 g via INTRAVENOUS
  Filled 2014-06-17: qty 10

## 2014-06-17 NOTE — ED Provider Notes (Signed)
CSN: [id number]     Arrival date & time 06/17/14  1455 History   First MD Initiated Contact with Patient 06/17/14 1502     Chief Complaint  Patient presents with  . Weakness     (Consider location/radiation/quality/duration/timing/severity/associated sxs/prior Treatment) Patient is a 68 y.o. male presenting with weakness. The history is provided by the patient.  Weakness This is a chronic problem. Episode onset: unknown. The problem occurs constantly. The problem has not changed since onset.Pertinent negatives include no chest pain and no shortness of breath. Nothing aggravates the symptoms. Nothing relieves the symptoms. He has tried nothing for the symptoms. The treatment provided no relief.    Past Medical History  Diagnosis Date  . Dementia   . Stroke   . Hydronephrosis   . Hypertension   . Hyperlipemia   . GERD (gastroesophageal reflux disease)   . BPH (benign prostatic hyperplasia)   . Cardiomyopathy: Per 2 D echo 10/22/13 EF 30-35% 01/08/2014   Past Surgical History  Procedure Laterality Date  . Esophagogastroduodenoscopy N/A 11/11/2012    Procedure: ESOPHAGOGASTRODUODENOSCOPY (EGD);  Surgeon: Lear Ng, MD;  Location: [facility] ENDOSCOPY;  Service: Endoscopy;  Laterality: N/A;  . Subxyphoid pericardial window N/A 11/13/2012    Procedure: SUBXYPHOID PERICARDIAL WINDOW;  Surgeon: Grace Isaac, MD;  Location: [place];  Service: Thoracic;  Laterality: N/A;  . Esophagogastroduodenoscopy N/A 11/25/2012    Procedure: ESOPHAGOGASTRODUODENOSCOPY (EGD);  Surgeon: Winfield Cunas., MD;  Location: [facility] ENDOSCOPY;  Service: Endoscopy;  Laterality: N/A;  . Esophagogastroduodenoscopy N/A 01/13/2014    Procedure: ESOPHAGOGASTRODUODENOSCOPY (EGD);  Surgeon: Wonda Horner, MD;  Location: [facility] ENDOSCOPY;  Service: Endoscopy;  Laterality: N/A;   No family history on file. History  Substance Use Topics  . Smoking status: Never Smoker   . Smokeless tobacco: Never Used  . Alcohol Use: No     Review of Systems  Unable to perform ROS: Dementia  Respiratory: Negative for cough and shortness of breath.   Cardiovascular: Negative for chest pain.  Neurological: Positive for weakness.      Allergies  Review of patient's allergies indicates no known allergies.  Home Medications   Prior to Admission medications   Medication Sig Start Date End Date Taking? Authorizing Provider  amiodarone (PACERONE) 200 MG tablet Take 1 tablet (200 mg total) by mouth 2 (two) times daily. 01/20/14   Bonnielee Haff, MD  aspirin 81 MG tablet Take 1 tablet (81 mg total) by mouth daily. PLEASE RESUME ON 01/28/14 01/20/14   Bonnielee Haff, MD  atorvastatin (LIPITOR) 20 MG tablet Take 1 tablet (20 mg total) by mouth daily at 6 PM. 12/04/12   Belkys A Regalado, MD  cloNIDine (CATAPRES) 0.2 MG tablet Take 0.5 tablets (0.1 mg total) by mouth 2 (two) times daily as needed. FOR SBP GREATER THAN 160. IF BP REMAINS ELEVATED CONSISTENTLY, CONSIDER SCHEDULED DOSING. 01/20/14   Bonnielee Haff, MD  clopidogrel (PLAVIX) 75 MG tablet Take 1 tablet (75 mg total) by mouth daily with breakfast. PLEASE RESUME ON 01/28/14 01/20/14   Bonnielee Haff, MD  docusate sodium (COLACE) 100 MG capsule Take 100 mg by mouth 2 (two) times daily.    Historical Provider, MD  feeding supplement, ENSURE COMPLETE, (ENSURE COMPLETE) LIQD Take 237 mLs by mouth 2 (two) times daily between meals. 01/20/14   Bonnielee Haff, MD  ferrous sulfate 325 (65 FE) MG tablet Take 325 mg by mouth 2 (two) times daily with a meal.    Historical Provider, MD  isosorbide-hydrALAZINE (  BIDIL) 20-37.5 MG per tablet Take 1 tablet by mouth 3 (three) times daily. 01/20/14   Bonnielee Haff, MD  lactulose (CHRONULAC) 10 GM/15ML solution Take 30 mLs by mouth 2 (two) times daily.    Historical Provider, MD  memantine (NAMENDA) 10 MG tablet Take 10 mg by mouth 2 (two) times daily.    Historical Provider, MD  metoCLOPramide (REGLAN) 10 MG tablet Take 1 tablet (10 mg total) by mouth 4  (four) times daily -  before meals and at bedtime. 10/25/13   Cherene Altes, MD  metoprolol succinate (TOPROL-XL) 200 MG 24 hr tablet Take 1 tablet (200 mg total) by mouth daily. Take with or immediately following a meal. 11/20/12   Nita Sells, MD  pantoprazole (PROTONIX) 40 MG tablet Take 1 tablet (40 mg total) by mouth 2 (two) times daily before a meal. 10/25/13   Cherene Altes, MD  polyethylene glycol [facility] Floria Raveling) packet Take 17 g by mouth daily. 12/04/12   Belkys A Regalado, MD  Polyvinyl Alcohol-Povidone (FRESHKOTE OP) Place 1 drop into both eyes 4 (four) times daily.    Historical Provider, MD  tamsulosin (FLOMAX) 0.4 MG CAPS Take 0.4 mg by mouth daily.    Historical Provider, MD  venlafaxine XR (EFFEXOR-XR) 75 MG 24 hr capsule Take 75 mg by mouth daily with breakfast.    Historical Provider, MD   BP 145/57  Pulse 59  Temp(Src) 98.4 F (36.9 C) (Oral)  Resp 18  SpO2 100% Physical Exam  Nursing note and vitals reviewed. Constitutional: He appears well-developed and well-nourished. No distress.  HENT:  Head: Normocephalic and atraumatic.  Mouth/Throat: Oropharynx is clear and moist. No oropharyngeal exudate.  Eyes: EOM are normal. Pupils are equal, round, and reactive to light.  Neck: Normal range of motion. Neck supple.  Cardiovascular: Normal rate and regular rhythm.  Exam reveals no friction rub.   No murmur heard. Pulmonary/Chest: Effort normal and breath sounds normal. No respiratory distress. He has no wheezes. He has no rales.  Abdominal: He exhibits no distension. There is no tenderness. There is no rebound.  Musculoskeletal: Normal range of motion. He exhibits no edema.  Neurological: He is alert. No cranial nerve deficit. He exhibits abnormal muscle tone (R arm flaccid, R leg 2/5 strength). GCS eye subscore is 4. GCS verbal subscore is 4. GCS motor subscore is 6.  Skin: No rash noted. He is not diaphoretic.    ED Course  Procedures (including critical  care time) Labs Review Labs Reviewed - No data to display  Imaging Review Ct Head Wo Contrast  06/17/2014   CLINICAL DATA:  Increased right-sided weakness today; history of previous CVA and right-sided weakness; dementia and hypertension; the patient is unable or body additional history  EXAM: CT HEAD WITHOUT CONTRAST  TECHNIQUE: Contiguous axial images were obtained from the base of the skull through the vertex without intravenous contrast.  COMPARISON:  Noncontrast CT scan of the brain of November 12, 2012  FINDINGS: There is moderate ventriculomegaly. There is a large area of encephalomalacia in the left anterior parietal lobe. A stable coarsely calcified 2 x 1.9 cm mass along the lateral border of the mid body of the left lateral ventricle extending to the medial aspect of the area of encephalomalacia is again demonstrated. 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 There is no shift of the midline. There is decreased density within the deep white matter of both cerebral hemispheres consistent with  chronic small vessel ischemia. There are mild atrophic changes of the cerebellum. There is no acute intracranial hemorrhage.  The observed paranasal sinuses and mastoid air cells are clear. There is no acute skull fracture.  IMPRESSION: 1. There are extensive chronic changes in the left anterior parietal lobe consistent with previous neuro surgical procedure with craniectomy. A stable coarsely calcified mass is present here as well asencephalomalacia. 2. There is no acute ischemic or hemorrhagic infarction. There are chronic small vessel ischemic changes in both cerebral hemispheres.   Electronically Signed   By: David  Martinique   On: 06/17/2014 16:17     EKG Interpretation None     Angiocath insertion Performed by: Osvaldo Shipper  Consent: Verbal consent obtained. Risks and benefits: risks, benefits and alternatives were discussed Time out:  Immediately prior to procedure a "time out" was called to verify the correct patient, procedure, equipment, support staff and site/side marked as required.  Preparation: Patient was prepped and draped in the usual sterile fashion.  Vein Location: L AC  Yes Ultrasound Guided  Gauge: 20  Normal blood return and flush without difficulty Patient tolerance: Patient tolerated the procedure well with no immediate complications.    MDM   Final diagnoses:  Urinary tract infection associated with catheterization of urinary tract, initial encounter    68M presents with R sided weakness, worsening from prior. Hx of CVA with residual R sided deficits. Recent UTI diagnosis, started on Cipro 250mg  BID about 7 days ago. No fevers. Patient is verbal at baseline but confused. Unable to talk to nursing home to procure history or last known normal. AFVSS here. R arm and leg weakness, arm flaccid, leg with some mild strength. Will scan head and look for other sources of his worsening R sided weakness. Urine infected. Patient given Rocephin and Rx for Vantin. Eating, vitals stable, well-appearing, stable for discharge.    Evelina Bucy, MD 06/18/14 [phone]

## 2014-06-17 NOTE — ED Notes (Addendum)
Unable to establish IV x 2 RN's.  Dr Mingo Amber states will attempt EJ when pt returns from ct.

## 2014-06-17 NOTE — Discharge Instructions (Signed)
Catheter-Associated Urinary Tract Infection FAQs °WHAT IS "CATHETER-ASSOCIATED" URINARY TRACT INFECTION? °A urinary tract infection (also called "UTI") is an infection in the urinary system, which includes the bladder (which stores the urine) and the kidneys (which filter the blood to make urine). Germs (for example, bacteria or yeasts) do not normally live in these areas; but if germs are introduced, an infection can occur. If you have a urinary catheter, germs can travel along the catheter and cause an infection in your bladder or your kidney; in that case it is called a catheter-associated urinary tract infection (or "CA-UTI").  °WHAT IS A URINARY CATHETER? °A urinary catheter is a thin tube placed in the bladder to drain urine. Urine drains through the tube into a bag that collects the urine. A urinary  °catheter may be used: °· If you are not able to urinate on your own. °· To measure the amount of urine that you make, for example, during intensive care. °· During and after some types of surgery. °· During some tests of the kidneys and bladder . °People with urinary catheters have a much higher chance of getting a urinary tract infection than people who don't have a catheter. °HOW DO I GET A CATHETER-ASSOCIATED URINARY TRACT INFECTION (CA-UTI)? °If germs enter the urinary tract, they may cause an infection. Many of the germs that cause a catheter-associated urinary tract infection are common germs found in your intestines that do not usually cause an infection there. Germs can enter the urinary tract when the catheter is being put in or while the catheter remains in the bladder.  °WHAT ARE THE SYMPTOMS OF A URINARY TRACT INFECTION?  °Some of the common symptoms of a urinary tract infection are: °· Burning or pain in the lower abdomen (that is, below the stomach). °· Fever. °· Bloody urine may be a sign of infection, but is also caused by other problems . °· Burning during urination or an increase in the  frequency of urination after the catheter is removed. °Sometimes people with catheter-associated urinary tract infections do not have these symptoms of infection. °CAN CATHETER-ASSOCIATED URINARY TRACT INFECTIONS BE TREATED? °Yes, most catheter-associated urinary tract infections can be treated with antibiotics and removal or change of the catheter. Your doctor will determine which antibiotic is best for you.  °WHAT ARE SOME OF THE THINGS THAT HOSPITALS ARE DOING TO PREVENT CATHETER-ASSOCIATED URINARY TRACT INFECTIONS? °To prevent urinary tract infections, doctors and nurses take the following actions.  °Catheter insertion °· Catheters are put in only when necessary and they are removed as soon as possible. °· Only properly trained persons insert catheters using sterile ("clean") technique. °· The skin in the area where the catheter will be inserted is cleaned before inserting the catheter. °· Other methods to drain the urine are sometimes used, such as: °¨ External catheters in men (these look like condoms and are placed over the penis rather than into the penis) °¨ Putting a temporary catheter in to drain the urine and removing it right away. This is called intermittent urethral catheterization. °Catheter care °· Healthcare providers clean their hands by washing them with soap and water or using an alcohol-based hand rub before and after touching your catheter. °¨ If you do not see your providers clean their hands, please ask them to do so. °· Avoid disconnecting the catheter and drain tube. This helps to prevent germs from getting into the catheter tube. °· The catheter is secured to the leg to prevent pulling on the   catheter. °· Avoid twisting or kinking the catheter. °· Keep the bag lower than the bladder to prevent urine from backflowing to the bladder. °· Empty the bag regularly. The drainage spout should not touch anything while emptying the bag. °WHAT CAN I DO TO HELP PREVENT CATHETER-ASSOCIATED URINARY  TRACT INFECTIONS IF I HAVE A CATHETER? °· Always clean your hands before and after doing catheter care. °· Always keep your urine bag below the level of your bladder. °· Do not tug or pull on the tubing. °· Do not twist or kink the catheter tubing. °· Ask your healthcare provider each day if you still need the catheter. °WHAT DO I NEED TO DO WHEN I GO HOME FROM THE HOSPITAL? °· If you will be going home with a catheter, your doctor or nurse should explain everything you need to know about taking care of the catheter. Make sure you understand how to care for it before you leave the hospital. °· If you develop any of the symptoms of a urinary tract infection, such as burning or pain in the lower abdomen, fever, or an increase in the frequency of urination, contact your doctor or nurse immediately. °· Before you go home, make sure you know who to contact if you have questions or problems after you get home. °If you have questions, please ask your doctor or nurse. °Developed and co-sponsored by The Society for Healthcare Epidemiology of America (SHEA); Infectious Diseases Society of America (IDSA); The American Hospital Association; Association for Professionals in Infection Control and Epidemiology (APIC); Center for Disease Control (CDC); and The Joint Commission °Document Released: 05/27/2012 Document Reviewed: 05/27/2012 °ExitCare® Patient Information ©2015 [employer]. This information is not intended to replace advice given to you by your health care provider. Make sure you discuss any questions you have with your health care provider. ° °

## 2014-06-17 NOTE — ED Notes (Signed)
Patient resident at nursing facility today and was being transferred for CT scan for evaluation of increased right sided weakness, EMS then called by facility for transport to ED for same, unknown symptom time and unknown baseline for patient, patient with past cva history

## 2014-07-04 ENCOUNTER — Non-Acute Institutional Stay (SKILLED_NURSING_FACILITY): Payer: Medicare Other | Admitting: Internal Medicine

## 2014-07-04 DIAGNOSIS — B351 Tinea unguium: Secondary | ICD-10-CM

## 2014-07-06 ENCOUNTER — Non-Acute Institutional Stay (SKILLED_NURSING_FACILITY): Payer: Medicare Other | Admitting: Internal Medicine

## 2014-07-06 DIAGNOSIS — E7800 Pure hypercholesterolemia, unspecified: Secondary | ICD-10-CM

## 2014-07-06 DIAGNOSIS — N183 Chronic kidney disease, stage 3 unspecified: Secondary | ICD-10-CM

## 2014-07-06 DIAGNOSIS — I150 Renovascular hypertension: Secondary | ICD-10-CM

## 2014-07-06 DIAGNOSIS — F0390 Unspecified dementia without behavioral disturbance: Secondary | ICD-10-CM

## 2014-07-06 NOTE — Progress Notes (Signed)
Patient ID: [name], male   DOB: [birth year], 68 y.o.   MRN: [id number]           PROGRESS NOTE  DATE: 07/04/2014         FACILITY:  [facility]  LEVEL OF CARE: SNF (31)  Acute Visit  CHIEF COMPLAINT:  Manage right thumb pain.    HISTORY OF PRESENT ILLNESS: I was requested by the staff to assess the patient regarding above problem(s):  Patient is complaining of pain in his right thumb.  Symptoms are acute, noted this morning.  He also has a broken nail and discoloration.    PAST MEDICAL HISTORY : Reviewed.  No changes/see problem list  CURRENT MEDICATIONS: Reviewed per MAR/see medication list  REVIEW OF SYSTEMS:  GENERAL: no change in appetite, no fatigue, no weight changes, no fever, chills or weakness RESPIRATORY: no cough, SOB, DOE,, wheezing, hemoptysis CARDIAC: no chest pain, edema or palpitations GI: no abdominal pain, diarrhea, constipation, heart burn, nausea or vomiting  PHYSICAL EXAMINATION  VS: see VS section  GENERAL: no acute distress, normal body habitus NECK: supple, trachea midline, no neck masses, no thyroid tenderness, no thyromegaly RESPIRATORY: breathing is even & unlabored, BS CTAB CARDIAC: RRR, no murmur,no extra heart sounds, no edema GI: abdomen soft, normal BS, no masses, no tenderness, no hepatomegaly, no splenomegaly PSYCHIATRIC: the patient is alert & oriented to person, affect & behavior appropriate MUSCULOSKELETAL: right thumb is tender to palpation, nail is broken and has gray to black discoloration       ASSESSMENT/PLAN:  Right thumb onychomycosis.  We will obtain a Dermatology consult for possible nail removal and treatment.    CPT CODE: 59741            Gayani Y Dasanayaka, [place] [phone]

## 2014-07-09 NOTE — Progress Notes (Signed)
Patient ID: [name], male   DOB: 09-12-1946, 68 y.o.   MRN: [id number]         PROGRESS NOTE  DATE:  07-06-14  FACILITY: [facility]   LEVEL OF CARE: SNF  Routine Visit  CHIEF COMPLAINT:  Manage hyperlipidemia, dementia and renovascular hypertension.    HISTORY OF PRESENT ILLNESS:  REASSESSMENT OF ONGOING PROBLEM(S):  HYPERLIPIDEMIA: No complications from the medications presently being used. Last fasting lipid panel showed :   In 07/2012, fasting lipid panel normal, in 5/14 fasting lipid panel normal, in 11-14 HDL 38 otherwise fasting lipid panel normal, in 5-15 fasting lipid panel normal  HTN: Pt 's HTN remains stable.  Denies CP, sob, DOE, pedal edema, headaches, dizziness or visual disturbances.  No complications from the medications currently being used.  Last BP : 150/90,  160/90, 145/79, 100/56, 150/84,158/88, 150/84, 113/85, 130/70, 130/72, 132/66, 130/78, 138/80, 148/86, 144/84, 126/82, 128/72, 146/86.  DEMENTIA: The dementia remaines stable and continues to function adequately in the current living environment with supervision.  The patient has had little changes in behavior. No complications noted from the medications presently being used.  PAST MEDICAL HISTORY : Reviewed.  No changes.  CURRENT MEDICATIONS: Reviewed per [facility]  REVIEW OF SYSTEMS:  GENERAL: no change in appetite, no fatigue, no weight changes, no fever, chills or weakness RESPIRATORY: no cough, SOB, DOE, wheezing, hemoptysis CARDIAC: no chest pain, edema or palpitations GI: no abdominal pain, diarrhea, constipation, heart burn, nausea or vomiting  PHYSICAL EXAMINATION  VS: See vital signs section  GENERAL: no acute distress, normal body habitus NECK: supple, trachea midline, no neck masses, no thyroid tenderness, no thyromegaly RESPIRATORY: breathing is even & unlabored, BS CTAB CARDIAC: RRR, no murmur,no extra heart sounds, no edema GI: abdomen soft, normal BS, no masses, no tenderness, no  hepatomegaly, no splenomegaly PSYCHIATRIC: the patient is alert & oriented to person, affect & behavior appropriate  LABS/RADIOLOGY: 7-15 hemoglobin 12, MCV 78 otherwise CBC normal 6-15 BUN 17, creatinine 1.64, hemoglobin 11.2 5-15 hemoglobin 9.8, MCV 80 otherwise CBC normal, creatinine 1.73 otherwise BMP normal, total protein 5.9, albumin 3.5 otherwise liver profile normal, hemoglobin A1c 5.9 1/15 bmp nl, Hb 10.7, mcv 87 ow cbc nl 11-14 hemoglobin 12.5, MCV 84 otherwise CBC normal, glucose 105, creatinine 1.42, total protein 5.8, albumin 3.5 otherwise CMP normal  8-14 liver profile normal  7/14 Hb 9, mcv 66 ow cbc nl 6/14 hemoglobin 8, MCV 66, platelets 423, WBC 6 point 8, TIBC 2:30, serum iron 14% saturation 6, ferritin 41, hemoglobin A1c 6.8   5/14 WBC 11, hemoglobin 8.8, MCV 68, platelets 385, glucose 122, BUN 28, creatinine 1.69, CO2 18 11/2012:  Hemoglobin 11.4, MCV 83, platelets 388, WBC 6.7.    Glucose 127, creatinine 1.77, otherwise BMP normal.    07/2012:  Liver profile normal.  ASSESSMENT/PLAN:  Renovascular hypertension-BP borderline  Hyperlipidemia.  Well controlled.   Dementia.  Continue current medications.   Chronic kidney disease.  Stable.  BPH.  Continue Flomax.   GERD.  Continue Prilosec.    Anemia of chronic kidney disease-hemoglobin improved.  Depression-on Effexor  Constipation-continue current laxatives  CVA-continue Plavix. Patient has right-sided hemiparesis.  CPT CODE: 56979  Gayani Y. Durwin Reges, [place] [phone]

## 2014-07-11 ENCOUNTER — Non-Acute Institutional Stay (SKILLED_NURSING_FACILITY): Payer: Medicare Other | Admitting: Internal Medicine

## 2014-07-11 DIAGNOSIS — R319 Hematuria, unspecified: Secondary | ICD-10-CM

## 2014-07-13 NOTE — Progress Notes (Signed)
Patient ID: [name], male   DOB: 20-Dec-1945, 68 y.o.   MRN: [id number]           PROGRESS NOTE  DATE: 07/11/2014        FACILITY:  [facility]  LEVEL OF CARE: SNF (31)  Acute Visit  CHIEF COMPLAINT:  Manage hematuria.    HISTORY OF PRESENT ILLNESS: I was requested by the staff to assess the patient regarding above problem(s):  Staff report that patient is noted to be having blood in his urinary catheter with a strong smell.  Patient denies suprapubic pain or flank pain.  He denies fever, chills or night sweats.    PAST MEDICAL HISTORY : Reviewed.  No changes/see problem list  CURRENT MEDICATIONS: Reviewed per MAR/see medication list  REVIEW OF SYSTEMS:  GENERAL: no change in appetite, no fatigue, no weight changes, no fever, chills or weakness RESPIRATORY: no cough, SOB, DOE,, wheezing, hemoptysis CARDIAC: no chest pain, edema or palpitations GI: no abdominal pain, diarrhea, constipation, heart burn, nausea or vomiting  PHYSICAL EXAMINATION  VS: see VS section  GENERAL: no acute distress, normal body habitus NECK: supple, trachea midline, no neck masses, no thyroid tenderness, no thyromegaly RESPIRATORY: breathing is even & unlabored, BS CTAB CARDIAC: RRR, no murmur,no extra heart sounds, no edema GI: abdomen soft, normal BS, no masses, no tenderness, no hepatomegaly, no splenomegaly PSYCHIATRIC: the patient is alert & oriented to person, affect & behavior appropriate  ASSESSMENT/PLAN:  Hematuria.  New problem.  Check UA, culture and sensitivities.    CPT CODE: 40981           [name] Y [name], [name] [phone]

## 2014-09-29 ENCOUNTER — Encounter (HOSPITAL_COMMUNITY): Payer: Self-pay | Admitting: Gastroenterology

## 2014-11-04 ENCOUNTER — Emergency Department (HOSPITAL_COMMUNITY): Payer: Medicare Other

## 2014-11-04 ENCOUNTER — Emergency Department (HOSPITAL_COMMUNITY)
Admission: EM | Admit: 2014-11-04 | Discharge: 2014-11-04 | Disposition: A | Discharge status: Home / Self Care | Payer: Medicare Other | Attending: Emergency Medicine | Admitting: Emergency Medicine

## 2014-11-04 ENCOUNTER — Encounter (HOSPITAL_COMMUNITY): Payer: Self-pay | Admitting: *Deleted

## 2014-11-04 DIAGNOSIS — Y9389 Activity, other specified: Secondary | ICD-10-CM | POA: Diagnosis not present

## 2014-11-04 DIAGNOSIS — I10 Essential (primary) hypertension: Secondary | ICD-10-CM | POA: Diagnosis not present

## 2014-11-04 DIAGNOSIS — Y998 Other external cause status: Secondary | ICD-10-CM | POA: Diagnosis not present

## 2014-11-04 DIAGNOSIS — N400 Enlarged prostate without lower urinary tract symptoms: Secondary | ICD-10-CM | POA: Diagnosis not present

## 2014-11-04 DIAGNOSIS — W1839XA Other fall on same level, initial encounter: Secondary | ICD-10-CM | POA: Insufficient documentation

## 2014-11-04 DIAGNOSIS — E785 Hyperlipidemia, unspecified: Secondary | ICD-10-CM | POA: Insufficient documentation

## 2014-11-04 DIAGNOSIS — K219 Gastro-esophageal reflux disease without esophagitis: Secondary | ICD-10-CM | POA: Diagnosis not present

## 2014-11-04 DIAGNOSIS — S0181XA Laceration without foreign body of other part of head, initial encounter: Secondary | ICD-10-CM | POA: Diagnosis not present

## 2014-11-04 DIAGNOSIS — Z7982 Long term (current) use of aspirin: Secondary | ICD-10-CM | POA: Insufficient documentation

## 2014-11-04 DIAGNOSIS — Y92128 Other place in nursing home as the place of occurrence of the external cause: Secondary | ICD-10-CM | POA: Insufficient documentation

## 2014-11-04 DIAGNOSIS — Z8673 Personal history of transient ischemic attack (TIA), and cerebral infarction without residual deficits: Secondary | ICD-10-CM | POA: Diagnosis not present

## 2014-11-04 DIAGNOSIS — W19XXXA Unspecified fall, initial encounter: Secondary | ICD-10-CM

## 2014-11-04 DIAGNOSIS — F0390 Unspecified dementia without behavioral disturbance: Secondary | ICD-10-CM | POA: Insufficient documentation

## 2014-11-04 DIAGNOSIS — Z7902 Long term (current) use of antithrombotics/antiplatelets: Secondary | ICD-10-CM | POA: Diagnosis not present

## 2014-11-04 DIAGNOSIS — Z79899 Other long term (current) drug therapy: Secondary | ICD-10-CM | POA: Diagnosis not present

## 2014-11-04 MED ORDER — LIDOCAINE HCL 2 % IJ SOLN
5.0000 mL | Freq: Once | INTRAMUSCULAR | Status: AC
  Administered 2014-11-04: 100 mg
  Filled 2014-11-04: qty 20

## 2014-11-04 NOTE — ED Notes (Signed)
Dressing changed to forehead

## 2014-11-04 NOTE — ED Provider Notes (Signed)
  Physical Exam  BP 135/61 mmHg  Pulse 62  Temp(Src) 97.8 F (36.6 C)  Resp 18  SpO2 100%  Physical Exam  Constitutional: He appears well-developed and well-nourished.  [name], bedridden male in no acute distress  HENT:  Head: Normocephalic and atraumatic.  Eyes: Right eye exhibits no discharge. Left eye exhibits no discharge. No scleral icterus.  Neck: Normal range of motion.  Pulmonary/Chest: Effort normal. No respiratory distress.  Musculoskeletal: Normal range of motion.  Neurological: He is alert.  Patient fully alert to his baseline mental status as reported by nursing home staff. Patient has contractures on right side, will make eye contact and answers yes or no questions appropriately.  Skin: Skin is warm and dry. He is not diaphoretic.  Psychiatric: He has a normal mood and affect.  Nursing note and vitals reviewed.   ED Course  LACERATION REPAIR Date/Time: 11/04/2014 5:47 PM Performed by: Carrie Mew Authorized by: Carrie Mew Time out: Immediately prior to procedure a "time out" was called to verify the correct patient, procedure, equipment, support staff and site/side marked as required. Body area: head/neck Location details: forehead Laceration length: 3 cm Foreign bodies: no foreign bodies Tendon involvement: none Nerve involvement: none Vascular damage: no Local anesthetic: lidocaine 2% without epinephrine Anesthetic total: 3 ml Irrigation solution: saline Amount of cleaning: standard Debridement: none Degree of undermining: none Skin closure: 5-0 Prolene Number of sutures: 7 Technique: simple Approximation: close Approximation difficulty: simple Patient tolerance: Patient tolerated the procedure well with no immediate complications    MDM I was asked to perform a laceration repair on this patient by Dr. Davonna Belling, M.D . Wound cleaning complete with pressure irrigation, bottom of wound visualized, no foreign bodies  appreciated. Laceration occurred < 8 hours prior to repair which was well tolerated.  Please see Dr. Neomia Dear note regarding patient's ED care.  Signed,  Dahlia Bailiff, PA-C 5:51 PM         Carrie Mew, PA-C 11/04/14 [place] Alvino Chapel, MD 11/08/14 [phone]

## 2014-11-04 NOTE — ED Notes (Signed)
Per EMS- pt is from [facility] and had an unwitnessed fall this morning. Pt was alert to baseline per facility. Pt noted to have a hematoma to rt forehead with dressing in place from EMS. Pt has previous rt sided deficits from CVA. Pt able to follow fommands

## 2014-11-04 NOTE — ED Notes (Signed)
Called [facility] 

## 2014-11-04 NOTE — ED Provider Notes (Signed)
CSN: [id number]     Arrival date & time 11/04/14  1209 History   First MD Initiated Contact with Patient 11/04/14 1311     Chief Complaint  Patient presents with  . Fall    Level 5 caveat due to dementia. (Consider location/radiation/quality/duration/timing/severity/associated sxs/prior Treatment) Patient is a 69 y.o. male presenting with fall. The history is provided by the patient.  Fall This is a new problem.   patient with fall. Reported mechanical fall at nursing home. Hematoma to forehead. Patient with dementia. Does complain of some pain in his head and neck. There  Past Medical History  Diagnosis Date  . Dementia   . Hypertension   . Hyperlipemia   . GERD (gastroesophageal reflux disease)   . BPH (benign prostatic hyperplasia)   . Cardiomyopathy: Per 2 D echo 10/22/13 EF 30-35% 01/08/2014  . Hydronephrosis   . Stroke    Past Surgical History  Procedure Laterality Date  . Esophagogastroduodenoscopy N/A 11/11/2012    Procedure: ESOPHAGOGASTRODUODENOSCOPY (EGD);  Surgeon: Lear Ng, MD;  Location: [facility] ENDOSCOPY;  Service: Endoscopy;  Laterality: N/A;  . Subxyphoid pericardial window N/A 11/13/2012    Procedure: SUBXYPHOID PERICARDIAL WINDOW;  Surgeon: Grace Isaac, MD;  Location: [facility];  Service: Thoracic;  Laterality: N/A;  . Esophagogastroduodenoscopy N/A 11/25/2012    Procedure: ESOPHAGOGASTRODUODENOSCOPY (EGD);  Surgeon: Winfield Cunas., MD;  Location: [facility] ENDOSCOPY;  Service: Endoscopy;  Laterality: N/A;  . Esophagogastroduodenoscopy N/A 01/13/2014    Procedure: ESOPHAGOGASTRODUODENOSCOPY (EGD);  Surgeon: Wonda Horner, MD;  Location: [facility] ENDOSCOPY;  Service: Endoscopy;  Laterality: N/A;   No family history on file. History  Substance Use Topics  . Smoking status: Never Smoker   . Smokeless tobacco: Never Used  . Alcohol Use: No    Review of Systems  Unable to perform ROS     Allergies  Review of patient's allergies indicates no known  allergies.  Home Medications   Prior to Admission medications   Medication Sig Start Date End Date Taking? Authorizing Provider  amiodarone (PACERONE) 200 MG tablet Take 1 tablet (200 mg total) by mouth 2 (two) times daily. 01/20/14  Yes Bonnielee Haff, MD  aspirin 81 MG chewable tablet Chew 81 mg by mouth daily.   Yes Historical Provider, MD  atorvastatin (LIPITOR) 20 MG tablet Take 1 tablet (20 mg total) by mouth daily at 6 PM. 12/04/12  Yes Belkys A Regalado, MD  cefpodoxime (VANTIN) 100 MG tablet Take 1 tablet (100 mg total) by mouth 2 (two) times daily. 06/17/14   Evelina Bucy, MD  cloNIDine (CATAPRES) 0.2 MG tablet Take 0.5 tablets (0.1 mg total) by mouth 2 (two) times daily as needed. FOR SBP GREATER THAN 160. IF BP REMAINS ELEVATED CONSISTENTLY, CONSIDER SCHEDULED DOSING. 01/20/14  Yes Bonnielee Haff, MD  clopidogrel (PLAVIX) 75 MG tablet Take 1 tablet (75 mg total) by mouth daily with breakfast. PLEASE RESUME ON 01/28/14 01/20/14  Yes Bonnielee Haff, MD  docusate sodium (COLACE) 100 MG capsule Take 100 mg by mouth 2 (two) times daily.   Yes Historical Provider, MD  feeding supplement, ENSURE COMPLETE, (ENSURE COMPLETE) LIQD Take 237 mLs by mouth 2 (two) times daily between meals. 01/20/14   Bonnielee Haff, MD  ferrous sulfate 325 (65 FE) MG tablet Take 325 mg by mouth 2 (two) times daily with a meal.   Yes Historical Provider, MD  isosorbide-hydrALAZINE (BIDIL) 20-37.5 MG per tablet Take 1 tablet by mouth 3 (three) times daily. 01/20/14  Yes Gokul  [place] Pink, MD  lactose free nutrition (BOOST) LIQD Take 237 mLs by mouth 2 (two) times daily between meals.   Yes Historical Provider, MD  lactulose (CHRONULAC) 10 GM/15ML solution Take 30 mLs by mouth 2 (two) times daily.   Yes Historical Provider, MD  memantine (NAMENDA) 10 MG tablet Take 10 mg by mouth 2 (two) times daily.   Yes Historical Provider, MD  metoCLOPramide (REGLAN) 10 MG tablet Take 1 tablet (10 mg total) by mouth 4 (four) times daily -  before  meals and at bedtime. 10/25/13  Yes Cherene Altes, MD  metoprolol succinate (TOPROL-XL) 200 MG 24 hr tablet Take 1 tablet (200 mg total) by mouth daily. Take with or immediately following a meal. 11/20/12  Yes Nita Sells, MD  pantoprazole (PROTONIX) 40 MG tablet Take 1 tablet (40 mg total) by mouth 2 (two) times daily before a meal. 10/25/13  Yes Cherene Altes, MD  polyethylene glycol (MIRALAX / GLYCOLAX) packet Take 17 g by mouth daily. 12/04/12  Yes Belkys A Regalado, MD  Polyvinyl Alcohol-Povidone 2.7-2 % SOLN Apply 1 drop to eye 4 (four) times daily.   Yes Historical Provider, MD  tamsulosin (FLOMAX) 0.4 MG CAPS Take 0.4 mg by mouth daily.   Yes Historical Provider, MD  venlafaxine XR (EFFEXOR-XR) 150 MG 24 hr capsule Take 150 mg by mouth daily with breakfast.   Yes Historical Provider, MD   BP 131/67 mmHg  Pulse 61  Temp(Src) 97.8 F (36.6 C)  Resp 15  SpO2 98% Physical Exam  Constitutional: He appears well-developed.  HENT:  Hematoma right forehead with 3 cm laceration  Eyes: Pupils are equal, round, and reactive to light.  Neck: Neck supple.  Cardiovascular: Normal rate and regular rhythm.   Pulmonary/Chest: Effort normal.  Abdominal: There is no tenderness.  Musculoskeletal: He exhibits no tenderness.  Neurological: He is alert.  Baseline dementia. Chronic right-sided weakness with some contractions.  Skin: Skin is warm.  Vitals reviewed.   ED Course  Procedures (including critical care time) Labs Review Labs Reviewed - No data to display  Imaging Review Ct Head Wo Contrast  11/04/2014   CLINICAL DATA:  Fall at nursing home with right forehead hematoma. History of left cerebral infarction and craniotomy.  EXAM: CT HEAD WITHOUT CONTRAST  CT CERVICAL SPINE WITHOUT CONTRAST  TECHNIQUE: Multidetector CT imaging of the head and cervical spine was performed following the standard protocol without intravenous contrast. Multiplanar CT image reconstructions of the  cervical spine were also generated.  COMPARISON:  06/17/2014 head CT  FINDINGS: CT HEAD FINDINGS  Acute right frontal scalp hematoma identified. No evidence of underlying skull fracture or soft tissue foreign body.  The rest of the study shows stable marked left-sided hemispheric encephalomalacia and evidence of prior left frontotemporal craniotomy. Stable area of calcification in the deep left brain adjacent to the lateral ventricle. Stable advanced small vessel ischemic changes in the periventricular white matter bilaterally. The brain demonstrates no evidence of hemorrhage, acute infarction, edema, mass effect, extra-axial fluid collection, hydrocephalus or mass lesion.  CT CERVICAL SPINE FINDINGS  The cervical spine shows normal alignment. There is no evidence of acute fracture or subluxation. No soft tissue swelling or hematoma is identified. There is minimal spondylosis at the C3-4, C4-5 and C6-7 levels. No bony or soft tissue lesions are seen. The visualized airway is normally patent.  IMPRESSION: 1. Right frontal scalp hematoma. No skull fracture or acute intracranial findings. Stable encephalomalacia of the left brain and evidence of  prior craniotomy. 2. No evidence of acute cervical injury. Mild spondylosis present at multiple levels of the cervical spine.   Electronically Signed   By: Aletta Edouard M.D.   On: 11/04/2014 14:17   Ct Cervical Spine Wo Contrast  11/04/2014   CLINICAL DATA:  Fall at nursing home with right forehead hematoma. History of left cerebral infarction and craniotomy.  EXAM: CT HEAD WITHOUT CONTRAST  CT CERVICAL SPINE WITHOUT CONTRAST  TECHNIQUE: Multidetector CT imaging of the head and cervical spine was performed following the standard protocol without intravenous contrast. Multiplanar CT image reconstructions of the cervical spine were also generated.  COMPARISON:  06/17/2014 head CT  FINDINGS: CT HEAD FINDINGS  Acute right frontal scalp hematoma identified. No evidence of  underlying skull fracture or soft tissue foreign body.  The rest of the study shows stable marked left-sided hemispheric encephalomalacia and evidence of prior left frontotemporal craniotomy. Stable area of calcification in the deep left brain adjacent to the lateral ventricle. Stable advanced small vessel ischemic changes in the periventricular white matter bilaterally. The brain demonstrates no evidence of hemorrhage, acute infarction, edema, mass effect, extra-axial fluid collection, hydrocephalus or mass lesion.  CT CERVICAL SPINE FINDINGS  The cervical spine shows normal alignment. There is no evidence of acute fracture or subluxation. No soft tissue swelling or hematoma is identified. There is minimal spondylosis at the C3-4, C4-5 and C6-7 levels. No bony or soft tissue lesions are seen. The visualized airway is normally patent.  IMPRESSION: 1. Right frontal scalp hematoma. No skull fracture or acute intracranial findings. Stable encephalomalacia of the left brain and evidence of prior craniotomy. 2. No evidence of acute cervical injury. Mild spondylosis present at multiple levels of the cervical spine.   Electronically Signed   By: Aletta Edouard M.D.   On: 11/04/2014 14:17     EKG Interpretation None      MDM   Final diagnoses:  Fall, initial encounter  Forehead laceration, initial encounter    Patient with mechanical fall. Negative imaging. CT scan negative. Will discharge home.    Jasper Riling. Alvino Chapel, MD 11/04/14 [phone]

## 2014-11-04 NOTE — Discharge Instructions (Signed)
Facial Laceration  A facial laceration is a cut on the face. These injuries can be painful and cause bleeding. Lacerations usually heal quickly, but they need special care to reduce scarring. DIAGNOSIS  Your health care provider will take a medical history, ask for details about how the injury occurred, and examine the wound to determine how deep the cut is. TREATMENT  Some facial lacerations may not require closure. Others may not be able to be closed because of an increased risk of infection. The risk of infection and the chance for successful closure will depend on various factors, including the amount of time since the injury occurred. The wound may be cleaned to help prevent infection. If closure is appropriate, pain medicines may be given if needed. Your health care provider will use stitches (sutures), wound glue (adhesive), or skin adhesive strips to repair the laceration. These tools bring the skin edges together to allow for faster healing and a better cosmetic outcome. If needed, you may also be given a tetanus shot. HOME CARE INSTRUCTIONS  Only take over-the-counter or prescription medicines as directed by your health care provider.  Follow your health care provider's instructions for wound care. These instructions will vary depending on the technique used for closing the wound. For Sutures:  Keep the wound clean and dry.   If you were given a bandage (dressing), you should change it at least once a day. Also change the dressing if it becomes wet or dirty, or as directed by your health care provider.   Wash the wound with soap and water 2 times a day. Rinse the wound off with water to remove all soap. Pat the wound dry with a clean towel.   After cleaning, apply a thin layer of the antibiotic ointment recommended by your health care provider. This will help prevent infection and keep the dressing from sticking.   You may shower as usual after the first 24 hours. Do not soak the  wound in water until the sutures are removed.   Get your sutures removed as directed by your health care provider. With facial lacerations, sutures should usually be taken out after 4-5 days to avoid stitch marks.   Wait a few days after your sutures are removed before applying any makeup. For Skin Adhesive Strips:  Keep the wound clean and dry.   Do not get the skin adhesive strips wet. You may bathe carefully, using caution to keep the wound dry.   If the wound gets wet, pat it dry with a clean towel.   Skin adhesive strips will fall off on their own. You may trim the strips as the wound heals. Do not remove skin adhesive strips that are still stuck to the wound. They will fall off in time.  For Wound Adhesive:  You may briefly wet your wound in the shower or bath. Do not soak or scrub the wound. Do not swim. Avoid periods of heavy sweating until the skin adhesive has fallen off on its own. After showering or bathing, gently pat the wound dry with a clean towel.   Do not apply liquid medicine, cream medicine, ointment medicine, or makeup to your wound while the skin adhesive is in place. This may loosen the film before your wound is healed.   If a dressing is placed over the wound, be careful not to apply tape directly over the skin adhesive. This may cause the adhesive to be pulled off before the wound is healed.   Avoid   prolonged exposure to sunlight or tanning lamps while the skin adhesive is in place.  The skin adhesive will usually remain in place for 5-10 days, then naturally fall off the skin. Do not pick at the adhesive film.  After Healing: Once the wound has healed, cover the wound with sunscreen during the day for 1 full year. This can help minimize scarring. Exposure to ultraviolet light in the first year will darken the scar. It can take 1-2 years for the scar to lose its redness and to heal completely.  SEEK IMMEDIATE MEDICAL CARE IF:  You have redness, pain, or  swelling around the wound.   You see ayellowish-white fluid (pus) coming from the wound.   You have chills or a fever.  MAKE SURE YOU:  Understand these instructions.  Will watch your condition.  Will get help right away if you are not doing well or get worse. Document Released: 10/10/2004 Document Revised: 06/23/2013 Document Reviewed: 04/15/2013 [employer] Patient Information 2015 [employer]. This information is not intended to replace advice given to you by your health care provider. Make sure you discuss any questions you have with your health care provider.  

## 2015-01-24 ENCOUNTER — Encounter (HOSPITAL_COMMUNITY): Payer: Self-pay | Admitting: Emergency Medicine

## 2015-01-24 ENCOUNTER — Inpatient Hospital Stay (HOSPITAL_COMMUNITY)
Admission: EM | Admit: 2015-01-24 | Discharge: 2015-02-01 | DRG: 682 | Disposition: A | Discharge status: Skilled Nursing Facility | Payer: Medicare Other | Attending: Internal Medicine | Admitting: Internal Medicine

## 2015-01-24 ENCOUNTER — Emergency Department (HOSPITAL_COMMUNITY): Payer: Medicare Other

## 2015-01-24 DIAGNOSIS — E785 Hyperlipidemia, unspecified: Secondary | ICD-10-CM | POA: Diagnosis present

## 2015-01-24 DIAGNOSIS — K219 Gastro-esophageal reflux disease without esophagitis: Secondary | ICD-10-CM | POA: Diagnosis present

## 2015-01-24 DIAGNOSIS — I429 Cardiomyopathy, unspecified: Secondary | ICD-10-CM

## 2015-01-24 DIAGNOSIS — N401 Enlarged prostate with lower urinary tract symptoms: Secondary | ICD-10-CM | POA: Diagnosis present

## 2015-01-24 DIAGNOSIS — N179 Acute kidney failure, unspecified: Principal | ICD-10-CM | POA: Diagnosis present

## 2015-01-24 DIAGNOSIS — F0390 Unspecified dementia without behavioral disturbance: Secondary | ICD-10-CM | POA: Diagnosis present

## 2015-01-24 DIAGNOSIS — N183 Chronic kidney disease, stage 3 (moderate): Secondary | ICD-10-CM | POA: Diagnosis present

## 2015-01-24 DIAGNOSIS — N136 Pyonephrosis: Secondary | ICD-10-CM | POA: Diagnosis present

## 2015-01-24 DIAGNOSIS — N281 Cyst of kidney, acquired: Secondary | ICD-10-CM | POA: Diagnosis present

## 2015-01-24 DIAGNOSIS — I69351 Hemiplegia and hemiparesis following cerebral infarction affecting right dominant side: Secondary | ICD-10-CM | POA: Diagnosis not present

## 2015-01-24 DIAGNOSIS — I129 Hypertensive chronic kidney disease with stage 1 through stage 4 chronic kidney disease, or unspecified chronic kidney disease: Secondary | ICD-10-CM | POA: Diagnosis present

## 2015-01-24 DIAGNOSIS — I5042 Chronic combined systolic (congestive) and diastolic (congestive) heart failure: Secondary | ICD-10-CM | POA: Diagnosis present

## 2015-01-24 DIAGNOSIS — E872 Acidosis, unspecified: Secondary | ICD-10-CM | POA: Diagnosis present

## 2015-01-24 DIAGNOSIS — E876 Hypokalemia: Secondary | ICD-10-CM | POA: Diagnosis not present

## 2015-01-24 DIAGNOSIS — R7989 Other specified abnormal findings of blood chemistry: Secondary | ICD-10-CM

## 2015-01-24 DIAGNOSIS — E860 Dehydration: Secondary | ICD-10-CM | POA: Diagnosis present

## 2015-01-24 DIAGNOSIS — G9340 Encephalopathy, unspecified: Secondary | ICD-10-CM | POA: Diagnosis not present

## 2015-01-24 DIAGNOSIS — R627 Adult failure to thrive: Secondary | ICD-10-CM | POA: Diagnosis present

## 2015-01-24 DIAGNOSIS — F0151 Vascular dementia with behavioral disturbance: Secondary | ICD-10-CM | POA: Diagnosis present

## 2015-01-24 DIAGNOSIS — E875 Hyperkalemia: Secondary | ICD-10-CM | POA: Diagnosis present

## 2015-01-24 DIAGNOSIS — G9349 Other encephalopathy: Secondary | ICD-10-CM | POA: Diagnosis present

## 2015-01-24 DIAGNOSIS — E871 Hypo-osmolality and hyponatremia: Secondary | ICD-10-CM | POA: Diagnosis not present

## 2015-01-24 DIAGNOSIS — N390 Urinary tract infection, site not specified: Secondary | ICD-10-CM | POA: Diagnosis present

## 2015-01-24 DIAGNOSIS — N19 Unspecified kidney failure: Secondary | ICD-10-CM

## 2015-01-24 DIAGNOSIS — R9431 Abnormal electrocardiogram [ECG] [EKG]: Secondary | ICD-10-CM

## 2015-01-24 DIAGNOSIS — F0391 Unspecified dementia with behavioral disturbance: Secondary | ICD-10-CM | POA: Diagnosis not present

## 2015-01-24 DIAGNOSIS — Z789 Other specified health status: Secondary | ICD-10-CM | POA: Diagnosis present

## 2015-01-24 DIAGNOSIS — Z7982 Long term (current) use of aspirin: Secondary | ICD-10-CM | POA: Diagnosis not present

## 2015-01-24 DIAGNOSIS — Z7902 Long term (current) use of antithrombotics/antiplatelets: Secondary | ICD-10-CM | POA: Diagnosis not present

## 2015-01-24 DIAGNOSIS — R32 Unspecified urinary incontinence: Secondary | ICD-10-CM | POA: Diagnosis present

## 2015-01-24 DIAGNOSIS — R945 Abnormal results of liver function studies: Secondary | ICD-10-CM

## 2015-01-24 DIAGNOSIS — N189 Chronic kidney disease, unspecified: Secondary | ICD-10-CM

## 2015-01-24 DIAGNOSIS — R4182 Altered mental status, unspecified: Secondary | ICD-10-CM

## 2015-01-24 DIAGNOSIS — I69320 Aphasia following cerebral infarction: Secondary | ICD-10-CM | POA: Diagnosis not present

## 2015-01-24 DIAGNOSIS — Q549 Hypospadias, unspecified: Secondary | ICD-10-CM

## 2015-01-24 DIAGNOSIS — E119 Type 2 diabetes mellitus without complications: Secondary | ICD-10-CM | POA: Diagnosis present

## 2015-01-24 DIAGNOSIS — D649 Anemia, unspecified: Secondary | ICD-10-CM | POA: Diagnosis present

## 2015-01-24 DIAGNOSIS — I5022 Chronic systolic (congestive) heart failure: Secondary | ICD-10-CM | POA: Diagnosis present

## 2015-01-24 DIAGNOSIS — I255 Ischemic cardiomyopathy: Secondary | ICD-10-CM | POA: Diagnosis present

## 2015-01-24 DIAGNOSIS — Z6825 Body mass index (BMI) 25.0-25.9, adult: Secondary | ICD-10-CM

## 2015-01-24 DIAGNOSIS — Z452 Encounter for adjustment and management of vascular access device: Secondary | ICD-10-CM

## 2015-01-24 DIAGNOSIS — Z87898 Personal history of other specified conditions: Secondary | ICD-10-CM | POA: Diagnosis not present

## 2015-01-24 HISTORY — DX: Presence of urogenital implants: Z96.0

## 2015-01-24 HISTORY — DX: Heart failure, unspecified: I50.9

## 2015-01-24 HISTORY — DX: Presence of other specified devices: Z97.8

## 2015-01-24 HISTORY — DX: Dysphasia: R47.02

## 2015-01-24 HISTORY — DX: Aphasia: R47.01

## 2015-01-24 HISTORY — DX: Gastrointestinal hemorrhage, unspecified: K92.2

## 2015-01-24 HISTORY — DX: Type 2 diabetes mellitus without complications: E11.9

## 2015-01-24 HISTORY — DX: Anemia, unspecified: D64.9

## 2015-01-24 HISTORY — DX: Hemiplegia, unspecified affecting unspecified side: G81.90

## 2015-01-24 HISTORY — DX: Esophagitis, unspecified without bleeding: K20.90

## 2015-01-24 HISTORY — DX: Esophagitis, unspecified: K20.9

## 2015-01-24 HISTORY — DX: Cardiac arrhythmia, unspecified: I49.9

## 2015-01-24 HISTORY — DX: Personal history of other diseases of the digestive system: Z87.19

## 2015-01-24 LAB — COMPREHENSIVE METABOLIC PANEL
ALBUMIN: 2.3 g/dL — AB (ref 3.5–5.0)
ALT: 193 U/L — ABNORMAL HIGH (ref 17–63)
AST: 130 U/L — ABNORMAL HIGH (ref 15–41)
Alkaline Phosphatase: 110 U/L (ref 38–126)
Anion gap: 15 (ref 5–15)
BILIRUBIN TOTAL: 2 mg/dL — AB (ref 0.3–1.2)
BUN: 182 mg/dL — ABNORMAL HIGH (ref 6–20)
CHLORIDE: 114 mmol/L — AB (ref 101–111)
CO2: 12 mmol/L — ABNORMAL LOW (ref 22–32)
Calcium: 8.8 mg/dL — ABNORMAL LOW (ref 8.9–10.3)
Creatinine, Ser: 9.1 mg/dL — ABNORMAL HIGH (ref 0.61–1.24)
GFR calc Af Amer: 6 mL/min — ABNORMAL LOW (ref >60)
GFR calc non Af Amer: 5 mL/min — ABNORMAL LOW (ref >60)
Glucose, Bld: 204 mg/dL — ABNORMAL HIGH (ref 70–99)
POTASSIUM: 6 mmol/L — AB (ref 3.5–5.1)
SODIUM: 141 mmol/L (ref 135–145)
Total Protein: 6.1 g/dL — ABNORMAL LOW (ref 6.5–8.1)

## 2015-01-24 LAB — CBC WITH DIFFERENTIAL/PLATELET
Basophils Absolute: 0 10*3/uL (ref 0.0–0.1)
Basophils Relative: 0 % (ref 0–1)
EOS ABS: 0.2 10*3/uL (ref 0.0–0.7)
EOS PCT: 2 % (ref 0–5)
HCT: 35.5 % — ABNORMAL LOW (ref 39.0–52.0)
Hemoglobin: 12 g/dL — ABNORMAL LOW (ref 13.0–17.0)
Lymphocytes Relative: 9 % — ABNORMAL LOW (ref 12–46)
Lymphs Abs: 0.8 10*3/uL (ref 0.7–4.0)
MCH: 27 pg (ref 26.0–34.0)
MCHC: 33.8 g/dL (ref 30.0–36.0)
MCV: 79.8 fL (ref 78.0–100.0)
Monocytes Absolute: 0.5 10*3/uL (ref 0.1–1.0)
Monocytes Relative: 6 % (ref 3–12)
Neutro Abs: 7 10*3/uL (ref 1.7–7.7)
Neutrophils Relative %: 83 % — ABNORMAL HIGH (ref 43–77)
PLATELETS: 193 10*3/uL (ref 150–400)
RBC: 4.45 MIL/uL (ref 4.22–5.81)
RDW: 16.6 % — AB (ref 11.5–15.5)
WBC: 8.4 10*3/uL (ref 4.0–10.5)

## 2015-01-24 LAB — BLOOD GAS, VENOUS
Acid-base deficit: 12.4 mmol/L — ABNORMAL HIGH (ref 0.0–2.0)
BICARBONATE: 14.2 meq/L — AB (ref 20.0–24.0)
FIO2: 0.21 %
O2 Saturation: 39.9 %
PATIENT TEMPERATURE: 98.5
PH VEN: 7.224 — AB (ref 7.250–7.300)
TCO2: 13.5 mmol/L (ref 0–100)
pCO2, Ven: 35.6 mmHg — ABNORMAL LOW (ref 45.0–50.0)

## 2015-01-24 LAB — URINE MICROSCOPIC-ADD ON

## 2015-01-24 LAB — I-STAT CG4 LACTIC ACID, ED
LACTIC ACID, VENOUS: 0.81 mmol/L (ref 0.5–2.0)
Lactic Acid, Venous: 1.12 mmol/L (ref 0.5–2.0)

## 2015-01-24 LAB — URINALYSIS, ROUTINE W REFLEX MICROSCOPIC
Bilirubin Urine: NEGATIVE
Glucose, UA: NEGATIVE mg/dL
KETONES UR: NEGATIVE mg/dL
NITRITE: NEGATIVE
PH: 6.5 (ref 5.0–8.0)
Protein, ur: 100 mg/dL — AB
SPECIFIC GRAVITY, URINE: 1.013 (ref 1.005–1.030)
UROBILINOGEN UA: 0.2 mg/dL (ref 0.0–1.0)

## 2015-01-24 LAB — MAGNESIUM: MAGNESIUM: 1.5 mg/dL — AB (ref 1.7–2.4)

## 2015-01-24 LAB — PHOSPHORUS: PHOSPHORUS: 2.7 mg/dL (ref 2.5–4.6)

## 2015-01-24 LAB — CBG MONITORING, ED
Glucose-Capillary: 50 mg/dL — ABNORMAL LOW (ref 70–99)
Glucose-Capillary: 91 mg/dL (ref 70–99)
Glucose-Capillary: 119 mg/dL — ABNORMAL HIGH (ref 70–99)

## 2015-01-24 MED ORDER — CLOPIDOGREL BISULFATE 75 MG PO TABS
75.0000 mg | ORAL_TABLET | Freq: Every day | ORAL | Status: DC
  Administered 2015-01-25: 75 mg via ORAL
  Filled 2015-01-24 (×2): qty 1

## 2015-01-24 MED ORDER — SODIUM CHLORIDE 0.9 % IV BOLUS (SEPSIS)
1000.0000 mL | Freq: Once | INTRAVENOUS | Status: AC
  Administered 2015-01-24: 1000 mL via INTRAVENOUS

## 2015-01-24 MED ORDER — CEFTRIAXONE SODIUM 1 G IJ SOLR
1.0000 g | INTRAMUSCULAR | Status: DC
  Administered 2015-01-25 – 2015-01-27 (×4): 1 g via INTRAVENOUS
  Filled 2015-01-24 (×6): qty 10

## 2015-01-24 MED ORDER — SODIUM CHLORIDE 0.9 % IJ SOLN
3.0000 mL | Freq: Two times a day (BID) | INTRAMUSCULAR | Status: DC
  Administered 2015-01-25 – 2015-01-28 (×6): 3 mL via INTRAVENOUS

## 2015-01-24 MED ORDER — ISOSORB DINITRATE-HYDRALAZINE 20-37.5 MG PO TABS
1.0000 | ORAL_TABLET | Freq: Three times a day (TID) | ORAL | Status: DC
  Administered 2015-01-25: 1 via ORAL
  Filled 2015-01-24 (×3): qty 1

## 2015-01-24 MED ORDER — FERROUS SULFATE 325 (65 FE) MG PO TABS
325.0000 mg | ORAL_TABLET | Freq: Two times a day (BID) | ORAL | Status: DC
  Administered 2015-01-25 – 2015-02-01 (×10): 325 mg via ORAL
  Filled 2015-01-24 (×17): qty 1

## 2015-01-24 MED ORDER — ENSURE ENLIVE PO LIQD
237.0000 mL | Freq: Two times a day (BID) | ORAL | Status: DC
  Administered 2015-01-30 – 2015-02-01 (×3): 237 mL via ORAL
  Filled 2015-01-24 (×2): qty 237

## 2015-01-24 MED ORDER — DEXTROSE 50 % IV SOLN
INTRAVENOUS | Status: AC
  Administered 2015-01-24: 50 mL via INTRAVENOUS
  Filled 2015-01-24: qty 50

## 2015-01-24 MED ORDER — PANTOPRAZOLE SODIUM 40 MG PO TBEC
40.0000 mg | DELAYED_RELEASE_TABLET | Freq: Two times a day (BID) | ORAL | Status: DC
  Administered 2015-01-25: 40 mg via ORAL
  Filled 2015-01-24: qty 1

## 2015-01-24 MED ORDER — VENLAFAXINE HCL ER 150 MG PO CP24
150.0000 mg | ORAL_CAPSULE | Freq: Every day | ORAL | Status: DC
  Administered 2015-01-25: 150 mg via ORAL
  Filled 2015-01-24 (×3): qty 1

## 2015-01-24 MED ORDER — SODIUM CHLORIDE 0.9 % IV SOLN
1.0000 g | Freq: Once | INTRAVENOUS | Status: AC
  Administered 2015-01-24: 1 g via INTRAVENOUS
  Filled 2015-01-24: qty 10

## 2015-01-24 MED ORDER — ENOXAPARIN SODIUM 30 MG/0.3ML ~~LOC~~ SOLN
30.0000 mg | SUBCUTANEOUS | Status: DC
  Administered 2015-01-25 – 2015-01-31 (×8): 30 mg via SUBCUTANEOUS
  Filled 2015-01-24 (×9): qty 0.3

## 2015-01-24 MED ORDER — METOPROLOL SUCCINATE ER 25 MG PO TB24
200.0000 mg | ORAL_TABLET | Freq: Every day | ORAL | Status: DC
  Filled 2015-01-24: qty 2

## 2015-01-24 MED ORDER — ACETAMINOPHEN 325 MG PO TABS
650.0000 mg | ORAL_TABLET | Freq: Four times a day (QID) | ORAL | Status: DC | PRN
  Administered 2015-01-29: 650 mg via ORAL
  Filled 2015-01-24: qty 2

## 2015-01-24 MED ORDER — DEXTROSE 50 % IV SOLN
50.0000 mL | Freq: Once | INTRAVENOUS | Status: AC
  Administered 2015-01-24: 50 mL via INTRAVENOUS
  Filled 2015-01-24: qty 50

## 2015-01-24 MED ORDER — SODIUM CHLORIDE 0.9 % IV SOLN
INTRAVENOUS | Status: DC
  Administered 2015-01-24: 22:00:00 via INTRAVENOUS

## 2015-01-24 MED ORDER — DEXTROSE 50 % IV SOLN
25.0000 g | Freq: Once | INTRAVENOUS | Status: AC
  Administered 2015-01-24: 50 mL via INTRAVENOUS

## 2015-01-24 MED ORDER — DOCUSATE SODIUM 100 MG PO CAPS
100.0000 mg | ORAL_CAPSULE | Freq: Two times a day (BID) | ORAL | Status: DC
  Administered 2015-01-28 – 2015-02-01 (×9): 100 mg via ORAL
  Filled 2015-01-24 (×12): qty 1

## 2015-01-24 MED ORDER — INSULIN ASPART 100 UNIT/ML IV SOLN
4.0000 [IU] | Freq: Once | INTRAVENOUS | Status: AC
  Administered 2015-01-24: 4 [IU] via INTRAVENOUS
  Filled 2015-01-24: qty 0.04

## 2015-01-24 MED ORDER — SODIUM BICARBONATE 8.4 % IV SOLN
INTRAVENOUS | Status: DC
  Administered 2015-01-24 – 2015-01-26 (×4): via INTRAVENOUS
  Filled 2015-01-24 (×11): qty 1000

## 2015-01-24 MED ORDER — ONDANSETRON HCL 4 MG PO TABS: 4.0000 mg | ORAL_TABLET | Freq: Four times a day (QID) | ORAL | Status: DC | PRN

## 2015-01-24 MED ORDER — METOCLOPRAMIDE HCL 10 MG PO TABS
10.0000 mg | ORAL_TABLET | Freq: Three times a day (TID) | ORAL | Status: DC
  Administered 2015-01-25: 10 mg via ORAL
  Filled 2015-01-24: qty 1

## 2015-01-24 MED ORDER — ONDANSETRON HCL 4 MG/2ML IJ SOLN: 4.0000 mg | Freq: Four times a day (QID) | INTRAMUSCULAR | Status: DC | PRN

## 2015-01-24 MED ORDER — SODIUM BICARBONATE 8.4 % IV SOLN
50.0000 meq | Freq: Once | INTRAVENOUS | Status: AC
  Administered 2015-01-24: 50 meq via INTRAVENOUS
  Filled 2015-01-24: qty 50

## 2015-01-24 MED ORDER — MEMANTINE HCL 5 MG PO TABS
10.0000 mg | ORAL_TABLET | Freq: Two times a day (BID) | ORAL | Status: DC
  Administered 2015-01-25 (×2): 10 mg via ORAL
  Filled 2015-01-24 (×2): qty 1

## 2015-01-24 MED ORDER — ACETAMINOPHEN 650 MG RE SUPP: 650.0000 mg | Freq: Four times a day (QID) | RECTAL | Status: DC | PRN

## 2015-01-24 MED ORDER — ASPIRIN 81 MG PO CHEW
81.0000 mg | CHEWABLE_TABLET | Freq: Every day | ORAL | Status: DC
  Filled 2015-01-24: qty 1

## 2015-01-24 MED ORDER — DEXTROSE 5 % IV SOLN
1.0000 g | Freq: Once | INTRAVENOUS | Status: AC
  Administered 2015-01-24: 1 g via INTRAVENOUS
  Filled 2015-01-24: qty 10

## 2015-01-24 MED ORDER — HYDRALAZINE HCL 20 MG/ML IJ SOLN
10.0000 mg | INTRAMUSCULAR | Status: DC | PRN
  Administered 2015-01-26: 10 mg via INTRAVENOUS
  Filled 2015-01-24: qty 1

## 2015-01-24 MED ORDER — TAMSULOSIN HCL 0.4 MG PO CAPS
0.4000 mg | ORAL_CAPSULE | Freq: Every day | ORAL | Status: DC
  Administered 2015-01-25: 0.4 mg via ORAL
  Filled 2015-01-24: qty 1

## 2015-01-24 NOTE — Consult Note (Signed)
[name] Admit Date: 01/24/2015 01/24/2015 Rexene Agent Requesting Physician:  [name] MD  Reason for Consult:  AKI, Azotemia, Hyperkalemia, Acidosis, AMS HPI:  47M seen at request of Dr. [name] for the evaluation of the above-mentioned problems. Patient currently with altered mental status and unable to contribute to evaluation. Patient is a resident of [facility] skilled nursing facility. He was transferred after finding acute renal failure and likely urinary tract infection. 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 Historical trend in serum creatinine charted below. Home medications notable for clonidine, Flomax. Labs are notable for acute kidney injury with serum creatinine of 9.1, BUN of 182, serum bicarbonate of 12 with anion gap of 15, potassium of 6.0. Transaminases are elevated. Bilirubin is increased to 2.0. He had a Foley catheter placed upon arrival, 1.2 L of purulent urine was strained. EKG with old left bundle branch block, normal PR interval, no peaked T's. Chest x-ray with no acute abnormalities. Both reviewed by me. He has been treated with antibiotics at the nursing facility before transfer, unknown which antibiotic was used.  In the emergency room he has received 2 L of normal saline, ceftriaxone, 1 g of calcium gluconate, dextrose and insulin IV. He is hiccuping, obtunded, unable to converse.   CREATININE, SER (mg/dL)  Date Value  01/24/2015 9.10*  06/17/2014 1.65*  01/20/2014 1.39*  01/19/2014 1.37*  01/18/2014 1.43*  01/17/2014 1.41*  01/16/2014 1.45*  01/15/2014 1.56*  01/14/2014 1.33  01/13/2014 1.34  ] I/Os: 1.2L UOP upon placement of Foley  ROS NSAIDS: No obvious exposure IV Contrast no exposure identified TMP/SMX no clear exposure Hypotension no exposure Balance of 12 systems is negative w/ exceptions as above  PMH  Past Medical History   Diagnosis Date  . Dementia   . Hypertension   . Hyperlipemia   . GERD (gastroesophageal reflux disease)   . BPH (benign prostatic hyperplasia)   . Cardiomyopathy: Per 2 D echo 10/22/13 EF 30-35% 01/08/2014  . Hydronephrosis   . Stroke   . Hemiparesis     right hemiparesis  . Dysphasia   . Aphasia     expressive aphasia  . Hydronephrosis   . History of hiatal hernia   . Esophagitis   . Upper GI bleed   . Dysrhythmia     hx nonsustained vt  . Anemia   . CHF (congestive heart failure)   . Chronic indwelling Foley catheter   . Diabetes mellitus without complication    PSH  Past Surgical History  Procedure Laterality Date  . Esophagogastroduodenoscopy N/A 11/11/2012    Procedure: ESOPHAGOGASTRODUODENOSCOPY (EGD);  Surgeon: Lear Ng, MD;  Location[facility] ENDOSCOPY;  Service: Endoscopy;  Laterality: N/A;  . Subxyphoid pericardial window N/A 11/13/2012    Procedure: SUBXYPHOID PERICARDIAL WINDOW;  Surgeon: Grace Isaac, MD;  Location: [place];  Service: Thoracic;  Laterality: N/A;  . Esophagogastroduodenoscopy N/A 11/25/2012    Procedure: ESOPHAGOGASTRODUODENOSCOPY (EGD);  Surgeon: Winfield Cunas., MD;  Location: [facility] ENDOSCOPY;  Service: Endoscopy;  Laterality: N/A;  . Esophagogastroduodenoscopy N/A 01/13/2014    Procedure: ESOPHAGOGASTRODUODENOSCOPY (EGD);  Surgeon: Wonda Horner, MD;  Location: [facility] ENDOSCOPY;  Service: Endoscopy;  Laterality: N/A;  . Peg placement    . Aicd implantation     FH History reviewed. No pertinent family history. SH  reports that he has never smoked. He has never used smokeless tobacco. He reports that he does not  drink alcohol or use illicit drugs. Allergies No Known Allergies Home medications Prior to Admission medications   Medication Sig Start Date End Date Taking? Authorizing Provider  lactose free nutrition (BOOST) LIQD Take 237 mLs by mouth 2 (two) times daily between meals.   Yes Historical Provider, MD  amiodarone (PACERONE) 200 MG  tablet Take 1 tablet (200 mg total) by mouth 2 (two) times daily. 01/20/14   Bonnielee Haff, MD  aspirin 81 MG chewable tablet Chew 81 mg by mouth daily.    Historical Provider, MD  atorvastatin (LIPITOR) 20 MG tablet Take 1 tablet (20 mg total) by mouth daily at 6 PM. 12/04/12   Belkys A Regalado, MD  cefpodoxime (VANTIN) 100 MG tablet Take 1 tablet (100 mg total) by mouth 2 (two) times daily. 06/17/14   Evelina Bucy, MD  cloNIDine (CATAPRES) 0.2 MG tablet Take 0.5 tablets (0.1 mg total) by mouth 2 (two) times daily as needed. FOR SBP GREATER THAN 160. IF BP REMAINS ELEVATED CONSISTENTLY, CONSIDER SCHEDULED DOSING. 01/20/14   Bonnielee Haff, MD  clopidogrel (PLAVIX) 75 MG tablet Take 1 tablet (75 mg total) by mouth daily with breakfast. PLEASE RESUME ON 01/28/14 01/20/14   Bonnielee Haff, MD  docusate sodium (COLACE) 100 MG capsule Take 100 mg by mouth 2 (two) times daily.    Historical Provider, MD  feeding supplement, ENSURE COMPLETE, (ENSURE COMPLETE) LIQD Take 237 mLs by mouth 2 (two) times daily between meals. 01/20/14   Bonnielee Haff, MD  ferrous sulfate 325 (65 FE) MG tablet Take 325 mg by mouth 2 (two) times daily with a meal.    Historical Provider, MD  isosorbide-hydrALAZINE (BIDIL) 20-37.5 MG per tablet Take 1 tablet by mouth 3 (three) times daily. 01/20/14   Bonnielee Haff, MD  lactulose (CHRONULAC) 10 GM/15ML solution Take 30 mLs by mouth 2 (two) times daily.    Historical Provider, MD  memantine (NAMENDA) 10 MG tablet Take 10 mg by mouth 2 (two) times daily.    Historical Provider, MD  metoCLOPramide (REGLAN) 10 MG tablet Take 1 tablet (10 mg total) by mouth 4 (four) times daily -  before meals and at bedtime. 10/25/13   Cherene Altes, MD  metoprolol succinate (TOPROL-XL) 200 MG 24 hr tablet Take 1 tablet (200 mg total) by mouth daily. Take with or immediately following a meal. 11/20/12   Nita Sells, MD  pantoprazole (PROTONIX) 40 MG tablet Take 1 tablet (40 mg total) by mouth 2 (two) times  daily before a meal. 10/25/13   Cherene Altes, MD  polyethylene glycol [facility] Floria Raveling) packet Take 17 g by mouth daily. 12/04/12   Belkys A Regalado, MD  Polyvinyl Alcohol-Povidone 2.7-2 % SOLN Apply 1 drop to eye 4 (four) times daily.    Historical Provider, MD  tamsulosin (FLOMAX) 0.4 MG CAPS Take 0.4 mg by mouth daily.    Historical Provider, MD  venlafaxine XR (EFFEXOR-XR) 150 MG 24 hr capsule Take 150 mg by mouth daily with breakfast.    Historical Provider, MD    Current Medications Scheduled Meds: . dextrose  50 mL Intravenous Once  . insulin aspart  4 Units Intravenous Once  . sodium bicarbonate  50 mEq Intravenous Once   Continuous Infusions: . sodium chloride    . calcium gluconate 1 GM IV     PRN Meds:.  CBC  Recent Labs Lab 01/24/15 1912  WBC 8.4  NEUTROABS 7.0  HGB 12.0*  HCT 35.5*  MCV 79.8  PLT 193  Basic Metabolic Panel  Recent Labs Lab 01/24/15 1912  NA 141  K 6.0*  CL 114*  CO2 12*  GLUCOSE 204*  BUN 182*  CREATININE 9.10*  CALCIUM 8.8*    Physical Exam  Blood pressure 136/60, pulse 75, temperature 98.5 F (36.9 C), temperature source Rectal, resp. rate 20, SpO2 93 %. GEN: obtunded, nonconversant ENT: NCAT EYES: eyes closed CV: RRR, no rub PULM: CTAB ABD: s/nt/nd, no sp fullness SKIN: no rashes/lesions EXT:No LEE   Assessment/Plan 21M w/ AoCKD, purulent UTI, AMS, Hyperkalemia, Metabolic Acidosis  1. AoCKD  1. Most Rcent SCr 1.4-1.6, known DM2 and HTN 2. Likely Prerenal + purulent UTI as  Acute cause; can't exclude progression of CKD 3. CT A/P ordered, will give assessment for acute structural renal issues 4. Patient likely uremic has contributed to his mental status; hopefully this will clear as the prerenal insult and infection is treated 5. If this does not occur, likely to need hemodialysis on at least a short-term basis 6. Treat with D5W + 162mEq NaHCO3 at 243mL/hr 7. Foley in place, on Flomax 8. BMP overnight and again  in AM Daily weights, Daily Renal Panel, Strict I/Os, Avoid nephrotoxins (NSAIDs, judicious IV Contrast) 1. Hyperkalemia 1. No EKG chagnes, likely 2/2 #1 2. Treated with CaGluconate, Insulin/ HCO3 so far 3. Should further improve with IVF as above 4. Would not give Kayexlate unless K increases, then would need to be PR 2. Metabolic Acidosis, AG 20, likely 2/2 #1 1. IVFs as above 3. PUrulent UTI 1. Rec ceftriaxone 2. Follow Cultures 3. CT ordered, Foley placed 4. Elevated LFTs 5. Dementia, likely vascular; hx/o CVA 1. unclear baseline mental status 2. Follow 6. HTN:  1. not on ACEi/ARB/MRB 2. Follow for clonidine rebound if NPO 7. AMS: multifactorial   Pearson Grippe MD [phone] pgr 01/24/2015, 9:18 PM

## 2015-01-24 NOTE — ED Notes (Signed)
Per EMS: Pt from [facility].  Pt has elevated BUN and creatinine levels.  Hx of CKD.

## 2015-01-24 NOTE — H&P (Addendum)
[facility] History and Physical  [name] VZC:[id number] DOB: [birth year] DOA: 01/24/2015  Referring physician: Dr. [name]. PCP: Merlene Laughter, MD  Specialists: None.  Chief Complaint: Abnormal labs.  HPI: [name] is a 69 y.o. male with history of stroke with right-sided hemiplegia, dementia, chronic kidney disease creatinine at baseline is around 1.4, hypertension, cardiomyopathy last EF measured was 30% was referred to the ER because of abnormal labs. As per patient's niece patient was treated for UTI over the last 2 days and has been having poor appetite over the last 2 days and had one episode of nausea vomiting. Patient also was found to be increasingly confused. In the ER patient was found to have markedly elevated creatinine from his baseline with metabolic acidosis and hyperkalemia. On call urologist Dr. Baird Cancer was consulted. Patient is making urine and urine is having was pus colored. On exam patient is confused and is oriented to his name only. Patient was given calcium gluconate bicarbonate for his hyperkalemia. CT abdomen and pelvis is pending. Patient's labs also show elevated LFTs. UA is consistent with UTI.   Review of Systems: As presented in the history of presenting illness, rest negative.  Past Medical History  Diagnosis Date  . Dementia   . Hypertension   . Hyperlipemia   . GERD (gastroesophageal reflux disease)   . BPH (benign prostatic hyperplasia)   . Cardiomyopathy: Per 2 D echo 10/22/13 EF 30-35% 01/08/2014  . Hydronephrosis   . Stroke   . Hemiparesis     right hemiparesis  . Dysphasia   . Aphasia     expressive aphasia  . Hydronephrosis   . History of hiatal hernia   . Esophagitis   . Upper GI bleed   . Dysrhythmia     hx nonsustained vt  . Anemia   . CHF (congestive heart failure)   . Chronic indwelling Foley catheter   . Diabetes mellitus without complication    Past Surgical History  Procedure Laterality Date  .  Esophagogastroduodenoscopy N/A 11/11/2012    Procedure: ESOPHAGOGASTRODUODENOSCOPY (EGD);  Surgeon: Lear Ng, MD;  Location: [facility] ENDOSCOPY;  Service: Endoscopy;  Laterality: N/A;  . Subxyphoid pericardial window N/A 11/13/2012    Procedure: SUBXYPHOID PERICARDIAL WINDOW;  Surgeon: Grace Isaac, MD;  Location: [place];  Service: Thoracic;  Laterality: N/A;  . Esophagogastroduodenoscopy N/A 11/25/2012    Procedure: ESOPHAGOGASTRODUODENOSCOPY (EGD);  Surgeon: Winfield Cunas., MD;  Location: [facility] ENDOSCOPY;  Service: Endoscopy;  Laterality: N/A;  . Esophagogastroduodenoscopy N/A 01/13/2014    Procedure: ESOPHAGOGASTRODUODENOSCOPY (EGD);  Surgeon: Wonda Horner, MD;  Location: [facility] ENDOSCOPY;  Service: Endoscopy;  Laterality: N/A;  . Peg placement    . Aicd implantation     Social History:  reports that he has never smoked. He has never used smokeless tobacco. He reports that he does not drink alcohol or use illicit drugs. Where does patient live nursing home. Can patient participate in ADLs? No.  No Known Allergies  Family History:  Family History  Problem Relation Age of Onset  . CAD Father   . Hypertension Brother       Prior to Admission medications   Medication Sig Start Date End Date Taking? Authorizing Provider  lactose free nutrition (BOOST) LIQD Take 237 mLs by mouth 2 (two) times daily between meals.   Yes Historical Provider, MD  amiodarone (PACERONE) 200 MG tablet Take 1 tablet (200 mg total) by mouth 2 (two) times daily. 01/20/14  Bonnielee Haff, MD  aspirin 81 MG chewable tablet Chew 81 mg by mouth daily.    Historical Provider, MD  atorvastatin (LIPITOR) 20 MG tablet Take 1 tablet (20 mg total) by mouth daily at 6 PM. 12/04/12   Belkys A Regalado, MD  cefpodoxime (VANTIN) 100 MG tablet Take 1 tablet (100 mg total) by mouth 2 (two) times daily. 06/17/14   Evelina Bucy, MD  cloNIDine (CATAPRES) 0.2 MG tablet Take 0.5 tablets (0.1 mg total) by mouth 2 (two) times daily as  needed. FOR SBP GREATER THAN 160. IF BP REMAINS ELEVATED CONSISTENTLY, CONSIDER SCHEDULED DOSING. 01/20/14   Bonnielee Haff, MD  clopidogrel (PLAVIX) 75 MG tablet Take 1 tablet (75 mg total) by mouth daily with breakfast. PLEASE RESUME ON 01/28/14 01/20/14   Bonnielee Haff, MD  docusate sodium (COLACE) 100 MG capsule Take 100 mg by mouth 2 (two) times daily.    Historical Provider, MD  feeding supplement, ENSURE COMPLETE, (ENSURE COMPLETE) LIQD Take 237 mLs by mouth 2 (two) times daily between meals. 01/20/14   Bonnielee Haff, MD  ferrous sulfate 325 (65 FE) MG tablet Take 325 mg by mouth 2 (two) times daily with a meal.    Historical Provider, MD  isosorbide-hydrALAZINE (BIDIL) 20-37.5 MG per tablet Take 1 tablet by mouth 3 (three) times daily. 01/20/14   Bonnielee Haff, MD  lactulose (CHRONULAC) 10 GM/15ML solution Take 30 mLs by mouth 2 (two) times daily.    Historical Provider, MD  memantine (NAMENDA) 10 MG tablet Take 10 mg by mouth 2 (two) times daily.    Historical Provider, MD  metoCLOPramide (REGLAN) 10 MG tablet Take 1 tablet (10 mg total) by mouth 4 (four) times daily -  before meals and at bedtime. 10/25/13   Cherene Altes, MD  metoprolol succinate (TOPROL-XL) 200 MG 24 hr tablet Take 1 tablet (200 mg total) by mouth daily. Take with or immediately following a meal. 11/20/12   Nita Sells, MD  pantoprazole (PROTONIX) 40 MG tablet Take 1 tablet (40 mg total) by mouth 2 (two) times daily before a meal. 10/25/13   Cherene Altes, MD  polyethylene glycol [facility] / Floria Raveling) packet Take 17 g by mouth daily. 12/04/12   Belkys A Regalado, MD  Polyvinyl Alcohol-Povidone 2.7-2 % SOLN Apply 1 drop to eye 4 (four) times daily.    Historical Provider, MD  tamsulosin (FLOMAX) 0.4 MG CAPS Take 0.4 mg by mouth daily.    Historical Provider, MD  venlafaxine XR (EFFEXOR-XR) 150 MG 24 hr capsule Take 150 mg by mouth daily with breakfast.    Historical Provider, MD    Physical Exam: Filed Vitals:    01/24/15 2051 01/24/15 2100 01/24/15 2130 01/24/15 2144  BP: 136/60 128/69 123/80 123/80  Pulse: 75 74 74 78  Temp:      TempSrc:      Resp: 20 17 18 24   SpO2: 93% 92% 93% 92%     General:  Moderately built and poorly nourished.  Eyes: Anicteric no pallor.  ENT: No discharge from the ears eyes nose or mouth.  Neck: No mass felt.  Cardiovascular: S1-S2 heard.  Respiratory: No rhonchi or crepitations.  Abdomen: Soft nontender bowel sounds present.  Skin: No rash.  Musculoskeletal: No edema.  Psychiatric: Patient is oriented to his name.  Neurologic: Alert awake oriented to his name only. Right-sided hemiparesis.  Labs on Admission:  Basic Metabolic Panel:  Recent Labs Lab 01/24/15 1912  NA 141  K 6.0*  CL 114*  CO2 12*  GLUCOSE 204*  BUN 182*  CREATININE 9.10*  CALCIUM 8.8*  MG 1.5*  PHOS 2.7   Liver Function Tests:  Recent Labs Lab 01/24/15 1912  AST 130*  ALT 193*  ALKPHOS 110  BILITOT 2.0*  PROT 6.1*  ALBUMIN 2.3*   No results for input(s): LIPASE, AMYLASE in the last 168 hours. No results for input(s): AMMONIA in the last 168 hours. CBC:  Recent Labs Lab 01/24/15 1912  WBC 8.4  NEUTROABS 7.0  HGB 12.0*  HCT 35.5*  MCV 79.8  PLT 193   Cardiac Enzymes: No results for input(s): CKTOTAL, CKMB, CKMBINDEX, TROPONINI in the last 168 hours.  BNP (last 3 results) No results for input(s): BNP in the last 8760 hours.  ProBNP (last 3 results) No results for input(s): PROBNP in the last 8760 hours.  CBG:  Recent Labs Lab 01/24/15 1824 01/24/15 2114  GLUCAP 50* 91    Radiological Exams on Admission: Dg Chest Port 1 View  01/24/2015   CLINICAL DATA:  Unwitnessed fall this morning.  EXAM: PORTABLE CHEST - 1 VIEW  COMPARISON:  01/08/2014  FINDINGS: There is moderate unchanged cardiomegaly. There is no confluent airspace consolidation. There is no large effusion. Hilar and mediastinal contours are unremarkable and unchanged.  IMPRESSION:  Cardiomegaly.  No acute cardiopulmonary findings.   Electronically Signed   By: Andreas Newport M.D.   On: 01/24/2015 20:26    EKG: Independently reviewed. Sinus rhythm with LBBB.  Assessment/Plan Active Problems:   Dementia   Hemiparesis affecting right side as late effect of stroke   Acute-on-chronic renal failure   Dyslipidemia   Cardiomyopathy: Per 2 D echo 10/22/13 EF 30-35%   Acute encephalopathy   Metabolic acidosis   UTI (lower urinary tract infection)   Renal failure (ARF), acute on chronic   1. Acute on chronic kidney disease with metabolic acidosis and hyperkalemia, renal failure is not oliguric suspect prerenal - patient has already received calcium gluconate and bicarbonate for his hyperkalemia. Patient also has received 2 L normal saline in the ER. I have discussed with Dr. Baird Cancer on call nephrologist. At this time Dr. Baird Cancer has advised me to start patient on bicarbonate drip as patient has hyperkalemia and metabolic acidosis. CT abdomen and pelvis is pending to check for any obstructive lesions at the same the patient also has elevated LFTs. Closely follow intake and output metabolic panel. 2. UTI - patient has been placed on ceftriaxone. Follow urine cultures. Follow CT abdomen and pelvis. 3. Acute encephalopathy most likely secondary to metabolic reasons (uremia) and also possibly secondary to UTI. Since patient has elevated LFTs checked ammonia levels. 4. Elevated LFTs - abdomen appears benign at this time. Follow LFTs closely. Check acute hepatitis panel. Hold amiodarone and statin for now. 5. History of cardiomyopathy with last EF 30% in February 2015 - presently patient appears dehydrated. Patient is receiving fluids. Closely follow respiratory status. Holding of amiodarone secondary to #4. 6. Anemia - probably from renal disease. Follow CBC. 7. Dementia - continue home medications. 8. History of stroke on antiplatelet agents. 9. Hypertension - continue home  medications and I have placed patient on when necessary IV hydralazine.  I have instructed patient's nurse not to get anything orally until patient is more alert and awake.  Patient will be transferred to [facility]. Patient's niece is in agreement. Dr. Posey Pronto will be the accepting physician.  Addendum - CT abdomen and pelvis shows left-sided hydronephrosis with hydroureter. I have discussed  with Dr. Tresa Moore on-call urologist who will be seeing patient in consult.   DVT Prophylaxis Lovenox.  Code Status: Full code.  Family Communication: Patient's niece.  Disposition Plan: Admit to inpatient. Likely stay would be 3-4 days.    Ahmira Boisselle N. [facility] Pager [phone].  If 7PM-7AM, please contact night-coverage [email or web address] Password [facility] 01/24/2015, 10:34 PM

## 2015-01-24 NOTE — ED Notes (Signed)
Bed: MM76 Expected date:  Expected time:  Means of arrival:  Comments: Abnormal BUN/creat from SNF

## 2015-01-24 NOTE — Progress Notes (Signed)
ANTIBIOTIC CONSULT NOTE - INITIAL  Pharmacy Consult for Rocephin Indication: UTI  No Known Allergies  Patient Measurements:   Wt=87 kg  Vital Signs: Temp: 98.5 F (36.9 C) (05/10 1910) Temp Source: Rectal (05/10 1910) BP: 116/53 mmHg (05/10 2300) Pulse Rate: 74 (05/10 2300) Intake/Output from previous day:   Intake/Output from this shift:    Labs:  Recent Labs  01/24/15 1912  WBC 8.4  HGB 12.0*  PLT 193  CREATININE 9.10*   CrCl cannot be calculated (Unknown ideal weight.). No results for input(s): VANCOTROUGH, VANCOPEAK, VANCORANDOM, GENTTROUGH, GENTPEAK, GENTRANDOM, TOBRATROUGH, TOBRAPEAK, TOBRARND, AMIKACINPEAK, AMIKACINTROU, AMIKACIN in the last 72 hours.   Microbiology: No results found for this or any previous visit (from the past 720 hour(s)).  Medical History: Past Medical History  Diagnosis Date  . Dementia   . Hypertension   . Hyperlipemia   . GERD (gastroesophageal reflux disease)   . BPH (benign prostatic hyperplasia)   . Cardiomyopathy: Per 2 D echo 10/22/13 EF 30-35% 01/08/2014  . Hydronephrosis   . Stroke   . Hemiparesis     right hemiparesis  . Dysphasia   . Aphasia     expressive aphasia  . Hydronephrosis   . History of hiatal hernia   . Esophagitis   . Upper GI bleed   . Dysrhythmia     hx nonsustained vt  . Anemia   . CHF (congestive heart failure)   . Chronic indwelling Foley catheter   . Diabetes mellitus without complication     Medications:   (Not in a hospital admission) Scheduled:  . [START ON 01/25/2015] aspirin  81 mg Oral Daily  . [START ON 01/25/2015] clopidogrel  75 mg Oral Q breakfast  . docusate sodium  100 mg Oral BID  . enoxaparin (LOVENOX) injection  30 mg Subcutaneous Q24H  . [START ON 01/25/2015] feeding supplement (ENSURE COMPLETE)  237 mL Oral BID BM  . [START ON 01/25/2015] ferrous sulfate  325 mg Oral BID WC  . isosorbide-hydrALAZINE  1 tablet Oral TID  . memantine  10 mg Oral BID  . [START ON 01/25/2015]  metoCLOPramide  10 mg Oral TID AC & HS  . [START ON 01/25/2015] metoprolol succinate  200 mg Oral Daily  . [START ON 01/25/2015] pantoprazole  40 mg Oral BID AC  . sodium chloride  3 mL Intravenous Q12H  . [START ON 01/25/2015] tamsulosin  0.4 mg Oral Daily  . [START ON 01/25/2015] venlafaxine XR  150 mg Oral Q breakfast   Infusions:  . [START ON 01/25/2015] cefTRIAXone (ROCEPHIN)  IV    . dextrose 5 % 1,000 mL with sodium bicarbonate 150 mEq infusion 200 mL/hr at 01/24/15 2309   Assessment: 70 yoM present to ED with altered mental status found to have UTI.  Rocephin per Rx for UTI.   Goal of Therapy:  Treat UTI  Plan:   Rocephin 1Gm IV q24h  F/u cultures  Dorrene German 01/24/2015,11:31 PM

## 2015-01-24 NOTE — ED Notes (Addendum)
Per EMS pt was given prophylactic antibiotics at nursing facility for [facility] UTIs.  The facility stated they were waiting on results of urinalysis that was sent off on pt.   Abnormal Lab work that was collected today: Potassium 5.4 CL 112 CO214.5 BUN >130 CRT8.49

## 2015-01-24 NOTE — ED Provider Notes (Signed)
CSN: [id number]     Arrival date & time 01/24/15  1812 History   First MD Initiated Contact with Patient 01/24/15 1837     Chief Complaint  Patient presents with  . Abnormal Labs      (Consider location/radiation/quality/duration/timing/severity/associated sxs/prior Treatment) HPI Comments: 69 year old male with history of dementia, high blood pressure, stroke with right hemiparesis, aphasia, chronic renal failure, pneumonia, cholesterol presents from the nursing home for possible urine infection and abnormal labs. Per report patient had significant worsening of his kidney function and was treated prophylactically with antibiotics unsure which antibiotic. Per report patient is at baseline mental status. No fevers or chills at the nursing home. Difficulty obtaining detailed history due to dementia.  The history is provided by the patient.    Past Medical History  Diagnosis Date  . Dementia   . Hypertension   . Hyperlipemia   . GERD (gastroesophageal reflux disease)   . BPH (benign prostatic hyperplasia)   . Cardiomyopathy: Per 2 D echo 10/22/13 EF 30-35% 01/08/2014  . Hydronephrosis   . Stroke   . Hemiparesis     right hemiparesis  . Dysphasia   . Aphasia     expressive aphasia  . Hydronephrosis   . History of hiatal hernia   . Esophagitis   . Upper GI bleed   . Dysrhythmia     hx nonsustained vt  . Anemia   . CHF (congestive heart failure)   . Chronic indwelling Foley catheter   . Diabetes mellitus without complication    Past Surgical History  Procedure Laterality Date  . Esophagogastroduodenoscopy N/A 11/11/2012    Procedure: ESOPHAGOGASTRODUODENOSCOPY (EGD);  Surgeon: Lear Ng, MD;  Location: [facility] ENDOSCOPY;  Service: Endoscopy;  Laterality: N/A;  . Subxyphoid pericardial window N/A 11/13/2012    Procedure: SUBXYPHOID PERICARDIAL WINDOW;  Surgeon: Grace Isaac, MD;  Location: [facility];  Service: Thoracic;  Laterality: N/A;  . Esophagogastroduodenoscopy N/A  11/25/2012    Procedure: ESOPHAGOGASTRODUODENOSCOPY (EGD);  Surgeon: Winfield Cunas., MD;  Location: [facility] ENDOSCOPY;  Service: Endoscopy;  Laterality: N/A;  . Esophagogastroduodenoscopy N/A 01/13/2014    Procedure: ESOPHAGOGASTRODUODENOSCOPY (EGD);  Surgeon: Wonda Horner, MD;  Location: [facility] ENDOSCOPY;  Service: Endoscopy;  Laterality: N/A;  . Peg placement    . Aicd implantation     Family History  Problem Relation Age of Onset  . CAD Father   . Hypertension Brother    History  Substance Use Topics  . Smoking status: Never Smoker   . Smokeless tobacco: Never Used  . Alcohol Use: No    Review of Systems  Unable to perform ROS: Dementia      Allergies  Review of patient's allergies indicates no known allergies.  Home Medications   Prior to Admission medications   Medication Sig Start Date End Date Taking? Authorizing Provider  lactose free nutrition (BOOST) LIQD Take 237 mLs by mouth 2 (two) times daily between meals.   Yes Historical Provider, MD  amiodarone (PACERONE) 200 MG tablet Take 1 tablet (200 mg total) by mouth 2 (two) times daily. 01/20/14   Bonnielee Haff, MD  aspirin 81 MG chewable tablet Chew 81 mg by mouth daily.    Historical Provider, MD  atorvastatin (LIPITOR) 20 MG tablet Take 1 tablet (20 mg total) by mouth daily at 6 PM. 12/04/12   Belkys A Regalado, MD  cefpodoxime (VANTIN) 100 MG tablet Take 1 tablet (100 mg total) by mouth 2 (two) times daily. 06/17/14   Benjie Karvonen  Mingo Amber, MD  cloNIDine (CATAPRES) 0.2 MG tablet Take 0.5 tablets (0.1 mg total) by mouth 2 (two) times daily as needed. FOR SBP GREATER THAN 160. IF BP REMAINS ELEVATED CONSISTENTLY, CONSIDER SCHEDULED DOSING. 01/20/14   Bonnielee Haff, MD  clopidogrel (PLAVIX) 75 MG tablet Take 1 tablet (75 mg total) by mouth daily with breakfast. PLEASE RESUME ON 01/28/14 01/20/14   Bonnielee Haff, MD  docusate sodium (COLACE) 100 MG capsule Take 100 mg by mouth 2 (two) times daily.    Historical Provider, MD  feeding  supplement, ENSURE COMPLETE, (ENSURE COMPLETE) LIQD Take 237 mLs by mouth 2 (two) times daily between meals. 01/20/14   Bonnielee Haff, MD  ferrous sulfate 325 (65 FE) MG tablet Take 325 mg by mouth 2 (two) times daily with a meal.    Historical Provider, MD  isosorbide-hydrALAZINE (BIDIL) 20-37.5 MG per tablet Take 1 tablet by mouth 3 (three) times daily. 01/20/14   Bonnielee Haff, MD  lactulose (CHRONULAC) 10 GM/15ML solution Take 30 mLs by mouth 2 (two) times daily.    Historical Provider, MD  memantine (NAMENDA) 10 MG tablet Take 10 mg by mouth 2 (two) times daily.    Historical Provider, MD  metoCLOPramide (REGLAN) 10 MG tablet Take 1 tablet (10 mg total) by mouth 4 (four) times daily -  before meals and at bedtime. 10/25/13   Cherene Altes, MD  metoprolol succinate (TOPROL-XL) 200 MG 24 hr tablet Take 1 tablet (200 mg total) by mouth daily. Take with or immediately following a meal. 11/20/12   Nita Sells, MD  pantoprazole (PROTONIX) 40 MG tablet Take 1 tablet (40 mg total) by mouth 2 (two) times daily before a meal. 10/25/13   Cherene Altes, MD  polyethylene glycol [facility] Floria Raveling) packet Take 17 g by mouth daily. 12/04/12   Belkys A Regalado, MD  Polyvinyl Alcohol-Povidone 2.7-2 % SOLN Apply 1 drop to eye 4 (four) times daily.    Historical Provider, MD  tamsulosin (FLOMAX) 0.4 MG CAPS Take 0.4 mg by mouth daily.    Historical Provider, MD  venlafaxine XR (EFFEXOR-XR) 150 MG 24 hr capsule Take 150 mg by mouth daily with breakfast.    Historical Provider, MD   BP 118/57 mmHg  Pulse 72  Temp(Src) 98.5 F (36.9 C) (Rectal)  Resp 20  SpO2 94% Physical Exam  Constitutional: He is oriented to person, place, and time. He appears well-developed. No distress.  HENT:  Head: Normocephalic and atraumatic.  Dry mucous membranes  Eyes: Right eye exhibits no discharge. Left eye exhibits no discharge.  Neck: Normal range of motion. Neck supple. No tracheal deviation present.   Cardiovascular: Normal rate and regular rhythm.   Pulmonary/Chest: Breath sounds normal. Respiratory distress: decreased effort, difficult exam due to hemiparesis.  Abdominal: Soft. He exhibits no distension. There is no tenderness. There is no guarding.  Musculoskeletal: He exhibits edema ( mild lower extremities bilateral).  Neurological: He is alert and oriented to person, place, and time. GCS eye subscore is 4. GCS verbal subscore is 4. GCS motor subscore is 6.  Right hemiparesis, neck supple, pupils equal bilateral  Skin: Skin is warm. No rash noted.  Psychiatric: He has a normal mood and affect.  Nursing note and vitals reviewed.   ED Course  Procedures (including critical care time) CRITICAL CARE Performed by: Mariea Clonts   Total critical care time: 35 min  Critical care time was exclusive of separately billable procedures and treating other patients.  Critical care  was necessary to treat or prevent imminent or life-threatening deterioration.  Critical care was time spent personally by me on the following activities: development of treatment plan with patient and/or surrogate as well as nursing, discussions with consultants, evaluation of patient's response to treatment, examination of patient, obtaining history from patient or surrogate, ordering and performing treatments and interventions, ordering and review of laboratory studies, ordering and review of radiographic studies, pulse oximetry and re-evaluation of patient's condition.  Labs Review Labs Reviewed  CBC WITH DIFFERENTIAL/PLATELET - Abnormal; Notable for the following:    Hemoglobin 12.0 (*)    HCT 35.5 (*)    RDW 16.6 (*)    Neutrophils Relative % 83 (*)    Lymphocytes Relative 9 (*)    All other components within normal limits  COMPREHENSIVE METABOLIC PANEL - Abnormal; Notable for the following:    Potassium 6.0 (*)    Chloride 114 (*)    CO2 12 (*)    Glucose, Bld 204 (*)    BUN 182 (*)     Creatinine, Ser 9.10 (*)    Calcium 8.8 (*)    Total Protein 6.1 (*)    Albumin 2.3 (*)    AST 130 (*)    ALT 193 (*)    Total Bilirubin 2.0 (*)    GFR calc non Af Amer 5 (*)    GFR calc Af Amer 6 (*)    All other components within normal limits  URINALYSIS, ROUTINE W REFLEX MICROSCOPIC - Abnormal; Notable for the following:    Color, Urine AMBER (*)    APPearance TURBID (*)    Hgb urine dipstick LARGE (*)    Protein, ur 100 (*)    Leukocytes, UA LARGE (*)    All other components within normal limits  URINE MICROSCOPIC-ADD ON - Abnormal; Notable for the following:    Bacteria, UA MANY (*)    All other components within normal limits  MAGNESIUM - Abnormal; Notable for the following:    Magnesium 1.5 (*)    All other components within normal limits  BLOOD GAS, VENOUS - Abnormal; Notable for the following:    pH, Ven 7.224 (*)    pCO2, Ven 35.6 (*)    Bicarbonate 14.2 (*)    Acid-base deficit 12.4 (*)    All other components within normal limits  CBG MONITORING, ED - Abnormal; Notable for the following:    Glucose-Capillary 50 (*)    All other components within normal limits  CBG MONITORING, ED - Abnormal; Notable for the following:    Glucose-Capillary 119 (*)    All other components within normal limits  PHOSPHORUS  BASIC METABOLIC PANEL  BASIC METABOLIC PANEL  CBC WITH DIFFERENTIAL/PLATELET  HEPATIC FUNCTION PANEL  CBC  CBC  CREATININE, SERUM  AMMONIA  TSH  I-STAT CG4 LACTIC ACID, ED  CBG MONITORING, ED  I-STAT CG4 LACTIC ACID, ED  CBG MONITORING, ED    Imaging Review Ct Abdomen Pelvis Wo Contrast  01/24/2015   CLINICAL DATA:  Acute renal failure. Abnormal labs. Urinary tract infection.  EXAM: CT ABDOMEN AND PELVIS WITHOUT CONTRAST  TECHNIQUE: Multidetector CT imaging of the abdomen and pelvis was performed following the standard protocol without IV contrast.  COMPARISON:  11/24/2012  FINDINGS: Small bilateral pleural effusions with basilar atelectasis. Small  pericardial effusion. Esophageal hiatal hernia.  The gallbladder is distended without inflammatory infiltration. No radiopaque stones identified. No bile duct dilatation. Calcified granulomas in the spleen. The unenhanced appearance of the liver, pancreas,  adrenal glands, inferior vena cava, and retroperitoneal lymph nodes is unremarkable. Calcification of abdominal aorta without aneurysm. Multiple exophytic lesions in the kidneys most likely representing cysts. Small hemorrhagic cyst in the left kidney. No significant changes since previous study. There is hydronephrosis and hydroureter on the left. No obstructing stone is demonstrated. This could represent a non radiopaque stone or reflux. Stomach, small bowel, and colon are not abnormally distended. Diffusely stool-filled colon. Small umbilical hernia containing a portion of small bowel. No proximal obstruction.  Pelvis: Appendix is normal. Prostate gland is not enlarged. There is a Foley catheter in the bladder. Gas in the bladder is likely due to catheter insertion. The bladder is decompressed but the bladder wall appears to be diffusely thickened, suggesting cystitis. Rectal wall is also thickened suggesting proctitis. No free or loculated pelvic fluid collections. No pelvic mass or lymphadenopathy. Scoliosis and degenerative changes in the spine.  IMPRESSION: Diffuse wall thickening in the bladder suggest probable cystitis. Left hydronephrosis and hydroureter. No obstructing stones demonstrated. This could indicate a non radiopaque stone versus reflux disease. Probable bilateral renal cysts. Diffuse thickening of the rectal wall suggesting proctitis. Small umbilical hernia containing small bowel but without obstruction.   Electronically Signed   By: Lucienne Capers M.D.   On: 01/24/2015 22:50   Dg Chest Port 1 View  01/24/2015   CLINICAL DATA:  Unwitnessed fall this morning.  EXAM: PORTABLE CHEST - 1 VIEW  COMPARISON:  01/08/2014  FINDINGS: There is  moderate unchanged cardiomegaly. There is no confluent airspace consolidation. There is no large effusion. Hilar and mediastinal contours are unremarkable and unchanged.  IMPRESSION: Cardiomegaly.  No acute cardiopulmonary findings.   Electronically Signed   By: Andreas Newport M.D.   On: 01/24/2015 20:26     EKG Interpretation   Date/Time:  Tuesday Jan 24 2015 20:31:00 EDT Ventricular Rate:  77 PR Interval:  194 QRS Duration: 201 QT Interval:  501 QTC Calculation: 567 R Axis:   132 Text Interpretation:  Sinus rhythm Nonspecific intraventricular conduction  delay wide QRS, overall similar morphology to Ap 2015 Confirmed by Cheney Ewart   MD, Summers Buendia ([id number]) on 01/24/2015 8:33:57 PM      MDM   Final diagnoses:  Altered mental state  Metabolic acidosis  Acute renal failure, unspecified acute renal failure type  Uremia  Acute hyperkalemia  Anemia, unspecified anemia type  Abnormal EKG  LFT elevation  Bilirubinemia   Patient with the nursing home presents with abnormal labs and possible urine infection. Plan for blood work, IV fluid bolus, urinalysis culture and chest x-ray portable.  Blood work showed significant abnormality. Ambidextrous ordered, small insulin bolus, IV fluids, bicarbonate for hyperkalemia. EKG showed widening QRS. IV antibiotics given for urine infection. CT scan ordered to look for gallbladder pathology or kidney stones with significant lab abnormalities. QRS wide on EKG.  Discussed with nephrology on call who agrees with transfer to stepdown  and will see the patient there, patient likely will need dialysis. Discussed with [facility] hospitalist will help arrange transfer.  The patients results and plan were reviewed and discussed.   Any x-rays performed were personally reviewed by myself.   Differential diagnosis were considered with the presenting HPI.  Medications  dextrose 5 % 1,000 mL with sodium bicarbonate 150 mEq infusion ( Intravenous New Bag/Given  01/24/15 2309)  venlafaxine XR (EFFEXOR-XR) 24 hr capsule 150 mg (not administered)  clopidogrel (PLAVIX) tablet 75 mg (not administered)  aspirin chewable tablet 81 mg (not administered)  feeding  supplement (ENSURE COMPLETE) (ENSURE COMPLETE) liquid 237 mL (not administered)  isosorbide-hydrALAZINE (BIDIL) 20-37.5 MG per tablet 1 tablet (not administered)  ferrous sulfate tablet 325 mg (not administered)  memantine (NAMENDA) tablet 10 mg (not administered)  metoCLOPramide (REGLAN) tablet 10 mg (not administered)  pantoprazole (PROTONIX) EC tablet 40 mg (not administered)  docusate sodium (COLACE) capsule 100 mg (not administered)  metoprolol succinate (TOPROL-XL) 24 hr tablet 200 mg (not administered)  tamsulosin (FLOMAX) capsule 0.4 mg (not administered)  hydrALAZINE (APRESOLINE) injection 10 mg (not administered)  acetaminophen (TYLENOL) tablet 650 mg (not administered)    Or  acetaminophen (TYLENOL) suppository 650 mg (not administered)  ondansetron (ZOFRAN) tablet 4 mg (not administered)    Or  ondansetron (ZOFRAN) injection 4 mg (not administered)  enoxaparin (LOVENOX) injection 30 mg (not administered)  sodium chloride 0.9 % injection 3 mL (not administered)  cefTRIAXone (ROCEPHIN) 1 g in dextrose 5 % 50 mL IVPB (not administered)  dextrose 50 % solution 25 g (50 mLs Intravenous Given 01/24/15 1840)  sodium chloride 0.9 % bolus 1,000 mL (0 mLs Intravenous Stopped 01/24/15 2022)  sodium chloride 0.9 % bolus 1,000 mL (0 mLs Intravenous Stopped 01/24/15 2145)  cefTRIAXone (ROCEPHIN) 1 g in dextrose 5 % 50 mL IVPB (0 g Intravenous Stopped 01/24/15 2122)  dextrose 50 % solution 50 mL (50 mLs Intravenous Given 01/24/15 2140)  insulin aspart (novoLOG) injection 4 Units (4 Units Intravenous Given 01/24/15 2143)  calcium gluconate 1 g in sodium chloride 0.9 % 100 mL IVPB (0 g Intravenous Stopped 01/24/15 2309)  sodium bicarbonate injection 50 mEq (50 mEq Intravenous Given 01/24/15 2133)     Filed Vitals:   01/24/15 2144 01/24/15 2200 01/24/15 2300 01/24/15 2330  BP: 123/80 121/53 116/53 118/57  Pulse: 78 76 74 72  Temp:      TempSrc:      Resp: 24 24 19 20   SpO2: 92% 94% 95% 94%    Final diagnoses:  Altered mental state  Metabolic acidosis  Acute renal failure, unspecified acute renal failure type  Uremia  Acute hyperkalemia  Anemia, unspecified anemia type  Abnormal EKG  LFT elevation  Bilirubinemia    Admission/ observation were discussed with the admitting physician, patient and/or family and they are comfortable with the plan.      Elnora Morrison, MD 01/25/15 [id number]

## 2015-01-25 ENCOUNTER — Encounter (HOSPITAL_COMMUNITY): Payer: Self-pay | Admitting: *Deleted

## 2015-01-25 DIAGNOSIS — F0391 Unspecified dementia with behavioral disturbance: Secondary | ICD-10-CM

## 2015-01-25 DIAGNOSIS — I5022 Chronic systolic (congestive) heart failure: Secondary | ICD-10-CM | POA: Diagnosis present

## 2015-01-25 DIAGNOSIS — E872 Acidosis: Secondary | ICD-10-CM

## 2015-01-25 LAB — CBC WITH DIFFERENTIAL/PLATELET
BASOS ABS: 0 10*3/uL (ref 0.0–0.1)
Basophils Relative: 0 % (ref 0–1)
Eosinophils Absolute: 0.1 10*3/uL (ref 0.0–0.7)
Eosinophils Relative: 2 % (ref 0–5)
HCT: 32.2 % — ABNORMAL LOW (ref 39.0–52.0)
Hemoglobin: 10.7 g/dL — ABNORMAL LOW (ref 13.0–17.0)
LYMPHS PCT: 7 % — AB (ref 12–46)
Lymphs Abs: 0.5 10*3/uL — ABNORMAL LOW (ref 0.7–4.0)
MCH: 26.4 pg (ref 26.0–34.0)
MCHC: 33.2 g/dL (ref 30.0–36.0)
MCV: 79.3 fL (ref 78.0–100.0)
Monocytes Absolute: 0.6 10*3/uL (ref 0.1–1.0)
Monocytes Relative: 8 % (ref 3–12)
NEUTROS PCT: 83 % — AB (ref 43–77)
Neutro Abs: 5.9 10*3/uL (ref 1.7–7.7)
Platelets: 193 10*3/uL (ref 150–400)
RBC: 4.06 MIL/uL — ABNORMAL LOW (ref 4.22–5.81)
RDW: 16.4 % — AB (ref 11.5–15.5)
WBC: 7.1 10*3/uL (ref 4.0–10.5)

## 2015-01-25 LAB — HEPATITIS PANEL, ACUTE
HCV AB: NEGATIVE
Hep A IgM: NONREACTIVE
Hep B C IgM: NONREACTIVE
Hepatitis B Surface Ag: NEGATIVE

## 2015-01-25 LAB — HEPATIC FUNCTION PANEL
ALBUMIN: 2 g/dL — AB (ref 3.5–5.0)
ALT: 149 U/L — ABNORMAL HIGH (ref 17–63)
AST: 98 U/L — AB (ref 15–41)
Alkaline Phosphatase: 100 U/L (ref 38–126)
Bilirubin, Direct: 0.8 mg/dL — ABNORMAL HIGH (ref 0.1–0.5)
Indirect Bilirubin: 1.1 mg/dL — ABNORMAL HIGH (ref 0.3–0.9)
TOTAL PROTEIN: 5.3 g/dL — AB (ref 6.5–8.1)
Total Bilirubin: 1.9 mg/dL — ABNORMAL HIGH (ref 0.3–1.2)

## 2015-01-25 LAB — CREATININE, SERUM
CREATININE: 8.15 mg/dL — AB (ref 0.61–1.24)
GFR calc non Af Amer: 6 mL/min — ABNORMAL LOW (ref >60)
GFR, EST AFRICAN AMERICAN: 7 mL/min — AB (ref >60)

## 2015-01-25 LAB — CBC
HEMATOCRIT: 31.9 % — AB (ref 39.0–52.0)
HEMOGLOBIN: 10.6 g/dL — AB (ref 13.0–17.0)
MCH: 26.3 pg (ref 26.0–34.0)
MCHC: 33.2 g/dL (ref 30.0–36.0)
MCV: 79.2 fL (ref 78.0–100.0)
Platelets: 181 10*3/uL (ref 150–400)
RBC: 4.03 MIL/uL — AB (ref 4.22–5.81)
RDW: 16.5 % — ABNORMAL HIGH (ref 11.5–15.5)
WBC: 9.2 10*3/uL (ref 4.0–10.5)

## 2015-01-25 LAB — AMMONIA: AMMONIA: 36 umol/L — AB (ref 9–35)

## 2015-01-25 LAB — MRSA PCR SCREENING: MRSA by PCR: POSITIVE — AB

## 2015-01-25 LAB — BASIC METABOLIC PANEL
Anion gap: 18 — ABNORMAL HIGH (ref 5–15)
BUN: 157 mg/dL — AB (ref 6–20)
CHLORIDE: 114 mmol/L — AB (ref 101–111)
CO2: 16 mmol/L — AB (ref 22–32)
CREATININE: 7.96 mg/dL — AB (ref 0.61–1.24)
Calcium: 8.4 mg/dL — ABNORMAL LOW (ref 8.9–10.3)
GFR calc Af Amer: 7 mL/min — ABNORMAL LOW (ref >60)
GFR calc non Af Amer: 6 mL/min — ABNORMAL LOW (ref >60)
GLUCOSE: 173 mg/dL — AB (ref 70–99)
POTASSIUM: 5 mmol/L (ref 3.5–5.1)
Sodium: 148 mmol/L — ABNORMAL HIGH (ref 135–145)

## 2015-01-25 LAB — TSH: TSH: 0.123 u[IU]/mL — ABNORMAL LOW (ref 0.350–4.500)

## 2015-01-25 MED ORDER — SODIUM CHLORIDE 0.9 % IV SOLN: INTRAVENOUS | Status: DC

## 2015-01-25 MED ORDER — MUPIROCIN 2 % EX OINT
1.0000 "application " | TOPICAL_OINTMENT | Freq: Two times a day (BID) | CUTANEOUS | Status: AC
  Administered 2015-01-25 – 2015-01-30 (×10): 1 via NASAL
  Filled 2015-01-25: qty 22

## 2015-01-25 MED ORDER — SODIUM CHLORIDE 0.9 % IV SOLN
Freq: Once | INTRAVENOUS | Status: AC
  Administered 2015-01-25: 09:00:00 via INTRAVENOUS

## 2015-01-25 MED ORDER — CHLORHEXIDINE GLUCONATE CLOTH 2 % EX PADS
6.0000 | MEDICATED_PAD | Freq: Every day | CUTANEOUS | Status: AC
  Administered 2015-01-26 – 2015-01-30 (×4): 6 via TOPICAL

## 2015-01-25 NOTE — ED Notes (Signed)
Pt unable to swallow PO medications safely at this time. MD [place] Pink notified and is aware.

## 2015-01-25 NOTE — ED Notes (Signed)
Phlebotomy called to draw Acute Hepatitis Panel.

## 2015-01-25 NOTE — Progress Notes (Signed)
PROGRESS NOTE  [name] UXN:[id number] DOB: [birth year] DOA: 01/24/2015 PCP: Merlene Laughter, MD  HPI/Recap of past 11 hours: 69 year old male with past history of stroke causing right-sided hemiplegia, dementia, cardiopathy with EF of 30%  and chronic kidney disease stage II with baseline creatinine around 1.4 brought in 5/10 for increasing confusion and patient found to be in acute renal failure with creatinine of 9.1 and BUN of 182. Other labs noted for normal white blood cell count, large urinary tract infection and metabolic acidosis. Patient started on bicarbonate drip, CT scan done noting some left-sided hydronephrosis, but no signs obstructing stone. By following morning, patient's renal function slowly improving. Patient self seen in ER prior to transfer and is mostly somnolent.  Potassium this morning elevated at 6  Assessment/Plan: Active Problems:   Dementia with acute behavioral disturbance from encephalopathy from uremia  Hyperkalemia: Secondary to renal failure, has stopped patient's supplemental potassium. Received one dose of calcium carbonate    Hemiparesis affecting right side as late effect of stroke: Stable  Acute kidney injury in the setting of stage II chronic kidney disease: Looks to be prerenal from severe dehydration. Aggressively hydrate with bicarbonate drip. Urology and nephrology following. At this time, dialysis was not indicated and patient may not be dialysis candidate regardless. Continue in stepdown.    Cardiomyopathy with chronic systolic heart failure: Per 2 D echo 10/22/13 EF 30-35% likely we will cause volume overload due to aggressive hydration. Monitoring respiratory function, which is currently normal    Acute encephalopathy: Secondary to uremia    Metabolic acidosis: Secondary to uremia   UTI (lower urinary tract infection): Primary problem, continue IV antibiotics   Code Status: Full code  Family Communication: Left message with  family  Disposition Plan: Continue step down   Consultants:  Urology  Nephrology  Procedures:  None  Antibiotics:  IV Rocephin 5/10-present  Objective: BP 142/53 mmHg  Pulse 79  Temp(Src) 98.6 F (37 C) (Oral)  Resp 20  Ht 5\' 9"  (1.753 m)  Wt 79.1 kg (174 lb 6.1 oz)  BMI 25.74 kg/m2  SpO2 100%  Intake/Output Summary (Last 24 hours) at 01/25/15 1508 Last data filed at 01/25/15 1500  Gross per 24 hour  Intake    650 ml  Output   2325 ml  Net  -1675 ml   Filed Weights   01/25/15 1255  Weight: 79.1 kg (174 lb 6.1 oz)    Exam:   General:  Unresponsive, somnolent  Cardiovascular: regular rate and rhythm, S1-S2  Respiratory: clear to auscultation bilaterally  Abdomen: soft, nondistended, hypoactive bowel sounds  Musculoskeletal: no clubbing or cyanosis, trace pitting edema   Data Reviewed: Basic Metabolic Panel:  Recent Labs Lab 01/24/15 1912 01/24/15 2350 01/25/15 0235  NA 141  --  148*  K 6.0*  --  5.0  CL 114*  --  114*  CO2 12*  --  16*  GLUCOSE 204*  --  173*  BUN 182*  --  157*  CREATININE 9.10* 8.15* 7.96*  CALCIUM 8.8*  --  8.4*  MG 1.5*  --   --   PHOS 2.7  --   --    Liver Function Tests:  Recent Labs Lab 01/24/15 1912 01/25/15 0235  AST 130* 98*  ALT 193* 149*  ALKPHOS 110 100  BILITOT 2.0* 1.9*  PROT 6.1* 5.3*  ALBUMIN 2.3* 2.0*   No results for input(s): LIPASE, AMYLASE in the last 168 hours.  Recent Labs Lab 01/24/15  2350  AMMONIA 36*   CBC:  Recent Labs Lab 01/24/15 1912 01/24/15 2350 01/25/15 0235  WBC 8.4 9.2 7.1  NEUTROABS 7.0  --  5.9  HGB 12.0* 10.6* 10.7*  HCT 35.5* 31.9* 32.2*  MCV 79.8 79.2 79.3  PLT 193 181 193   Cardiac Enzymes:   No results for input(s): CKTOTAL, CKMB, CKMBINDEX, TROPONINI in the last 168 hours. BNP (last 3 results) No results for input(s): BNP in the last 8760 hours.  ProBNP (last 3 results) No results for input(s): PROBNP in the last 8760 hours.  CBG:  Recent  Labs Lab 01/24/15 1824 01/24/15 2114 01/24/15 2257  GLUCAP 50* 91 119*    Recent Results (from the past 240 hour(s))  MRSA PCR Screening     Status: Abnormal   Collection Time: 01/25/15 12:51 PM  Result Value Ref Range Status   MRSA by PCR POSITIVE (A) NEGATIVE Final    Comment:        The GeneXpert MRSA Assay (FDA approved for NASAL specimens only), is one component of a comprehensive MRSA colonization surveillance program. It is not intended to diagnose MRSA infection nor to guide or monitor treatment for MRSA infections. RESULT CALLED TO, READ BACK BY AND VERIFIED WITH: ARNOLD,A @ 2836 ON [id number] BY POTEAT,S      Studies: Ct Abdomen Pelvis Wo Contrast  01/24/2015   CLINICAL DATA:  Acute renal failure. Abnormal labs. Urinary tract infection.  EXAM: CT ABDOMEN AND PELVIS WITHOUT CONTRAST  TECHNIQUE: Multidetector CT imaging of the abdomen and pelvis was performed following the standard protocol without IV contrast.  COMPARISON:  11/24/2012  FINDINGS: Small bilateral pleural effusions with basilar atelectasis. Small pericardial effusion. Esophageal hiatal hernia.  The gallbladder is distended without inflammatory infiltration. No radiopaque stones identified. No bile duct dilatation. Calcified granulomas in the spleen. The unenhanced appearance of the liver, pancreas, adrenal glands, inferior vena cava, and retroperitoneal lymph nodes is unremarkable. Calcification of abdominal aorta without aneurysm. Multiple exophytic lesions in the kidneys most likely representing cysts. Small hemorrhagic cyst in the left kidney. No significant changes since previous study. There is hydronephrosis and hydroureter on the left. No obstructing stone is demonstrated. This could represent a non radiopaque stone or reflux. Stomach, small bowel, and colon are not abnormally distended. Diffusely stool-filled colon. Small umbilical hernia containing a portion of small bowel. No proximal obstruction.  Pelvis:  Appendix is normal. Prostate gland is not enlarged. There is a Foley catheter in the bladder. Gas in the bladder is likely due to catheter insertion. The bladder is decompressed but the bladder wall appears to be diffusely thickened, suggesting cystitis. Rectal wall is also thickened suggesting proctitis. No free or loculated pelvic fluid collections. No pelvic mass or lymphadenopathy. Scoliosis and degenerative changes in the spine.  IMPRESSION: Diffuse wall thickening in the bladder suggest probable cystitis. Left hydronephrosis and hydroureter. No obstructing stones demonstrated. This could indicate a non radiopaque stone versus reflux disease. Probable bilateral renal cysts. Diffuse thickening of the rectal wall suggesting proctitis. Small umbilical hernia containing small bowel but without obstruction.   Electronically Signed   By: Lucienne Capers M.D.   On: 01/24/2015 22:50   Dg Chest Port 1 View  01/24/2015   CLINICAL DATA:  Unwitnessed fall this morning.  EXAM: PORTABLE CHEST - 1 VIEW  COMPARISON:  01/08/2014  FINDINGS: There is moderate unchanged cardiomegaly. There is no confluent airspace consolidation. There is no large effusion. Hilar and mediastinal contours are unremarkable and unchanged.  IMPRESSION:  Cardiomegaly.  No acute cardiopulmonary findings.   Electronically Signed   By: Andreas Newport M.D.   On: 01/24/2015 20:26    Scheduled Meds: . aspirin  81 mg Oral Daily  . cefTRIAXone (ROCEPHIN)  IV  1 g Intravenous Q24H  . clopidogrel  75 mg Oral Q breakfast  . docusate sodium  100 mg Oral BID  . enoxaparin (LOVENOX) injection  30 mg Subcutaneous Q24H  . feeding supplement (ENSURE ENLIVE)  237 mL Oral BID BM  . ferrous sulfate  325 mg Oral BID WC  . isosorbide-hydrALAZINE  1 tablet Oral TID  . memantine  10 mg Oral BID  . metoCLOPramide  10 mg Oral TID AC & HS  . metoprolol succinate  200 mg Oral Daily  . pantoprazole  40 mg Oral BID AC  . sodium chloride  3 mL Intravenous Q12H    . tamsulosin  0.4 mg Oral Daily  . venlafaxine XR  150 mg Oral Q breakfast    Continuous Infusions: . dextrose 5 % 1,000 mL with sodium bicarbonate 150 mEq infusion 150 mL/hr at 01/25/15 1030     Time spent: 40 min  [facility] Pager [phone]. If 7PM-7AM, please contact night-coverage at [email or web address], password [facility] 01/25/2015, 3:08 PM  LOS: 1 day

## 2015-01-25 NOTE — ED Notes (Signed)
Lab called to draw BMP

## 2015-01-25 NOTE — Clinical Social Work Note (Addendum)
Clinical Social Work Assessment  Patient Details  Name: JAIMEN MELONE MRN: [id number] Date of Birth: [birth year]  Date of referral:  01/25/15               Reason for consult:  Facility Placement                Permission sought to share information with:  Facility Sport and exercise psychologist, Family Supports Permission granted to share information::  Yes, Verbal Permission Granted  Name::     [employer]  Agency::  [facility]  Relationship::  Brother  Contact Information:   [phone]  Housing/Transportation Living arrangements for the past 2 months:  [place] ([place] 8-9 years) Source of Information:  Patient, Other (Comment Required) (chart review, left message for brother) Patient Interpreter Needed:  None Criminal Activity/Legal Involvement Pertinent to Current Situation/Hospitalization:    Significant Relationships:  Other Family Members, Siblings Lives with:  Facility Resident Do you feel safe going back to the place where you live?  Yes Need for family participation in patient care:  Yes (Comment)  Care giving concerns:  Concerns regarding patient recent hospital admission 4/25 to 5/7 and return admission.   Social Worker assessment / plan:  CSW emt with pt at bedside to complete psychosocial assessment. Pt has history of dementia however alert to self and family members. Unable to assess if patient is oriented to situation of place. Pt responds with Yes and I dont know. Pt gave permission for csw to talk with brother, Akshar Starnes. CSW attempted to reach patient brother at [phone] and unable to leave a message. Per chart review, pt has been a resident at [facility] for the past 8 or 9 years. Patient family has been happy with patient care at [facility] during previous admission. Unit CSW to continue to try to reach pt brother to confirm pt disposition and assist with discharge planning. Patient was very pleasant during assessment, and thanked csw for  turning on the tv. Patient was not able to express what he liked to watch but stated [facility], when today show was presented. CSW will complete fl2 for MD signature.   Employment status:  Other (Comment), Disabled (Comment on whether or not currently receiving Disability) Insurance information:  Medicare, Medicaid In [place] PT Recommendations:  Not assessed at this time Information / Referral to community resources:   none identified at this time  Patient/Family's Response to care:  When asked about returning to [facility] patient responded yes. CSW will touch base with pt family to ensure disposition plan due to patient dementia. Pt family unavailable at this time.   Patient/Family's Understanding of and Emotional Response to Diagnosis, Current Treatment, and Prognosis:  Unable to assess patient udnerstanding of current treatment and diagnosis. Pt family unavailable at this time.   Emotional Assessment Appearance:  Appears stated age, Developmentally appropriate Attitude/Demeanor/Rapport:   (calm and cooperative, resting ) Affect (typically observed):  Flat, Unable to Assess Orientation:  Oriented to Self, Oriented to Place Alcohol / Substance use:    Psych involvement (Current and /or in the community):  No (Comment)  Discharge Needs  Concerns to be addressed:  Discharge Planning Concerns Readmission within the last 30 days:  Yes Current discharge risk:  None Barriers to Discharge:  No Barriers Identified   Drako Maese, [place], LCSW 01/25/2015, 9:05 AM

## 2015-01-25 NOTE — Progress Notes (Signed)
Patient is lethargic, and not able to stay awake and swallow appropriately.  Patient has been ordered several po meds, and Dr. [place] Pink paged twice to get po medications changed to IV.  No return of either page at this time.  Continuing to hold po meds for now until can get the physcian to change medications to IV route.  Continue to monitor patient closely.  Charnika Herbst Roselie Awkward RN

## 2015-01-25 NOTE — Progress Notes (Signed)
S: Non verbal O:BP 125/44 mmHg  Pulse 81  Temp(Src) 98.5 F (36.9 C) (Rectal)  Resp 23  SpO2 97%  Intake/Output Summary (Last 24 hours) at 01/25/15 1249 Last data filed at 01/25/15 1212  Gross per 24 hour  Intake    200 ml  Output   2200 ml  Net  -2000 ml   Weight change:  AVW:UJWJX and alert CVS:RRR Resp: Clear Abd: + BS NTND Ext: no edema NEURO: Decreased movement of all extremities, appears Rt>Lt.  He does seem to follow an occasional simple command GU: Foley in place   . aspirin  81 mg Oral Daily  . cefTRIAXone (ROCEPHIN)  IV  1 g Intravenous Q24H  . clopidogrel  75 mg Oral Q breakfast  . docusate sodium  100 mg Oral BID  . enoxaparin (LOVENOX) injection  30 mg Subcutaneous Q24H  . feeding supplement (ENSURE ENLIVE)  237 mL Oral BID BM  . ferrous sulfate  325 mg Oral BID WC  . isosorbide-hydrALAZINE  1 tablet Oral TID  . memantine  10 mg Oral BID  . metoCLOPramide  10 mg Oral TID AC & HS  . metoprolol succinate  200 mg Oral Daily  . pantoprazole  40 mg Oral BID AC  . sodium chloride  3 mL Intravenous Q12H  . tamsulosin  0.4 mg Oral Daily  . venlafaxine XR  150 mg Oral Q breakfast   Ct Abdomen Pelvis Wo Contrast  01/24/2015   CLINICAL DATA:  Acute renal failure. Abnormal labs. Urinary tract infection.  EXAM: CT ABDOMEN AND PELVIS WITHOUT CONTRAST  TECHNIQUE: Multidetector CT imaging of the abdomen and pelvis was performed following the standard protocol without IV contrast.  COMPARISON:  11/24/2012  FINDINGS: Small bilateral pleural effusions with basilar atelectasis. Small pericardial effusion. Esophageal hiatal hernia.  The gallbladder is distended without inflammatory infiltration. No radiopaque stones identified. No bile duct dilatation. Calcified granulomas in the spleen. The unenhanced appearance of the liver, pancreas, adrenal glands, inferior vena cava, and retroperitoneal lymph nodes is unremarkable. Calcification of abdominal aorta without aneurysm. Multiple  exophytic lesions in the kidneys most likely representing cysts. Small hemorrhagic cyst in the left kidney. No significant changes since previous study. There is hydronephrosis and hydroureter on the left. No obstructing stone is demonstrated. This could represent a non radiopaque stone or reflux. Stomach, small bowel, and colon are not abnormally distended. Diffusely stool-filled colon. Small umbilical hernia containing a portion of small bowel. No proximal obstruction.  Pelvis: Appendix is normal. Prostate gland is not enlarged. There is a Foley catheter in the bladder. Gas in the bladder is likely due to catheter insertion. The bladder is decompressed but the bladder wall appears to be diffusely thickened, suggesting cystitis. Rectal wall is also thickened suggesting proctitis. No free or loculated pelvic fluid collections. No pelvic mass or lymphadenopathy. Scoliosis and degenerative changes in the spine.  IMPRESSION: Diffuse wall thickening in the bladder suggest probable cystitis. Left hydronephrosis and hydroureter. No obstructing stones demonstrated. This could indicate a non radiopaque stone versus reflux disease. Probable bilateral renal cysts. Diffuse thickening of the rectal wall suggesting proctitis. Small umbilical hernia containing small bowel but without obstruction.   Electronically Signed   By: Lucienne Capers M.D.   On: 01/24/2015 22:50   Dg Chest Port 1 View  01/24/2015   CLINICAL DATA:  Unwitnessed fall this morning.  EXAM: PORTABLE CHEST - 1 VIEW  COMPARISON:  01/08/2014  FINDINGS: There is moderate unchanged cardiomegaly. There is no confluent  airspace consolidation. There is no large effusion. Hilar and mediastinal contours are unremarkable and unchanged.  IMPRESSION: Cardiomegaly.  No acute cardiopulmonary findings.   Electronically Signed   By: Andreas Newport M.D.   On: 01/24/2015 20:26   BMET    Component Value Date/Time   NA 148* 01/25/2015 0235   K 5.0 01/25/2015 0235   CL  114* 01/25/2015 0235   CO2 16* 01/25/2015 0235   GLUCOSE 173* 01/25/2015 0235   BUN 157* 01/25/2015 0235   CREATININE 7.96* 01/25/2015 0235   CALCIUM 8.4* 01/25/2015 0235   GFRNONAA 6* 01/25/2015 0235   GFRAA 7* 01/25/2015 0235   CBC    Component Value Date/Time   WBC 7.1 01/25/2015 0235   RBC 4.06* 01/25/2015 0235   HGB 10.7* 01/25/2015 0235   HCT 32.2* 01/25/2015 0235   PLT 193 01/25/2015 0235   MCV 79.3 01/25/2015 0235   MCH 26.4 01/25/2015 0235   MCHC 33.2 01/25/2015 0235   RDW 16.4* 01/25/2015 0235   LYMPHSABS 0.5* 01/25/2015 0235   MONOABS 0.6 01/25/2015 0235   EOSABS 0.1 01/25/2015 0235   BASOSABS 0.0 01/25/2015 0235     Assessment: 1. Acute on CKD 3 secondary to what appears to be atrophic/ hydronephrotic Lt kidney, UTI, hypotension +/- volume depletion.  Scr/BUN trending down and UO improving 2. Hyperkalemia, improved 3. Met acidosis , on bicarb gtt   Plan: 1. Cont hydration with isotonic bicarb 2. Urine culture 3. Empiric AB 4. Recheck labs in AM   Quron Ruddy T

## 2015-01-25 NOTE — Consult Note (Signed)
Reason for Consult: Acute on Chronic Renal Failure, Atrophic Left Kidney with Hydronephrosis, Urinary Tract Infection, Traumatic Hypospadias, Mobility Incontinence  Referring Physician: Gean Birchwood MD  [name] is an 69 y.o. male.   HPI:   1 - Acute on Chronic Renal Failure - Baseline CKD 3 with Cr 1.5's. Known atrophic left kidney x years. Cr 9's on ER labs 5/11 on eval failure to thrive and mental status changes. Per nursing home pt with very poor PO intake for few weeks. He does has some moderate left hydro of atrophic kidney. K 6 initially now 5 with medical treatment. Nephrology following as well.   2 - Atrophic Left Kidney with Hydronephrosis - severe left atrophic kidney by imaging studies for at least 5 years. New moderate left hydro to bladder by ER CT 01/25/15. No frank stones / masses. His foley balloon is in apposition to UO on imaging.   3 -  Urinary Tract Infection - long h/o recurrent bacteruria as has chronic foley. Most recnet UCX's klebsiella. Now with purlulent urine and mental status changes / failure to thrive. NO fevers / tachycardia / luekocytosis. UCX from ER pending. UA with wig bacteruria / pyuria. ON empiric Rocephin.   4 -  Traumatic Hypospadias - traumatic hypospadias to mid-shaft from chronic foley.   5 -  Mobility Incontinence- manages with chronic foley at nursign home due to inability to void into any sort of appliance and condom caths not fitting. Had UDS around 2012 with preserved bladder function but some outlet obstruction.   6 - Bilateral Renal Cysts - bilateral <2cm non-complex cysts by CT and [place] x several. Approximately stable by Ct this admission.  PMH sig for cardiomyopathy, prior CVA with resultant aphasia / hemiparesis / lives in nursing home. His POA is his brother [name] at [phone] and niece [name] at [phone].  Today [name] is seen in consultation for above, specifically to evaluate for role of left renal drainage.   Past  Medical History  Diagnosis Date  . Dementia   . Hypertension   . Hyperlipemia   . GERD (gastroesophageal reflux disease)   . BPH (benign prostatic hyperplasia)   . Cardiomyopathy: Per 2 D echo 10/22/13 EF 30-35% 01/08/2014  . Hydronephrosis   . Stroke   . Hemiparesis     right hemiparesis  . Dysphasia   . Aphasia     expressive aphasia  . Hydronephrosis   . History of hiatal hernia   . Esophagitis   . Upper GI bleed   . Dysrhythmia     hx nonsustained vt  . Anemia   . CHF (congestive heart failure)   . Chronic indwelling Foley catheter   . Diabetes mellitus without complication     Past Surgical History  Procedure Laterality Date  . Esophagogastroduodenoscopy N/A 11/11/2012    Procedure: ESOPHAGOGASTRODUODENOSCOPY (EGD);  Surgeon: Lear Ng, MD;  Location[facility] ENDOSCOPY;  Service: Endoscopy;  Laterality: N/A;  . Subxyphoid pericardial window N/A 11/13/2012    Procedure: SUBXYPHOID PERICARDIAL WINDOW;  Surgeon: Grace Isaac, MD;  Location: [place];  Service: Thoracic;  Laterality: N/A;  . Esophagogastroduodenoscopy N/A 11/25/2012    Procedure: ESOPHAGOGASTRODUODENOSCOPY (EGD);  Surgeon: Winfield Cunas., MD;  Location: [facility] ENDOSCOPY;  Service: Endoscopy;  Laterality: N/A;  . Esophagogastroduodenoscopy N/A 01/13/2014    Procedure: ESOPHAGOGASTRODUODENOSCOPY (EGD);  Surgeon: Wonda Horner, MD;  Location: [facility] ENDOSCOPY;  Service: Endoscopy;  Laterality: N/A;  . Peg placement    . Aicd implantation  Family History  Problem Relation Age of Onset  . CAD Father   . Hypertension Brother     Social History:  reports that he has never smoked. He has never used smokeless tobacco. He reports that he does not drink alcohol or use illicit drugs.  Allergies: No Known Allergies  Medications: I have reviewed the patient's current medications.  Results for orders placed or performed during the hospital encounter of 01/24/15 (from the past 48 hour(s))  CBG monitoring, ED      Status: Abnormal   Collection Time: 01/24/15  6:24 PM  Result Value Ref Range   Glucose-Capillary 50 (L) 70 - 99 mg/dL  Urinalysis, Routine w reflex microscopic     Status: Abnormal   Collection Time: 01/24/15  7:10 PM  Result Value Ref Range   Color, Urine AMBER (A) YELLOW    Comment: BIOCHEMICALS MAY BE AFFECTED BY COLOR   APPearance TURBID (A) CLEAR   Specific Gravity, Urine 1.013 1.005 - 1.030   pH 6.5 5.0 - 8.0   Glucose, UA NEGATIVE NEGATIVE mg/dL   Hgb urine dipstick LARGE (A) NEGATIVE   Bilirubin Urine NEGATIVE NEGATIVE   Ketones, ur NEGATIVE NEGATIVE mg/dL   Protein, ur 100 (A) NEGATIVE mg/dL   Urobilinogen, UA 0.2 0.0 - 1.0 mg/dL   Nitrite NEGATIVE NEGATIVE   Leukocytes, UA LARGE (A) NEGATIVE  Urine microscopic-add on     Status: Abnormal   Collection Time: 01/24/15  7:10 PM  Result Value Ref Range   WBC, UA TOO NUMEROUS TO COUNT <3 WBC/hpf   RBC / HPF TOO NUMEROUS TO COUNT <3 RBC/hpf   Bacteria, UA MANY (A) RARE  CBC with Differential     Status: Abnormal   Collection Time: 01/24/15  7:12 PM  Result Value Ref Range   WBC 8.4 4.0 - 10.5 K/uL   RBC 4.45 4.22 - 5.81 MIL/uL   Hemoglobin 12.0 (L) 13.0 - 17.0 g/dL   HCT 35.5 (L) 39.0 - 52.0 %   MCV 79.8 78.0 - 100.0 fL   MCH 27.0 26.0 - 34.0 pg   MCHC 33.8 30.0 - 36.0 g/dL   RDW 16.6 (H) 11.5 - 15.5 %   Platelets 193 150 - 400 K/uL   Neutrophils Relative % 83 (H) 43 - 77 %   Neutro Abs 7.0 1.7 - 7.7 K/uL   Lymphocytes Relative 9 (L) 12 - 46 %   Lymphs Abs 0.8 0.7 - 4.0 K/uL   Monocytes Relative 6 3 - 12 %   Monocytes Absolute 0.5 0.1 - 1.0 K/uL   Eosinophils Relative 2 0 - 5 %   Eosinophils Absolute 0.2 0.0 - 0.7 K/uL   Basophils Relative 0 0 - 1 %   Basophils Absolute 0.0 0.0 - 0.1 K/uL  Comprehensive metabolic panel     Status: Abnormal   Collection Time: 01/24/15  7:12 PM  Result Value Ref Range   Sodium 141 135 - 145 mmol/L   Potassium 6.0 (H) 3.5 - 5.1 mmol/L   Chloride 114 (H) 101 - 111 mmol/L   CO2  12 (L) 22 - 32 mmol/L   Glucose, Bld 204 (H) 70 - 99 mg/dL   BUN 182 (H) 6 - 20 mg/dL    Comment: RESULTS CONFIRMED BY MANUAL DILUTION   Creatinine, Ser 9.10 (H) 0.61 - 1.24 mg/dL   Calcium 8.8 (L) 8.9 - 10.3 mg/dL   Total Protein 6.1 (L) 6.5 - 8.1 g/dL   Albumin 2.3 (L) 3.5 - 5.0  g/dL   AST 130 (H) 15 - 41 U/L   ALT 193 (H) 17 - 63 U/L   Alkaline Phosphatase 110 38 - 126 U/L   Total Bilirubin 2.0 (H) 0.3 - 1.2 mg/dL   GFR calc non Af Amer 5 (L) >60 mL/min   GFR calc Af Amer 6 (L) >60 mL/min    Comment: (NOTE) The eGFR has been calculated using the CKD EPI equation. This calculation has not been validated in all clinical situations. eGFR's persistently <60 mL/min signify possible Chronic Kidney Disease.    Anion gap 15 5 - 15  Magnesium     Status: Abnormal   Collection Time: 01/24/15  7:12 PM  Result Value Ref Range   Magnesium 1.5 (L) 1.7 - 2.4 mg/dL  Phosphorus     Status: None   Collection Time: 01/24/15  7:12 PM  Result Value Ref Range   Phosphorus 2.7 2.5 - 4.6 mg/dL  I-Stat CG4 Lactic Acid, ED     Status: None   Collection Time: 01/24/15  7:19 PM  Result Value Ref Range   Lactic Acid, Venous 1.12 0.5 - 2.0 mmol/L  POC CBG, ED     Status: None   Collection Time: 01/24/15  9:14 PM  Result Value Ref Range   Glucose-Capillary 91 70 - 99 mg/dL  Blood gas, venous     Status: Abnormal   Collection Time: 01/24/15  9:25 PM  Result Value Ref Range   FIO2 0.21 %   Delivery systems ROOM AIR    pH, Ven 7.224 (L) 7.250 - 7.300   pCO2, Ven 35.6 (L) 45.0 - 50.0 mmHg   pO2, Ven VALUE BELOW REPORTABLE RANGE 30.0 - 45.0 mmHg    Comment: CRITICAL RESULT CALLED TO, READ BACK BY AND VERIFIED WITH: Lavera Guise, RN AT 2135 BY ANNALISSA AGUSTIN,RRT,RCP ON 01/24/15    Bicarbonate 14.2 (L) 20.0 - 24.0 mEq/L   TCO2 13.5 0 - 100 mmol/L   Acid-base deficit 12.4 (H) 0.0 - 2.0 mmol/L   O2 Saturation 39.9 %   Patient temperature 98.5    Collection site VEIN    Drawn by COLLECTED BY  LABORATORY    Sample type VEIN   I-Stat CG4 Lactic Acid, ED     Status: None   Collection Time: 01/24/15  9:36 PM  Result Value Ref Range   Lactic Acid, Venous 0.81 0.5 - 2.0 mmol/L  POC CBG, ED     Status: Abnormal   Collection Time: 01/24/15 10:57 PM  Result Value Ref Range   Glucose-Capillary 119 (H) 70 - 99 mg/dL  CBC     Status: Abnormal   Collection Time: 01/24/15 11:50 PM  Result Value Ref Range   WBC 9.2 4.0 - 10.5 K/uL    Comment: WHITE COUNT CONFIRMED ON SMEAR   RBC 4.03 (L) 4.22 - 5.81 MIL/uL   Hemoglobin 10.6 (L) 13.0 - 17.0 g/dL   HCT 31.9 (L) 39.0 - 52.0 %   MCV 79.2 78.0 - 100.0 fL   MCH 26.3 26.0 - 34.0 pg   MCHC 33.2 30.0 - 36.0 g/dL   RDW 16.5 (H) 11.5 - 15.5 %   Platelets 181 150 - 400 K/uL  Creatinine, serum     Status: Abnormal   Collection Time: 01/24/15 11:50 PM  Result Value Ref Range   Creatinine, Ser 8.15 (H) 0.61 - 1.24 mg/dL   GFR calc non Af Amer 6 (L) >60 mL/min   GFR calc Af Amer 7 (L) >60  mL/min    Comment: (NOTE) The eGFR has been calculated using the CKD EPI equation. This calculation has not been validated in all clinical situations. eGFR's persistently <60 mL/min signify possible Chronic Kidney Disease.   Ammonia     Status: Abnormal   Collection Time: 01/24/15 11:50 PM  Result Value Ref Range   Ammonia 36 (H) 9 - 35 umol/L  TSH     Status: Abnormal   Collection Time: 01/24/15 11:50 PM  Result Value Ref Range   TSH 0.123 (L) 0.350 - 4.500 uIU/mL  Basic metabolic panel     Status: Abnormal   Collection Time: 01/25/15  2:35 AM  Result Value Ref Range   Sodium 148 (H) 135 - 145 mmol/L   Potassium 5.0 3.5 - 5.1 mmol/L   Chloride 114 (H) 101 - 111 mmol/L   CO2 16 (L) 22 - 32 mmol/L   Glucose, Bld 173 (H) 70 - 99 mg/dL   BUN 157 (H) 6 - 20 mg/dL    Comment: RESULTS CONFIRMED BY MANUAL DILUTION   Creatinine, Ser 7.96 (H) 0.61 - 1.24 mg/dL   Calcium 8.4 (L) 8.9 - 10.3 mg/dL   GFR calc non Af Amer 6 (L) >60 mL/min   GFR calc Af Amer 7  (L) >60 mL/min    Comment: (NOTE) The eGFR has been calculated using the CKD EPI equation. This calculation has not been validated in all clinical situations. eGFR's persistently <60 mL/min signify possible Chronic Kidney Disease.    Anion gap 18 (H) 5 - 15  CBC with Differential/Platelet     Status: Abnormal   Collection Time: 01/25/15  2:35 AM  Result Value Ref Range   WBC 7.1 4.0 - 10.5 K/uL    Comment: WHITE COUNT CONFIRMED ON SMEAR   RBC 4.06 (L) 4.22 - 5.81 MIL/uL   Hemoglobin 10.7 (L) 13.0 - 17.0 g/dL   HCT 32.2 (L) 39.0 - 52.0 %   MCV 79.3 78.0 - 100.0 fL   MCH 26.4 26.0 - 34.0 pg   MCHC 33.2 30.0 - 36.0 g/dL   RDW 16.4 (H) 11.5 - 15.5 %   Platelets 193 150 - 400 K/uL    Comment: SPECIMEN CHECKED FOR CLOTS REPEATED TO VERIFY PLATELET COUNT CONFIRMED BY SMEAR    Neutrophils Relative % 83 (H) 43 - 77 %   Lymphocytes Relative 7 (L) 12 - 46 %   Monocytes Relative 8 3 - 12 %   Eosinophils Relative 2 0 - 5 %   Basophils Relative 0 0 - 1 %   Neutro Abs 5.9 1.7 - 7.7 K/uL   Lymphs Abs 0.5 (L) 0.7 - 4.0 K/uL   Monocytes Absolute 0.6 0.1 - 1.0 K/uL   Eosinophils Absolute 0.1 0.0 - 0.7 K/uL   Basophils Absolute 0.0 0.0 - 0.1 K/uL   Smear Review MORPHOLOGY UNREMARKABLE   Hepatic function panel     Status: Abnormal   Collection Time: 01/25/15  2:35 AM  Result Value Ref Range   Total Protein 5.3 (L) 6.5 - 8.1 g/dL   Albumin 2.0 (L) 3.5 - 5.0 g/dL   AST 98 (H) 15 - 41 U/L   ALT 149 (H) 17 - 63 U/L   Alkaline Phosphatase 100 38 - 126 U/L   Total Bilirubin 1.9 (H) 0.3 - 1.2 mg/dL   Bilirubin, Direct 0.8 (H) 0.1 - 0.5 mg/dL   Indirect Bilirubin 1.1 (H) 0.3 - 0.9 mg/dL    Ct Abdomen Pelvis Wo Contrast  01/24/2015  CLINICAL DATA:  Acute renal failure. Abnormal labs. Urinary tract infection.  EXAM: CT ABDOMEN AND PELVIS WITHOUT CONTRAST  TECHNIQUE: Multidetector CT imaging of the abdomen and pelvis was performed following the standard protocol without IV contrast.  COMPARISON:   11/24/2012  FINDINGS: Small bilateral pleural effusions with basilar atelectasis. Small pericardial effusion. Esophageal hiatal hernia.  The gallbladder is distended without inflammatory infiltration. No radiopaque stones identified. No bile duct dilatation. Calcified granulomas in the spleen. The unenhanced appearance of the liver, pancreas, adrenal glands, inferior vena cava, and retroperitoneal lymph nodes is unremarkable. Calcification of abdominal aorta without aneurysm. Multiple exophytic lesions in the kidneys most likely representing cysts. Small hemorrhagic cyst in the left kidney. No significant changes since previous study. There is hydronephrosis and hydroureter on the left. No obstructing stone is demonstrated. This could represent a non radiopaque stone or reflux. Stomach, small bowel, and colon are not abnormally distended. Diffusely stool-filled colon. Small umbilical hernia containing a portion of small bowel. No proximal obstruction.  Pelvis: Appendix is normal. Prostate gland is not enlarged. There is a Foley catheter in the bladder. Gas in the bladder is likely due to catheter insertion. The bladder is decompressed but the bladder wall appears to be diffusely thickened, suggesting cystitis. Rectal wall is also thickened suggesting proctitis. No free or loculated pelvic fluid collections. No pelvic mass or lymphadenopathy. Scoliosis and degenerative changes in the spine.  IMPRESSION: Diffuse wall thickening in the bladder suggest probable cystitis. Left hydronephrosis and hydroureter. No obstructing stones demonstrated. This could indicate a non radiopaque stone versus reflux disease. Probable bilateral renal cysts. Diffuse thickening of the rectal wall suggesting proctitis. Small umbilical hernia containing small bowel but without obstruction.   Electronically Signed   By: Lucienne Capers M.D.   On: 01/24/2015 22:50   Dg Chest Port 1 View  01/24/2015   CLINICAL DATA:  Unwitnessed fall this  morning.  EXAM: PORTABLE CHEST - 1 VIEW  COMPARISON:  01/08/2014  FINDINGS: There is moderate unchanged cardiomegaly. There is no confluent airspace consolidation. There is no large effusion. Hilar and mediastinal contours are unremarkable and unchanged.  IMPRESSION: Cardiomegaly.  No acute cardiopulmonary findings.   Electronically Signed   By: Andreas Newport M.D.   On: 01/24/2015 20:26    Review of Systems  Constitutional: Positive for malaise/fatigue. Negative for fever.  HENT: Negative.   Eyes: Negative.   Respiratory: Negative.   Cardiovascular: Negative.   Skin: Negative.   Neurological: Positive for focal weakness and loss of consciousness.  Psychiatric/Behavioral: Positive for memory loss.   Blood pressure 101/46, pulse 80, temperature 98.5 F (36.9 C), temperature source Rectal, resp. rate 20, SpO2 97 %. Physical Exam  Constitutional:  Ill appearing, stigmata of prior CVA with hemiparesis. Essentially non-verbal. AOx0. Non-combative. No distress.   HENT:  Head: Normocephalic.  Cardiovascular: Normal rate.   Respiratory: Effort normal.  Non-labored on room air  GI: Soft.  Genitourinary:  No CVAT. TRaumatic hypospadias to midshaft with foley in place with grossly purlulent urine.   Musculoskeletal: Normal range of motion.  Skin: Skin is warm.    Assessment/Plan:  1 - Acute on Chronic Renal Failure - likely combination chronic medial renal disease, left atrophic kidney, but mostly acute dehydration. I do not feel and sort of acute obstruction to his atrophic kidney would explain significant GFR decline. Rec trial of hydration, medical treatment of infection. Given his very poor overall medical condition would proceed with left renal decompression only as last resort, and only  after lengthy discussion of pros/cons with family who at this point is reluctant for procedural intervention (I discussed with pt's niece today at length).   2 - Atrophic Left Kidney with  Hydronephrosis - left kidney likely contributing to GFR minimally at baseline, fortunately Rt side with preserved parenchyma, plan as per above.   3 -  Urinary Tract Infection - as no signs of urosepsis, agree with empiric medical treatment. Should condition deteriortate, may consider left nephrostomy.   4 -  Traumatic Hypospadias - result of chronic catheter, now off tension.   5 -  Mobility Incontinence- continue current chronic foley as he is not candidate for SP Tube or any sort of self cath.   6 - Bilateral Renal Cysts - non-complex, no furhter evaluaiton warranted.   7 - Will follow.   ,  01/25/2015, 7:43 AM

## 2015-01-25 NOTE — Evaluation (Signed)
Clinical/Bedside Swallow Evaluation Patient Details  Name: [name] MRN: [id number] Date of Birth: 1945-10-13  Today's Date: 01/25/2015 Time: SLP Start Time (ACUTE ONLY): 1438 SLP Stop Time (ACUTE ONLY): 1505 SLP Time Calculation (min) (ACUTE ONLY): 27 min  Past Medical History:  Past Medical History  Diagnosis Date  . Dementia   . Hypertension   . Hyperlipemia   . GERD (gastroesophageal reflux disease)   . BPH (benign prostatic hyperplasia)   . Cardiomyopathy: Per 2 D echo 10/22/13 EF 30-35% 01/08/2014  . Hydronephrosis   . Stroke   . Hemiparesis     right hemiparesis  . Dysphasia   . Aphasia     expressive aphasia  . Hydronephrosis   . History of hiatal hernia   . Esophagitis   . Upper GI bleed   . Dysrhythmia     hx nonsustained vt  . Anemia   . CHF (congestive heart failure)   . Chronic indwelling Foley catheter   . Diabetes mellitus without complication    Past Surgical History:  Past Surgical History  Procedure Laterality Date  . Esophagogastroduodenoscopy N/A 11/11/2012    Procedure: ESOPHAGOGASTRODUODENOSCOPY (EGD);  Surgeon: Lear Ng, MD;  Location: [facility] ENDOSCOPY;  Service: Endoscopy;  Laterality: N/A;  . Subxyphoid pericardial window N/A 11/13/2012    Procedure: SUBXYPHOID PERICARDIAL WINDOW;  Surgeon: Grace Isaac, MD;  Location: [place];  Service: Thoracic;  Laterality: N/A;  . Esophagogastroduodenoscopy N/A 11/25/2012    Procedure: ESOPHAGOGASTRODUODENOSCOPY (EGD);  Surgeon: Winfield Cunas., MD;  Location[facility] ENDOSCOPY;  Service: Endoscopy;  Laterality: N/A;  . Esophagogastroduodenoscopy N/A 01/13/2014    Procedure: ESOPHAGOGASTRODUODENOSCOPY (EGD);  Surgeon: Wonda Horner, MD;  Location: [facility] ENDOSCOPY;  Service: Endoscopy;  Laterality: N/A;  . Peg placement    . Aicd implantation     HPI:  69 yo male with significant medical hx including CVA, dementia, hydronephrosis, HTN, GERD, BPH, cardiomyopathy, gastroparesis, parayltic ileus,  esophagitis adm with poor po intake, N/V x1 and recent treatment for UTI.  Pt resides at [facility] and is on a dysphagia diet at this facility.  Swallow evaluation ordered.   Assessment / Plan / Recommendation Clinical Impression  Pt's mental status is largest barrier to swallowing safety at this time.  Pt was lethargic but did respond to SLP verbal cues, closing lips on oral suction and verbalizing. Oral care provided by SlP using toothette, mouthrinse and suction.  Pt was xerostomic with retained viscous secretions-indicative of poor oral awareness.    SLP administered single bolus of jello after pt awakened with verbal/tactile stimulation.  Pt did not orally transit bolus and SLP removed with oral suction.  Did not attempt further boluses due to pt's inability to maintain LOA and poor awareness to po.   Brother *[name]* present (pt HCPOA) and SLP educated him to findings of previous evaluation 12/2013 and current recommendations.  [name] reports pt with worsening swallow ability requiring chopped foods and now relies on others to feed him.  Recommend npo with oral care at this time.    Consider trying pills with applesauce (crushed) only if fully alert and accepting.  SLP to follow up for readiness for po intake-hopeful with improved medical status- and family education.     Aspiration Risk  Severe    Diet Recommendation NPO   Medication Administration: Via alternative means (? try in applesauce if pt fully alert)    Other  Recommendations Oral Care Recommendations: Oral care QID   Follow Up Recommendations  Frequency and Duration    1 week   Pertinent Vitals/Pain Afebrile, decreased      Swallow Study Prior Functional Status   progressive dysphagia per brother, now needing chopped foods    General Date of Onset: 01/25/15 Other Pertinent Information: 69 yo male with significant medical hx including CVA, dementia, hydronephrosis, HTN, GERD, BPH, cardiomyopathy, gastroparesis,  parayltic ileus, esophagitis adm with poor po intake, N/V x1 and recent treatment for UTI.  Pt resides at [facility] and is on a dysphagia diet at this facility.  Swallow evaluation ordered. Type of Study: Bedside swallow evaluation Previous Swallow Assessment: clinical swallow evaluation 12/2013 mild=mod oral dysphagia - decreased oral transit/lingual coordination, suspected delayed pharyngeal swallow, rec thin clears= advance to soft/thin when GI approves - pt admitted with coffee ground emesis at that time Diet Prior to this Study: Regular;Thin liquids Temperature Spikes Noted: No Respiratory Status: Room air History of Recent Intubation: No Behavior/Cognition: Lethargic/Drowsy Oral Cavity - Dentition: Adequate natural dentition/normal for age Self-Feeding Abilities: Total assist (brother reports pt now needs assistance to eat) Patient Positioning: Upright in bed Baseline Vocal Quality: Normal Volitional Cough: Cognitively unable to elicit Volitional Swallow: Unable to elicit    Oral/Motor/Sensory Function Overall Oral Motor/Sensory Function:  (pt did not follow commands, able to seal lips on oral suction, dysarthric, h/o CVA with aphasia/right sided weakness)   Ice Chips Ice chips: Not tested   Thin Liquid Thin Liquid: Not tested    Nectar Thick Nectar Thick Liquid: Not tested   Honey Thick Honey Thick Liquid: Not tested   Puree Puree: Impaired Presentation: Spoon Oral Phase Impairments: Reduced labial seal;Reduced lingual movement/coordination;Impaired anterior to posterior transit Oral Phase Functional Implications: Oral holding (slp suctioned bolus from pt's mouth after approximately 10 seconds of lack of movement) Other Comments: JELLO   Solid   GO    Solid: Not tested       Luanna Salk, [facility] SLP [phone]

## 2015-01-25 NOTE — ED Notes (Addendum)
HOSPITALIST SENDIL K MD PRESENT TO SEE PT

## 2015-01-25 NOTE — ED Notes (Signed)
UROLOGY PRESENT TO SPEAK TO PT

## 2015-01-25 NOTE — ED Notes (Signed)
Lab tech drew hepatitis panel, [place]

## 2015-01-26 ENCOUNTER — Inpatient Hospital Stay (HOSPITAL_COMMUNITY): Payer: Medicare Other

## 2015-01-26 DIAGNOSIS — Z87898 Personal history of other specified conditions: Secondary | ICD-10-CM

## 2015-01-26 DIAGNOSIS — I5022 Chronic systolic (congestive) heart failure: Secondary | ICD-10-CM

## 2015-01-26 DIAGNOSIS — Z789 Other specified health status: Secondary | ICD-10-CM | POA: Diagnosis present

## 2015-01-26 LAB — BASIC METABOLIC PANEL
Anion gap: 10 (ref 5–15)
BUN: 125 mg/dL — AB (ref 6–20)
CO2: 32 mmol/L (ref 22–32)
CREATININE: 6.44 mg/dL — AB (ref 0.61–1.24)
Calcium: 8 mg/dL — ABNORMAL LOW (ref 8.9–10.3)
Chloride: 109 mmol/L (ref 101–111)
GFR calc Af Amer: 9 mL/min — ABNORMAL LOW (ref >60)
GFR, EST NON AFRICAN AMERICAN: 8 mL/min — AB (ref >60)
Glucose, Bld: 153 mg/dL — ABNORMAL HIGH (ref 65–99)
POTASSIUM: 3.5 mmol/L (ref 3.5–5.1)
Sodium: 151 mmol/L — ABNORMAL HIGH (ref 135–145)

## 2015-01-26 LAB — COMPREHENSIVE METABOLIC PANEL
ALK PHOS: 100 U/L (ref 38–126)
ALT: 118 U/L — AB (ref 17–63)
AST: 77 U/L — ABNORMAL HIGH (ref 15–41)
Albumin: 1.9 g/dL — ABNORMAL LOW (ref 3.5–5.0)
Anion gap: 12 (ref 5–15)
BUN: 137 mg/dL — AB (ref 6–20)
CO2: 25 mmol/L (ref 22–32)
Calcium: 7.8 mg/dL — ABNORMAL LOW (ref 8.9–10.3)
Chloride: 112 mmol/L — ABNORMAL HIGH (ref 101–111)
Creatinine, Ser: 6.79 mg/dL — ABNORMAL HIGH (ref 0.61–1.24)
GFR, EST AFRICAN AMERICAN: 9 mL/min — AB (ref >60)
GFR, EST NON AFRICAN AMERICAN: 7 mL/min — AB (ref >60)
GLUCOSE: 146 mg/dL — AB (ref 65–99)
POTASSIUM: 3.9 mmol/L (ref 3.5–5.1)
SODIUM: 149 mmol/L — AB (ref 135–145)
Total Bilirubin: 1.5 mg/dL — ABNORMAL HIGH (ref 0.3–1.2)
Total Protein: 5.2 g/dL — ABNORMAL LOW (ref 6.5–8.1)

## 2015-01-26 LAB — PHOSPHORUS: Phosphorus: 3.3 mg/dL (ref 2.5–4.6)

## 2015-01-26 MED ORDER — DEXTROSE 5 % IV SOLN
INTRAVENOUS | Status: DC
  Administered 2015-01-26 – 2015-02-01 (×14): via INTRAVENOUS

## 2015-01-26 NOTE — Procedures (Signed)
Central Venous Catheter Insertion Procedure Note [name] [id number] 1945/09/21  Procedure: Insertion of Central Venous Catheter Indications: Assessment of intravascular volume, Drug and/or fluid administration and Frequent blood sampling  Procedure Details Consent: Risks of procedure as well as the alternatives and risks of each were explained to the (patient/caregiver).  Consent for procedure obtained. Time Out: Verified patient identification, verified procedure, site/side was marked, verified correct patient position, special equipment/implants available, medications/allergies/relevent history reviewed, required imaging and test results available.  Performed Real time [place] was used to ID and cannulate the vessel  Maximum sterile technique was used including antiseptics, cap, gloves, gown, hand hygiene, mask and sheet. Skin prep: Chlorhexidine; local anesthetic administered A antimicrobial bonded/coated triple lumen catheter was placed in the right internal jugular vein using the Seldinger technique.  Evaluation Blood flow good Complications: No apparent complications Patient did tolerate procedure well. Chest X-ray ordered to verify placement.  CXR: pending.  [name],[name] 01/26/2015, 1:02 PM

## 2015-01-26 NOTE — Progress Notes (Signed)
PROGRESS NOTE  [name]:[id number] DOB: 10-23-1945 DOA: 01/24/2015 PCP: Merlene Laughter, MD  HPI/Recap of past 2 hours: 69 year old male with past history of stroke causing right-sided hemiplegia, dementia, cardiopathy with EF of 30%  and chronic kidney disease stage II with baseline creatinine around 1.4 brought in 5/10 for increasing confusion and patient found to be in acute renal failure with creatinine of 9.1 and BUN of 182. Other labs noted for normal white blood cell count, large urinary tract infection and metabolic acidosis. Patient started on bicarbonate drip, CT scan done noting some left-sided hydronephrosis, but no signs obstructing stone. By following morning, patient's renal function slowly improving.   Overnight no issues. Creatinine continues to come down very slowly. Today, patient starting to wake up slightly. He lost IV access and critical-care placed central line. Patient waking up slightly, denies any shortness of breath or pain  Assessment/Plan: Active Problems:   Dementia with acute behavioral disturbance from encephalopathy from uremia. Starting to improve.  Hyperkalemia: Secondary to renal failure, has stopped patient's supplemental potassium. Received one dose of calcium carbonate. Now resolved and she came out    Hemiparesis affecting right side as late effect of stroke: Stable  Acute kidney injury in the setting of stage II chronic kidney disease: Looks to be prerenal from severe dehydration. Aggressively hydrate with bicarbonate drip. Urology and nephrology following. At this time, dialysis was not indicated and patient may not be dialysis candidate regardless. Continue in stepdown  Until creatinine further improved    Cardiomyopathy with chronic systolic heart failure: Per 2 D echo 10/22/13 EF 30-35% likely we will cause volume overload due to aggressive hydration. Monitoring respiratory function, which is currently normal    Acute  encephalopathy: Secondary to uremia    Metabolic acidosis: Secondary to uremia. Resolved   UTI (lower urinary tract infection): Patient also with chronic catheter, so difficult to distinguish if this is an acute infection   Code Status: Full code  Family Communication: Left message with family  Disposition Plan: Continue step down   Consultants:  Urology  Nephrology  Procedures:  None  Antibiotics:  IV Rocephin 5/10-present  Objective: BP 117/54 mmHg  Pulse 88  Temp(Src) 97.4 F (36.3 C) (Axillary)  Resp 18  Ht 5\' 9"  (1.753 m)  Wt 80 kg (176 lb 5.9 oz)  BMI 26.03 kg/m2  SpO2 98%  Intake/Output Summary (Last 24 hours) at 01/26/15 1338 Last data filed at 01/26/15 1014  Gross per 24 hour  Intake   2850 ml  Output    905 ml  Net   1945 ml   Filed Weights   01/25/15 1255 01/26/15 0136  Weight: 79.1 kg (174 lb 6.1 oz) 80 kg (176 lb 5.9 oz)    Exam:   General:  wakes up a little bit, still somewhat somnolent  Cardiovascular: Regular rate  and rhythm, S1-S2  Respiratory: clear to auscultation bilaterally  Abdomen: soft, nondistended, hypoactive bowel sounds  Musculoskeletal: no clubbing or cyanosis, trace pitting edema   Data Reviewed: Basic Metabolic Panel:  Recent Labs Lab 01/24/15 1912 01/24/15 2350 01/25/15 0235 01/26/15 0345  NA 141  --  148* 149*  K 6.0*  --  5.0 3.9  CL 114*  --  114* 112*  CO2 12*  --  16* 25  GLUCOSE 204*  --  173* 146*  BUN 182*  --  157* 137*  CREATININE 9.10* 8.15* 7.96* 6.79*  CALCIUM 8.8*  --  8.4* 7.8*  MG  1.5*  --   --   --   PHOS 2.7  --   --  3.3   Liver Function Tests:  Recent Labs Lab 01/24/15 1912 01/25/15 0235 01/26/15 0345  AST 130* 98* 77*  ALT 193* 149* 118*  ALKPHOS 110 100 100  BILITOT 2.0* 1.9* 1.5*  PROT 6.1* 5.3* 5.2*  ALBUMIN 2.3* 2.0* 1.9*   No results for input(s): LIPASE, AMYLASE in the last 168 hours.  Recent Labs Lab 01/24/15 2350  AMMONIA 36*   CBC:  Recent Labs Lab  01/24/15 1912 01/24/15 2350 01/25/15 0235  WBC 8.4 9.2 7.1  NEUTROABS 7.0  --  5.9  HGB 12.0* 10.6* 10.7*  HCT 35.5* 31.9* 32.2*  MCV 79.8 79.2 79.3  PLT 193 181 193   Cardiac Enzymes:   No results for input(s): CKTOTAL, CKMB, CKMBINDEX, TROPONINI in the last 168 hours. BNP (last 3 results) No results for input(s): BNP in the last 8760 hours.  ProBNP (last 3 results) No results for input(s): PROBNP in the last 8760 hours.  CBG:  Recent Labs Lab 01/24/15 1824 01/24/15 2114 01/24/15 2257  GLUCAP 50* 91 119*    Recent Results (from the past 240 hour(s))  MRSA PCR Screening     Status: Abnormal   Collection Time: 01/25/15 12:51 PM  Result Value Ref Range Status   MRSA by PCR POSITIVE (A) NEGATIVE Final    Comment:        The GeneXpert MRSA Assay (FDA approved for NASAL specimens only), is one component of a comprehensive MRSA colonization surveillance program. It is not intended to diagnose MRSA infection nor to guide or monitor treatment for MRSA infections. RESULT CALLED TO, READ BACK BY AND VERIFIED WITH: ARNOLD,A @ 5631 ON [id number] BY POTEAT,S      Studies: No results found.  Scheduled Meds: . cefTRIAXone (ROCEPHIN)  IV  1 g Intravenous Q24H  . Chlorhexidine Gluconate Cloth  6 each Topical Q0600  . docusate sodium  100 mg Oral BID  . enoxaparin (LOVENOX) injection  30 mg Subcutaneous Q24H  . feeding supplement (ENSURE ENLIVE)  237 mL Oral BID BM  . ferrous sulfate  325 mg Oral BID WC  . mupirocin ointment  1 application Nasal BID  . sodium chloride  3 mL Intravenous Q12H    Continuous Infusions: . dextrose 125 mL/hr at 01/26/15 1016     Time spent: 25 min  [facility] Pager [phone]. If 7PM-7AM, please contact night-coverage at [email or web address], password [facility] 01/26/2015, 1:38 PM  LOS: 2 days

## 2015-01-26 NOTE — Progress Notes (Signed)
S: Non verbal O:BP 124/54 mmHg  Pulse 82  Temp(Src) 98.2 F (36.8 C) (Axillary)  Resp 16  Ht _0  (1.753 m)  Wt 80 kg (176 lb 5.9 oz)  BMI 26.03 kg/m2  SpO2 99%  Intake/Output Summary (Last 24 hours) at 01/26/15 1020 Last data filed at 01/26/15 1014  Gross per 24 hour  Intake   3150 ml  Output   1580 ml  Net   1570 ml   Weight change:  Gen: Somnolent CVS:RRR Resp: Clear Abd: + BS NTND Ext: no edema in legs.  Swelling in LUA from IV NEURO: Contracture Rt hand.  Somnolent GU: Foley in place   . cefTRIAXone (ROCEPHIN)  IV  1 g Intravenous Q24H  . Chlorhexidine Gluconate Cloth  6 each Topical Q0600  . docusate sodium  100 mg Oral BID  . enoxaparin (LOVENOX) injection  30 mg Subcutaneous Q24H  . feeding supplement (ENSURE ENLIVE)  237 mL Oral BID BM  . ferrous sulfate  325 mg Oral BID WC  . mupirocin ointment  1 application Nasal BID  . sodium chloride  3 mL Intravenous Q12H   Ct Abdomen Pelvis Wo Contrast  01/24/2015   CLINICAL DATA:  Acute renal failure. Abnormal labs. Urinary tract infection.  EXAM: CT ABDOMEN AND PELVIS WITHOUT CONTRAST  TECHNIQUE: Multidetector CT imaging of the abdomen and pelvis was performed following the standard protocol without IV contrast.  COMPARISON:  11/24/2012  FINDINGS: Small bilateral pleural effusions with basilar atelectasis. Small pericardial effusion. Esophageal hiatal hernia.  The gallbladder is distended without inflammatory infiltration. No radiopaque stones identified. No bile duct dilatation. Calcified granulomas in the spleen. The unenhanced appearance of the liver, pancreas, adrenal glands, inferior vena cava, and retroperitoneal lymph nodes is unremarkable. Calcification of abdominal aorta without aneurysm. Multiple exophytic lesions in the kidneys most likely representing cysts. Small hemorrhagic cyst in the left kidney. No significant changes since previous study. There is hydronephrosis and hydroureter on the left. No obstructing  stone is demonstrated. This could represent a non radiopaque stone or reflux. Stomach, small bowel, and colon are not abnormally distended. Diffusely stool-filled colon. Small umbilical hernia containing a portion of small bowel. No proximal obstruction.  Pelvis: Appendix is normal. Prostate gland is not enlarged. There is a Foley catheter in the bladder. Gas in the bladder is likely due to catheter insertion. The bladder is decompressed but the bladder wall appears to be diffusely thickened, suggesting cystitis. Rectal wall is also thickened suggesting proctitis. No free or loculated pelvic fluid collections. No pelvic mass or lymphadenopathy. Scoliosis and degenerative changes in the spine.  IMPRESSION: Diffuse wall thickening in the bladder suggest probable cystitis. Left hydronephrosis and hydroureter. No obstructing stones demonstrated. This could indicate a non radiopaque stone versus reflux disease. Probable bilateral renal cysts. Diffuse thickening of the rectal wall suggesting proctitis. Small umbilical hernia containing small bowel but without obstruction.   Electronically Signed   By: Lucienne Capers M.D.   On: 01/24/2015 22:50   Dg Chest Port 1 View  01/24/2015   CLINICAL DATA:  Unwitnessed fall this morning.  EXAM: PORTABLE CHEST - 1 VIEW  COMPARISON:  01/08/2014  FINDINGS: There is moderate unchanged cardiomegaly. There is no confluent airspace consolidation. There is no large effusion. Hilar and mediastinal contours are unremarkable and unchanged.  IMPRESSION: Cardiomegaly.  No acute cardiopulmonary findings.   Electronically Signed   By: Andreas Newport M.D.   On: 01/24/2015 20:26   BMET    Component Value Date/Time  NA 149* 01/26/2015 0345   K 3.9 01/26/2015 0345   CL 112* 01/26/2015 0345   CO2 25 01/26/2015 0345   GLUCOSE 146* 01/26/2015 0345   BUN 137* 01/26/2015 0345   CREATININE 6.79* 01/26/2015 0345   CALCIUM 7.8* 01/26/2015 0345   GFRNONAA 7* 01/26/2015 0345   GFRAA 9*  01/26/2015 0345   CBC    Component Value Date/Time   WBC 7.1 01/25/2015 0235   RBC 4.06* 01/25/2015 0235   HGB 10.7* 01/25/2015 0235   HCT 32.2* 01/25/2015 0235   PLT 193 01/25/2015 0235   MCV 79.3 01/25/2015 0235   MCH 26.4 01/25/2015 0235   MCHC 33.2 01/25/2015 0235   RDW 16.4* 01/25/2015 0235   LYMPHSABS 0.5* 01/25/2015 0235   MONOABS 0.6 01/25/2015 0235   EOSABS 0.1 01/25/2015 0235   BASOSABS 0.0 01/25/2015 0235     Assessment: 1. Acute on CKD 3 secondary to what appears to be atrophic/ hydronephrotic Lt kidney, UTI, hypotension +/- volume depletion.  UO remains good and Scr decreasing 2. Hyperkalemia, resolved 3. Met acidosis, improved   Plan: 1. DC IV bicarb as you have done 2. Recheck labs in AM.  Conservative therapy best approach.    Zaleigh Bermingham T

## 2015-01-26 NOTE — Progress Notes (Signed)
Speech Language Pathology Treatment: Dysphagia  Patient Details Name: [name] MRN: [id number] DOB: 01-10-1946 Today's Date: 01/26/2015 Time: 3343-5686 SLP Time Calculation (min) (ACUTE ONLY): 35 min  Assessment / Plan / Recommendation Clinical Impression  Pt demonstrates mild improvement with mental status today.  Pt today opened oral cavity to accept boluses of jello, nectar thick juice, water and ice.  Poor oral transiting noted with excessive oral holding despite dry spoon stimulation, and verbal/visual cues to swallow.  Oral suctioning required of jello and nectar thickened juice after greater than 2 minutes of oral holding.  Pt did elicit swallow with thin water but with excessive delay and mental status, he continues to be a high risk.    Hopeful for ongoing clinical improvement with medical/mental status improvement.  SLP educated brother [name] to findings, recommendations and he reported understanding to clinical reasoning for NPO.  SLP to follow up next date for po/instrumental evaluation readiness.      HPI Other Pertinent Information: 69 yo male with significant medical hx including CVA, dementia, hydronephrosis, HTN, GERD, BPH, cardiomyopathy, gastroparesis, parayltic ileus, esophagitis adm with poor po intake, N/V x1 and recent treatment for UTI.  Pt resides at [facility] and is on a dysphagia diet at this facility.  Swallow evaluation ordered.   Pertinent Vitals Pain Assessment: No/denies pain  SLP Plan  Continue with current plan of care    Recommendations Diet recommendations: NPO Medication Administration: Via alternative means              Oral Care Recommendations: Oral care QID Plan: Continue with current plan of care    [place], [place] SLP [phone]

## 2015-01-26 NOTE — Progress Notes (Signed)
CSW continuing to follow.   Per chart, pt a long term resident at [facility].   CSW made attempts to contact pt brother via telephone, but no answer and no voicemail in order to leave a message.  CSW to continue attempts to reach pt family. Per chart, pt oriented to self only.  CSW to continue to follow.  Alison Murray, MSW, [place] [employer] [phone]

## 2015-01-26 NOTE — Progress Notes (Signed)
Patient is A/Ox1. He is on room air. No signs of distress or discomfort. A RIJ was placed today. Consent was obtained through niece by phone. Patient tolerated procedure well. Swallow evaluation was done by speech therapy today. Patient will continue to be NPO.

## 2015-01-27 ENCOUNTER — Telehealth: Payer: Self-pay | Admitting: Cardiovascular Disease

## 2015-01-27 LAB — BASIC METABOLIC PANEL
ANION GAP: 9 (ref 5–15)
BUN: 115 mg/dL — ABNORMAL HIGH (ref 6–20)
CHLORIDE: 107 mmol/L (ref 101–111)
CO2: 32 mmol/L (ref 22–32)
CREATININE: 5.92 mg/dL — AB (ref 0.61–1.24)
Calcium: 7.8 mg/dL — ABNORMAL LOW (ref 8.9–10.3)
GFR calc non Af Amer: 9 mL/min — ABNORMAL LOW (ref >60)
GFR, EST AFRICAN AMERICAN: 10 mL/min — AB (ref >60)
Glucose, Bld: 149 mg/dL — ABNORMAL HIGH (ref 65–99)
Potassium: 3.3 mmol/L — ABNORMAL LOW (ref 3.5–5.1)
Sodium: 148 mmol/L — ABNORMAL HIGH (ref 135–145)

## 2015-01-27 MED ORDER — SODIUM CHLORIDE 0.9 % IJ SOLN
10.0000 mL | Freq: Two times a day (BID) | INTRAMUSCULAR | Status: DC
  Administered 2015-01-28 – 2015-01-30 (×2): 10 mL
  Administered 2015-01-30: 20 mL

## 2015-01-27 MED ORDER — POTASSIUM CHLORIDE 10 MEQ/50ML IV SOLN
10.0000 meq | INTRAVENOUS | Status: AC
  Administered 2015-01-27 (×3): 10 meq via INTRAVENOUS
  Filled 2015-01-27 (×4): qty 50

## 2015-01-27 MED ORDER — SODIUM CHLORIDE 0.9 % IJ SOLN
10.0000 mL | INTRAMUSCULAR | Status: DC | PRN
  Administered 2015-01-27 – 2015-01-28 (×2): 10 mL
  Administered 2015-01-29 – 2015-02-01 (×3): 20 mL
  Filled 2015-01-27 (×5): qty 40

## 2015-01-27 NOTE — Progress Notes (Signed)
ANTIBIOTIC CONSULT NOTE   Pharmacy Consult for Rocephin Indication: UTI  No Known Allergies  Patient Measurements: Height: 5\' 9"  (175.3 cm) Weight: 177 lb 7.5 oz (80.5 kg) IBW/kg (Calculated) : 70.7 Wt=87 kg  Vital Signs: Temp: 98.5 F (36.9 C) (05/13 1201) Temp Source: Oral (05/13 1201) BP: 121/51 mmHg (05/13 1000) Pulse Rate: 88 (05/13 1000) Intake/Output from previous day: 05/12 0701 - 05/13 0700 In: 2210 [I.V.:2160; IV Piggyback:50] Out: 2482 [Urine:1185] Intake/Output from this shift: Total I/O In: 10 [I.V.:10] Out: 125 [Urine:125]  Labs:  Recent Labs  01/24/15 1912 01/24/15 2350 01/25/15 0235 01/26/15 0345 01/26/15 1558 01/27/15 0304  WBC 8.4 9.2 7.1  --   --   --   HGB 12.0* 10.6* 10.7*  --   --   --   PLT 193 181 193  --   --   --   CREATININE 9.10* 8.15* 7.96* 6.79* 6.44* 5.92*   Estimated Creatinine Clearance: 11.8 mL/min (by C-G formula based on Cr of 5.92). No results for input(s): VANCOTROUGH, VANCOPEAK, VANCORANDOM, GENTTROUGH, GENTPEAK, GENTRANDOM, TOBRATROUGH, TOBRAPEAK, TOBRARND, AMIKACINPEAK, AMIKACINTROU, AMIKACIN in the last 72 hours.   Microbiology: Recent Results (from the past 720 hour(s))  Culture, Urine     Status: None (Preliminary result)   Collection Time: 01/24/15  4:00 PM  Result Value Ref Range Status   Specimen Description URINE, CATHETERIZED  Final   Special Requests NONE  Final   Culture   Final    Culture reincubated for better growth Performed at [facility]    Report Status PENDING  Incomplete  MRSA PCR Screening     Status: Abnormal   Collection Time: 01/25/15 12:51 PM  Result Value Ref Range Status   MRSA by PCR POSITIVE (A) NEGATIVE Final    Comment:        The GeneXpert MRSA Assay (FDA approved for NASAL specimens only), is one component of a comprehensive MRSA colonization surveillance program. It is not intended to diagnose MRSA infection nor to guide or monitor treatment for MRSA  infections. RESULT CALLED TO, READ BACK BY AND VERIFIED WITH: ARNOLD,A @ 5003 ON [id number] BY POTEAT,S    Medical History: Past Medical History  Diagnosis Date  . Dementia   . Hypertension   . Hyperlipemia   . GERD (gastroesophageal reflux disease)   . BPH (benign prostatic hyperplasia)   . Cardiomyopathy: Per 2 D echo 10/22/13 EF 30-35% 01/08/2014  . Hydronephrosis   . Stroke   . Hemiparesis     right hemiparesis  . Dysphasia   . Aphasia     expressive aphasia  . Hydronephrosis   . History of hiatal hernia   . Esophagitis   . Upper GI bleed   . Dysrhythmia     hx nonsustained vt  . Anemia   . CHF (congestive heart failure)   . Chronic indwelling Foley catheter   . Diabetes mellitus without complication    Medications:  Scheduled:  . cefTRIAXone (ROCEPHIN)  IV  1 g Intravenous Q24H  . Chlorhexidine Gluconate Cloth  6 each Topical Q0600  . docusate sodium  100 mg Oral BID  . enoxaparin (LOVENOX) injection  30 mg Subcutaneous Q24H  . feeding supplement (ENSURE ENLIVE)  237 mL Oral BID BM  . ferrous sulfate  325 mg Oral BID WC  . mupirocin ointment  1 application Nasal BID  . potassium chloride  10 mEq Intravenous Q1 Hr x 3  . sodium chloride  3 mL Intravenous Q12H  Assessment: 42 yoM present to ED with altered mental status found to have UTI.  Rocephin per Rx for UTI begun 5/10. Hx of CKD 3.  SCr continues to slowly decrease  Urine culture no growth, reincubated  Day 4 Rocephin  Goal of Therapy:  Treat UTI  Plan:   Rocephin 1Gm IV q24h  Pharmacy will sign off protocol, Rocephin needs no adjustment for renal function  Will continue to monitor   Minda Ditto PharmD Pager [phone] 01/27/2015, 12:50 PM

## 2015-01-27 NOTE — Progress Notes (Signed)
Patient is A/Ox1. He is on room air. No signs of discomfort or distress. Swallow evaluation done by speech therapy today. Patient to remain NPO. Orders for transfer to [place] today. Report given to RN. Patient transferred by bed accompanied by NT and charge RN.

## 2015-01-27 NOTE — Progress Notes (Signed)
S: Tries to talk O:BP 121/51 mmHg  Pulse 88  Temp(Src) 98.6 F (37 C) (Oral)  Resp 18  Ht $R'5\' 9"'CF$  (1.753 m)  Wt 80.5 kg (177 lb 7.5 oz)  BMI 26.20 kg/m2  SpO2 96%  Intake/Output Summary (Last 24 hours) at 01/27/15 1152 Last data filed at 01/27/15 0747  Gross per 24 hour  Intake   2200 ml  Output   1310 ml  Net    890 ml   Weight change: 1.4 kg (3 lb 1.4 oz) Gen: More alert than he has been CVS:RRR Resp: Clear Abd: + BS NTND Ext: no edema  NEURO: Contracture Rt Rt IJ triple lumen   . cefTRIAXone (ROCEPHIN)  IV  1 g Intravenous Q24H  . Chlorhexidine Gluconate Cloth  6 each Topical Q0600  . docusate sodium  100 mg Oral BID  . enoxaparin (LOVENOX) injection  30 mg Subcutaneous Q24H  . feeding supplement (ENSURE ENLIVE)  237 mL Oral BID BM  . ferrous sulfate  325 mg Oral BID WC  . mupirocin ointment  1 application Nasal BID  . sodium chloride  3 mL Intravenous Q12H   Dg Chest Port 1 View  01/26/2015   CLINICAL DATA:  Central line placement.  EXAM: PORTABLE CHEST - 1 VIEW  COMPARISON:  01/24/2015  FINDINGS: Central line tip is in the superior vena cava in good position just above the cavoatrial junction no pneumothorax. Cardiomegaly, unchanged. Was are clear. Thoracolumbar scoliosis.  IMPRESSION: Central line in good position.  No acute abnormality.   Electronically Signed   By: Lorriane Shire M.D.   On: 01/26/2015 13:41   BMET    Component Value Date/Time   NA 148* 01/27/2015 0304   K 3.3* 01/27/2015 0304   CL 107 01/27/2015 0304   CO2 32 01/27/2015 0304   GLUCOSE 149* 01/27/2015 0304   BUN 115* 01/27/2015 0304   CREATININE 5.92* 01/27/2015 0304   CALCIUM 7.8* 01/27/2015 0304   GFRNONAA 9* 01/27/2015 0304   GFRAA 10* 01/27/2015 0304   CBC    Component Value Date/Time   WBC 7.1 01/25/2015 0235   RBC 4.06* 01/25/2015 0235   HGB 10.7* 01/25/2015 0235   HCT 32.2* 01/25/2015 0235   PLT 193 01/25/2015 0235   MCV 79.3 01/25/2015 0235   MCH 26.4 01/25/2015 0235   MCHC  33.2 01/25/2015 0235   RDW 16.4* 01/25/2015 0235   LYMPHSABS 0.5* 01/25/2015 0235   MONOABS 0.6 01/25/2015 0235   EOSABS 0.1 01/25/2015 0235   BASOSABS 0.0 01/25/2015 0235     Assessment: 1. Acute on CKD 3 secondary to what appears to be atrophic/ hydronephrotic Lt kidney, UTI, hypotension +/- volume depletion.  UO remains good and Scr cont to improve. 2. Hypokalemia 3. Met acidosis, improved   Plan: 1. His renal fx should cont to improve.  He is not a dialysis candidate 2. Replace K and cont IV fluids as he is not taking anything PO 3.  Will sign off, call if further assistance needed. Jamarien Rodkey T

## 2015-01-27 NOTE — Progress Notes (Signed)
Speech Language Pathology Treatment: Dysphagia  Patient Details Name: [name] MRN: [id number] DOB: 19-May-1946 Today's Date: 01/27/2015 Time: 0017-4944 SLP Time Calculation (min) (ACUTE ONLY): 38 min  Assessment / Plan / Recommendation Clinical Impression  Pt continues to hold each bolus orally, not initiating a swallow, despite max cues/prompts to do so.  Pt took a large sip of water via straw, but held it for 1 minute, then let is spill from his mouth onto his chest.  Pt did initiate one swallow with applesauce, but had most of the bolus still in his mouth on the left (suctioned by SLP).  Pt had hiccoughs throughout this session, which places him at even higher risk of aspiration while holding food/liquids orally.  Pt currently is a full code.  Question if family desires TF's verses changing code status.  Palliative care consult may be indicated if mental status does not improve over next few days.  Cont NPO.   HPI Other Pertinent Information: 69 yo male with significant medical hx including CVA, dementia, hydronephrosis, HTN, GERD, BPH, cardiomyopathy, gastroparesis, parayltic ileus, esophagitis adm with poor po intake, N/V x1 and recent treatment for UTI.  Pt resides at [facility] and is on a dysphagia diet at this facility.  Swallow evaluation ordered.   Pertinent Vitals Pain Assessment: No/denies pain  SLP Plan  Continue with current plan of care    Recommendations Diet recommendations: NPO Medication Administration: Via alternative means              Oral Care Recommendations: Oral care QID Follow up Recommendations: Skilled Nursing facility Plan: Continue with current plan of care    GO     Quinn Axe T 01/27/2015, 11:16 AM

## 2015-01-27 NOTE — Progress Notes (Signed)
CSW continuing to follow.   CSW visited pt room today and no family present at bedside.   CSW attempted to contact pt brother, [name] via telephone, but unable to reach and phone number did not have voice mail in order for CSW to leave message.   CSW contacted second emergency contact listed, [name] (niece) via telephone. CSW introduced self and explained role. Pt niece discussed that pt brother will likely be at hospital today to visit pt. Pt niece confirmed that pt is a resident at [facility] and pt is long term resident at facility. Pt niece confirmed that pt family plans for pt to return when medically ready. CSW discussed with pt niece that pt was transferred to room 1506 and d/c date is not yet determined.   CSW contacted [facility] and sent facility updated clinical information.   [place] confirmed that facility could accept pt back when medically stable.  CSW to continue to follow to provide support and assist with return to [facility] when medically stable for d/c.   Alison Murray, MSW, [place] Work [phone]

## 2015-01-27 NOTE — Progress Notes (Signed)
PROGRESS NOTE  [name] VPX:[id number] DOB: Oct 19, 1945 DOA: 01/24/2015 PCP: Merlene Laughter, MD  HPI/Recap of past 1 hours: 69 year old male with past history of stroke causing right-sided hemiplegia, dementia, cardiopathy with EF of 30%  and chronic kidney disease stage II with baseline creatinine around 1.4 brought in 5/10 for increasing confusion and patient found to be in acute renal failure with creatinine of 9.1 and BUN of 182. Other labs noted for normal white blood cell count, large urinary tract infection and metabolic acidosis. Patient started on bicarbonate drip, CT scan done noting some left-sided hydronephrosis, but no signs obstructing stone. By following morning, patient's renal function slowly improving.   Overnight no issues. Creatinine continues to come down very slowly. On 5/12, patient starting to wake up slightly. He lost IV access and critical-care placed central line. Continues to voice no complaints  Assessment/Plan: Active Problems:   Dementia with acute behavioral disturbance from encephalopathy from uremia. Starting to improve. Seen by speech therapy and not mentating clearly enough to pass swallow eval. Continue nothing by mouth  Hyperkalemia: Secondary to renal failure, has stopped patient's supplemental potassium. Received one dose of calcium carbonate. Now resolved    Hemiparesis affecting right side as late effect of stroke: Stable  Acute kidney injury in the setting of stage II chronic kidney disease: Looks to be prerenal from severe dehydration. Aggressively hydrate with bicarbonate drip. Urology and nephrology following. At this time, dialysis was not indicated and patient may not be dialysis candidate regardless.     Cardiomyopathy with chronic systolic heart failure: Per 2 D echo 10/22/13 EF 30-35% likely we will cause volume overload due to aggressive hydration. Monitoring respiratory function, which is currently normal    Acute  encephalopathy: Secondary to uremia    Metabolic acidosis: Secondary to uremia. Resolved   UTI (lower urinary tract infection): Patient also with chronic catheter, so difficult to distinguish if this is an acute infection   Code Status: Full code  Family Communication: Left message with family  Disposition Plan: Transfer to telemetry bed   Consultants:  Urology  Nephrology-signed off  Procedures:  None  Antibiotics:  IV Rocephin 5/10-present  Objective: BP 121/51 mmHg  Pulse 88  Temp(Src) 98.5 F (36.9 C) (Oral)  Resp 18  Ht 5\' 9"  (1.753 m)  Wt 80.5 kg (177 lb 7.5 oz)  BMI 26.20 kg/m2  SpO2 96%  Intake/Output Summary (Last 24 hours) at 01/27/15 1236 Last data filed at 01/27/15 1208  Gross per 24 hour  Intake   2210 ml  Output   1310 ml  Net    900 ml   Filed Weights   01/25/15 1255 01/26/15 0136 01/27/15 0311  Weight: 79.1 kg (174 lb 6.1 oz) 80 kg (176 lb 5.9 oz) 80.5 kg (177 lb 7.5 oz)    Exam:   General:  Somewhat more awake, still with acute delirium  Cardiovascular: Regular rate  and rhythm, S1-S2  Respiratory: clear to auscultation bilaterally  Abdomen: soft, nondistended, hypoactive bowel sounds  Musculoskeletal: no clubbing or cyanosis, trace pitting edema   Data Reviewed: Basic Metabolic Panel:  Recent Labs Lab 01/24/15 1912 01/24/15 2350 01/25/15 0235 01/26/15 0345 01/26/15 1558 01/27/15 0304  NA 141  --  148* 149* 151* 148*  K 6.0*  --  5.0 3.9 3.5 3.3*  CL 114*  --  114* 112* 109 107  CO2 12*  --  16* 25 32 32  GLUCOSE 204*  --  173* 146* 153* 149*  BUN 182*  --  157* 137* 125* 115*  CREATININE 9.10* 8.15* 7.96* 6.79* 6.44* 5.92*  CALCIUM 8.8*  --  8.4* 7.8* 8.0* 7.8*  MG 1.5*  --   --   --   --   --   PHOS 2.7  --   --  3.3  --   --    Liver Function Tests:  Recent Labs Lab 01/24/15 1912 01/25/15 0235 01/26/15 0345  AST 130* 98* 77*  ALT 193* 149* 118*  ALKPHOS 110 100 100  BILITOT 2.0* 1.9* 1.5*  PROT 6.1*  5.3* 5.2*  ALBUMIN 2.3* 2.0* 1.9*   No results for input(s): LIPASE, AMYLASE in the last 168 hours.  Recent Labs Lab 01/24/15 2350  AMMONIA 36*   CBC:  Recent Labs Lab 01/24/15 1912 01/24/15 2350 01/25/15 0235  WBC 8.4 9.2 7.1  NEUTROABS 7.0  --  5.9  HGB 12.0* 10.6* 10.7*  HCT 35.5* 31.9* 32.2*  MCV 79.8 79.2 79.3  PLT 193 181 193   Cardiac Enzymes:   No results for input(s): CKTOTAL, CKMB, CKMBINDEX, TROPONINI in the last 168 hours. BNP (last 3 results) No results for input(s): BNP in the last 8760 hours.  ProBNP (last 3 results) No results for input(s): PROBNP in the last 8760 hours.  CBG:  Recent Labs Lab 01/24/15 1824 01/24/15 2114 01/24/15 2257  GLUCAP 50* 91 119*    Recent Results (from the past 240 hour(s))  Culture, Urine     Status: None (Preliminary result)   Collection Time: 01/24/15  4:00 PM  Result Value Ref Range Status   Specimen Description URINE, CATHETERIZED  Final   Special Requests NONE  Final   Culture   Final    Culture reincubated for better growth Performed at [facility]    Report Status PENDING  Incomplete  MRSA PCR Screening     Status: Abnormal   Collection Time: 01/25/15 12:51 PM  Result Value Ref Range Status   MRSA by PCR POSITIVE (A) NEGATIVE Final    Comment:        The GeneXpert MRSA Assay (FDA approved for NASAL specimens only), is one component of a comprehensive MRSA colonization surveillance program. It is not intended to diagnose MRSA infection nor to guide or monitor treatment for MRSA infections. RESULT CALLED TO, READ BACK BY AND VERIFIED WITH: ARNOLD,A @ 3500 ON [id number] BY POTEAT,S      Studies: Dg Chest Port 1 View  01/26/2015   CLINICAL DATA:  Central line placement.  EXAM: PORTABLE CHEST - 1 VIEW  COMPARISON:  01/24/2015  FINDINGS: Central line tip is in the superior vena cava in good position just above the cavoatrial junction no pneumothorax. Cardiomegaly, unchanged. Was are clear.  Thoracolumbar scoliosis.  IMPRESSION: Central line in good position.  No acute abnormality.   Electronically Signed   By: Lorriane Shire M.D.   On: 01/26/2015 13:41    Scheduled Meds: . cefTRIAXone (ROCEPHIN)  IV  1 g Intravenous Q24H  . Chlorhexidine Gluconate Cloth  6 each Topical Q0600  . docusate sodium  100 mg Oral BID  . enoxaparin (LOVENOX) injection  30 mg Subcutaneous Q24H  . feeding supplement (ENSURE ENLIVE)  237 mL Oral BID BM  . ferrous sulfate  325 mg Oral BID WC  . mupirocin ointment  1 application Nasal BID  . potassium chloride  10 mEq Intravenous Q1 Hr x 3  . sodium chloride  3 mL Intravenous Q12H    Continuous  Infusions: . dextrose 125 mL/hr at 01/27/15 0700     Time spent: 15 min  [place] [facility] Pager [phone]. If 7PM-7AM, please contact night-coverage at [email or web address], password [facility] 01/27/2015, 12:36 PM  LOS: 3 days

## 2015-01-28 DIAGNOSIS — F0390 Unspecified dementia without behavioral disturbance: Secondary | ICD-10-CM

## 2015-01-28 LAB — URINE CULTURE: Colony Count: >=100000

## 2015-01-28 LAB — BASIC METABOLIC PANEL
Anion gap: 10 (ref 5–15)
BUN: 89 mg/dL — ABNORMAL HIGH (ref 6–20)
CO2: 28 mmol/L (ref 22–32)
Calcium: 7.9 mg/dL — ABNORMAL LOW (ref 8.9–10.3)
Chloride: 104 mmol/L (ref 101–111)
Creatinine, Ser: 4.95 mg/dL — ABNORMAL HIGH (ref 0.61–1.24)
GFR, EST AFRICAN AMERICAN: 13 mL/min — AB (ref >60)
GFR, EST NON AFRICAN AMERICAN: 11 mL/min — AB (ref >60)
GLUCOSE: 108 mg/dL — AB (ref 65–99)
Potassium: 3.6 mmol/L (ref 3.5–5.1)
SODIUM: 142 mmol/L (ref 135–145)

## 2015-01-28 MED ORDER — PIPERACILLIN-TAZOBACTAM 3.375 G IVPB
3.3750 g | Freq: Three times a day (TID) | INTRAVENOUS | Status: DC
  Administered 2015-01-28 – 2015-01-29 (×2): 3.375 g via INTRAVENOUS
  Filled 2015-01-28 (×3): qty 50

## 2015-01-28 NOTE — Progress Notes (Signed)
PROGRESS NOTE  [name] [id number]:[id number] DOB: 01-Jan-1946 DOA: 01/24/2015 PCP: Merlene Laughter, MD  HPI/Recap of past 4 hours: 69 year old male with past history of stroke causing right-sided hemiplegia, dementia, cardiopathy with EF of 30%  and chronic kidney disease stage II with baseline creatinine around 1.4 brought in 5/10 for increasing confusion and patient found to be in acute renal failure with creatinine of 9.1 and BUN of 182. Other labs noted for normal white blood cell count, large urinary tract infection and metabolic acidosis. Patient started on bicarbonate drip, CT scan done noting some left-sided hydronephrosis, but no signs obstructing stone. By following morning, patient's renal function slowly improving.    Creatinine continues to come down very slowly. On 5/12, patient starting to wake up slightly. He lost IV access and critical-care placed central line..  Transfer to floor on 5/13. Today, no complaints.  Still not alert enough to tolerate by mouth intake  Assessment/Plan: Active Problems:   Dementia with acute behavioral disturbance from encephalopathy from uremia. Starting to improve. Seen by speech therapy and not mentating clearly enough to pass swallow eval. Continue nothing by mouth  Hyperkalemia: Secondary to renal failure, has stopped patient's supplemental potassium. Received one dose of calcium carbonate. Now resolved    Hemiparesis affecting right side as late effect of stroke: Stable  Acute kidney injury in the setting of stage II chronic kidney disease: Looks to be prerenal from severe dehydration. Aggressively hydrate with bicarbonate drip initially and now IV fluids. Urology and nephrology following. At this time, dialysis was not indicated and patient may not be dialysis candidate regardless.   Hyponatremia: Resolved with IV fluids    Cardiomyopathy with chronic systolic heart failure: Per 2 D echo 10/22/13 EF 30-35% likely we will cause volume  overload due to aggressive hydration. Monitoring respiratory function, which is currently normal    Acute encephalopathy: Secondary to uremia    Metabolic acidosis: Secondary to uremia. Resolved   UTI (lower urinary tract infection): Patient also with chronic catheter, so difficult to distinguish if this is an acute infection . Stop antibiotics tomorrow after 5 days.  Code Status: Full code  Family Communication: Left message with family  Disposition Plan: Return back to skilled nursing facility likely in the next 48-72 hours once renal function near normalized and patient alert enough to take by mouth.   Consultants:  Urology  Nephrology-signed off  Procedures:  None  Antibiotics:  IV Rocephin 5/11-5/15  Objective: BP 132/69 mmHg  Pulse 86  Temp(Src) 98 F (36.7 C) (Oral)  Resp 18  Ht 5\' 9"  (1.753 m)  Wt 80.105 kg (176 lb 9.6 oz)  BMI 26.07 kg/m2  SpO2 100%  Intake/Output Summary (Last 24 hours) at 01/28/15 1254 Last data filed at 01/28/15 0843  Gross per 24 hour  Intake     40 ml  Output   1450 ml  Net  -1410 ml   Filed Weights   01/26/15 0136 01/27/15 0311 01/28/15 0500  Weight: 80 kg (176 lb 5.9 oz) 80.5 kg (177 lb 7.5 oz) 80.105 kg (176 lb 9.6 oz)    Exam:   General:  Awake, chronic dementia, no acute distress  Cardiovascular: Regular rate and rhythm, S1 S2  Respiratory: clear to auscultation bilaterally  Abdomen: soft, nondistended, hypoactive bowel sounds  Musculoskeletal: no clubbing or cyanosis, trace pitting edema   Data Reviewed: Basic Metabolic Panel:  Recent Labs Lab 01/24/15 1912  01/25/15 0235 01/26/15 0345 01/26/15 1558 01/27/15 0304 01/28/15 0530  NA 141  --  148* 149* 151* 148* 142  K 6.0*  --  5.0 3.9 3.5 3.3* 3.6  CL 114*  --  114* 112* 109 107 104  CO2 12*  --  16* 25 32 32 28  GLUCOSE 204*  --  173* 146* 153* 149* 108*  BUN 182*  --  157* 137* 125* 115* 89*  CREATININE 9.10*  < > 7.96* 6.79* 6.44* 5.92* 4.95*    CALCIUM 8.8*  --  8.4* 7.8* 8.0* 7.8* 7.9*  MG 1.5*  --   --   --   --   --   --   PHOS 2.7  --   --  3.3  --   --   --   < > = values in this interval not displayed. Liver Function Tests:  Recent Labs Lab 01/24/15 1912 01/25/15 0235 01/26/15 0345  AST 130* 98* 77*  ALT 193* 149* 118*  ALKPHOS 110 100 100  BILITOT 2.0* 1.9* 1.5*  PROT 6.1* 5.3* 5.2*  ALBUMIN 2.3* 2.0* 1.9*   No results for input(s): LIPASE, AMYLASE in the last 168 hours.  Recent Labs Lab 01/24/15 2350  AMMONIA 36*   CBC:  Recent Labs Lab 01/24/15 1912 01/24/15 2350 01/25/15 0235  WBC 8.4 9.2 7.1  NEUTROABS 7.0  --  5.9  HGB 12.0* 10.6* 10.7*  HCT 35.5* 31.9* 32.2*  MCV 79.8 79.2 79.3  PLT 193 181 193   Cardiac Enzymes:   No results for input(s): CKTOTAL, CKMB, CKMBINDEX, TROPONINI in the last 168 hours. BNP (last 3 results) No results for input(s): BNP in the last 8760 hours.  ProBNP (last 3 results) No results for input(s): PROBNP in the last 8760 hours.  CBG:  Recent Labs Lab 01/24/15 1824 01/24/15 2114 01/24/15 2257  GLUCAP 50* 91 119*    Recent Results (from the past 240 hour(s))  Culture, Urine     Status: None (Preliminary result)   Collection Time: 01/24/15  4:00 PM  Result Value Ref Range Status   Specimen Description URINE, CATHETERIZED  Final   Special Requests NONE  Final   Colony Count   Final    >=100,000 COLONIES/ML Performed at [facility]    Culture   Final    [place] Performed at [facility]    Report Status PENDING  Incomplete  MRSA PCR Screening     Status: Abnormal   Collection Time: 01/25/15 12:51 PM  Result Value Ref Range Status   MRSA by PCR POSITIVE (A) NEGATIVE Final    Comment:        The GeneXpert MRSA Assay (FDA approved for NASAL specimens only), is one component of a comprehensive MRSA colonization surveillance program. It is not intended to diagnose MRSA infection nor to guide or monitor treatment  for MRSA infections. RESULT CALLED TO, READ BACK BY AND VERIFIED WITH: ARNOLD,A @ 9528 ON [id number] BY POTEAT,S      Studies: No results found.  Scheduled Meds: . cefTRIAXone (ROCEPHIN)  IV  1 g Intravenous Q24H  . Chlorhexidine Gluconate Cloth  6 each Topical Q0600  . docusate sodium  100 mg Oral BID  . enoxaparin (LOVENOX) injection  30 mg Subcutaneous Q24H  . feeding supplement (ENSURE ENLIVE)  237 mL Oral BID BM  . ferrous sulfate  325 mg Oral BID WC  . mupirocin ointment  1 application Nasal BID  . sodium chloride  10-40 mL Intracatheter Q12H  . sodium chloride  3 mL Intravenous Q12H    Continuous Infusions: . dextrose 125 mL/hr at 01/28/15 0032     Time spent: 15 min  [place] [facility] Pager [phone]. If 7PM-7AM, please contact night-coverage at [email or web address], password [facility] 01/28/2015, 12:54 PM  LOS: 4 days

## 2015-01-28 NOTE — Progress Notes (Signed)
Speech Language Pathology Treatment: Dysphagia  Patient Details Name: [name] MRN: [id number] DOB: May 01, 1946 Today's Date: 01/28/2015 Time: 8850-2774 SLP Time Calculation (min) (ACUTE ONLY): 11 min  Assessment / Plan / Recommendation Clinical Impression  Pt has increased automaticity and decreased oral holding with PO trials presented today. Thin liquids and pureed solids did not elicit overt signs of aspiration across challenging. Recommend to initiate Dys 1 diet and thin liquids with full supervision. SLP to follow for tolerance.   HPI Other Pertinent Information: 69 yo male with significant medical hx including CVA, dementia, hydronephrosis, HTN, GERD, BPH, cardiomyopathy, gastroparesis, parayltic ileus, esophagitis adm with poor po intake, N/V x1 and recent treatment for UTI.  Pt resides at [facility] and is on a dysphagia diet at this facility.  Swallow evaluation ordered.   Pertinent Vitals Pain Assessment: No/denies pain  SLP Plan  Goals updated    Recommendations Diet recommendations: Thin liquid;Dysphagia 1 (puree) Liquids provided via: Straw Medication Administration: Crushed with puree Supervision: Patient able to self feed;Full supervision/cueing for compensatory strategies Compensations: Slow rate;Small sips/bites;Check for pocketing Postural Changes and/or Swallow Maneuvers: Seated upright 90 degrees;Upright 30-60 min after meal       Oral Care Recommendations: Oral care BID Follow up Recommendations: Skilled Nursing facility Plan: Goals updated    Germain Osgood, M.A. CCC-SLP [phone]  Germain Osgood 01/28/2015, 2:26 PM

## 2015-01-29 LAB — BASIC METABOLIC PANEL
ANION GAP: 10 (ref 5–15)
BUN: 70 mg/dL — ABNORMAL HIGH (ref 6–20)
CALCIUM: 8.1 mg/dL — AB (ref 8.9–10.3)
CO2: 26 mmol/L (ref 22–32)
CREATININE: 4.3 mg/dL — AB (ref 0.61–1.24)
Chloride: 103 mmol/L (ref 101–111)
GFR calc Af Amer: 15 mL/min — ABNORMAL LOW (ref >60)
GFR calc non Af Amer: 13 mL/min — ABNORMAL LOW (ref >60)
Glucose, Bld: 87 mg/dL (ref 65–99)
Potassium: 3.5 mmol/L (ref 3.5–5.1)
SODIUM: 139 mmol/L (ref 135–145)

## 2015-01-29 MED ORDER — PIPERACILLIN-TAZOBACTAM IN DEX 2-0.25 GM/50ML IV SOLN
2.2500 g | Freq: Three times a day (TID) | INTRAVENOUS | Status: DC
  Administered 2015-01-29 – 2015-01-30 (×4): 2.25 g via INTRAVENOUS
  Filled 2015-01-29 (×4): qty 50

## 2015-01-29 NOTE — Progress Notes (Signed)
PROGRESS NOTE  [name] SWF:[id number] DOB: 1946-09-16 DOA: 01/24/2015 PCP: Merlene Laughter, MD  HPI/Recap of past 31 hours: 69 year old male with past history of stroke causing right-sided hemiplegia, dementia, cardiopathy with EF of 30%  and chronic kidney disease stage II with baseline creatinine around 1.4 brought in 5/10 for increasing confusion and patient found to be in acute renal failure with creatinine of 9.1 and BUN of 182. Other labs noted for normal white blood cell count, large urinary tract infection and metabolic acidosis. Patient started on bicarbonate drip, CT scan done noting some left-sided hydronephrosis, but no signs obstructing stone. By following morning, patient's renal function slowly improving.    Creatinine continues to come down very slowly. On 5/12, patient starting to wake up slightly. He lost IV access and critical-care placed central line..  Transfer to floor on 5/13. Patient continues to have no complaints. Mentation improved enough that he's been able to start on diet  Assessment/Plan: Active Problems:   Dementia with acute behavioral disturbance from encephalopathy from uremia. Starting to improve. Now on dysphagia 1 diet  Hyperkalemia: Secondary to renal failure, has stopped patient's supplemental potassium. Received one dose of calcium carbonate. Now resolved    Hemiparesis affecting right side as late effect of stroke: Stable  Acute kidney injury in the setting of stage II chronic kidney disease: Looks to be prerenal from severe dehydration. Aggressively hydrate with bicarbonate drip initially and now IV fluids. Urology and nephrology following. At this time, dialysis was not indicated and patient may not be dialysis candidate regardless.   Continues to improve. Should be close to baseline within the next 24-48 hours  Hyponatremia: Resolved with IV fluids    Cardiomyopathy with chronic systolic heart failure: Per 2 D echo 10/22/13 EF 30-35%  likely we will cause volume overload due to aggressive hydration. Monitoring respiratory function, which is currently normal    Acute encephalopathy: Secondary to uremia    Metabolic acidosis: Secondary to uremia. Resolved   UTI (lower urinary tract infection): Patient also with chronic catheter, so difficult to distinguish if this is an acute infection . Stop antibiotics tomorrow after 5 days.  Code Status: Full code  Family Communication: Left message with family  Disposition Plan: Return back to skilled nursing facility likely in the next 48-72 hours once renal function near normalized   Consultants:  Urology  Nephrology-signed off  Procedures:  None  Antibiotics:  IV Rocephin 5/11-5/15  Objective: BP 143/65 mmHg  Pulse 82  Temp(Src) 98 F (36.7 C) (Oral)  Resp 18  Ht 5\' 9"  (1.753 m)  Wt 80.105 kg (176 lb 9.6 oz)  BMI 26.07 kg/m2  SpO2 98%  Intake/Output Summary (Last 24 hours) at 01/29/15 1340 Last data filed at 01/29/15 1048  Gross per 24 hour  Intake    120 ml  Output   1000 ml  Net   -880 ml   Filed Weights   01/26/15 0136 01/27/15 0311 01/28/15 0500  Weight: 80 kg (176 lb 5.9 oz) 80.5 kg (177 lb 7.5 oz) 80.105 kg (176 lb 9.6 oz)    Exam:   General:  Somnolent at times, but awakens easily, chronic dementia, no acute distress  Cardiovascular: Regular rate and rhythm, S1 S2  Respiratory: clear to auscultation bilaterally  Abdomen: soft, nondistended, hypoactive bowel sounds  Musculoskeletal: no clubbing or cyanosis, trace pitting edema   Data Reviewed: Basic Metabolic Panel:  Recent Labs Lab 01/24/15 1912  01/26/15 0345 01/26/15 1558 01/27/15 0304 01/28/15  0530 01/29/15 0515  NA 141  < > 149* 151* 148* 142 139  K 6.0*  < > 3.9 3.5 3.3* 3.6 3.5  CL 114*  < > 112* 109 107 104 103  CO2 12*  < > 25 32 32 28 26  GLUCOSE 204*  < > 146* 153* 149* 108* 87  BUN 182*  < > 137* 125* 115* 89* 70*  CREATININE 9.10*  < > 6.79* 6.44* 5.92* 4.95*  4.30*  CALCIUM 8.8*  < > 7.8* 8.0* 7.8* 7.9* 8.1*  MG 1.5*  --   --   --   --   --   --   PHOS 2.7  --  3.3  --   --   --   --   < > = values in this interval not displayed. Liver Function Tests:  Recent Labs Lab 01/24/15 1912 01/25/15 0235 01/26/15 0345  AST 130* 98* 77*  ALT 193* 149* 118*  ALKPHOS 110 100 100  BILITOT 2.0* 1.9* 1.5*  PROT 6.1* 5.3* 5.2*  ALBUMIN 2.3* 2.0* 1.9*   No results for input(s): LIPASE, AMYLASE in the last 168 hours.  Recent Labs Lab 01/24/15 2350  AMMONIA 36*   CBC:  Recent Labs Lab 01/24/15 1912 01/24/15 2350 01/25/15 0235  WBC 8.4 9.2 7.1  NEUTROABS 7.0  --  5.9  HGB 12.0* 10.6* 10.7*  HCT 35.5* 31.9* 32.2*  MCV 79.8 79.2 79.3  PLT 193 181 193   Cardiac Enzymes:   No results for input(s): CKTOTAL, CKMB, CKMBINDEX, TROPONINI in the last 168 hours. BNP (last 3 results) No results for input(s): BNP in the last 8760 hours.  ProBNP (last 3 results) No results for input(s): PROBNP in the last 8760 hours.  CBG:  Recent Labs Lab 01/24/15 1824 01/24/15 2114 01/24/15 2257  GLUCAP 50* 91 119*    Recent Results (from the past 240 hour(s))  Culture, Urine     Status: None   Collection Time: 01/24/15  4:00 PM  Result Value Ref Range Status   Specimen Description URINE, CATHETERIZED  Final   Special Requests NONE  Final   Colony Count   Final    >=100,000 COLONIES/ML Performed at [facility]    Culture   Final    PSEUDOMONAS AERUGINOSA ESCHERICHIA COLI Note: Confirmed Extended Spectrum Beta-Lactamase Producer (ESBL) Performed at [facility]    Report Status 01/28/2015 FINAL  Final   Organism ID, Bacteria PSEUDOMONAS AERUGINOSA  Final   Organism ID, Bacteria ESCHERICHIA COLI  Final      Susceptibility   Escherichia coli - MIC*    AMPICILLIN >=32 RESISTANT Resistant     CEFAZOLIN >=64 RESISTANT Resistant     CEFTRIAXONE >=64 RESISTANT Resistant     CIPROFLOXACIN >=4 RESISTANT Resistant     GENTAMICIN  <=1 SENSITIVE Sensitive     LEVOFLOXACIN >=8 RESISTANT Resistant     NITROFURANTOIN <=16 SENSITIVE Sensitive     TOBRAMYCIN <=1 SENSITIVE Sensitive     TRIMETH/SULFA <=20 SENSITIVE Sensitive     IMIPENEM <=0.25 SENSITIVE Sensitive     PIP/TAZO <=4 SENSITIVE Sensitive     * ESCHERICHIA COLI   Pseudomonas aeruginosa - MIC*    CEFEPIME 2 SENSITIVE Sensitive     CEFTAZIDIME 4 SENSITIVE Sensitive     CIPROFLOXACIN <=0.25 SENSITIVE Sensitive     GENTAMICIN <=1 SENSITIVE Sensitive     IMIPENEM >=16 RESISTANT Resistant     PIP/TAZO 8 SENSITIVE Sensitive     TOBRAMYCIN <=  1 SENSITIVE Sensitive     * PSEUDOMONAS AERUGINOSA  MRSA PCR Screening     Status: Abnormal   Collection Time: 01/25/15 12:51 PM  Result Value Ref Range Status   MRSA by PCR POSITIVE (A) NEGATIVE Final    Comment:        The GeneXpert MRSA Assay (FDA approved for NASAL specimens only), is one component of a comprehensive MRSA colonization surveillance program. It is not intended to diagnose MRSA infection nor to guide or monitor treatment for MRSA infections. RESULT CALLED TO, READ BACK BY AND VERIFIED WITH: ARNOLD,A @ 4037 ON [id number] BY POTEAT,S      Studies: No results found.  Scheduled Meds: . Chlorhexidine Gluconate Cloth  6 each Topical Q0600  . docusate sodium  100 mg Oral BID  . enoxaparin (LOVENOX) injection  30 mg Subcutaneous Q24H  . feeding supplement (ENSURE ENLIVE)  237 mL Oral BID BM  . ferrous sulfate  325 mg Oral BID WC  . mupirocin ointment  1 application Nasal BID  . piperacillin-tazobactam (ZOSYN)  IV  2.25 g Intravenous Q8H  . sodium chloride  10-40 mL Intracatheter Q12H  . sodium chloride  3 mL Intravenous Q12H    Continuous Infusions: . dextrose 125 mL/hr at 01/29/15 1221     Time spent: 15 min  [place] [facility] Pager [phone]. If 7PM-7AM, please contact night-coverage at [email or web address], password [facility] 01/29/2015, 1:40 PM  LOS: 5 days

## 2015-01-30 DIAGNOSIS — N179 Acute kidney failure, unspecified: Secondary | ICD-10-CM | POA: Diagnosis present

## 2015-01-30 LAB — BASIC METABOLIC PANEL
Anion gap: 10 (ref 5–15)
BUN: 57 mg/dL — AB (ref 6–20)
CHLORIDE: 100 mmol/L — AB (ref 101–111)
CO2: 24 mmol/L (ref 22–32)
CREATININE: 3.8 mg/dL — AB (ref 0.61–1.24)
Calcium: 7.8 mg/dL — ABNORMAL LOW (ref 8.9–10.3)
GFR calc Af Amer: 17 mL/min — ABNORMAL LOW (ref >60)
GFR calc non Af Amer: 15 mL/min — ABNORMAL LOW (ref >60)
GLUCOSE: 114 mg/dL — AB (ref 65–99)
POTASSIUM: 3.6 mmol/L (ref 3.5–5.1)
Sodium: 134 mmol/L — ABNORMAL LOW (ref 135–145)

## 2015-01-30 NOTE — Progress Notes (Signed)
Speech Language Pathology Treatment: Dysphagia  Patient Details Name: [name] MRN: [id number] DOB: Jul 19, 1946 Today's Date: 01/30/2015 Time: 3810-1751 SLP Time Calculation (min) (ACUTE ONLY): 22 min  Assessment / Plan / Recommendation Clinical Impression  Pt tolerating po intake well today - intake listed as 25% and NT reports good tolerance.  SLP trial of advancement in po with solids (graham cracker) - impaired munching mastication noted with excessive oral holding requiring SLP to provide icecream to facilitate oral clearance.   Pt with poor awareness to oral residuals which will increase aspiration risk with solids.  Good tolerance of dys1/thin = straw usage facilitated pt self feeding and maximizes his airway protection.     Recommend to continue current modified diet with strict precautions -assessing for oral residuals and encouraging pt to self feed.    SNF SLP tx indicated after dc to aid in managing pt's dysphagia.  Thanks for allowing me to help care for this pt.    HPI Other Pertinent Information: 69 yo male with significant medical hx including CVA, dementia, hydronephrosis, HTN, GERD, BPH, cardiomyopathy, gastroparesis, parayltic ileus, esophagitis adm with poor po intake, N/V x1 and recent treatment for UTI.  Pt resides at [facility] and is on a dysphagia diet at this facility.  Swallow evaluation ordered.   Pertinent Vitals Pain Assessment: No/denies pain  SLP Plan  Goals updated    Recommendations Diet recommendations: Thin liquid;Dysphagia 1 (puree) Liquids provided via: Cup;Straw Medication Administration: Crushed with puree Supervision: Patient able to self feed;Full supervision/cueing for compensatory strategies Compensations: Slow rate;Small sips/bites;Check for pocketing Postural Changes and/or Swallow Maneuvers: Seated upright 90 degrees;Upright 30-60 min after meal              Oral Care Recommendations: Oral care BID Follow up Recommendations: Skilled  Nursing facility Plan: Goals updated    [place], [place] [facility] SLP [phone]

## 2015-01-30 NOTE — Progress Notes (Signed)
PROGRESS NOTE  [name] WCH:[id number] DOB: 20-Dec-1945 DOA: 01/24/2015 PCP: Merlene Laughter, MD  HPI/Recap of past 41 hours: 69 year old male with past history of stroke causing right-sided hemiplegia, dementia, cardiopathy with EF of 30%  and chronic kidney disease stage II with baseline creatinine around 1.4 brought in 5/10 for increasing confusion and patient found to be in acute renal failure with creatinine of 9.1 and BUN of 182. Other labs noted for normal white blood cell count, large urinary tract infection and metabolic acidosis. Patient started on bicarbonate drip, CT scan done noting some left-sided hydronephrosis, but no signs obstructing stone. By following morning, patient's renal function slowly improving.    Creatinine continues to come down very slowly. On 5/12, patient starting to wake up slightly. He lost IV access and critical-care placed central line..  Transfer to floor on 5/13. Mentation improved enough that he's been able to start on diet.  Patient doing okay, continues to verbalize no complaints  Assessment/Plan: Active Problems:   Dementia with acute behavioral disturbance from encephalopathy from uremia. Starting to improve. Now on dysphagia 1 diet with thin liquids  Hyperkalemia: Secondary to renal failure, has stopped patient's supplemental potassium. Received one dose of calcium carbonate. Now resolved    Hemiparesis affecting right side as late effect of stroke: Stable  Acute kidney injury in the setting of stage II chronic kidney disease: Looks to be prerenal from severe dehydration. Aggressively hydrate with bicarbonate drip initially and now IV fluids. Urology and nephrology following. At this time, dialysis was not indicated and patient may not be dialysis candidate regardless.   Creatinine down to 3.8.  Hyponatremia: Resolved with IV fluids    Cardiomyopathy with chronic systolic heart failure: Per 2 D echo 10/22/13 EF 30-35% likely we will cause  volume overload due to aggressive hydration. Monitoring respiratory function, which is currently normal    Acute encephalopathy: Secondary to uremia    Metabolic acidosis: Secondary to uremia. Resolved   UTI (lower urinary tract infection): Patient also with chronic catheter, so difficult to distinguish if this is an acute infection . Stop antibiotics tomorrow after 5 days.  Code Status: Full code  Family Communication: Left message with family  Disposition Plan: Return back to skilled nursing facility likely in the next 24-48 hours once renal function near normalized   Consultants:  Urology  Nephrology-signed off  Procedures:  None  Antibiotics:  IV Rocephin 5/11-5/15  Objective: BP 112/60 mmHg  Pulse 83  Temp(Src) 98.4 F (36.9 C) (Oral)  Resp 16  Ht 5\' 9"  (1.753 m)  Wt 82.9 kg (182 lb 12.2 oz)  BMI 26.98 kg/m2  SpO2 97%  Intake/Output Summary (Last 24 hours) at 01/30/15 1234 Last data filed at 01/30/15 0951  Gross per 24 hour  Intake    320 ml  Output   1650 ml  Net  -1330 ml   Filed Weights   01/27/15 0311 01/28/15 0500 01/30/15 0414  Weight: 80.5 kg (177 lb 7.5 oz) 80.105 kg (176 lb 9.6 oz) 82.9 kg (182 lb 12.2 oz)    Exam: Unchanged from previous  General:  Somnolent at times, but awakens easily, chronic dementia, no acute distress  Cardiovascular: Regular rate and rhythm, S1 S2  Respiratory: clear to auscultation bilaterally  Abdomen: soft, nondistended, hypoactive bowel sounds  Musculoskeletal: no clubbing or cyanosis, trace pitting edema   Data Reviewed: Basic Metabolic Panel:  Recent Labs Lab 01/24/15 1912  01/26/15 0345 01/26/15 1558 01/27/15 0304 01/28/15 0530 01/29/15  0515 01/30/15 0411  NA 141  < > 149* 151* 148* 142 139 134*  K 6.0*  < > 3.9 3.5 3.3* 3.6 3.5 3.6  CL 114*  < > 112* 109 107 104 103 100*  CO2 12*  < > 25 32 32 28 26 24   GLUCOSE 204*  < > 146* 153* 149* 108* 87 114*  BUN 182*  < > 137* 125* 115* 89* 70* 57*    CREATININE 9.10*  < > 6.79* 6.44* 5.92* 4.95* 4.30* 3.80*  CALCIUM 8.8*  < > 7.8* 8.0* 7.8* 7.9* 8.1* 7.8*  MG 1.5*  --   --   --   --   --   --   --   PHOS 2.7  --  3.3  --   --   --   --   --   < > = values in this interval not displayed. Liver Function Tests:  Recent Labs Lab 01/24/15 1912 01/25/15 0235 01/26/15 0345  AST 130* 98* 77*  ALT 193* 149* 118*  ALKPHOS 110 100 100  BILITOT 2.0* 1.9* 1.5*  PROT 6.1* 5.3* 5.2*  ALBUMIN 2.3* 2.0* 1.9*   No results for input(s): LIPASE, AMYLASE in the last 168 hours.  Recent Labs Lab 01/24/15 2350  AMMONIA 36*   CBC:  Recent Labs Lab 01/24/15 1912 01/24/15 2350 01/25/15 0235  WBC 8.4 9.2 7.1  NEUTROABS 7.0  --  5.9  HGB 12.0* 10.6* 10.7*  HCT 35.5* 31.9* 32.2*  MCV 79.8 79.2 79.3  PLT 193 181 193   Cardiac Enzymes:   No results for input(s): CKTOTAL, CKMB, CKMBINDEX, TROPONINI in the last 168 hours. BNP (last 3 results) No results for input(s): BNP in the last 8760 hours.  ProBNP (last 3 results) No results for input(s): PROBNP in the last 8760 hours.  CBG:  Recent Labs Lab 01/24/15 1824 01/24/15 2114 01/24/15 2257  GLUCAP 50* 91 119*    Recent Results (from the past 240 hour(s))  Culture, Urine     Status: None   Collection Time: 01/24/15  4:00 PM  Result Value Ref Range Status   Specimen Description URINE, CATHETERIZED  Final   Special Requests NONE  Final   Colony Count   Final    >=100,000 COLONIES/ML Performed at [facility]    Culture   Final    PSEUDOMONAS AERUGINOSA ESCHERICHIA COLI Note: Confirmed Extended Spectrum Beta-Lactamase Producer (ESBL) Performed at [facility]    Report Status 01/28/2015 FINAL  Final   Organism ID, Bacteria PSEUDOMONAS AERUGINOSA  Final   Organism ID, Bacteria ESCHERICHIA COLI  Final      Susceptibility   Escherichia coli - MIC*    AMPICILLIN >=32 RESISTANT Resistant     CEFAZOLIN >=64 RESISTANT Resistant     CEFTRIAXONE >=64 RESISTANT  Resistant     CIPROFLOXACIN >=4 RESISTANT Resistant     GENTAMICIN <=1 SENSITIVE Sensitive     LEVOFLOXACIN >=8 RESISTANT Resistant     NITROFURANTOIN <=16 SENSITIVE Sensitive     TOBRAMYCIN <=1 SENSITIVE Sensitive     TRIMETH/SULFA <=20 SENSITIVE Sensitive     IMIPENEM <=0.25 SENSITIVE Sensitive     PIP/TAZO <=4 SENSITIVE Sensitive     * ESCHERICHIA COLI   Pseudomonas aeruginosa - MIC*    CEFEPIME 2 SENSITIVE Sensitive     CEFTAZIDIME 4 SENSITIVE Sensitive     CIPROFLOXACIN <=0.25 SENSITIVE Sensitive     GENTAMICIN <=1 SENSITIVE Sensitive     IMIPENEM >=16  RESISTANT Resistant     PIP/TAZO 8 SENSITIVE Sensitive     TOBRAMYCIN <=1 SENSITIVE Sensitive     * PSEUDOMONAS AERUGINOSA  MRSA PCR Screening     Status: Abnormal   Collection Time: 01/25/15 12:51 PM  Result Value Ref Range Status   MRSA by PCR POSITIVE (A) NEGATIVE Final    Comment:        The GeneXpert MRSA Assay (FDA approved for NASAL specimens only), is one component of a comprehensive MRSA colonization surveillance program. It is not intended to diagnose MRSA infection nor to guide or monitor treatment for MRSA infections. RESULT CALLED TO, READ BACK BY AND VERIFIED WITH: ARNOLD,A @ 1245 ON [id number] BY POTEAT,S      Studies: No results found.  Scheduled Meds: . Chlorhexidine Gluconate Cloth  6 each Topical Q0600  . docusate sodium  100 mg Oral BID  . enoxaparin (LOVENOX) injection  30 mg Subcutaneous Q24H  . feeding supplement (ENSURE ENLIVE)  237 mL Oral BID BM  . ferrous sulfate  325 mg Oral BID WC  . piperacillin-tazobactam (ZOSYN)  IV  2.25 g Intravenous Q8H  . sodium chloride  10-40 mL Intracatheter Q12H  . sodium chloride  3 mL Intravenous Q12H    Continuous Infusions: . dextrose 125 mL/hr at 01/30/15 0558     Time spent: 15 min  [place] [facility] Pager [phone]. If 7PM-7AM, please contact night-coverage at [email or web address], password [facility] 01/30/2015, 12:34 PM  LOS: 6 days

## 2015-01-31 LAB — BASIC METABOLIC PANEL
ANION GAP: 9 (ref 5–15)
BUN: 43 mg/dL — ABNORMAL HIGH (ref 6–20)
CALCIUM: 7.8 mg/dL — AB (ref 8.9–10.3)
CO2: 23 mmol/L (ref 22–32)
Chloride: 100 mmol/L — ABNORMAL LOW (ref 101–111)
Creatinine, Ser: 3.27 mg/dL — ABNORMAL HIGH (ref 0.61–1.24)
GFR calc Af Amer: 21 mL/min — ABNORMAL LOW (ref >60)
GFR calc non Af Amer: 18 mL/min — ABNORMAL LOW (ref >60)
Glucose, Bld: 132 mg/dL — ABNORMAL HIGH (ref 65–99)
Potassium: 3.4 mmol/L — ABNORMAL LOW (ref 3.5–5.1)
Sodium: 132 mmol/L — ABNORMAL LOW (ref 135–145)

## 2015-01-31 NOTE — Progress Notes (Signed)
Initial Nutrition Assessment  DOCUMENTATION CODES:  Not applicable  INTERVENTION:  Ensure Enlive (each supplement provides 350kcal and 20 grams of protein)  NUTRITION DIAGNOSIS:  Biting/chewing difficulty related to dysphagia as evidenced by other (see comment) (need for dysphagia 1 diet with prior NPO status).  GOAL:  Patient will meet greater than or equal to 90% of their needs  MONITOR:  PO intake, Supplement acceptance, Weight trends, Labs, I & O's  REASON FOR ASSESSMENT:  Low Braden    ASSESSMENT: Per MD note, 69 year old male with past history of stroke causing right-sided hemiplegia, dementia, cardiopathy with EF of 30% and chronic kidney disease stage II with baseline creatinine around 1.4 brought in 5/10 for increasing confusion and patient found to be in acute renal failure with creatinine of 9.1 and BUN of 182.  Pt seen for low Braden with BMI indicating overweight status. Pt was NPO 5/10-5/14 at which time diet was advanced to dysphagia 1 with thin liquids per SLP recommendation. Ensure Enlive has been ordered BID for pt. Per RN notes, pt is alert and oriented x1. When RD was in the room, pt made no attempt to communicate. No family or visitors in the room. Pt has been eating 10-50% of meals on average since diet advancement.  Unable to determine intakes or food preferences PTA. Noted one opened Ensure with 1-2 sips taken and one unopened Ensure in pt's room. Per chart review, pt has lost 13 lbs (7% body weight) in 7 months which is not significant for time frame. No muscle or fat wasting present during physical assessment.   Per notes, pt to d/c pending renal function improvement. He is not meeting needs at this time. Labs and medications reviewed. BUN/creatinine trending down, GFR: 21.  Height:  Ht Readings from Last 1 Encounters:  01/25/15 5\' 9"  (1.753 m)    Weight:  Wt Readings from Last 1 Encounters:  01/31/15 179 lb 11.2 oz (81.511 kg)    Ideal Body  Weight:     Wt Readings from Last 10 Encounters:  01/31/15 179 lb 11.2 oz (81.511 kg)  07/09/14 192 lb (87.091 kg)  06/07/14 192 lb (87.091 kg)  06/03/14 192 lb (87.091 kg)  04/19/14 192 lb (87.091 kg)  03/24/14 192 lb (87.091 kg)  02/28/14 192 lb (87.091 kg)  02/21/14 192 lb (87.091 kg)  02/17/14 192 lb (87.091 kg)  01/24/14 192 lb (87.091 kg)    BMI:  Body mass index is 26.52 kg/(m^2).  Estimated Nutritional Needs:  Kcal:  1600-1800  Protein:  80-95 grams  Fluid:  2-2.5L/day  Skin:  Wound (see comment)  Diet Order:  DIET - DYS 1 Room service appropriate?: Yes; Fluid consistency:: Thin  EDUCATION NEEDS:  No education needs identified at this time   Intake/Output Summary (Last 24 hours) at 01/31/15 1241 Last data filed at 01/31/15 0900  Gross per 24 hour  Intake    340 ml  Output   4100 ml  Net  -3760 ml    Last BM:  5/14   Jarome Matin, RD, LDN Inpatient Clinical Dietitian Pager # [phone] After hours/weekend pager # [phone]

## 2015-01-31 NOTE — Progress Notes (Signed)
PROGRESS NOTE  [name] FFM:[id number] DOB: 16-Sep-1946 DOA: 01/24/2015 PCP: Merlene Laughter, MD  HPI/Recap of past 86 hours: 69 year old male with past history of stroke causing right-sided hemiplegia, dementia, cardiopathy with EF of 30%  and chronic kidney disease stage II with baseline creatinine around 1.4 brought in 5/10 for increasing confusion and patient found to be in acute renal failure with creatinine of 9.1 and BUN of 182. Other labs noted for normal white blood cell count, large urinary tract infection and metabolic acidosis. Patient started on bicarbonate drip, CT scan done noting some left-sided hydronephrosis, but no signs obstructing stone. By following morning, patient's renal function slowly improving.    Creatinine continues to improve. On 5/12, patient starting to wake up slightly. He lost IV access and critical-care placed central line..  Transferred to floor on 5/13. Mentation improved enough that he's been able to start on diet.  He denies any complaints.  Assessment/Plan: Active Problems:   Dementia with acute behavioral disturbance from encephalopathy from uremia. Starting to improve. Now on dysphagia 1 diet with thin liquids  Hyperkalemia: Secondary to renal failure, has stopped patient's supplemental potassium. Received one dose of calcium carbonate. Now resolved    Hemiparesis affecting right side as late effect of stroke: Stable  Acute kidney injury in the setting of stage II chronic kidney disease: Looks to be prerenal from severe dehydration. Aggressively hydrate with bicarbonate drip initially and now IV fluids. Urology and nephrology following. At this time, dialysis was not indicated and patient may not be dialysis candidate regardless.   Creatinine down to 3.8.  Hyponatremia: Resolved with IV fluids    Cardiomyopathy with chronic systolic heart failure: Per 2 D echo 10/22/13 EF 30-35% likely we will cause volume overload due to aggressive  hydration. Monitoring respiratory function, which is currently normal. Following daily weights     Metabolic acidosis: Secondary to uremia. Resolved    UTI (lower urinary tract infection): Patient also with chronic catheter, so difficult to distinguish if this is an acute infection . Has completed 5 days of antibiotics..  Code Status: Full code  Family Communication: Left message with family  Disposition Plan: Return back to skilled nursing facility likely in the next 24-48 hours once renal function near normalized   Consultants:  Urology-signed off  Nephrology-signed off  Procedures:  None  Antibiotics:  IV Rocephin 5/11-5/15  Objective: BP 146/71 mmHg  Pulse 94  Temp(Src) 98.1 F (36.7 C) (Oral)  Resp 22  Ht 5\' 9"  (1.753 m)  Wt 81.511 kg (179 lb 11.2 oz)  BMI 26.52 kg/m2  SpO2 98%  Intake/Output Summary (Last 24 hours) at 01/31/15 1404 Last data filed at 01/31/15 0900  Gross per 24 hour  Intake    340 ml  Output   4100 ml  Net  -3760 ml   Filed Weights   01/28/15 0500 01/30/15 0414 01/31/15 0522  Weight: 80.105 kg (176 lb 9.6 oz) 82.9 kg (182 lb 12.2 oz) 81.511 kg (179 lb 11.2 oz)    Exam:   General:  Awakens easily, some somnolence. chronic dementia, no acute distress  Cardiovascular: Regular rate and rhythm, S1 S2  Respiratory: clear to auscultation bilaterally  Abdomen: soft, nondistended, hypoactive bowel sounds  Musculoskeletal: no clubbing or cyanosis, trace pitting edema   Data Reviewed: Basic Metabolic Panel:  Recent Labs Lab 01/24/15 1912  01/26/15 0345  01/27/15 0304 01/28/15 0530 01/29/15 0515 01/30/15 0411 01/31/15 0535  NA 141  < > 149*  < >  148* 142 139 134* 132*  K 6.0*  < > 3.9  < > 3.3* 3.6 3.5 3.6 3.4*  CL 114*  < > 112*  < > 107 104 103 100* 100*  CO2 12*  < > 25  < > 32 28 26 24 23   GLUCOSE 204*  < > 146*  < > 149* 108* 87 114* 132*  BUN 182*  < > 137*  < > 115* 89* 70* 57* 43*  CREATININE 9.10*  < > 6.79*  < >  5.92* 4.95* 4.30* 3.80* 3.27*  CALCIUM 8.8*  < > 7.8*  < > 7.8* 7.9* 8.1* 7.8* 7.8*  MG 1.5*  --   --   --   --   --   --   --   --   PHOS 2.7  --  3.3  --   --   --   --   --   --   < > = values in this interval not displayed. Liver Function Tests:  Recent Labs Lab 01/24/15 1912 01/25/15 0235 01/26/15 0345  AST 130* 98* 77*  ALT 193* 149* 118*  ALKPHOS 110 100 100  BILITOT 2.0* 1.9* 1.5*  PROT 6.1* 5.3* 5.2*  ALBUMIN 2.3* 2.0* 1.9*   No results for input(s): LIPASE, AMYLASE in the last 168 hours.  Recent Labs Lab 01/24/15 2350  AMMONIA 36*   CBC:  Recent Labs Lab 01/24/15 1912 01/24/15 2350 01/25/15 0235  WBC 8.4 9.2 7.1  NEUTROABS 7.0  --  5.9  HGB 12.0* 10.6* 10.7*  HCT 35.5* 31.9* 32.2*  MCV 79.8 79.2 79.3  PLT 193 181 193   Cardiac Enzymes:   No results for input(s): CKTOTAL, CKMB, CKMBINDEX, TROPONINI in the last 168 hours. BNP (last 3 results) No results for input(s): BNP in the last 8760 hours.  ProBNP (last 3 results) No results for input(s): PROBNP in the last 8760 hours.  CBG:  Recent Labs Lab 01/24/15 1824 01/24/15 2114 01/24/15 2257  GLUCAP 50* 91 119*    Recent Results (from the past 240 hour(s))  Culture, Urine     Status: None   Collection Time: 01/24/15  4:00 PM  Result Value Ref Range Status   Specimen Description URINE, CATHETERIZED  Final   Special Requests NONE  Final   Colony Count   Final    >=100,000 COLONIES/ML Performed at [facility]    Culture   Final    PSEUDOMONAS AERUGINOSA ESCHERICHIA COLI Note: Confirmed Extended Spectrum Beta-Lactamase Producer (ESBL) Performed at [facility]    Report Status 01/28/2015 FINAL  Final   Organism ID, Bacteria PSEUDOMONAS AERUGINOSA  Final   Organism ID, Bacteria ESCHERICHIA COLI  Final      Susceptibility   Escherichia coli - MIC*    AMPICILLIN >=32 RESISTANT Resistant     CEFAZOLIN >=64 RESISTANT Resistant     CEFTRIAXONE >=64 RESISTANT Resistant      CIPROFLOXACIN >=4 RESISTANT Resistant     GENTAMICIN <=1 SENSITIVE Sensitive     LEVOFLOXACIN >=8 RESISTANT Resistant     NITROFURANTOIN <=16 SENSITIVE Sensitive     TOBRAMYCIN <=1 SENSITIVE Sensitive     TRIMETH/SULFA <=20 SENSITIVE Sensitive     IMIPENEM <=0.25 SENSITIVE Sensitive     PIP/TAZO <=4 SENSITIVE Sensitive     * ESCHERICHIA COLI   Pseudomonas aeruginosa - MIC*    CEFEPIME 2 SENSITIVE Sensitive     CEFTAZIDIME 4 SENSITIVE Sensitive     CIPROFLOXACIN <=0.25  SENSITIVE Sensitive     GENTAMICIN <=1 SENSITIVE Sensitive     IMIPENEM >=16 RESISTANT Resistant     PIP/TAZO 8 SENSITIVE Sensitive     TOBRAMYCIN <=1 SENSITIVE Sensitive     * PSEUDOMONAS AERUGINOSA  MRSA PCR Screening     Status: Abnormal   Collection Time: 01/25/15 12:51 PM  Result Value Ref Range Status   MRSA by PCR POSITIVE (A) NEGATIVE Final    Comment:        The GeneXpert MRSA Assay (FDA approved for NASAL specimens only), is one component of a comprehensive MRSA colonization surveillance program. It is not intended to diagnose MRSA infection nor to guide or monitor treatment for MRSA infections. RESULT CALLED TO, READ BACK BY AND VERIFIED WITH: ARNOLD,A @ 3202 ON [id number] BY POTEAT,S      Studies: No results found.  Scheduled Meds: . docusate sodium  100 mg Oral BID  . enoxaparin (LOVENOX) injection  30 mg Subcutaneous Q24H  . feeding supplement (ENSURE ENLIVE)  237 mL Oral BID BM  . ferrous sulfate  325 mg Oral BID WC  . sodium chloride  10-40 mL Intracatheter Q12H  . sodium chloride  3 mL Intravenous Q12H    Continuous Infusions: . dextrose 125 mL/hr at 01/31/15 1031     Time spent: 15 min  [place] [facility] Pager [phone]. If 7PM-7AM, please contact night-coverage at [email or web address], password [facility] 01/31/2015, 2:04 PM  LOS: 7 days

## 2015-02-01 LAB — BASIC METABOLIC PANEL
ANION GAP: 8 (ref 5–15)
BUN: 33 mg/dL — ABNORMAL HIGH (ref 6–20)
CO2: 23 mmol/L (ref 22–32)
Calcium: 8.3 mg/dL — ABNORMAL LOW (ref 8.9–10.3)
Chloride: 102 mmol/L (ref 101–111)
Creatinine, Ser: 2.8 mg/dL — ABNORMAL HIGH (ref 0.61–1.24)
GFR, EST AFRICAN AMERICAN: 25 mL/min — AB (ref >60)
GFR, EST NON AFRICAN AMERICAN: 22 mL/min — AB (ref >60)
Glucose, Bld: 130 mg/dL — ABNORMAL HIGH (ref 65–99)
Potassium: 3.9 mmol/L (ref 3.5–5.1)
Sodium: 133 mmol/L — ABNORMAL LOW (ref 135–145)

## 2015-02-01 MED ORDER — METOPROLOL SUCCINATE ER 50 MG PO TB24: 50.0000 mg | ORAL_TABLET | Freq: Every day | ORAL | Status: AC

## 2015-02-01 NOTE — Progress Notes (Signed)
Patient for d/c today to SNF bed at [facility]. Family notified by phone of plan- plan transfer via EMS. Eduard Clos, MSW, Latanya Presser [phone]

## 2015-02-01 NOTE — Discharge Instructions (Signed)
Follow with Primary MD Merlene Laughter, MD in 7 days   Get CBC, CMP, 2 view Chest X ray checked  by Primary MD next visit.    Activity: As tolerated with Full fall precautions use walker/cane & assistance as needed   Disposition SNF   Diet: Dysphagia 1  , with feeding assistance and aspiration precautions.   Check your Weight same time everyday, if you gain over 2 pounds, or you develop in leg swelling, experience more shortness of breath or chest pain, call your Primary MD immediately. Follow Cardiac Low Salt Diet and 1.5 lit/day fluid restriction.   On your next visit with your primary care physician please Get Medicines reviewed and adjusted.   Please request your Prim.MD to go over all Hospital Tests and Procedure/Radiological results at the follow up, please get all Hospital records sent to your Prim MD by signing hospital release before you go home.   If you experience worsening of your admission symptoms, develop shortness of breath, life threatening emergency, suicidal or homicidal thoughts you must seek medical attention immediately by calling 911 or calling your MD immediately  if symptoms less severe.  You Must read complete instructions/literature along with all the possible adverse reactions/side effects for all the Medicines you take and that have been prescribed to you. Take any new Medicines after you have completely understood and accpet all the possible adverse reactions/side effects.   Do not drive, operating heavy machinery, perform activities at heights, swimming or participation in water activities or provide baby sitting services if your were admitted for syncope or siezures until you have seen by Primary MD or a Neurologist and advised to do so again.  Do not drive when taking Pain medications.    Do not take more than prescribed Pain, Sleep and Anxiety Medications  Special Instructions: If you have smoked or chewed Tobacco  in the last 2 yrs please stop  smoking, stop any regular Alcohol  and or any Recreational drug use.  Wear Seat belts while driving.   Please note  You were cared for by a hospitalist during your hospital stay. If you have any questions about your discharge medications or the care you received while you were in the hospital after you are discharged, you can call the unit and asked to speak with the hospitalist on call if the hospitalist that took care of you is not available. Once you are discharged, your primary care physician will handle any further medical issues. Please note that NO REFILLS for any discharge medications will be authorized once you are discharged, as it is imperative that you return to your primary care physician (or establish a relationship with a primary care physician if you do not have one) for your aftercare needs so that they can reassess your need for medications and monitor your lab values.

## 2015-02-01 NOTE — Progress Notes (Signed)
[name] to be D/C'd Skilled nursing facility per MD order.  Discussed prescriptions and follow up appointments with the patient. Prescriptions given to patient, medication list explained in detail. Pt verbalized understanding.    Medication List    STOP taking these medications        cloNIDine 0.2 MG tablet  Commonly known as:  CATAPRES     lactulose 10 GM/15ML solution  Commonly known as:  CHRONULAC     metoCLOPramide 10 MG tablet  Commonly known as:  REGLAN      TAKE these medications        amiodarone 200 MG tablet  Commonly known as:  PACERONE  Take 1 tablet (200 mg total) by mouth 2 (two) times daily.     aspirin 81 MG chewable tablet  Chew 81 mg by mouth daily.     atorvastatin 20 MG tablet  Commonly known as:  LIPITOR  Take 1 tablet (20 mg total) by mouth daily at 6 PM.     clopidogrel 75 MG tablet  Commonly known as:  PLAVIX  Take 1 tablet (75 mg total) by mouth daily with breakfast. PLEASE RESUME ON 01/28/14     docusate sodium 100 MG capsule  Commonly known as:  COLACE  Take 100 mg by mouth 2 (two) times daily.     ferrous sulfate 325 (65 FE) MG tablet  Take 325 mg by mouth 2 (two) times daily with a meal.     isosorbide-hydrALAZINE 20-37.5 MG per tablet  Commonly known as:  BIDIL  Take 1 tablet by mouth 3 (three) times daily.     lactose free nutrition Liqd  Take 237 mLs by mouth 2 (two) times daily between meals.     memantine 10 MG tablet  Commonly known as:  NAMENDA  Take 10 mg by mouth 2 (two) times daily.     metoprolol succinate 50 MG 24 hr tablet  Commonly known as:  TOPROL-XL  Take 1 tablet (50 mg total) by mouth daily. Take with or immediately following a meal.     pantoprazole 40 MG tablet  Commonly known as:  PROTONIX  Take 1 tablet (40 mg total) by mouth 2 (two) times daily before a meal.     polyethylene glycol packet  Commonly known as:  MIRALAX / GLYCOLAX  Take 17 g by mouth daily.     Polyvinyl Alcohol-Povidone 2.7-2  % Soln  Apply 1 drop to eye 4 (four) times daily.     tamsulosin 0.4 MG Caps capsule  Commonly known as:  FLOMAX  Take 0.4 mg by mouth daily.     venlafaxine XR 150 MG 24 hr capsule  Commonly known as:  EFFEXOR-XR  Take 150 mg by mouth daily with breakfast.        Filed Vitals:   02/01/15 0700  BP: 147/66  Pulse: 103  Temp: 98.7 F (37.1 C)  Resp: 20    Skin clean, dry and intact without evidence of skin break down, no evidence of skin tears noted. IV catheter discontinued intact. Site without signs and symptoms of complications. Dressing and pressure applied. Pt denies pain at this time. No complaints noted.  An After Visit Summary was printed and given to the patient. Patient escorted via [place], and D/C home via private auto.  [name] 02/01/2015 4:40 PM

## 2015-02-01 NOTE — Care Management Note (Signed)
Case Management Note  Patient Details  Name: ADRYEL WORTMANN MRN: [id number] Date of Birth: 1945/11/02  Subjective/Objective:           69 yo male admitted with AKI         Action/Plan:   CSW working with patient and family for SNF placement  Expected Discharge Date:   (unknown)               Expected Discharge Plan:  Skilled Nursing Facility  In-House Referral:  Clinical Social Work  Discharge planning Services  CM Consult  Post Acute Care Choice:  NA Choice offered to:  NA  DME Arranged:    DME Agency:     HH Arranged:    [place] Agency:     Status of Service:  Completed, signed off  Medicare Important Message Given:  Yes Date Medicare IM Given:  02/01/15 Medicare IM give by:  Leanne Chang Date Additional Medicare IM Given:    Additional Medicare Important Message give by:     If discussed at [place] of Stay Meetings, dates discussed:    Additional Comments:  Scot Dock, RN 02/01/2015, 11:46 AM

## 2015-02-01 NOTE — Discharge Summary (Signed)
[name], is a 69 y.o. male  DOB 12-22-1945  MRN [id number].  Admission date:  01/24/2015  Admitting Physician  Lavina Hamman, MD  Discharge Date:  02/01/2015   Primary MD  Merlene Laughter, MD  Recommendations for primary care physician for things to follow:   Closely monitor CBC and BMP. Must follow with urology outpatient.   Admission Diagnosis  Acute hyperkalemia [E87.5] Bilirubinemia [E80.6] Altered mental state [M41.58] Metabolic acidosis [X09.4] Uremia [N19] Abnormal EKG [R94.31] LFT elevation [R79.89] Acute renal failure, unspecified acute renal failure type [N17.9] Anemia, unspecified anemia type [D64.9]   Discharge Diagnosis  Acute hyperkalemia [E87.5] Bilirubinemia [E80.6] Altered mental state [M76.80] Metabolic acidosis [S81.1] Uremia [N19] Abnormal EKG [R94.31] LFT elevation [R79.89] Acute renal failure, unspecified acute renal failure type [N17.9] Anemia, unspecified anemia type [D64.9]     Principal Problem:   AKI (acute kidney injury) Active Problems:   Dementia   Hemiparesis affecting right side as late effect of stroke   Acute-on-chronic renal failure   Dyslipidemia   Cardiomyopathy: Per 2 D echo 10/22/13 EF 30-35%   ARF (acute renal failure)   Acute encephalopathy   Metabolic acidosis   UTI (lower urinary tract infection)   Renal failure (ARF), acute on chronic   Chronic systolic heart failure   Difficult intravenous access      Past Medical History  Diagnosis Date  . Dementia   . Hypertension   . Hyperlipemia   . GERD (gastroesophageal reflux disease)   . BPH (benign prostatic hyperplasia)   . Cardiomyopathy: Per 2 D echo 10/22/13 EF 30-35% 01/08/2014  . Hydronephrosis   . Stroke   . Hemiparesis     right hemiparesis  . Dysphasia   . Aphasia    expressive aphasia  . Hydronephrosis   . History of hiatal hernia   . Esophagitis   . Upper GI bleed   . Dysrhythmia     hx nonsustained vt  . Anemia   . CHF (congestive heart failure)   . Chronic indwelling Foley catheter   . Diabetes mellitus without complication     Past Surgical History  Procedure Laterality Date  . Esophagogastroduodenoscopy N/A 11/11/2012    Procedure: ESOPHAGOGASTRODUODENOSCOPY (EGD);  Surgeon: Lear Ng, MD;  Location: [facility] ENDOSCOPY;  Service: Endoscopy;  Laterality: N/A;  . Subxyphoid pericardial window N/A 11/13/2012    Procedure: SUBXYPHOID PERICARDIAL WINDOW;  Surgeon: Grace Isaac, MD;  Location: [facility];  Service: Thoracic;  Laterality: N/A;  . Esophagogastroduodenoscopy N/A 11/25/2012    Procedure: ESOPHAGOGASTRODUODENOSCOPY (EGD);  Surgeon: Winfield Cunas., MD;  Location: [facility] ENDOSCOPY;  Service: Endoscopy;  Laterality: N/A;  . Esophagogastroduodenoscopy N/A 01/13/2014    Procedure: ESOPHAGOGASTRODUODENOSCOPY (EGD);  Surgeon: Wonda Horner, MD;  Location: [facility] ENDOSCOPY;  Service: Endoscopy;  Laterality: N/A;  . Peg placement    . Aicd implantation         History of present illness and  Hospital Course:     Kindly see H&P for history of  present illness and admission details, please review complete Labs, Consult reports and Test reports for all details in brief  HPI  69 year old male with past history of stroke causing right-sided hemiplegia, dementia, cardiopathy with EF of 30% and chronic kidney disease stage II with baseline creatinine around 1.4 brought in 5/10 for increasing confusion and patient found to be in acute renal failure with creatinine of 9.1 and BUN of 182. Other labs noted for normal white blood cell count, large urinary tract infection and metabolic acidosis. Patient started on bicarbonate drip, CT scan done noting some left-sided hydronephrosis, but no signs obstructing stone. By following morning, patient's renal  function slowly improving.   Creatinine continues to improve. On 5/12, patient starting to wake up slightly. He lost IV access and critical-care placed central line.. Transferred to floor on 5/13. Mentation improved enough that he's been able to start on diet. He denies any complaints    Hospital Course    1. ARF and UTI with indwelling Foley catheter along with left-sided hydronephrosis on CT scan. Seen by both urology and nephrology, received adequate IV antibiotics for 5 days for treatment of UTI, renal function has significantly improved with hydration and continues to improve. Baseline creatinine appears to be around 1.5 with underlying CK D stage IV, request SNF staff and M.D. to monitor BMP closely and to keep patient well hydrated by oral intake. If redevelops renal failure due to poor oral intake than long-term care options should be considered like hospice.   2. Left-sided hydronephrosis. Seen by urology, follow outpatient with them. Has indwelling Foley catheter chronically. Continue.   3. Traumatic hypospadias - follow with urology closely, wound care and supportive care at nursing home.   4. Advance dementia. Also has underlying dysphagia, supportive care, on dysphagia 1 diet with full assistance and aspiration precautions.   5. Bilateral lateral renal cysts. Follow with cardiology outpatient.   6. CVA with resultant aphasia, hemiparesis, poor quality of life. Continue supportive care at SNF. Consider DO NOT RESUSCITATE and palliative care of declines further. For now continue aspirin, Plavix, statin as secondary prevention along with supportive care..   7. Ischemic cardiomyopathy with Chronic systolic and diastolic heart failure EF 35% on last echogram one year ago. Was dehydrated requiring IV fluids. Continue combination of aspirin, Plavix, statin, Imdur, hydralazine beta blocker for secondary prevention.   8. Dyslipidemia. On statin continue.   9. Hypertension.  Have reduced beta blocker from 200-50, discontinued Catapres, continue hydralazine and nitroglycerin. Monitor blood pressure closely and adjust medications as needed.    Discharge Condition: guarded   Follow UP  Follow-up Information    Follow up with Your SNF MD. Schedule an appointment as soon as possible for a visit in 3 days.   Why:  and your Heart doctor      Follow up with Alexis Frock, MD. Schedule an appointment as soon as possible for a visit in 3 days.   Specialty:  Urology   Why:  Hydronephrosis   Contact information:   [place] [address] [phone]         Discharge Instructions  and  Discharge Medications          Discharge Instructions    Discharge instructions    Complete by:  As directed   Follow with Primary MD Merlene Laughter, MD in 7 days   Get CBC, CMP, 2 view Chest X ray checked  by Primary MD next visit.    Activity:  As tolerated with Full fall precautions use walker/cane & assistance as needed   Disposition SNF   Diet: Dysphagia 1  , with feeding assistance and aspiration precautions.   Check your Weight same time everyday, if you gain over 2 pounds, or you develop in leg swelling, experience more shortness of breath or chest pain, call your Primary MD immediately. Follow Cardiac Low Salt Diet and 1.5 lit/day fluid restriction.   On your next visit with your primary care physician please Get Medicines reviewed and adjusted.   Please request your Prim.MD to go over all Hospital Tests and Procedure/Radiological results at the follow up, please get all Hospital records sent to your Prim MD by signing hospital release before you go home.   If you experience worsening of your admission symptoms, develop shortness of breath, life threatening emergency, suicidal or homicidal thoughts you must seek medical attention immediately by calling 911 or calling your MD immediately  if symptoms less severe.  You Must read  complete instructions/literature along with all the possible adverse reactions/side effects for all the Medicines you take and that have been prescribed to you. Take any new Medicines after you have completely understood and accpet all the possible adverse reactions/side effects.   Do not drive, operating heavy machinery, perform activities at heights, swimming or participation in water activities or provide baby sitting services if your were admitted for syncope or siezures until you have seen by Primary MD or a Neurologist and advised to do so again.  Do not drive when taking Pain medications.    Do not take more than prescribed Pain, Sleep and Anxiety Medications  Special Instructions: If you have smoked or chewed Tobacco  in the last 2 yrs please stop smoking, stop any regular Alcohol  and or any Recreational drug use.  Wear Seat belts while driving.   Please note  You were cared for by a hospitalist during your hospital stay. If you have any questions about your discharge medications or the care you received while you were in the hospital after you are discharged, you can call the unit and asked to speak with the hospitalist on call if the hospitalist that took care of you is not available. Once you are discharged, your primary care physician will handle any further medical issues. Please note that NO REFILLS for any discharge medications will be authorized once you are discharged, as it is imperative that you return to your primary care physician (or establish a relationship with a primary care physician if you do not have one) for your aftercare needs so that they can reassess your need for medications and monitor your lab values.     Increase activity slowly    Complete by:  As directed             Medication List    STOP taking these medications        cloNIDine 0.2 MG tablet  Commonly known as:  CATAPRES     lactulose 10 GM/15ML solution  Commonly known as:  CHRONULAC      metoCLOPramide 10 MG tablet  Commonly known as:  REGLAN      TAKE these medications        amiodarone 200 MG tablet  Commonly known as:  PACERONE  Take 1 tablet (200 mg total) by mouth 2 (two) times daily.     aspirin 81 MG chewable tablet  Chew 81 mg by mouth daily.     atorvastatin 20 MG tablet  Commonly known as:  LIPITOR  Take 1 tablet (20 mg total) by mouth daily at 6 PM.     clopidogrel 75 MG tablet  Commonly known as:  PLAVIX  Take 1 tablet (75 mg total) by mouth daily with breakfast. PLEASE RESUME ON 01/28/14     docusate sodium 100 MG capsule  Commonly known as:  COLACE  Take 100 mg by mouth 2 (two) times daily.     ferrous sulfate 325 (65 FE) MG tablet  Take 325 mg by mouth 2 (two) times daily with a meal.     isosorbide-hydrALAZINE 20-37.5 MG per tablet  Commonly known as:  BIDIL  Take 1 tablet by mouth 3 (three) times daily.     lactose free nutrition Liqd  Take 237 mLs by mouth 2 (two) times daily between meals.     memantine 10 MG tablet  Commonly known as:  NAMENDA  Take 10 mg by mouth 2 (two) times daily.     metoprolol succinate 50 MG 24 hr tablet  Commonly known as:  TOPROL-XL  Take 1 tablet (50 mg total) by mouth daily. Take with or immediately following a meal.     pantoprazole 40 MG tablet  Commonly known as:  PROTONIX  Take 1 tablet (40 mg total) by mouth 2 (two) times daily before a meal.     polyethylene glycol packet  Commonly known as:  MIRALAX / GLYCOLAX  Take 17 g by mouth daily.     Polyvinyl Alcohol-Povidone 2.7-2 % Soln  Apply 1 drop to eye 4 (four) times daily.     tamsulosin 0.4 MG Caps capsule  Commonly known as:  FLOMAX  Take 0.4 mg by mouth daily.     venlafaxine XR 150 MG 24 hr capsule  Commonly known as:  EFFEXOR-XR  Take 150 mg by mouth daily with breakfast.          Diet and Activity recommendation: See Discharge Instructions above   Consults obtained - nephrology, urology, PC CM   Major procedures and  Radiology Reports - PLEASE review detailed and final reports for all details, in brief -       Ct Abdomen Pelvis Wo Contrast  01/24/2015   CLINICAL DATA:  Acute renal failure. Abnormal labs. Urinary tract infection.  EXAM: CT ABDOMEN AND PELVIS WITHOUT CONTRAST  TECHNIQUE: Multidetector CT imaging of the abdomen and pelvis was performed following the standard protocol without IV contrast.  COMPARISON:  11/24/2012  FINDINGS: Small bilateral pleural effusions with basilar atelectasis. Small pericardial effusion. Esophageal hiatal hernia.  The gallbladder is distended without inflammatory infiltration. No radiopaque stones identified. No bile duct dilatation. Calcified granulomas in the spleen. The unenhanced appearance of the liver, pancreas, adrenal glands, inferior vena cava, and retroperitoneal lymph nodes is unremarkable. Calcification of abdominal aorta without aneurysm. Multiple exophytic lesions in the kidneys most likely representing cysts. Small hemorrhagic cyst in the left kidney. No significant changes since previous study. There is hydronephrosis and hydroureter on the left. No obstructing stone is demonstrated. This could represent a non radiopaque stone or reflux. Stomach, small bowel, and colon are not abnormally distended. Diffusely stool-filled colon. Small umbilical hernia containing a portion of small bowel. No proximal obstruction.  Pelvis: Appendix is normal. Prostate gland is not enlarged. There is a Foley catheter in the bladder. Gas in the bladder is likely due to catheter insertion. The bladder is decompressed but the bladder wall appears to be diffusely thickened, suggesting cystitis. Rectal wall is also thickened  suggesting proctitis. No free or loculated pelvic fluid collections. No pelvic mass or lymphadenopathy. Scoliosis and degenerative changes in the spine.  IMPRESSION: Diffuse wall thickening in the bladder suggest probable cystitis. Left hydronephrosis and hydroureter. No  obstructing stones demonstrated. This could indicate a non radiopaque stone versus reflux disease. Probable bilateral renal cysts. Diffuse thickening of the rectal wall suggesting proctitis. Small umbilical hernia containing small bowel but without obstruction.   Electronically Signed   By: Lucienne Capers M.D.   On: 01/24/2015 22:50   Dg Chest Port 1 View  01/26/2015   CLINICAL DATA:  Central line placement.  EXAM: PORTABLE CHEST - 1 VIEW  COMPARISON:  01/24/2015  FINDINGS: Central line tip is in the superior vena cava in good position just above the cavoatrial junction no pneumothorax. Cardiomegaly, unchanged. Was are clear. Thoracolumbar scoliosis.  IMPRESSION: Central line in good position.  No acute abnormality.   Electronically Signed   By: Lorriane Shire M.D.   On: 01/26/2015 13:41   Dg Chest Port 1 View  01/24/2015   CLINICAL DATA:  Unwitnessed fall this morning.  EXAM: PORTABLE CHEST - 1 VIEW  COMPARISON:  01/08/2014  FINDINGS: There is moderate unchanged cardiomegaly. There is no confluent airspace consolidation. There is no large effusion. Hilar and mediastinal contours are unremarkable and unchanged.  IMPRESSION: Cardiomegaly.  No acute cardiopulmonary findings.   Electronically Signed   By: Andreas Newport M.D.   On: 01/24/2015 20:26    Micro Results      Recent Results (from the past 240 hour(s))  Culture, Urine     Status: None   Collection Time: 01/24/15  4:00 PM  Result Value Ref Range Status   Specimen Description URINE, CATHETERIZED  Final   Special Requests NONE  Final   Colony Count   Final    >=100,000 COLONIES/ML Performed at [facility]    Culture   Final    PSEUDOMONAS AERUGINOSA ESCHERICHIA COLI Note: Confirmed Extended Spectrum Beta-Lactamase Producer (ESBL) Performed at [facility]    Report Status 01/28/2015 FINAL  Final   Organism ID, Bacteria PSEUDOMONAS AERUGINOSA  Final   Organism ID, Bacteria ESCHERICHIA COLI  Final       Susceptibility   Escherichia coli - MIC*    AMPICILLIN >=32 RESISTANT Resistant     CEFAZOLIN >=64 RESISTANT Resistant     CEFTRIAXONE >=64 RESISTANT Resistant     CIPROFLOXACIN >=4 RESISTANT Resistant     GENTAMICIN <=1 SENSITIVE Sensitive     LEVOFLOXACIN >=8 RESISTANT Resistant     NITROFURANTOIN <=16 SENSITIVE Sensitive     TOBRAMYCIN <=1 SENSITIVE Sensitive     TRIMETH/SULFA <=20 SENSITIVE Sensitive     IMIPENEM <=0.25 SENSITIVE Sensitive     PIP/TAZO <=4 SENSITIVE Sensitive     * ESCHERICHIA COLI   Pseudomonas aeruginosa - MIC*    CEFEPIME 2 SENSITIVE Sensitive     CEFTAZIDIME 4 SENSITIVE Sensitive     CIPROFLOXACIN <=0.25 SENSITIVE Sensitive     GENTAMICIN <=1 SENSITIVE Sensitive     IMIPENEM >=16 RESISTANT Resistant     PIP/TAZO 8 SENSITIVE Sensitive     TOBRAMYCIN <=1 SENSITIVE Sensitive     * PSEUDOMONAS AERUGINOSA  MRSA PCR Screening     Status: Abnormal   Collection Time: 01/25/15 12:51 PM  Result Value Ref Range Status   MRSA by PCR POSITIVE (A) NEGATIVE Final    Comment:        The GeneXpert MRSA Assay (FDA approved for  NASAL specimens only), is one component of a comprehensive MRSA colonization surveillance program. It is not intended to diagnose MRSA infection nor to guide or monitor treatment for MRSA infections. RESULT CALLED TO, READ BACK BY AND VERIFIED WITH: ARNOLD,A @ 1442 ON [id number] BY POTEAT,S        Today   Subjective:   Keiji Melland today in bed appears comfortable, will not answer questions or follow commands.  Objective:   Blood pressure 147/66, pulse 103, temperature 98.7 F (37.1 C), temperature source Axillary, resp. rate 20, height 5\' 9"  (1.753 m), weight 81.511 kg (179 lb 11.2 oz), SpO2 96 %.   Intake/Output Summary (Last 24 hours) at 02/01/15 1144 Last data filed at 02/01/15 0850  Gross per 24 hour  Intake   1881 ml  Output   3100 ml  Net  -1219 ml    Exam Awake, but nonverbal and will not answer questions or follow  commands,  [place].AT,PERRAL Supple Neck,No JVD, No cervical lymphadenopathy appriciated.  Symmetrical Chest wall movement, Good air movement bilaterally, CTAB RRR,No Gallops,Rubs or new Murmurs, No Parasternal Heave +ve B.Sounds, Abd Soft, Non tender, No organomegaly appriciated, No rebound -guarding or rigidity. Foley catheter in place. No Cyanosis, Clubbing or edema, No new Rash or bruise  Data Review   CBC w Diff:  Lab Results  Component Value Date   WBC 7.1 01/25/2015   HGB 10.7* 01/25/2015   HCT 32.2* 01/25/2015   PLT 193 01/25/2015   LYMPHOPCT 7* 01/25/2015   MONOPCT 8 01/25/2015   EOSPCT 2 01/25/2015   BASOPCT 0 01/25/2015    CMP:  Lab Results  Component Value Date   NA 133* 02/01/2015   K 3.9 02/01/2015   CL 102 02/01/2015   CO2 23 02/01/2015   BUN 33* 02/01/2015   CREATININE 2.80* 02/01/2015   PROT 5.2* 01/26/2015   ALBUMIN 1.9* 01/26/2015   BILITOT 1.5* 01/26/2015   ALKPHOS 100 01/26/2015   AST 77* 01/26/2015   ALT 118* 01/26/2015  .   Total Time in preparing paper work, data evaluation and todays exam - 35 minutes  Thurnell Lose M.D on 02/01/2015 at 11:44 AM  [facility]   Office  [phone]

## 2015-02-02 NOTE — Telephone Encounter (Signed)
Close encounter 

## 2015-02-03 ENCOUNTER — Ambulatory Visit: Payer: Medicare Other | Admitting: Cardiovascular Disease

## 2015-02-08 IMAGING — CT CT ABD-PELV W/O CM
4 of 7 series · 14 of 32 positions shown, 18 images · non-contrast
Comparison: 11/22/2012 radiographs, 11/10/2012 CT

CLINICAL DATA: Abdominal pain

CT ABDOMEN AND PELVIS WITHOUT CONTRAST
TECHNIQUE: Multidetector CT imaging of the abdomen and pelvis was
performed following the standard protocol without intravenous
contrast.

[Series 2: routine abdomen · axial · 0.89mm/px · z∈[-280,-65]mm · 3 of 87 slices shown, 7 images]
[im 22/87  soft-tissue]
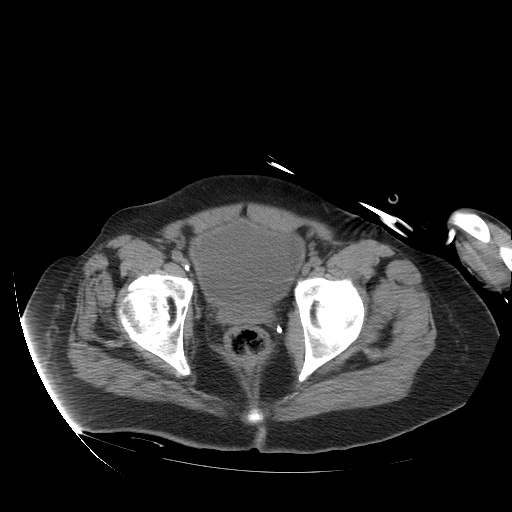
[im 22/87  lung]
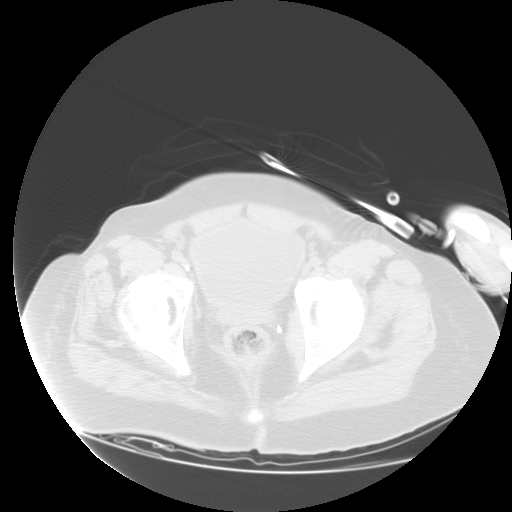
[im 22/87  bone]
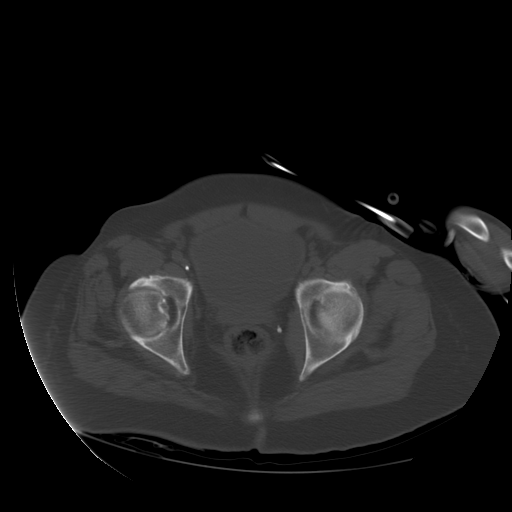
[im 44/87  soft-tissue]
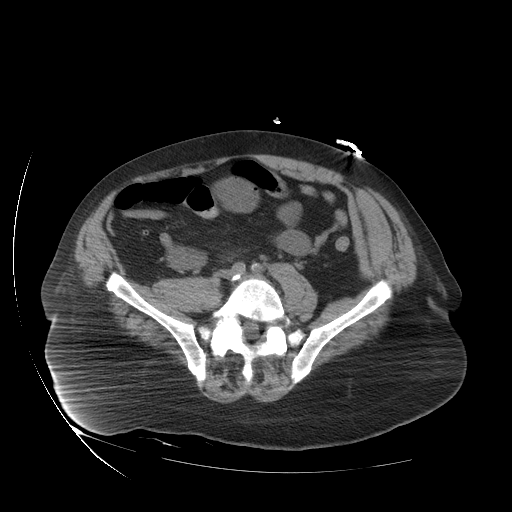
[im 44/87  lung]
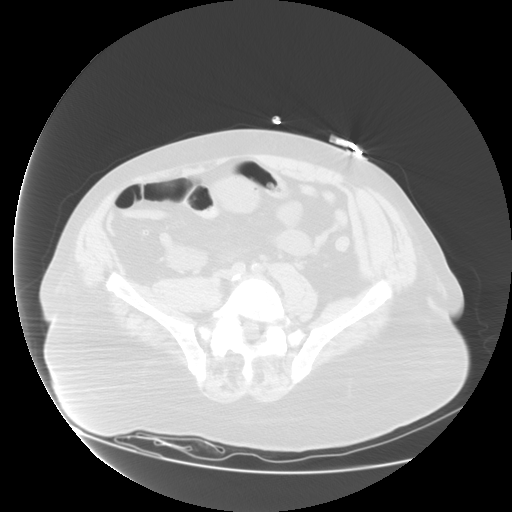
[im 65/87  soft-tissue]
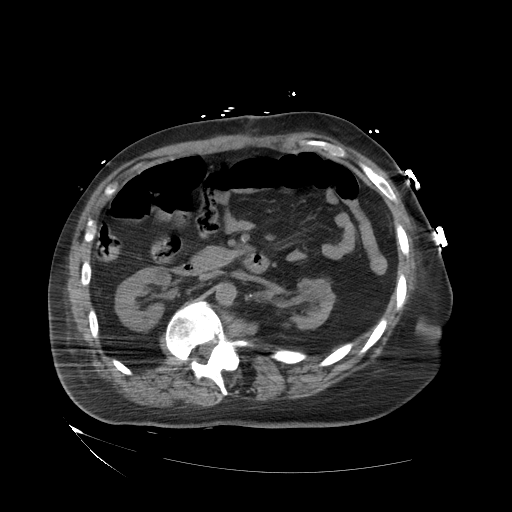
[im 65/87  lung]
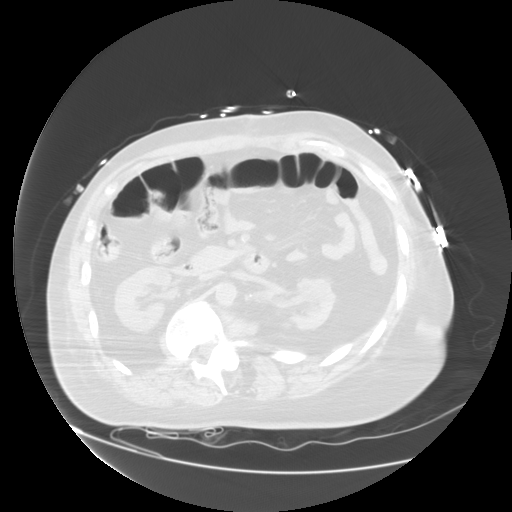

[Series 400: cor · coronal · 0.89mm/px · 2 of 99 slices shown]
[im 25/99  soft-tissue]
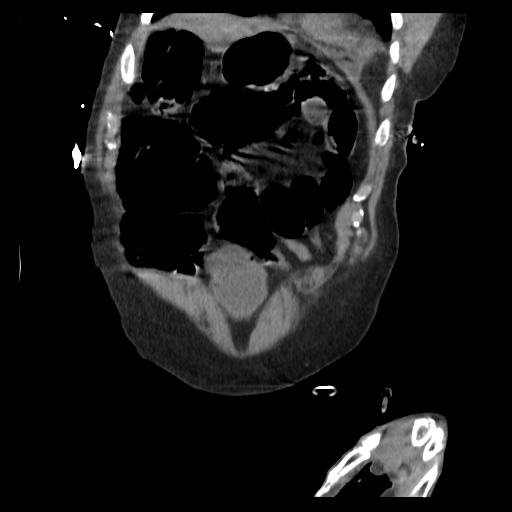
[im 50/99  soft-tissue]
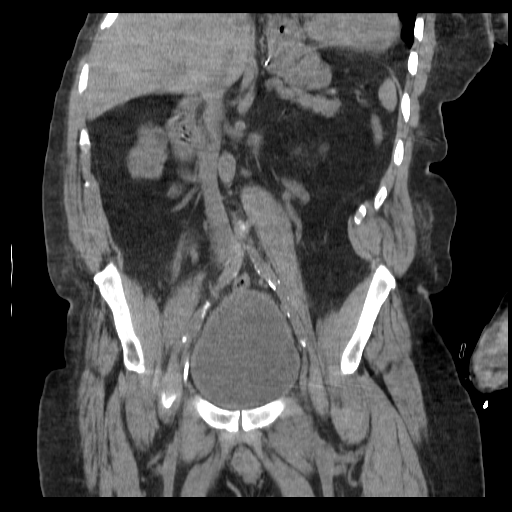

[Series 401: sag · sagittal · 0.89mm/px · 4 of 121 slices shown (1 of 2)]
[im 25/121  soft-tissue]
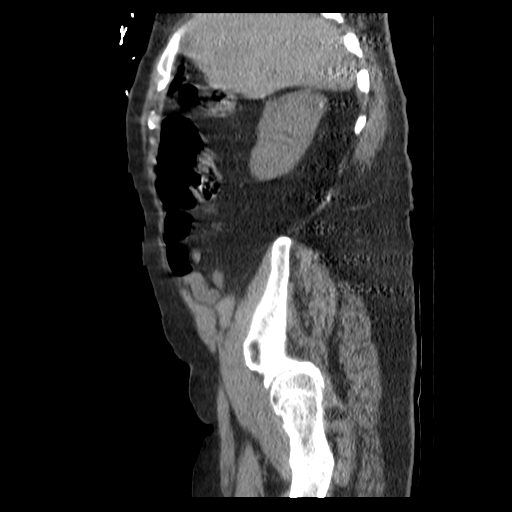
[im 49/121  soft-tissue]
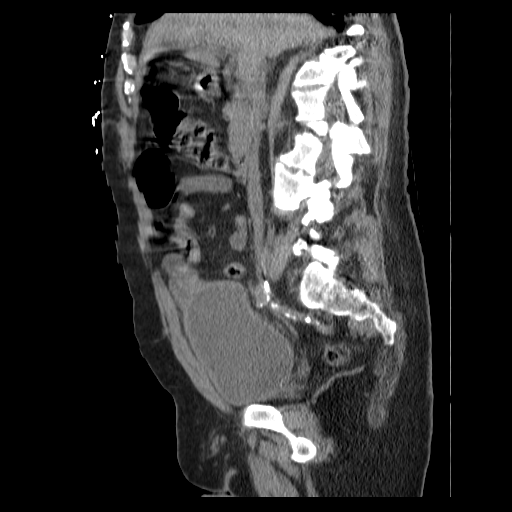
[im 73/121  soft-tissue]
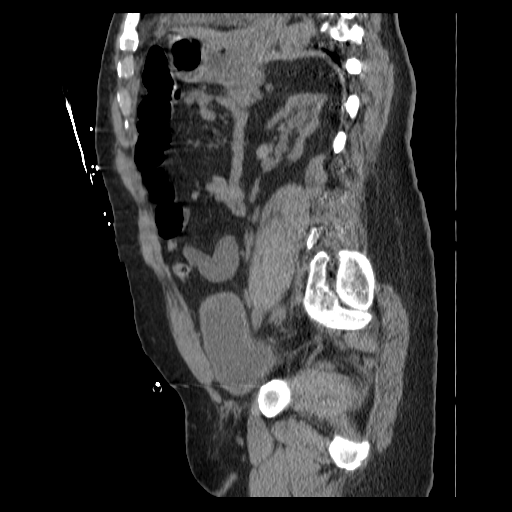
[im 97/121  soft-tissue]
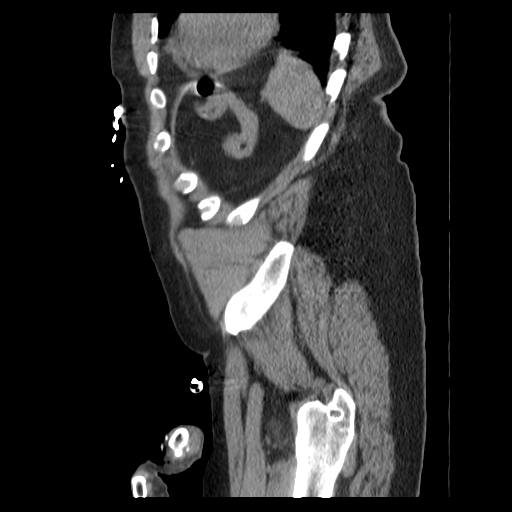

[Series 700: sag · sagittal · 0.89mm/px · 5 of 125 slices shown (2 of 2)]
[im 21/125  soft-tissue]
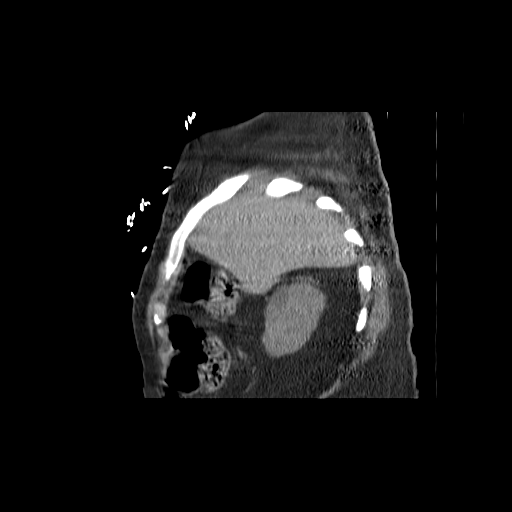
[im 42/125  soft-tissue]
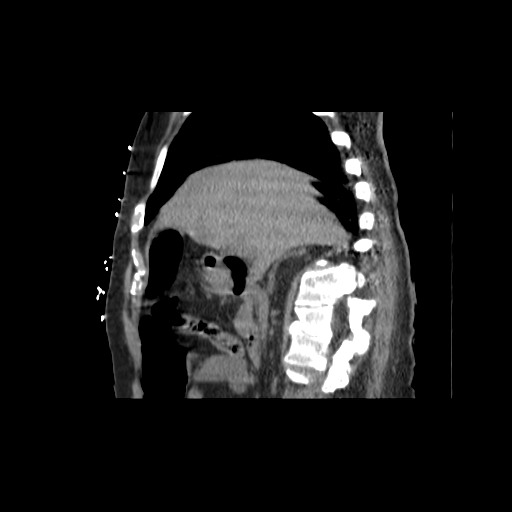
[im 63/125  soft-tissue]
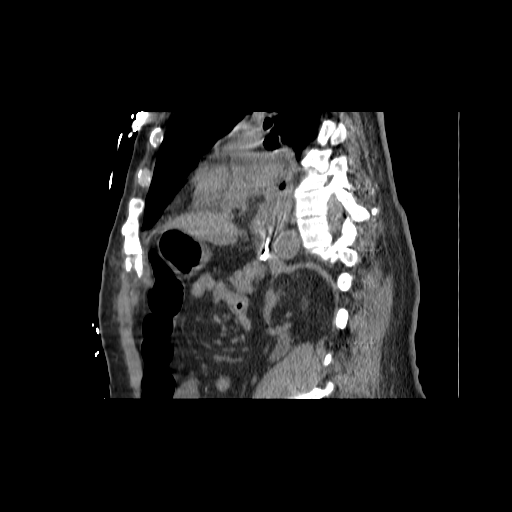
[im 83/125  soft-tissue]
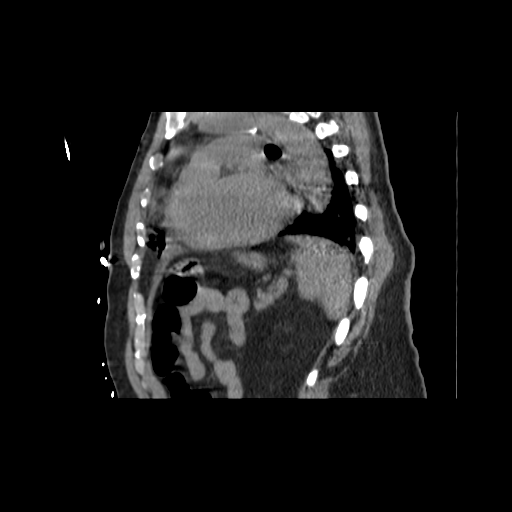
[im 104/125  soft-tissue]
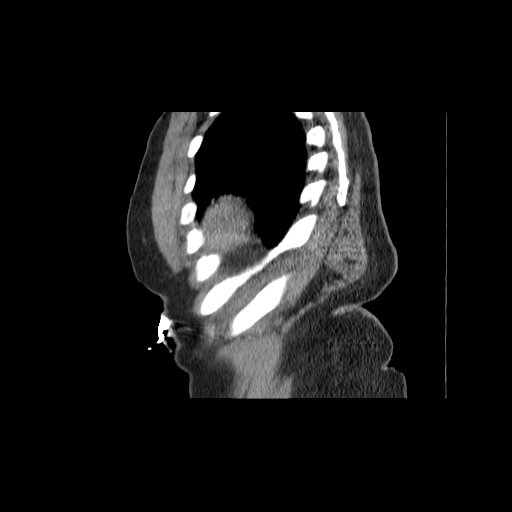

[14 of 32 positions shown; findings below may reference images not displayed]

FINDINGS: Hiatal hernia.  NG tube terminates at the level of the
hiatus.  Mild bibasilar opacities.  Heart size upper normal to
mildly enlarged. Pericardial effusion has decreased in size, with
minimal residual.  There is a small amount of mediastinal air.
Correlate clinically for interval pericardiocentesis.

Organ abnormality/lesion detection is limited in the absence of
intravenous contrast. Within this limitation, unremarkable liver,
spleen, pancreas, adrenal glands, biliary system.

Lobular renal contours with incompletely characterized lesions,
similar to recent prior.  The hydroureteronephrosis has
decreased/resolved on the left.

No CT evidence for colitis. Nonspecific rectal wall thickening,
similar to prior.

Small bowel loops are relatively decompressed.  Normal appendix.
No free intraperitoneal air or fluid.  No lymphadenopathy.

There is scattered atherosclerotic calcification of the aorta and
its branches. No aneurysmal dilatation.

Thin-walled bladder.

Multilevel degenerative changes of the imaged spine. No acute or
aggressive appearing osseous lesion.  Thoracolumbar curvature is
unchanged.

There is a right femoral approach central venous catheter with tip
near the right common iliac confluence.
IMPRESSION: Interval decrease in size of the pericardial effusion with
development of a small amount of mediastinal air.  Correlate
clinically for interval drainage.

Hiatal hernia.  NG tube tip just below the hiatus.

Bowel loops are relatively decompressed, without evidence for
obstruction.

Interval decreased dilatation of the left renal collecting system,
with resolution of the hydroureteronephrosis.

Bilateral incompletely characterized renal lesions.

Nonspecific rectal wall thickening, similar to prior.

## 2015-04-17 DEATH — deceased
